# Patient Record
Sex: Male | Born: 1950
Health system: Southern US, Community
[De-identification: ages and names within clinical notes are randomized; demographics above are authoritative.]

## PROBLEM LIST (undated history)

## (undated) ENCOUNTER — Emergency Department (HOSPITAL_BASED_OUTPATIENT_CLINIC_OR_DEPARTMENT_OTHER): Admission: EM | Source: Home / Self Care

## (undated) DIAGNOSIS — I2510 Atherosclerotic heart disease of native coronary artery without angina pectoris: Secondary | ICD-10-CM

## (undated) DIAGNOSIS — Z87442 Personal history of urinary calculi: Secondary | ICD-10-CM

## (undated) DIAGNOSIS — J40 Bronchitis, not specified as acute or chronic: Secondary | ICD-10-CM

## (undated) DIAGNOSIS — E785 Hyperlipidemia, unspecified: Secondary | ICD-10-CM

## (undated) DIAGNOSIS — J189 Pneumonia, unspecified organism: Secondary | ICD-10-CM

## (undated) DIAGNOSIS — Z803 Family history of malignant neoplasm of breast: Secondary | ICD-10-CM

## (undated) DIAGNOSIS — I10 Essential (primary) hypertension: Secondary | ICD-10-CM

## (undated) DIAGNOSIS — Z800 Family history of malignant neoplasm of digestive organs: Secondary | ICD-10-CM

## (undated) DIAGNOSIS — Z8051 Family history of malignant neoplasm of kidney: Secondary | ICD-10-CM

## (undated) DIAGNOSIS — C61 Malignant neoplasm of prostate: Secondary | ICD-10-CM

## (undated) HISTORY — PX: HERNIA REPAIR: SHX51

## (undated) HISTORY — DX: Atherosclerotic heart disease of native coronary artery without angina pectoris: I25.10

## (undated) HISTORY — PX: PROSTATE BIOPSY: SHX241

## (undated) HISTORY — DX: Essential (primary) hypertension: I10

## (undated) HISTORY — DX: Hyperlipidemia, unspecified: E78.5

## (undated) HISTORY — DX: Family history of malignant neoplasm of breast: Z80.3

## (undated) HISTORY — DX: Family history of malignant neoplasm of kidney: Z80.51

## (undated) HISTORY — DX: Family history of malignant neoplasm of digestive organs: Z80.0

---

## 1999-04-18 ENCOUNTER — Encounter: Payer: Self-pay | Admitting: Urology

## 1999-04-18 ENCOUNTER — Ambulatory Visit (HOSPITAL_COMMUNITY): Admission: RE | Admit: 1999-04-18 | Discharge: 1999-04-18 | Payer: Self-pay | Admitting: Urology

## 1999-04-22 ENCOUNTER — Emergency Department (HOSPITAL_COMMUNITY): Admission: EM | Admit: 1999-04-22 | Discharge: 1999-04-22 | Payer: Self-pay | Admitting: Internal Medicine

## 1999-04-22 ENCOUNTER — Encounter: Payer: Self-pay | Admitting: Emergency Medicine

## 1999-04-23 ENCOUNTER — Encounter: Payer: Self-pay | Admitting: Urology

## 1999-04-24 ENCOUNTER — Inpatient Hospital Stay (HOSPITAL_COMMUNITY): Admission: EM | Admit: 1999-04-24 | Discharge: 1999-04-25 | Payer: Self-pay | Admitting: Emergency Medicine

## 1999-04-24 ENCOUNTER — Encounter: Payer: Self-pay | Admitting: Urology

## 1999-05-28 ENCOUNTER — Encounter: Payer: Self-pay | Admitting: Urology

## 1999-05-28 ENCOUNTER — Ambulatory Visit (HOSPITAL_COMMUNITY): Admission: RE | Admit: 1999-05-28 | Discharge: 1999-05-28 | Payer: Self-pay | Admitting: Urology

## 1999-06-15 ENCOUNTER — Ambulatory Visit (HOSPITAL_COMMUNITY): Admission: RE | Admit: 1999-06-15 | Discharge: 1999-06-15 | Payer: Self-pay | Admitting: Urology

## 1999-06-15 ENCOUNTER — Encounter: Payer: Self-pay | Admitting: Urology

## 2000-02-13 ENCOUNTER — Encounter: Payer: Self-pay | Admitting: Emergency Medicine

## 2000-02-13 ENCOUNTER — Emergency Department (HOSPITAL_COMMUNITY): Admission: EM | Admit: 2000-02-13 | Discharge: 2000-02-13 | Payer: Self-pay | Admitting: Emergency Medicine

## 2000-02-18 ENCOUNTER — Encounter: Admission: RE | Admit: 2000-02-18 | Discharge: 2000-02-18 | Payer: Self-pay | Admitting: Urology

## 2000-02-18 ENCOUNTER — Encounter: Payer: Self-pay | Admitting: Urology

## 2000-02-21 ENCOUNTER — Encounter: Payer: Self-pay | Admitting: Urology

## 2000-02-21 ENCOUNTER — Encounter: Admission: RE | Admit: 2000-02-21 | Discharge: 2000-02-21 | Payer: Self-pay | Admitting: Urology

## 2006-01-03 ENCOUNTER — Ambulatory Visit: Payer: Self-pay | Admitting: Internal Medicine

## 2006-09-15 ENCOUNTER — Ambulatory Visit: Payer: Self-pay | Admitting: Oncology

## 2007-04-28 ENCOUNTER — Ambulatory Visit: Payer: Self-pay | Admitting: Internal Medicine

## 2007-04-28 DIAGNOSIS — R1032 Left lower quadrant pain: Secondary | ICD-10-CM

## 2007-04-29 ENCOUNTER — Encounter: Payer: Self-pay | Admitting: Internal Medicine

## 2007-04-29 LAB — CONVERTED CEMR LAB
Basophils Absolute: 0 10*3/uL (ref 0.0–0.1)
Basophils Relative: 0.1 % (ref 0.0–1.0)
Eosinophils Relative: 0.7 % (ref 0.0–5.0)
HCT: 46.6 % (ref 39.0–52.0)
Hemoglobin: 16 g/dL (ref 13.0–17.0)
MCHC: 34.3 g/dL (ref 30.0–36.0)
Monocytes Absolute: 0.9 10*3/uL — ABNORMAL HIGH (ref 0.2–0.7)
Neutrophils Relative %: 78.1 % — ABNORMAL HIGH (ref 43.0–77.0)
RDW: 12.9 % (ref 11.5–14.6)

## 2007-04-30 ENCOUNTER — Encounter (INDEPENDENT_AMBULATORY_CARE_PROVIDER_SITE_OTHER): Payer: Self-pay | Admitting: *Deleted

## 2007-05-01 ENCOUNTER — Encounter (INDEPENDENT_AMBULATORY_CARE_PROVIDER_SITE_OTHER): Payer: Self-pay | Admitting: *Deleted

## 2007-05-02 ENCOUNTER — Encounter: Payer: Self-pay | Admitting: Internal Medicine

## 2007-05-02 DIAGNOSIS — N12 Tubulo-interstitial nephritis, not specified as acute or chronic: Secondary | ICD-10-CM | POA: Insufficient documentation

## 2007-05-05 ENCOUNTER — Ambulatory Visit: Payer: Self-pay | Admitting: Internal Medicine

## 2007-05-12 ENCOUNTER — Encounter (INDEPENDENT_AMBULATORY_CARE_PROVIDER_SITE_OTHER): Payer: Self-pay | Admitting: *Deleted

## 2007-05-25 ENCOUNTER — Encounter: Payer: Self-pay | Admitting: Internal Medicine

## 2007-06-03 ENCOUNTER — Encounter: Payer: Self-pay | Admitting: Internal Medicine

## 2007-10-13 ENCOUNTER — Encounter: Payer: Self-pay | Admitting: Internal Medicine

## 2008-11-22 ENCOUNTER — Emergency Department (HOSPITAL_COMMUNITY): Admission: EM | Admit: 2008-11-22 | Discharge: 2008-11-23 | Payer: Self-pay | Admitting: Emergency Medicine

## 2009-11-10 ENCOUNTER — Ambulatory Visit: Payer: Self-pay | Admitting: Internal Medicine

## 2009-11-10 DIAGNOSIS — Z87442 Personal history of urinary calculi: Secondary | ICD-10-CM

## 2009-11-10 DIAGNOSIS — K5730 Diverticulosis of large intestine without perforation or abscess without bleeding: Secondary | ICD-10-CM | POA: Insufficient documentation

## 2009-11-10 DIAGNOSIS — K5289 Other specified noninfective gastroenteritis and colitis: Secondary | ICD-10-CM

## 2009-11-13 ENCOUNTER — Telehealth (INDEPENDENT_AMBULATORY_CARE_PROVIDER_SITE_OTHER): Payer: Self-pay | Admitting: *Deleted

## 2009-11-13 LAB — CONVERTED CEMR LAB
Basophils Absolute: 0 10*3/uL (ref 0.0–0.1)
Creatinine, Ser: 1 mg/dL (ref 0.4–1.5)
Eosinophils Absolute: 0 10*3/uL (ref 0.0–0.7)
HCT: 47 % (ref 39.0–52.0)
Lymphs Abs: 0.4 10*3/uL — ABNORMAL LOW (ref 0.7–4.0)
MCHC: 33.5 g/dL (ref 30.0–36.0)
MCV: 85.4 fL (ref 78.0–100.0)
Monocytes Absolute: 0.3 10*3/uL (ref 0.1–1.0)
Platelets: 182 10*3/uL (ref 150.0–400.0)
RDW: 13.5 % (ref 11.5–14.6)

## 2010-10-09 NOTE — Progress Notes (Signed)
Summary: Lab  Results  Phone Note Outgoing Call Call back at [facility] Phone [phone]   Call placed by: Shonna Chock,  November 13, 2009 10:04 AM Call placed to: Patient Summary of Call: Spoke with patient, patient ok'd all information below and indicated he will call back to have [place] set up colonoscopy once he feels better.  White count is normal but increased neutrophil count can occur with early bacterial infection or stress of illness. Potassium slightly low; supplement with Gatorade Light, bananas &  citrus fruits. I reviewed your 2008 abdominal CT ; diverticulitis vs colitis was suggested. Gastroenterology attempted to schedule a colonoscopy for you in 2010. I recommend you schedule this 8-12 weeks after this illness resolves to assess the entire colon , but especially the left colon based on that report. Liver & kidney tests were normal.Hopp  Bobby [name]  November 13, 2009 10:09 AM

## 2010-10-09 NOTE — Assessment & Plan Note (Signed)
Summary: diahrea/congestion/kdc   Vital Signs:  Patient profile:   60 year old male Weight:      244 pounds Temp:     100.0 degrees F oral Resp:     16 per minute BP sitting:   120 / 70  (left arm)  Vitals Entered By: Doristine Devoid (November 10, 2009 12:25 PM) CC: fever, chills, bodyaches, also NVD xtues. got tamiflu and has been taking no relief , Diarrhea   CC:  fever, chills, bodyaches, also NVD xtues. got tamiflu and has been taking no relief , and Diarrhea.  History of Present Illness:  Diarrhea      This is a 60 year old man who presents with Diarrhea X 3 days .  The patient reports watery/unformed stools, mucus in stool, greasy stools, malodorous stools, fecal urgency, fasting diarrhea, bloating, gassiness, and abrupt onset of symptoms,  3 stools or less per dayHe denies voluminous stools, blood in stool, fecal soiling, alternating diarrhea/constipation, and nocturnal diarrhea.  Associated symptoms include fever (temp to 101)abdominal cramps, nausea, vomiting, lightheadedness, increased thirst, and joint pains.  The patient denies abdominal pain, weight loss, mouth ulcers, and eye redness.  Patient's risk factors for diarrhea include recent hospitalization visitation .  Patient has a  history of asymptomatic diverticulosis.  Onset of illness was actually URI / "cold" 8 days ago which resolved after 3 days. Leftover (exp date 10/10) Tamiflu X 3 days. Flu shot taken 05/2009.  Preventive Screening-Counseling & Management  Alcohol-Tobacco     Smoking Status: quit  Allergies: No Known Drug Allergies  Past History:  Past Medical History: Diverticulosis, colon as incidental finding ( as per CT for renal calculi) Nephrolithiasis, hx of X1, Dr Isabel Caprice  Past Surgical History: Lithotripsy X2 with stone retrieval  Family History: Father: diverticulitis, partial colectomy Mother: neg Siblings: neg  Social History: Patient is a former smoke: quit 2006.  Smoking Status:   quit  Review of Systems General:  Complains of fatigue. GU:  Denies discharge, dysuria, and hematuria.  Physical Exam  General:  Appears tired,well-nourished,in no acute distress; appropriate and cooperative throughout examination Eyes:  No corneal or conjunctival inflammation noted.Perrla. No icterus  Mouth:  Oral mucosa and oropharynx without lesions or exudates.  Teeth in good repair. Tongue slightly dry Lungs:  Normal respiratory effort, chest expands symmetrically. Lungs are clear to auscultation, no crackles or wheezes. Heart:  S4 gallop and grade 1 /6 systolic murmur @ base.   Abdomen:  Bowel sounds positive,abdomen soft and non-tender without masses, organomegaly or hernias noted. Pulses:  R and L carotid,radial,dorsalis pedis and posterior tibial pulses are full and equal bilaterally Extremities:  No clubbing, cyanosis, edema. Neurologic:  alert & oriented X3.   Skin:  Intact without suspicious lesions or rashes.No jaundice . Slight tenting Cervical Nodes:  No lymphadenopathy noted Axillary Nodes:  No palpable lymphadenopathy Psych:  memory intact for recent and remote and subdued.     Impression & Recommendations:  Problem # 1:  GASTROENTERITIS (ICD-558.9)  Orders: Venipuncture (04540) TLB-CBC Platelet - w/Differential (85025-CBCD) TLB-Creatinine, Blood (82565-CREA) TLB-Potassium (K+) (84132-K) TLB-BUN (Urea Nitrogen) (84520-BUN) TLB-ALT (SGPT) (84460-ALT) TLB-AST (SGOT) (84450-SGOT)  Problem # 2:  DIARRHEA (ICD-787.91)  Orders: Venipuncture (98119) TLB-CBC Platelet - w/Differential (85025-CBCD) TLB-Creatinine, Blood (82565-CREA) TLB-Potassium (K+) (84132-K) TLB-BUN (Urea Nitrogen) (84520-BUN)  Patient Instructions: 1)  Align once daily until bowels are normal. Use Pepto Bismol as needed liquid stool. Fill Rx for Metronidazole & Lomotil 2.5 mg if no better in < 24 hrs.  2)  Drink clear liquids only for the next 24 hours, then slowly add other liquids and food  as you  tolerate them. 3)  Take 650-1000mg  of Tylenol every 4-6 hours as needed for relief of pain or comfort of fever AVOID taking more than 4000mg   in a 24 hour period (can cause liver damage in higher doses) OR take 400-600mg  of Ibuprofen (Advil, Motrin) with food every 4-6 hours as needed for relief of pain or comfort of fever.

## 2012-09-07 ENCOUNTER — Telehealth: Payer: Self-pay | Admitting: Internal Medicine

## 2012-09-07 NOTE — Telephone Encounter (Signed)
Please advise 

## 2012-09-07 NOTE — Telephone Encounter (Signed)
Can go to UC or be seen tomorrow

## 2012-09-07 NOTE — Telephone Encounter (Signed)
Patient Information:  Caller Name: Gleb  Phone: [phone]  Patient: Bobby [name]  Gender: Male  DOB: 1950/11/15  Age: 61 Years  PCP: Marga Melnick  Office Follow Up:  Does the office need to follow up with this patient?: Yes  Instructions For The Office: Appt needed for See in Office Within 4 Hours' disposition krs/can  RN Note:  Sinus pressure, right sided earache.  Per protocol, disposition See Within 4 Hours; no appts available in epic.  INfo to office for staff management of appt need.  May reach patient [phone].  Symptoms  Reason For Call & Symptoms: cough, earache, nasal congestion  Reviewed Health History In EMR: Yes  Reviewed Medications In EMR: Yes  Reviewed Allergies In EMR: Yes  Reviewed Surgeries / Procedures: Yes  Date of Onset of Symptoms: 09/05/2012  Guideline(s) Used:  Colds  Disposition Per Guideline:   See Today in Office  Reason For Disposition Reached:   Earache  Advice Given:  N/A

## 2012-09-08 ENCOUNTER — Encounter: Payer: Self-pay | Admitting: Family Medicine

## 2012-09-08 ENCOUNTER — Ambulatory Visit (INDEPENDENT_AMBULATORY_CARE_PROVIDER_SITE_OTHER): Payer: BC Managed Care – PPO | Admitting: Family Medicine

## 2012-09-08 VITALS — BP 140/60 | HR 87 | Temp 98.3°F | Ht 67.75 in | Wt 253.0 lb

## 2012-09-08 DIAGNOSIS — H6690 Otitis media, unspecified, unspecified ear: Secondary | ICD-10-CM

## 2012-09-08 DIAGNOSIS — H109 Unspecified conjunctivitis: Secondary | ICD-10-CM

## 2012-09-08 DIAGNOSIS — J329 Chronic sinusitis, unspecified: Secondary | ICD-10-CM | POA: Insufficient documentation

## 2012-09-08 MED ORDER — AMOXICILLIN-POT CLAVULANATE 875-125 MG PO TABS: 1.0000 | ORAL_TABLET | Freq: Two times a day (BID) | ORAL | Status: DC

## 2012-09-08 MED ORDER — GUAIFENESIN-CODEINE 100-10 MG/5ML PO SYRP: 10.0000 mL | ORAL_SOLUTION | Freq: Three times a day (TID) | ORAL | Status: DC | PRN

## 2012-09-08 NOTE — Patient Instructions (Addendum)
You've got the complete package- R ear infection, sinus infection, pink eye and bronchitis Start the Augmentin twice daily- take w/ food Use the cough syrup as needed- it will make you drowsy Take Mucinex DM (available OTC) for daytime cough Drink plenty of fluids REST! If no improvement or symptoms worsen- please call! Hang in there! Happy New Year!

## 2012-09-08 NOTE — Telephone Encounter (Signed)
[facility] Triage Call Report Triage Record Num: [id number] Operator: Tomasita Crumble Patient Name: Bobby [name] Call Date & Time: 09/07/2012 6:54:40PM Patient Phone: [phone] PCP: Patient Gender: Male PCP Fax : Patient DOB: 26-Dec-1950 Practice Name: [facility] Reason for Call: Caller: [name]/Patient; PCP: Marga Melnick; CB#: [phone]; Call regarding Follow up to previous call this date. ; Reviewed EMR and saw from phone call 09/07/12 that Triage showed disposition of see in 4 hours. MD response in EMR is see in UC or office on 12/31. Caller informed of same. Right earache and sinus pain reported. All appointments with Dr. Alwyn Ren are full for 09/08/12. Headache rated at 2 of 10. Emergent symptoms ruled out. Home care and parameters for callback per URI protocol. Appointment with Dr. Beverely Low for 11:00on 09/08/12. Protocol(s) Used: Upper Respiratory Infection (URI) Recommended Outcome per Protocol: See Provider within 24 hours Reason for Outcome: Facial pain (fullness, pressure, worsens with bending over), frontal headache, yellow-green nasal discharge AND any temperature elevation Care Advice: ~ Use a cool mist humidifier to moisten air. Be sure to clean according to manufacturer's instructions. ~ SYMPTOM / CONDITION MANAGEMENT A warm, moist compress placed on face, over eyes for 15 to 20 minutes, 5 to 6 times a day, may help relieve the congestion. ~ Analgesic/Antipyretic Advice - Acetaminophen: Consider acetaminophen as directed on label or by pharmacist/provider for pain or fever PRECAUTIONS: - Use if there is no history of liver disease, alcoholism, or intake of three or more alcohol drinks per day - Only if approved by provider during pregnancy or when breastfeeding - During pregnancy, acetaminophen should not be taken more than 3 consecutive days without telling provider - Do not exceed recommended dose or frequency ~ If pregnancy is known or suspected,  get advice from provider before using any nonprescription medication other than acetaminophen

## 2012-09-08 NOTE — Progress Notes (Signed)
  Subjective:    Patient ID: Bobby [name], male    DOB: [birth year], 61 y.o.   MRN: [id number]  HPI URI- sxs started 4-5 days ago w/ sinus pressure, nasal congestion, drainage.  No fever.  + ear pain, R>L.  + cough- productive of yellow sputum.  + sore throat.  No N/V/D.  + sick contacts.  R eye feeling gritty, watery since yesterday.  NKDA   Review of Systems For ROS see HPI     Objective:   Physical Exam  Vitals reviewed. Constitutional: He appears well-developed and well-nourished. No distress.  HENT:  Head: Normocephalic and atraumatic.  Mouth/Throat: Oropharynx is clear and moist. No oropharyngeal exudate.       R TM dull, erythematous, poor landmarks L TM retracted but otherwise WNL + TTP over frontal and maxillary sinuses  Eyes: EOM are normal. Pupils are equal, round, and reactive to light.       R conjunctivitis  Neck: Normal range of motion. Neck supple.  Pulmonary/Chest: Effort normal. No respiratory distress. He has no wheezes.       Coarse BS diffusely along w/ hacking cough  Lymphadenopathy:    He has no cervical adenopathy.          Assessment & Plan:

## 2012-09-08 NOTE — Assessment & Plan Note (Signed)
New.  R ear.  Start abx.  Reviewed supportive care and red flags that should prompt return.  Pt expressed understanding and is in agreement w/ plan. r

## 2012-09-08 NOTE — Assessment & Plan Note (Signed)
New.  Most likely due to pt's current OM and sinusitis.  Oral abx should provide adequate coverage but reviewed red flags that should prompt him to call for eye drops.  Pt expressed understanding and is in agreement w/ plan.

## 2012-09-08 NOTE — Assessment & Plan Note (Signed)
New.  Start abx.  Reviewed supportive care and red flags that should prompt return.  Pt expressed understanding and is in agreement w/ plan.  

## 2017-04-10 ENCOUNTER — Other Ambulatory Visit: Payer: Self-pay | Admitting: Urology

## 2017-04-15 ENCOUNTER — Encounter (HOSPITAL_COMMUNITY): Payer: Self-pay | Admitting: *Deleted

## 2017-04-16 NOTE — H&P (Signed)
Bobby [name] is a 66 yo WM who is sent in consultation by Dr. Alroy Dust for a PSA of 11.5 on 02/13/17. His PSA prior 2008 was about 1. He has LUTs with nocturia x 2. He has some urgency and frequency q3hrs. He has a reduced stream. He has been on tamsulosin for 2 months and that has helped. He has rare dysuria. He has had no gross hematuria. He has had a history of stones and over the last 3 months he has had severe right flank pain with some nausea. HE had a CT in 2008 and had some some right renal stones. He has had no recent imaging.    ALLERGIES: No Allergies    MEDICATIONS: Flomax 0.4 mg capsule, ext release 24 hr Oral     GU PSH: Cysto Uretero Lithotripsy      PSH Notes: kidney stone removal     NON-GU PSH: None   GU PMH: Renal calculus, Nephrolithiasis - 2014    NON-GU PMH: Cardiac murmur, unspecified, Murmurs - 2014 Encounter for general adult medical examination without abnormal findings, Encounter for preventive health examination    FAMILY HISTORY: Diabetes - Father Heart problem - Father, Mother nephrolithiasis - Mother   SOCIAL HISTORY: Marital Status: Married Preferred Language: English; Race: White Current Smoking Status: Patient smokes.   Tobacco Use Assessment Completed: Used Tobacco in last 30 days? Drinks 2 caffeinated drinks per day. Patient's occupation Building services engineer.     Notes: 1 son, 1 daughter    REVIEW OF SYSTEMS:    GU Review Male:   Patient reports frequent urination, hard to postpone urination, burning/ pain with urination, get up at night to urinate, leakage of urine, and erection problems. Patient denies stream starts and stops, trouble starting your stream, have to strain to urinate , and penile pain.  Gastrointestinal (Upper):   Patient denies nausea, vomiting, and indigestion/ heartburn.  Gastrointestinal (Lower):   Patient denies diarrhea and constipation.  Constitutional:   Patient denies fever, night sweats, weight loss, and fatigue.   Skin:   Patient denies skin rash/ lesion and itching.  Eyes:   Patient denies blurred vision and double vision.  Ears/ Nose/ Throat:   Patient denies sore throat and sinus problems.  Hematologic/Lymphatic:   Patient denies swollen glands and easy bruising.  Cardiovascular:   Patient denies leg swelling and chest pains.  Respiratory:   Patient denies cough and shortness of breath.  Endocrine:   Patient denies excessive thirst.  Musculoskeletal:   Patient denies back pain and joint pain.  Neurological:   Patient denies headaches and dizziness.  Psychologic:   Patient denies depression and anxiety.   VITAL SIGNS:   Weight 240 lb / 108.86 kg  Height 67 in / 170.18 cm  BP 176/80 mmHg  Pulse 78 /min  Temperature 98.8 F / 37.1 C  BMI 37.6 kg/m   GU PHYSICAL EXAMINATION:    Anus and Perineum: No hemorrhoids. No anal stenosis. No rectal fissure, no anal fissure. No edema, no dimple, no perineal tenderness, no anal tenderness.  Scrotum: No lesions. No edema. No cysts. No warts.  Epididymides: Right: no spermatocele, no masses, no cysts, no tenderness, no induration, no enlargement. Left: no spermatocele, no masses, no cysts, no tenderness, no induration, no enlargement.  Testes: No tenderness, no swelling, no enlargement left testes. No tenderness, no swelling, no enlargement right testes. Normal location left testes. Normal location right testes. No mass, no cyst, no varicocele, no hydrocele left testes. No  mass, no cyst, no varicocele, no hydrocele right testes.  Urethral Meatus: Normal size. No lesion, no wart, no discharge, no polyp. Normal location.  Penis: Circumcised, no warts, no cracks. No dorsal Peyronie's plaques, no left corporal Peyronie's plaques, no right corporal Peyronie's plaques, no scarring, no warts. No balanitis, no meatal stenosis.  Prostate: Prostate 2 + size. Left lobe firm, right lobe firm. Symmetrical lobes. No prostate nodule. Left lobe no tenderness, right lobe no  tenderness.   Seminal Vesicles: Nonpalpable.  Sphincter Tone: Normal sphincter. No rectal tenderness. No rectal mass.    MULTI-SYSTEM PHYSICAL EXAMINATION:    Constitutional: Obese. No physical deformities. Normally developed. Good grooming.   Neck: Neck symmetrical, not swollen. Normal tracheal position.  Respiratory: No labored breathing, no use of accessory muscles. CTA  Cardiovascular: Normal temperature, RRR without murmur  Lymphatic: No enlargement of neck, axillae, groin.  Skin: No paleness, no jaundice, no cyanosis. No lesion, no ulcer, no rash.  Neurologic / Psychiatric: Oriented to time, oriented to place, oriented to person. No depression, no anxiety, no agitation.  Gastrointestinal: Obese abdomen. No hernia. No mass, no tenderness, no rigidity.   Musculoskeletal: Normal gait and station of head and neck.     PAST DATA REVIEWED:  Source Of History:  Patient  Lab Test Review:   PSA  Records Review:   Previous Doctor Records, Previous Patient Records  Urine Test Review:   Urinalysis  X-Ray Review: [facility] Protocol: Reviewed Films. Discussed With Patient. 2008.   Notes:                     Records from Dr. Alroy Dust and our prior office notes reviewed.    PROCEDURES:         KUB - [id number]  A single view of the abdomen is obtained. There is a 10 x 12 mm stone at L4 on the right. No renal stones are seen and there are no significant bone, gas or soft tissue abnormalities.                Urinalysis w/Scope Dipstick Dipstick Cont'd Micro  Color: Yellow Bilirubin: Neg WBC/hpf: 0 - 5/hpf  Appearance: Clear Ketones: Trace RBC/hpf: 0 - 2/hpf  Specific Gravity: 1.025 Blood: 2+ Bacteria: NS (Not Seen)  pH: 5.5 Protein: Neg Cystals: NS (Not Seen)  Glucose: Neg Urobilinogen: 0.2 Casts: NS (Not Seen)    Nitrites: Neg Trichomonas: Not Present    Leukocyte Esterase: Neg Mucous: Present      Epithelial Cells: NS (Not Seen)      Yeast: NS (Not Seen)      Sperm: Not Present     ASSESSMENT/PLAN:       ICD-10 Details  1 GU:   Elevated PSA - R97.20 He has a PSA of 11.5 with an indurated prostate. I am going to get a PSA today but will go ahead and get him set up for a prostate [place] and biopsy. I reviewed the risks of bleeding, infection, and difficulty voiding. Levaquin script given.   2   Ureteral calculus - N20.1 He has a 10x72mm right proximal stone with intermittent pain. I reviewed the options for management including PCNL, URS and ESWL. I think it is too big to reliably pass. I am going to get him set up for ESWL and reviewed the risks of bleeding, infection, injury to the kidney and adjacent structures, failure of fragmentation, need for secondary procedures, thrombotic events and sedation risks.

## 2017-04-16 NOTE — Discharge Instructions (Signed)
Lithotripsy, Care After °This sheet gives you information about how to care for yourself after your procedure. Your health care provider may also give you more specific instructions. If you have problems or questions, contact your health care provider. °What can I expect after the procedure? °After the procedure, it is common to have: °· Some blood in your urine. This should only last for a few days. °· Soreness in your back, sides, or upper abdomen for a few days. °· Blotches or bruises on your back where the pressure wave entered the skin. °· Pain, discomfort, or nausea when pieces (fragments) of the kidney stone move through the tube that carries urine from the kidney to the bladder (ureter). Stone fragments may pass soon after the procedure, but they may continue to pass for up to 4-8 weeks. °? If you have severe pain or nausea, contact your health care provider. This may be caused by a large stone that was not broken up, and this may mean that you need more treatment. °· Some pain or discomfort during urination. °· Some pain or discomfort in the lower abdomen or (in men) at the base of the penis. ° °Follow these instructions at home: °Medicines °· Take over-the-counter and prescription medicines only as told by your health care provider. °· If you were prescribed an antibiotic medicine, take it as told by your health care provider. Do not stop taking the antibiotic even if you start to feel better. °· Do not drive for 24 hours if you were given a medicine to help you relax (sedative). °· Do not drive or use heavy machinery while taking prescription pain medicine. °Eating and drinking °· Drink enough water and fluids to keep your urine clear or pale yellow. This helps any remaining pieces of the stone to pass. It can also help prevent new stones from forming. °· Eat plenty of fresh fruits and vegetables. °· Follow instructions from your health care provider about eating and drinking restrictions. You may be  instructed: °? To reduce how much salt (sodium) you eat or drink. Check ingredients and nutrition facts on packaged foods and beverages. °? To reduce how much meat you eat. °· Eat the recommended amount of calcium for your age and gender. Ask your health care provider how much calcium you should have. °General instructions °· Get plenty of rest. °· Most people can resume normal activities 1-2 days after the procedure. Ask your health care provider what activities are safe for you. °· If directed, strain all urine through the strainer that was provided by your health care provider. °? Keep all fragments for your health care provider to see. Any stones that are found may be sent to a medical lab for examination. The stone may be as small as a grain of salt. °· Keep all follow-up visits as told by your health care provider. This is important. °Contact a health care provider if: °· You have pain that is severe or does not get better with medicine. °· You have nausea that is severe or does not go away. °· You have blood in your urine longer than your health care provider told you to expect. °· You have more blood in your urine. °· You have pain during urination that does not go away. °· You urinate more frequently than usual and this does not go away. °· You develop a rash or any other possible signs of an allergic reaction. °Get help right away if: °· You have severe pain in   your back, sides, or upper abdomen. °· You have severe pain while urinating. °· Your urine is very dark red. °· You have blood in your stool (feces). °· You cannot pass any urine at all. °· You feel a strong urge to urinate after emptying your bladder. °· You have a fever or chills. °· You develop shortness of breath, difficulty breathing, or chest pain. °· You have severe nausea that leads to persistent vomiting. °· You faint. °Summary °· After this procedure, it is common to have some pain, discomfort, or nausea when pieces (fragments) of the  kidney stone move through the tube that carries urine from the kidney to the bladder (ureter). If this pain or nausea is severe, however, you should contact your health care provider. °· Most people can resume normal activities 1-2 days after the procedure. Ask your health care provider what activities are safe for you. °· Drink enough water and fluids to keep your urine clear or pale yellow. This helps any remaining pieces of the stone to pass, and it can help prevent new stones from forming. °· If directed, strain your urine and keep all fragments for your health care provider to see. Fragments or stones may be as small as a grain of salt. °· Get help right away if you have severe pain in your back, sides, or upper abdomen or have severe pain while urinating. °This information is not intended to replace advice given to you by your health care provider. Make sure you discuss any questions you have with your health care provider. °Document Released: 09/15/2007 Document Revised: 07/17/2016 Document Reviewed: 07/17/2016 °Elsevier Interactive Patient Education © 2017 [employer] ° °

## 2017-04-17 ENCOUNTER — Ambulatory Visit (HOSPITAL_COMMUNITY): Payer: Managed Care, Other (non HMO)

## 2017-04-17 ENCOUNTER — Encounter (HOSPITAL_COMMUNITY): Payer: Self-pay | Admitting: General Practice

## 2017-04-17 ENCOUNTER — Encounter (HOSPITAL_COMMUNITY)
Admission: RE | Disposition: A | Discharge status: Home / Self Care | Payer: Self-pay | Source: Ambulatory Visit | Attending: Urology

## 2017-04-17 ENCOUNTER — Ambulatory Visit (HOSPITAL_COMMUNITY)
Admission: RE | Admit: 2017-04-17 | Discharge: 2017-04-17 | Disposition: A | Discharge status: Home / Self Care | Payer: Managed Care, Other (non HMO) | Source: Ambulatory Visit | Attending: Urology | Admitting: Urology

## 2017-04-17 DIAGNOSIS — Z6839 Body mass index (BMI) 39.0-39.9, adult: Secondary | ICD-10-CM | POA: Diagnosis not present

## 2017-04-17 DIAGNOSIS — F17200 Nicotine dependence, unspecified, uncomplicated: Secondary | ICD-10-CM | POA: Diagnosis not present

## 2017-04-17 DIAGNOSIS — E669 Obesity, unspecified: Secondary | ICD-10-CM | POA: Insufficient documentation

## 2017-04-17 DIAGNOSIS — R972 Elevated prostate specific antigen [PSA]: Secondary | ICD-10-CM | POA: Diagnosis not present

## 2017-04-17 DIAGNOSIS — N202 Calculus of kidney with calculus of ureter: Secondary | ICD-10-CM | POA: Diagnosis not present

## 2017-04-17 DIAGNOSIS — N201 Calculus of ureter: Secondary | ICD-10-CM

## 2017-04-17 HISTORY — PX: EXTRACORPOREAL SHOCK WAVE LITHOTRIPSY: SHX1557

## 2017-04-17 HISTORY — DX: Personal history of urinary calculi: Z87.442

## 2017-04-17 SURGERY — LITHOTRIPSY, ESWL
Anesthesia: LOCAL | Laterality: Right

## 2017-04-17 MED ORDER — DIAZEPAM 5 MG PO TABS
10.0000 mg | ORAL_TABLET | ORAL | Status: AC
  Administered 2017-04-17: 10 mg via ORAL
  Filled 2017-04-17: qty 2

## 2017-04-17 MED ORDER — OXYCODONE HCL 10 MG PO TABS: 10.0000 mg | ORAL_TABLET | ORAL | 0 refills | Status: DC | PRN

## 2017-04-17 MED ORDER — TAMSULOSIN HCL 0.4 MG PO CAPS: 0.4000 mg | ORAL_CAPSULE | ORAL | 0 refills | Status: AC

## 2017-04-17 MED ORDER — DIPHENHYDRAMINE HCL 25 MG PO CAPS
25.0000 mg | ORAL_CAPSULE | ORAL | Status: AC
  Administered 2017-04-17: 25 mg via ORAL
  Filled 2017-04-17: qty 1

## 2017-04-17 MED ORDER — SODIUM CHLORIDE 0.9 % IV SOLN
INTRAVENOUS | Status: DC
  Administered 2017-04-17: 10:00:00 via INTRAVENOUS

## 2017-04-17 MED ORDER — CIPROFLOXACIN HCL 500 MG PO TABS
500.0000 mg | ORAL_TABLET | ORAL | Status: DC
  Filled 2017-04-17: qty 1

## 2017-04-17 NOTE — Op Note (Signed)
See [facility] OP note scanned into chart. Also because of the size, density, location and other factors that cannot be anticipated I feel this will likely be a staged procedure. This fact supersedes any indication in the scanned [facility] operative note to the contrary.  

## 2017-04-18 ENCOUNTER — Encounter (HOSPITAL_COMMUNITY): Payer: Self-pay | Admitting: Urology

## 2017-05-08 ENCOUNTER — Other Ambulatory Visit: Payer: Self-pay | Admitting: Urology

## 2017-05-08 DIAGNOSIS — C61 Malignant neoplasm of prostate: Secondary | ICD-10-CM

## 2017-05-13 ENCOUNTER — Telehealth: Payer: Self-pay | Admitting: Medical Oncology

## 2017-05-13 ENCOUNTER — Encounter: Payer: Self-pay | Admitting: Medical Oncology

## 2017-05-13 NOTE — Telephone Encounter (Signed)
Left a message requesting a return call to discuss referral to Prostate MDC.

## 2017-05-15 ENCOUNTER — Encounter: Payer: Self-pay | Admitting: Medical Oncology

## 2017-05-16 NOTE — Progress Notes (Signed)
Called [facility], spoke with Mauricia Area to request prostate biopsy pathology slides for Prostate MDC.

## 2017-05-21 ENCOUNTER — Encounter (HOSPITAL_COMMUNITY)
Admission: RE | Admit: 2017-05-21 | Discharge: 2017-05-21 | Disposition: A | Discharge status: Home / Self Care | Payer: Managed Care, Other (non HMO) | Source: Ambulatory Visit | Attending: Urology | Admitting: Urology

## 2017-05-21 DIAGNOSIS — C61 Malignant neoplasm of prostate: Secondary | ICD-10-CM | POA: Insufficient documentation

## 2017-05-21 MED ORDER — TECHNETIUM TC 99M MEDRONATE IV KIT
25.0000 | PACK | Freq: Once | INTRAVENOUS | Status: AC | PRN
  Administered 2017-05-21: 22 via INTRAVENOUS

## 2017-05-22 ENCOUNTER — Encounter: Payer: Self-pay | Admitting: Radiation Oncology

## 2017-05-22 ENCOUNTER — Telehealth: Payer: Self-pay | Admitting: Medical Oncology

## 2017-05-22 DIAGNOSIS — C61 Malignant neoplasm of prostate: Secondary | ICD-10-CM | POA: Insufficient documentation

## 2017-05-22 NOTE — Progress Notes (Signed)
GU Location of Tumor / Histology: prostatic adenocarcinoma  If Prostate Cancer, Gleason Score is (4 + 5) and PSA is (14). Prostate volume: 49.42 cc  Bobby [name]. was referred by Dr. Alroy Dust to Dr. Jeffie Pollock for evaluation of an elevated PSA of 11.5 on 02/13/17.  Biopsies of prostate (if applicable) revealed:    Past/Anticipated interventions by urology, if any: yes, biopsy and referral to [facility]  Past/Anticipated interventions by medical oncology, if any: no  Weight changes, if any: no  Bowel/Bladder complaints, if any: Nocturia x 2. Urinary urgency and frequency. Weak stream. Rare dysuria.   Nausea/Vomiting, if any: no  Pain issues, if any:  no  SAFETY ISSUES:  Prior radiation? no  Pacemaker/ICD? no  Possible current pregnancy? no  Is the patient on methotrexate? no  Current Complaints / other details:  66 year old male. Married with one son and one daughter. Works in [employer].

## 2017-05-22 NOTE — Telephone Encounter (Signed)
Confirmed appointment for Prostate MDC. Reminded him to bring his complete medical forms, reviewed the registration process and valet parking. He voiced understanding.

## 2017-05-22 NOTE — Progress Notes (Signed)
I called pt to introduce myself as the Prostate Nurse Navigator and the Coordinator of the Prostate [place].  1. I confirmed with the patient he is aware of his referral to the clinic 05/23/17 arriving at 7:45am.  2. I discussed the format of the clinic and the physicians he will be seeing that day.  3. I discussed where the clinic is located and how to contact me.  4. I confirmed his address and informed him I would be mailing a packet of information and forms to be completed. I asked him to bring them with him the day of his appointment.   He voiced understanding of the above. I asked him to call me if he has any questions or concerns regarding his appointments or the forms he needs to complete.

## 2017-05-23 ENCOUNTER — Encounter: Payer: Self-pay | Admitting: Medical Oncology

## 2017-05-23 ENCOUNTER — Telehealth: Payer: Self-pay

## 2017-05-23 ENCOUNTER — Ambulatory Visit (HOSPITAL_BASED_OUTPATIENT_CLINIC_OR_DEPARTMENT_OTHER): Payer: Managed Care, Other (non HMO) | Admitting: Oncology

## 2017-05-23 ENCOUNTER — Encounter: Payer: Self-pay | Admitting: Radiation Oncology

## 2017-05-23 ENCOUNTER — Ambulatory Visit
Admission: RE | Admit: 2017-05-23 | Discharge: 2017-05-23 | Disposition: A | Discharge status: Home / Self Care | Payer: Managed Care, Other (non HMO) | Source: Ambulatory Visit | Attending: Radiation Oncology | Admitting: Radiation Oncology

## 2017-05-23 DIAGNOSIS — Z79899 Other long term (current) drug therapy: Secondary | ICD-10-CM | POA: Insufficient documentation

## 2017-05-23 DIAGNOSIS — Z809 Family history of malignant neoplasm, unspecified: Secondary | ICD-10-CM | POA: Diagnosis not present

## 2017-05-23 DIAGNOSIS — C61 Malignant neoplasm of prostate: Secondary | ICD-10-CM | POA: Diagnosis not present

## 2017-05-23 DIAGNOSIS — F17210 Nicotine dependence, cigarettes, uncomplicated: Secondary | ICD-10-CM | POA: Diagnosis not present

## 2017-05-23 HISTORY — DX: Malignant neoplasm of prostate: C61

## 2017-05-23 NOTE — Consult Note (Signed)
Multi-Disciplinary Clinic     05/23/2017       CC/HPI: CC: Prostate Cancer     Physician requesting consult: Dr. Irine Seal   PCP: Dr. Donnie Coffin   Location of consult: [facility] - Prostate Cancer Multidisciplinary Clinic     Bobby [name] is a 66 year old gentleman who was noted to have an elevated PSA of 14.7 prompting a TRUS biopsy of the prostate on 04/24/17 that confirmed Gleason 4+5=9 adenocarcinoma of the prostate with 10 out of 10 biopsies positive for malignancy with evidence of extraprostatic extension on biopsy.     Family history: None.     Imaging studies:   CT abd and pelvis (05/21/17): Sigmoid mass concerning for possible malignancy, Multiple enlarged lymph nodes of the left periaortic region, bilateral common iliac region, and left obturator region suspicious for metastatic prostate cancer.   Bone scan (05/21/17): Negative for metastatic disease.     PMH: He has a history of urolithiasis and obesity (245 lbs).   PSH: No abdominal surgeries.     TNM stage: cT3a Nx Mx   PSA: 14.7   Gleason score: 4+5=9   Biopsy (04/24/17): 10/10 cores positive   Left: L lateral apex (90%, 4+5=9), L lateral mid (95%, 4+3=7), L mid (90%, 4+5=9, PNI), L lateral base (90%, 4+5=9, PNI), L base (90%, 4+5=9)   Right: R lateral apex (60%, 4+5=9), R mid (95%, 4+5=9, PNI, EPE), R lateral mid (90%, 4+5=9, PNI), R base(80%, 4+5=9, PNI, EPE), R lateral base (90%, 4+4=8)   Prostate volume: 49.4 cc     Urinary function: IPSS is 20. His symptoms have improved on tamsulosin which he started taking for his recent kidney stone but has improved his voiding significantly.   Erectile function: SHIM score is 7.        ALLERGIES: No Allergies       MEDICATIONS: Levaquin 750 mg tablet 1 tablet PO Q1H prior to procedure   Flomax 0.4 mg capsule, ext release 24 hr Oral   Hydrocodone-Acetaminophen 5 mg-325 mg tablet 1 tablet PO Q 6 H PRN        GU PSH: Cysto Uretero Lithotripsy  ESWL -  04/17/2017  Locm 300-399Mg /Ml Iodine,1Ml - 05/21/2017  Prostate Needle Biopsy - 04/24/2017         PSH Notes: kidney stone removal        NON-GU PSH: Surgical Pathology, Gross And Microscopic Examination For Prostate Needle - 04/24/2017       GU PMH: Elevated PSA, He likely has locally advanced prostate cancer. I will call with the results and arrange f/u accordingly. - 04/24/2017, He has a PSA of 11.5 with an indurated prostate. I am going to get a PSA today but will go ahead and get him set up for a prostate [place] and biopsy. I reviewed the risks of bleeding, infection, and difficulty voiding. Levaquin script given. , - 04/09/2017  Ureteral calculus, He has a 10x75mm right proximal stone with intermittent pain. I reviewed the options for management including PCNL, URS and ESWL. I think it is too big to reliably pass. I am going to get him set up for ESWL and reviewed the risks of bleeding, infection, injury to the kidney and adjacent structures, failure of fragmentation, need for secondary procedures, thrombotic events and sedation risks. - 04/09/2017  Renal calculus, Nephrolithiasis - 2014       NON-GU PMH: Cardiac murmur, unspecified, Murmurs - 2014  Encounter for general adult  medical examination without abnormal findings, Encounter for preventive health examination       FAMILY HISTORY: Diabetes - Father  Heart problem - Father, Mother  nephrolithiasis - Mother     SOCIAL HISTORY: Marital Status: Married  Preferred Language: English; Race: White  Current Smoking Status: Patient smokes occasionally. Smokes less than 1/2 pack per day.     Tobacco Use Assessment Completed: Used Tobacco in last 30 days?  Does not use smokeless tobacco.  Had .25 drinks per day.  Does not use drugs.  Drinks 2 caffeinated drinks per day.  Has not had a blood transfusion.  Patient's occupation Psychologist, counselling.       Notes: 1 son, 1 daughter      REVIEW OF SYSTEMS:     GU Review Male:   Patient  denies frequent urination, hard to postpone urination, burning/ pain with urination, get up at night to urinate, leakage of urine, stream starts and stops, trouble starting your streams, and have to strain to urinate .   Gastrointestinal (Lower):   Patient denies diarrhea and constipation.   Gastrointestinal (Upper):   Patient denies vomiting and nausea.   Constitutional:   Patient denies fever, night sweats, weight loss, and fatigue.   Skin:   Patient denies skin rash/ lesion and itching.   Eyes:   Patient denies blurred vision and double vision.   Ears/ Nose/ Throat:   Patient denies sore throat and sinus problems.   Hematologic/Lymphatic:   Patient denies swollen glands and easy bruising.   Cardiovascular:   Patient denies leg swelling and chest pains.   Respiratory:   Patient denies cough and shortness of breath.   Endocrine:   Patient denies excessive thirst.   Musculoskeletal:   Patient denies back pain and joint pain.   Neurological:   Patient denies headaches and dizziness.   Psychologic:   Patient denies depression and anxiety.     VITAL SIGNS: None     MULTI-SYSTEM PHYSICAL EXAMINATION:     Constitutional: Well-nourished. No physical deformities. Normally developed. Good grooming.        PAST DATA REVIEWED:   Source Of History:  Patient   Lab Test Review:   PSA   Records Review:   Pathology Reports, Previous Patient Records   X-Ray Review: C.T. Abdomen/Pelvis: Reviewed Films.   Bone Scan: Reviewed Films.       04/09/17   PSA   Total PSA 14.70 ng/mL       PROCEDURES: None     ASSESSMENT:       ICD-10 Details   1 GU:   Prostate Cancer - C61      PLAN:             Document  Letter(s):  Created for Patient: Clinical Summary            Notes:   1. Prostate cancer: I had a detailed discussion with Bobby [name], his wife, and his daughter this morning. They have seen both Dr. Alen Blew and Dr. Tammi Klippel as well.     We discussed that he appears to have metastatic prostate  cancer based on his lymphadenopathy including retroperitoneal lymph node involvement. It was explained that this is likely a treatable but no curable malignancy. It was recommended that he begin systemic therapy with androgen deprivation under the care of Dr. Jeffie Pollock with plans to then follow up with Dr. Alen Blew to consider starting Zytiga as well. Dr. Tammi Klippel did discuss the potential option of radiation  therapy to treat his primary tumor. This was not a strong recommendation and will be considered later depending on his initial response to systemic therapy. We reviewed the potential side effects of systemic therapy with ADT and abiraterone today in detail. I did not recommend surgery based on his metastatic disease.     We also discussed his sigmoid colon findings that are concerning for a possible colon malignancy. Bobby [name] denies blood in his stool. He has never undergone a colonoscopy. He will plan to contact Dr. Alroy Dust to discuss the need for a referral to gastroenterology for a colonoscopy.     All questions were answered to his stated satisfaction today. I will communicate this plan to Dr. Jeffie Pollock so that he can start ADT next week. Dr. Alen Blew is scheduling follow up at the cancer center as well to start Zytiga in the next couple of months.     cc: Dr. Irine Seal   Dr. Donnie Coffin   Dr. Zola Button   Dr. Tyler Pita        E & M CODE: I spent at least 35 minutes face to face with the patient, more than 50% of that time was spent on counseling and/or coordinating care.

## 2017-05-23 NOTE — Progress Notes (Signed)
Reason for Referral: Prostate cancer   HPI: 66 year old gentleman currently of [place] where he lived the majority of his life. He is a gentleman is reasonable health without any significant comorbid conditions. He does have history of kidney stones and had undergone lithotripsy in the past. He is started developing lower urinary tract symptoms including urgency and nocturia. He was evaluated by Dr. Alroy Dust his primary care provider and found to have an elevated PSA of 11.5. Repeat PSA was 14.7 by Dr. Jeffie Pollock. Based on these findings he underwent a prostate biopsy on 04/24/2017. He is a biopsy showed a high volume high-grade disease with Gleason score 4+5 = 9. He had 10 cores involvement of his cancer. He was started on Flomax with improvement in his symptoms. He underwent staging workup including a bone scan which showed no evidence of disease. His CT scan showed diverticulitis involving the sigmoid colon as well as enlarged retroperitoneal and pelvic adenopathy. The retroperitoneal lymph node measuring 21 x 10 mm as well as a left common iliac artery lymph node measuring 12 mm. Clinically, he reports no abdominal pain or discomfort. He denied any diarrhea. He denied any fevers or chills. He remains active and continues to work full time.  He does not report any headaches, blurry vision, syncope or seizures. He does not report any fevers, chills, sweats or weight loss. He does not report any chest pain, palpitation, orthopnea or leg edema. He does not report any cough, wheezing or hemoptysis. He does not report any nausea, vomiting or abdominal pain. He does not report any hematochezia or melena. He does not report any hematuria or dysuria. He does report some nocturia which improved. He does report frequency at times. Remaining review of systems unremarkable.   Past Medical History:  Diagnosis Date  . History of kidney stones   . Prostate cancer [facility])   :  Past Surgical History:  Procedure  Laterality Date  . EXTRACORPOREAL SHOCK WAVE LITHOTRIPSY Right 04/17/2017   Procedure: RIGHT EXTRACORPOREAL SHOCK WAVE LITHOTRIPSY (ESWL);  Surgeon: Kathie Rhodes, MD;  Location: [facility] ORS;  Service: Urology;  Laterality: Right;  . HERNIA REPAIR     age 44  . PROSTATE BIOPSY    :   Current Outpatient Prescriptions:  .  lidocaine (LIDODERM) 5 %, Place 1 patch onto the skin daily. Remove & Discard patch within 12 hours or as directed by MD, Disp: , Rfl:  .  Oxycodone HCl 10 MG TABS, Take 1 tablet (10 mg total) by mouth every 4 (four) hours as needed. (Patient not taking: Reported on 05/23/2017), Disp: 20 tablet, Rfl: 0 .  tamsulosin (FLOMAX) 0.4 MG CAPS capsule, Take 1 capsule (0.4 mg total) by mouth daily after supper., Disp: 30 capsule, Rfl: 0:  No Known Allergies:  Family History  Problem Relation Age of Onset  . Cancer Sister        breast  :  Social History   Social History  . Marital status: Married    Spouse name: N/A  . Number of children: N/A  . Years of education: N/A   Occupational History  . Not on file.   Social History Main Topics  . Smoking status: Current Some Day Smoker    Packs/day: 0.25    Years: 10.00    Types: Cigarettes  . Smokeless tobacco: Never Used     Comment: pt states is currently trying to stop   . Alcohol use No  . Drug use: No  . Sexual activity: Yes  Other Topics Concern  . Not on file   Social History Narrative  . No narrative on file  :  Pertinent items are noted in HPI.  Exam: ECOG 0  General appearance: alert and cooperative appeared without distress. Throat: No oral thrush or ulcers. Neck: no adenopathy Back: negative Resp: clear to auscultation bilaterally no dullness to percussion. Cardio: regular rate and rhythm, S1, S2 normal, no murmur, click, rub or gallop GI: soft, non-tender; bowel sounds normal; no masses,  no organomegaly Extremities: extremities normal, atraumatic, no cyanosis or edema Skin: Skin color, texture,  turgor normal. No rashes or lesions  neurological examination: No deficits.    Nm Bone Scan Whole Body  Result Date: 05/21/2017 CLINICAL DATA:  Reason diagnosis of prostate malignancy EXAM: NUCLEAR MEDICINE WHOLE BODY BONE SCAN TECHNIQUE: Whole body anterior and posterior images were obtained approximately 3 hours after intravenous injection of radiopharmaceutical. RADIOPHARMACEUTICALS:  Twenty-two mCi Technetium-81m MDP IV COMPARISON:  None in PACs FINDINGS: There is adequate uptake of the radiopharmaceutical by the skeleton. There is adequate soft tissue clearance and renal activity. No abnormal uptake is observed to suggest metastatic disease. Mildly increased uptake in the right hand and both ankles and at approximately L5 is most compatible with degenerative change. On the CT scan of today's date there are bilateral pars defects at L5. IMPRESSION: No bone scan evidence of metastatic disease to the skeleton. Electronically Signed   By: David  Martinique M.D.   On: 05/21/2017 15:15    Assessment and Plan:   66 year old gentleman with the following issues:  1. Prostate cancer diagnosed in August 2016. His PSA is 14.7 and a Gleason score 4+5 = 9 with high-volume disease and extraprostatic extension. Staging workup included a bone scan which showed no evidence of disease. His CT scan didn't show pelvic and retroperitoneal adenopathy that are likely due to metastatic disease rather than reactive in nature.  His case was discussed of any prostate cancer multidisciplinary clinic. His discussion included review with his imaging studies with radiology. It was felt that his lymphadenopathy is less likely related to inflammation and highly suggestive of malignancy.  These findings were reviewed with the patient and his family today in detail. He appears to have metastatic disease and systemic therapy is warranted. The rationale for using long-term androgen deprivation therapy was discussed today. Complications  associated with that were reviewed which include weight gain, fatigue, sexual dysfunction, hot flashes as well as metabolic derangements. He is agreeable to proceed with treatment and we'll communicate that to with a Dr. Jeffie Pollock to start treatment in the near future.  The rationale for adding Zytiga and prednisone was discussed today. Given his high-risk disease and lymphadenopathy, he would qualify for adding Zytiga to androgen deprivation. Complications associated with this medication was discussed today. This would include fatigue, edema, hypokalemia, hypertension and possible adrenal insufficiency. The benefit would be improving his overall survival as well as disease control long-term.  The rationale for treating him with local therapy with radiation was discussed today. Application associated with radiation was reviewed by Dr. Tammi Klippel today. Given his advanced disease the role for radiation therapy can be deferred for the time being less he develops local symptoms. If his local symptoms continue to be an issue, I'll refer him back to Dr. Tammi Klippel to receive radiation therapy.  2. Colon cancer screening: I recommended proceeding with colonoscopy given his abnormal CT scan findings. These findings could suggest diverticulitis although he has no symptoms to suggest that. He has never  had a colonoscopy and it is reasonable to have that done given these findings as well as need for staging.  3. Follow-up: I will arrange follow-up in the next 6-8 weeks to follow his progress and discussed starting Zytiga at that time.

## 2017-05-23 NOTE — Addendum Note (Signed)
Encounter addended by: Tyler Pita, MD on: 05/23/2017  4:23 PM<BR>    Actions taken: Sign clinical note

## 2017-05-23 NOTE — Progress Notes (Addendum)
Radiation Oncology         [phone] ________________________________  Multidisciplinary Prostate Cancer Clinic  Initial Radiation Oncology Consultation  Name: Bobby [name]. MRN: [id number]  Date: 05/23/2017  DOB: November 16, 1950  Bobby [name], L.Bobby Sa, MD  Raynelle Bring, MD   REFERRING PHYSICIAN: Raynelle Bring, MD  DIAGNOSIS: 66 y.o. 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 Malignant neoplasm of prostate ([place]) Bobby [name]. is a 66 y.o. gentleman.  He was noted to have an elevated PSA of 11.5 by his primary care physician, Dr. Alroy Dust.  Accordingly, he was referred for evaluation in urology by Dr. Jeffie Pollock on 04/09/17,  digital rectal examination was performed at that time revealing firm induration bilateral prostate lobes.  A repeat PSA on 04/09/17 was further elevated at 14.70. The patient proceeded to transrectal ultrasound with 10 biopsies of the prostate on 04/24/17.  The prostate volume measured 49.42 cc.  Out of 10 core biopsies,10 were positive.  The maximum Gleason score was 4+5, and this was seen in the left base, left base lateral, left mid and left apex lateral as well as in the right base, right mid, right mid lateral and right apex lateral.  Additionally, there was Gleason 4+4 disease in right base lateral and Gleason 4+3 disease in the left mid lateral.    Bone scan and CT A/P were performed on 05/21/17 for further disease staging.  The bone scan was negative for evidence of osseous metastatic disease. The CT scan showed significant pelvic lymphadenopathy in the bilateral common iliac nodes, a retroperitoneal node and a  periaortic node.  There was also significant colonic diverticulosis noted in the descending and sigmoid colon with marked sigmoid wall thickening and inflammation suggesting possible diverticulitis.  Additionally, there was an 4mm area of concern within the colonic wall concerning for abscess versus colon cancer.   The patient reviewed the biopsy results with his urologist and he has kindly been referred today to the multidisciplinary prostate cancer clinic for presentation of pathology and radiology studies in our conference for discussion of potential radiation treatment options and clinical evaluation.  PREVIOUS RADIATION THERAPY: No  PAST MEDICAL HISTORY:  has a past medical history of History of kidney stones and Prostate cancer ([place]).    PAST SURGICAL HISTORY: Past Surgical History:  Procedure Laterality Date  . EXTRACORPOREAL SHOCK WAVE LITHOTRIPSY Right 04/17/2017   Procedure: RIGHT EXTRACORPOREAL SHOCK WAVE LITHOTRIPSY (ESWL);  Surgeon: Kathie Rhodes, MD;  Location: [facility];  Service: Urology;  Laterality: Right;  . HERNIA REPAIR     age 27  . PROSTATE BIOPSY      FAMILY HISTORY: family history includes Cancer in his sister.  SOCIAL HISTORY:  reports that he has been smoking Cigarettes.  He has a 2.50 pack-year smoking history. He has never used smokeless tobacco. He reports that he does not drink alcohol or use drugs.  ALLERGIES: Patient has no known allergies.  MEDICATIONS:  Current Outpatient Prescriptions  Medication Sig Dispense Refill  . tamsulosin (FLOMAX) 0.4 MG CAPS capsule Take 1 capsule (0.4 mg total) by mouth daily after supper. 30 capsule 0  .  lidocaine (LIDODERM) 5 % Place 1 patch onto the skin daily. Remove & Discard patch within 12 hours or as directed by MD    . Oxycodone HCl 10 MG TABS Take 1 tablet (10 mg total) by mouth every 4 (four) hours as needed. (Patient not taking: Reported on 05/23/2017) 20 tablet 0   No current facility-administered medications for  this encounter.     REVIEW OF SYSTEMS:  On review of systems, the patient reports that he is doing well overall. He denies any chest pain, shortness of breath, cough, fevers, chills, night sweats, unintended weight changes. He denies any bowel disturbances, and denies abdominal pain, nausea or vomiting. He does report a longstanding history of diverticulosis with multiple previous bouts of diverticulitis but is currently without symptoms.  He denies any new musculoskeletal or joint aches or pains. His IPSS was 20, indicating severe urinary symptoms including weak stream, intermittency, urgency, frequency, feelings of incomplete emptying and nocturia x 3/night. He has severe erectile function with a SHIM score of 5.  He reports the ability to complete sexual activity with less than half the attempts. A complete review of systems is obtained and is otherwise negative.   PHYSICAL EXAM:  Wt Readings from Last 3 Encounters:  05/23/17 245 lb 9.6 oz (111.4 kg)  04/17/17 246 lb 9.6 oz (111.9 kg)  09/08/12 253 lb (114.8 kg)   Temp Readings from Last 3 Encounters:  05/23/17 98 F (36.7 C) (Oral)  04/17/17 97.7 F (36.5 C) (Oral)  09/08/12 98.3 F (36.8 C) (Oral)   BP Readings from Last 3 Encounters:  05/23/17 (!) 150/75  04/17/17 (!) 173/93  09/08/12 140/60   Pulse Readings from Last 3 Encounters:  05/23/17 85  04/17/17 68  09/08/12 87   Pain Assessment Pain Score: 0-No pain/10  In general this is a well appearing caucasian male in no acute distress. He is alert and oriented x4 and appropriate throughout the examination. HEENT reveals that the patient is normocephalic, atraumatic. EOMs are intact. PERRLA. Skin is intact without any evidence of gross lesions. Cardiovascular exam reveals a regular rate and rhythm, no clicks rubs or murmurs are auscultated. Chest is clear to auscultation bilaterally. Lymphatic assessment is performed and does not reveal any adenopathy in the cervical,  supraclavicular, axillary, or inguinal chains. Abdomen has active bowel sounds in all quadrants and is intact. The abdomen is soft, non tender, non distended. Lower extremities are negative for pretibial pitting edema, deep calf tenderness, cyanosis or clubbing.  KPS = 100  100 - Normal; no complaints; no evidence of disease. 90   - Able to carry on normal activity; minor signs or symptoms of disease. 80   - Normal activity with effort; some signs or symptoms of disease. 19   - Cares for self; unable to carry on normal activity or to do active work. 60   - Requires occasional assistance, but is able to care for most of his personal needs. 50   - Requires considerable assistance and frequent medical care. 90   - Disabled; requires special care and assistance. 34   - Severely disabled; hospital admission is indicated although death not imminent. 91   - Very sick; hospital admission necessary; active supportive treatment necessary. 10   - Moribund; fatal processes progressing rapidly. 0     - Dead  Karnofsky DA, Abelmann WH, Craver LS and Burchenal [facility] [phone]) The use of the nitrogen mustards in the palliative treatment of carcinoma: with particular reference to bronchogenic  carcinoma Cancer 1 634-56   LABORATORY DATA:  Lab Results  Component Value Date   WBC 8.6 11/10/2009   HGB 15.7 11/10/2009   HCT 47.0 11/10/2009   MCV 85.4 11/10/2009   PLT 182.0 11/10/2009   Lab Results  Component Value Date   K 3.2 (L) 11/10/2009   Lab Results  Component Value Date   ALT 29 11/10/2009   AST 24 11/10/2009     RADIOGRAPHY: Nm Bone Scan Whole Body  Result Date: 05/21/2017 CLINICAL DATA:  Reason diagnosis of prostate malignancy EXAM: NUCLEAR MEDICINE WHOLE BODY BONE SCAN TECHNIQUE: Whole body anterior and posterior images were obtained approximately 3 hours after intravenous injection of radiopharmaceutical. RADIOPHARMACEUTICALS:  Twenty-two mCi Technetium-61m MDP IV COMPARISON:  None in PACs  FINDINGS: There is adequate uptake of the radiopharmaceutical by the skeleton. There is adequate soft tissue clearance and renal activity. No abnormal uptake is observed to suggest metastatic disease. Mildly increased uptake in the right hand and both ankles and at approximately L5 is most compatible with degenerative change. On the CT scan of today's date there are bilateral pars defects at L5. IMPRESSION: No bone scan evidence of metastatic disease to the skeleton. Electronically Signed   By: David  Martinique M.D.   On: 05/21/2017 15:15      IMPRESSION/PLAN: 66 y.o. gentleman with a high risk, stage IVB adenocarcinoma of the prostate with a PSA of 14.7, a Gleason score of 4+5 and evidence of lymph node involvement in the pelvic and periaortic nodes.   We discussed the patient's workup and outlines the nature of prostate cancer in this setting. The patient's T stage, Gleason's score, and PSA put him into the high risk group and recent imaging indicates that his disease is metastatic. His case was reviewed and discussed at the multidisciplinary prostate conference this morning and consensus was that he would benefit from LT-ADT and Zytiga with consideration for palliative radiotherapy in the future based on his response to treatment.  It is also recommended that he have a screening colonoscopy for further evaluation of the abnormal focus within the colonic wall seen on recent CT imaging.  At the end of the conversation, the patient seems to have a good understanding of his disease and the recommendations for treatment. He is in agreement with initiating treatment with ADT and Zytiga as well as proceeding with scheduling a screening colonoscopy. He would like to be considered for aggressive treatment of his prostate cancer with the addition of radiotherapy if deemed appropriate based on his response to systemic therapy. At this time, his disease management will be directed by Dr. Jeffie Pollock and  Dr. Alen Blew and we will  remain in close communication and collaboration.  We will be on standby and happy to participate in his care if deemed clinically appropriate.   We spent 60 minutes face to face with the patient and more than 50% of that time was spent in counseling and/or coordination of care.     Nicholos Johns, PA-C    Tyler Pita, MD  [facility] Direct Dial: [phone]  Fax: [phone] [email or web address]  Skype  LinkedIn    Page Me      This document serves as a record of services personally performed by Tyler Pita, MD and Freeman Caldron, PA-C. It was created on their behalf by Margit Banda, a trained medical scribe. The creation of this record is based on the scribe's personal observations and the provider's statements to them. This document has  been checked and approved by the attending provider.

## 2017-05-23 NOTE — Progress Notes (Signed)
                               Care Plan Summary  Name: Bobby [name], Bobby [name]. DOB: 04/13/1951   Your Medical Team:   Urologist -  Dr. Raynelle Bring, [facility]  Radiation Oncologist - Dr. Tyler Pita, [facility]   Medical Oncologist - Dr. Zola Button, [place]  Recommendations: 1) Androgen Deprivation (hormone injections)  2) Zytiga  3) Appointment with gastroenterologist to evaluate colon  * These recommendations are based on information available as of today's consult.      Recommendations may change depending on the results of further tests or exams.  Next Steps: 1) Dr. Ralene Muskrat office will schedule hormone injection 2) Dr. Alen Blew will schedule follow up appointment 3) Make an appointment with gastroenterologist. If you have trouble, please call Robin and Dr. Alen Blew will make referral.  When appointments need to be scheduled, you will be contacted by [facility] and/or [facility].  Patient given business cards for all team members. A copy of " Falls Prevention Safety" sheet provided. Questions?  Please do not hesitate to call Cira Rue, RN, BSN, OCN at [phone] any questions or concerns.  Shirlean Mylar is your Oncology Nurse Navigator and is available to assist you while you're receiving your medical care at [facility].

## 2017-05-23 NOTE — Telephone Encounter (Signed)
No orders per 9/14 los

## 2017-05-26 ENCOUNTER — Encounter: Payer: Self-pay | Admitting: General Practice

## 2017-05-26 ENCOUNTER — Telehealth: Payer: Self-pay | Admitting: Oncology

## 2017-05-26 NOTE — Progress Notes (Signed)
[place] Psychosocial Distress Screening Spiritual Care  Met with Bobby [name], wife Bobby [name], and daughter Hildred Alamin in [facility] to introduce [facility] team/resources, reviewing distress screen per protocol.  The patient scored a 6 on the Psychosocial Distress Thermometer which indicates moderate distress. Also assessed for distress and other psychosocial needs.   ONCBCN DISTRESS SCREENING 05/26/2017  Screening Type Initial Screening  Distress experienced in past week (1-10) 6  Practical problem type Work/school  Family Problem type Children  Referral to support programs Yes   Per Mr Vallance, his top stressors this past week have been work and concern for his son (who lives near [facility], in the path of [place]).  His wife Bobby [name] showed emotional distress via tears.  [place] team and programming resources in detail, normalizing feelings, encouraging participation, and describing flexibility of Spiritual Care (eg, including phone appts as desired).  Also normalized that couples often support each other and also need outside support/relationships, especially during stressful times.   Follow up needed: Yes.  Given family's unanticipated stressors of metastatic prostate cancer and colon findings, plan to send Mr and Ms Popov a handwritten f/u card of encouragement.   [place], [place], [facility] Pager [phone] Voicemail [phone]

## 2017-05-26 NOTE — Telephone Encounter (Signed)
Spoke with patient regarding his appts on 10/26.

## 2017-06-02 ENCOUNTER — Telehealth: Payer: Self-pay | Admitting: Medical Oncology

## 2017-06-02 NOTE — Telephone Encounter (Signed)
I spoke with Mr. Avetisyan as follow up post Prostate MDC. He is scheduled for ADT 06/04/17 at [facility]. I asked if he was able to schedule an appointment with a gastroenterologist to further evaluate the colon mass found on CT. He states he has an appointment but unsure of the physician's name.  I will continue to follow and asked him to call if questions or concerns. He voiced understanding. He has follow up with Dr. Alen Blew 07/04/17.

## 2017-06-04 DIAGNOSIS — C778 Secondary and unspecified malignant neoplasm of lymph nodes of multiple regions: Secondary | ICD-10-CM | POA: Diagnosis not present

## 2017-06-04 DIAGNOSIS — C61 Malignant neoplasm of prostate: Secondary | ICD-10-CM | POA: Diagnosis not present

## 2017-06-12 ENCOUNTER — Telehealth: Payer: Self-pay | Admitting: Medical Oncology

## 2017-06-12 NOTE — Telephone Encounter (Signed)
Bobby [name] called stating that he saw the gastroenterologist and has a colonoscopy scheduled for 07/04/17 late afternoon. He asked if this is too far out. I explained that the date is fine but I see he is scheduled to see Dr. Alen Blew early that morning. I asked if he would like to reschedule this appointment with Dr. Alen Blew. He states he thinks he will be fine because he has to drink his prep the night before. He did verify he received his firmagon  9/216/18. I asked him to call me if he decides he would like to change his appointment and he voiced understanding.

## 2017-07-04 ENCOUNTER — Ambulatory Visit (HOSPITAL_BASED_OUTPATIENT_CLINIC_OR_DEPARTMENT_OTHER): Payer: Managed Care, Other (non HMO) | Admitting: Oncology

## 2017-07-04 ENCOUNTER — Other Ambulatory Visit (HOSPITAL_BASED_OUTPATIENT_CLINIC_OR_DEPARTMENT_OTHER): Payer: Managed Care, Other (non HMO)

## 2017-07-04 ENCOUNTER — Telehealth: Payer: Self-pay

## 2017-07-04 VITALS — BP 157/90 | HR 82 | Temp 98.7°F | Resp 19 | Ht 67.0 in | Wt 243.8 lb

## 2017-07-04 DIAGNOSIS — C61 Malignant neoplasm of prostate: Secondary | ICD-10-CM

## 2017-07-04 DIAGNOSIS — E291 Testicular hypofunction: Secondary | ICD-10-CM | POA: Diagnosis not present

## 2017-07-04 LAB — CBC WITH DIFFERENTIAL/PLATELET
BASO%: 0.6 % (ref 0.0–2.0)
BASOS ABS: 0 10*3/uL (ref 0.0–0.1)
EOS%: 2.9 % (ref 0.0–7.0)
Eosinophils Absolute: 0.2 10*3/uL (ref 0.0–0.5)
HEMATOCRIT: 48.3 % (ref 38.4–49.9)
HEMOGLOBIN: 16.3 g/dL (ref 13.0–17.1)
LYMPH#: 1.1 10*3/uL (ref 0.9–3.3)
LYMPH%: 17.7 % (ref 14.0–49.0)
MCH: 28.3 pg (ref 27.2–33.4)
MCHC: 33.7 g/dL (ref 32.0–36.0)
MCV: 84 fL (ref 79.3–98.0)
MONO#: 0.5 10*3/uL (ref 0.1–0.9)
MONO%: 7.2 % (ref 0.0–14.0)
NEUT#: 4.5 10*3/uL (ref 1.5–6.5)
NEUT%: 71.6 % (ref 39.0–75.0)
Platelets: 178 10*3/uL (ref 140–400)
RBC: 5.75 10*6/uL (ref 4.20–5.82)
RDW: 14.4 % (ref 11.0–14.6)
WBC: 6.2 10*3/uL (ref 4.0–10.3)
nRBC: 0 % (ref 0–0)

## 2017-07-04 LAB — COMPREHENSIVE METABOLIC PANEL
ALBUMIN: 3.8 g/dL (ref 3.5–5.0)
ALK PHOS: 65 U/L (ref 40–150)
ALT: 16 U/L (ref 0–55)
AST: 15 U/L (ref 5–34)
Anion Gap: 8 mEq/L (ref 3–11)
BUN: 20.9 mg/dL (ref 7.0–26.0)
CALCIUM: 9.3 mg/dL (ref 8.4–10.4)
CO2: 27 mEq/L (ref 22–29)
Chloride: 107 mEq/L (ref 98–109)
Creatinine: 1.2 mg/dL (ref 0.7–1.3)
Glucose: 110 mg/dl (ref 70–140)
POTASSIUM: 3.8 meq/L (ref 3.5–5.1)
Sodium: 142 mEq/L (ref 136–145)
Total Bilirubin: 0.42 mg/dL (ref 0.20–1.20)
Total Protein: 6.6 g/dL (ref 6.4–8.3)

## 2017-07-04 MED ORDER — ABIRATERONE ACETATE 250 MG PO TABS: 1000.0000 mg | ORAL_TABLET | Freq: Every day | ORAL | 0 refills | Status: DC

## 2017-07-04 MED ORDER — PREDNISONE 5 MG PO TABS: 5.0000 mg | ORAL_TABLET | Freq: Every day | ORAL | 3 refills | Status: DC

## 2017-07-04 NOTE — Progress Notes (Signed)
Hematology and Oncology Follow Up Visit  Bobby [name] [id number] 05-08-1951 66 y.o. 07/04/2017 10:07 AM Alroy Dust, L.Dean, MDMitchell, L.Marlou Sa, MD   Principle Diagnosis: 66 year old gentleman with prostate cancer diagnosed in August 2018. His PSA is 14.7 and a Gleason score 4+5 = 9 with high-volume disease and extraprostatic extension. Staging workup included a bone scan which showed no evidence of disease. His CT scan did show pelvic and retroperitoneal adenopathy that are likely due to metastatic disease rather than reactive in nature.   Prior Therapy: He is status post prostate biopsy in August 2018.  Current therapy:  Androgen deprivation therapy under the care of Dr. Jeffie Pollock started in September 2018.  Under consideration to start Zytiga in the near future.  Interim History: Bobby [name] presents today for a follow-up visit.  Since the last visit, he started androgen deprivation therapy and received the first injection under the care of Dr. Jeffie Pollock without complications.  He did report an injection related pain, slight weight gain but overall felt reasonably well.  He reports less fatigue, tiredness and no hot flashes.  His appetite is excellent and performance status is improving.  He denies any pathological fractures or bone pain.  He denies any urination difficulties.  He does not report any headaches, blurry vision, syncope or seizures. He does not report any fevers, chills, sweats or weight loss. He does not report any chest pain, palpitation, orthopnea or leg edema. He does not report any cough, wheezing or hemoptysis. He does not report any nausea, vomiting or abdominal pain. He does not report any hematochezia or melena. He does not report any hematuria or dysuria. He does report some nocturia which improved. He does report frequency at times. Remaining review of systems unremarkable.   Medications: I have reviewed the patient's current medications.  Current Outpatient Prescriptions   Medication Sig Dispense Refill  . abiraterone Acetate (ZYTIGA) 250 MG tablet Take 4 tablets (1,000 mg total) by mouth daily. Take on an empty stomach 1 hour before or 2 hours after a meal 120 tablet 0  . lidocaine (LIDODERM) 5 % Place 1 patch onto the skin daily. Remove & Discard patch within 12 hours or as directed by MD    . Oxycodone HCl 10 MG TABS Take 1 tablet (10 mg total) by mouth every 4 (four) hours as needed. (Patient not taking: Reported on 05/23/2017) 20 tablet 0  . predniSONE (DELTASONE) 5 MG tablet Take 1 tablet (5 mg total) by mouth daily with breakfast. 30 tablet 3  . tamsulosin (FLOMAX) 0.4 MG CAPS capsule Take 1 capsule (0.4 mg total) by mouth daily after supper. 30 capsule 0   No current facility-administered medications for this visit.      Allergies: No Known Allergies  Past Medical History, Surgical history, Social history, and Family History were reviewed and updated.   Remaining ROS negative. Physical Exam: Blood pressure (!) 157/90, pulse 82, temperature 98.7 F (37.1 C), temperature source Oral, resp. rate 19, height 5\' 7"  (1.702 m), weight 243 lb 12.8 oz (110.6 kg), SpO2 98 %. ECOG: 1 General appearance: alert and cooperative appeared without distress. Head: Normocephalic, without obvious abnormality no oral thrush or ulcers. Neck: no adenopathy Lymph nodes: Cervical, supraclavicular, and axillary nodes normal. Heart:regular rate and rhythm, S1, S2 normal, no murmur, click, rub or gallop Lung:chest clear, no wheezing, rales, normal symmetric air entry.  Abdomin: soft, non-tender, without masses or organomegaly EXT:no erythema, induration, or nodules   Lab Results: Lab Results  Component Value Date  WBC 6.2 07/04/2017   HGB 16.3 07/04/2017   HCT 48.3 07/04/2017   MCV 84.0 07/04/2017   PLT 178 07/04/2017     Chemistry      Component Value Date/Time   NA 142 07/04/2017 0900   K 3.8 07/04/2017 0900   CO2 27 07/04/2017 0900   BUN 20.9 07/04/2017  0900   CREATININE 1.2 07/04/2017 0900      Component Value Date/Time   CALCIUM 9.3 07/04/2017 0900   ALKPHOS 65 07/04/2017 0900   AST 15 07/04/2017 0900   ALT 16 07/04/2017 0900   BILITOT 0.42 07/04/2017 0900        Impression and Plan:  66 year old gentleman with the following issues:  1. Prostate cancer diagnosed in August 2018. His PSA is 14.7 and a Gleason score 4+5 = 9 with high-volume disease and extraprostatic extension. Staging workup included a bone scan which showed no evidence of disease. His CT scan did show pelvic and retroperitoneal adenopathy that are likely due to metastatic disease rather than reactive in nature.  Patient was discussed in the prostate cancer multidisciplinary clinic in September 2018 and felt that systemic therapy is warranted at this time without any treatment for his local disease for the time being.  He started androgen deprivation therapy under the care of Dr. Jeffie Pollock and he is here to discuss further systemic therapy.  The rationale for using Zytiga or systemic chemotherapy was discussed today.  I have reviewed the clinical trials that led to the approval of both those agents in this particular setting.  Given his high volume disease and high risk disease, he meets the criteria to adding Zytiga at this time.  Risks and benefits associated with this medication was discussed today.  These complications include nausea, fatigue, edema, hypokalemia, hypertension and elevation in his liver function test.  Low-dose prednisone at 5 mg daily is also recommended and complications associated with that includes hypertension, hyperglycemia and insomnia.  Benefit of adding Zytiga includes improvement in overall survival that has been substantial approaching 13 months.  After discussion today, he is agreeable to proceed in the immediate future.  2.  Androgen deprivation therapy: I recommended continuing this indefinitely.  He is currently receiving it under the care  of Dr. Jeffie Pollock.   3.  Colonoscopy screening: Recommended having a screening colonoscopy in the near future.  4.  Follow-up: Will be in the next 3-4 weeks to follow his progress.     Zola Button, MD 10/26/201810:07 AM

## 2017-07-04 NOTE — Telephone Encounter (Signed)
Printed avs and calender for his upcoming appointment. Per 10/26

## 2017-07-05 LAB — PSA: Prostate Specific Ag, Serum: 3.2 ng/mL (ref 0.0–4.0)

## 2017-07-05 LAB — TESTOSTERONE: TESTOSTERONE: 6 ng/dL — AB (ref 264–916)

## 2017-07-07 ENCOUNTER — Telehealth: Payer: Self-pay | Admitting: Pharmacist

## 2017-07-07 ENCOUNTER — Telehealth: Payer: Self-pay | Admitting: Pharmacy Technician

## 2017-07-07 ENCOUNTER — Telehealth: Payer: Self-pay | Admitting: *Deleted

## 2017-07-07 DIAGNOSIS — C61 Malignant neoplasm of prostate: Secondary | ICD-10-CM | POA: Diagnosis not present

## 2017-07-07 MED ORDER — ABIRATERONE ACETATE 250 MG PO TABS: 1000.0000 mg | ORAL_TABLET | Freq: Every day | ORAL | 0 refills | Status: DC

## 2017-07-07 NOTE — Telephone Encounter (Signed)
As noted below by Dr. Shadad, I informed patient of his PSA level. Patient verbalized understanding. 

## 2017-07-07 NOTE — Telephone Encounter (Signed)
Oral Oncology Pharmacist Encounter  Received new prescription for Zytiga for the treatment of advanced, castration-sensitive prostate cancer in conjunction with prednisone and androgen-deprivation therapy (Firmagon -> Lupron), planned duration until disease progression or unacceptable toxicity.  Labs from 07/04/17 assessed, OK for treatment. BPs in Epic reviewed, borderline hypertensive, patient not on anti-hypertensive therapy per medication list, this will be monitored.  Current medication list in Epic reviewed, no significant DDIs with Zytiga identified.  Prescription has been e-scribed to [employer] for benefits analysis and approval per insurance requirement.   I spoke with patient for overview of: Zytiga.   Pt is doing well. Counseled patient on administration, dosing, side effects, monitoring, drug-food interactions, safe handling, storage, and disposal.  Patient will take Zytiga 250mg  tablets, 4 tablets (1000mg ) by mouth once daily on an empty stomach, 1 hour before or 2 hours after a meal. Patient states he will take his Zyitga 1st thing in the morning and will wait at least 1 hour before eating. Patient will take prednisone 5mg  tablet, 1 tablet by mouth one daily with breakfast. Zytiga start date: pending medication acquisition  Side effects include but not limited to: peripheral edema, GI upset, hypertension, hot flashes, fatigue, and arthralgias.   Patient made aware BP will be monitored  Reviewed with patient importance of keeping a medication schedule and plan for any missed doses.  Bobby [name] voiced understanding and appreciation.   All questions answered. Medication reconciliation performed and medication/allergy list updated.  Patient knows to call the office with questions or concerns. I provided phone number to [place] and Oral oncology Clinic to patient. Oral Oncology Clinic will continue to follow.  Thank you,  Johny Drilling, PharmD, BCPS,  BCOP 07/07/2017 3:47 PM Oral Oncology Clinic [phone]

## 2017-07-07 NOTE — Telephone Encounter (Signed)
Oral Oncology Patient Advocate Encounter  Received notification from [employer] that prior authorization for Bobby [name] is required.  PA submitted on CoverMyMeds Key [id number] Status is pending  Oral Oncology Clinic will continue to follow.  Bobby [name]. Melynda Keller, [place] Patient [place] [phone] 07/07/2017 9:48 AM

## 2017-07-07 NOTE — Telephone Encounter (Signed)
Oral Oncology Bobby Advocate Encounter  Received notification from Christella Scheuermann (the Bobby's insurance carrier) that prior authorization for Bobby [name] must be submitted directly to [facility] via phone [phone])  The request for prior authorization has been submitted.  Case #: [id number] Status is pending  Oral Oncology Clinic will continue to follow.  Bobby [name]. Bobby [name], Bobby [name] Bobby [name] Bobby [name] [phone] 07/07/2017 1:15 PM

## 2017-07-07 NOTE — Telephone Encounter (Signed)
-----   Message from Wyatt Portela, MD sent at 07/07/2017  8:39 AM EDT ----- Please let him know his PSA is down to 3.2

## 2017-07-09 NOTE — Telephone Encounter (Signed)
Oral Oncology Patient Advocate Encounter  Prior Authorization for Bobby [name] has been approved.    PA# [id number] Effective dates: 07/09/2017 through 07/09/2018  Oral Oncology Clinic will continue to follow.   Bobby [name]. Melynda Keller, [place] Patient [place] [phone] 07/09/2017 9:14 AM

## 2017-07-16 NOTE — Telephone Encounter (Signed)
Oral Oncology Patient Advocate Encounter  Received confirmation from [facility] that the patient received his initial shipment of Zytiga on 07-15-2017.    Fabio Asa. Melynda Keller, [place] Patient [place] [phone] 07/16/2017 8:11 AM

## 2017-07-17 ENCOUNTER — Telehealth: Payer: Self-pay | Admitting: Medical Oncology

## 2017-07-17 NOTE — Telephone Encounter (Signed)
Bobby [name] called stating he went to [facility] to pick up his prednisone prescription and was told it was not called in. Per his chart Dr. Alen Blew e-scribed on 07/04/17. I will follow up and give him a call back.

## 2017-07-17 NOTE — Telephone Encounter (Signed)
Spoke with Bobby [name] to let him know that I called [facility] regarding prednisone prescription. I was informed it did not come across. I gave them verbal order as prescribed by Dr. Alen Blew. He voiced understanding.

## 2017-07-28 ENCOUNTER — Encounter: Payer: Self-pay | Admitting: *Deleted

## 2017-07-29 ENCOUNTER — Encounter: Payer: Self-pay | Admitting: *Deleted

## 2017-07-30 ENCOUNTER — Encounter: Payer: Self-pay | Admitting: *Deleted

## 2017-07-30 ENCOUNTER — Other Ambulatory Visit: Payer: Self-pay | Admitting: *Deleted

## 2017-07-30 DIAGNOSIS — C61 Malignant neoplasm of prostate: Secondary | ICD-10-CM

## 2017-07-30 MED ORDER — ABIRATERONE ACETATE 250 MG PO TABS: 1000.0000 mg | ORAL_TABLET | Freq: Every day | ORAL | 0 refills | Status: DC

## 2017-08-06 ENCOUNTER — Ambulatory Visit (HOSPITAL_BASED_OUTPATIENT_CLINIC_OR_DEPARTMENT_OTHER): Payer: Managed Care, Other (non HMO) | Admitting: Oncology

## 2017-08-06 ENCOUNTER — Telehealth: Payer: Self-pay | Admitting: Oncology

## 2017-08-06 ENCOUNTER — Other Ambulatory Visit (HOSPITAL_BASED_OUTPATIENT_CLINIC_OR_DEPARTMENT_OTHER): Payer: Managed Care, Other (non HMO)

## 2017-08-06 VITALS — BP 163/82 | HR 76 | Temp 98.5°F | Resp 18 | Ht 67.0 in | Wt 246.4 lb

## 2017-08-06 DIAGNOSIS — C61 Malignant neoplasm of prostate: Secondary | ICD-10-CM | POA: Diagnosis not present

## 2017-08-06 DIAGNOSIS — I10 Essential (primary) hypertension: Secondary | ICD-10-CM | POA: Diagnosis not present

## 2017-08-06 DIAGNOSIS — E291 Testicular hypofunction: Secondary | ICD-10-CM | POA: Diagnosis not present

## 2017-08-06 LAB — COMPREHENSIVE METABOLIC PANEL
ALT: 28 U/L (ref 0–55)
ANION GAP: 8 meq/L (ref 3–11)
AST: 21 U/L (ref 5–34)
Albumin: 3.8 g/dL (ref 3.5–5.0)
Alkaline Phosphatase: 71 U/L (ref 40–150)
BUN: 20.2 mg/dL (ref 7.0–26.0)
CHLORIDE: 109 meq/L (ref 98–109)
CO2: 26 meq/L (ref 22–29)
Calcium: 9.6 mg/dL (ref 8.4–10.4)
Creatinine: 1.2 mg/dL (ref 0.7–1.3)
EGFR: >60 mL/min/{1.73_m2} (ref >60)
Glucose: 112 mg/dl (ref 70–140)
Potassium: 4 mEq/L (ref 3.5–5.1)
SODIUM: 143 meq/L (ref 136–145)
Total Bilirubin: 0.42 mg/dL (ref 0.20–1.20)
Total Protein: 6.8 g/dL (ref 6.4–8.3)

## 2017-08-06 LAB — CBC WITH DIFFERENTIAL/PLATELET
BASO%: 1 % (ref 0.0–2.0)
BASOS ABS: 0.1 10*3/uL (ref 0.0–0.1)
EOS ABS: 0.3 10*3/uL (ref 0.0–0.5)
EOS%: 3.4 % (ref 0.0–7.0)
HCT: 45.3 % (ref 38.4–49.9)
HEMOGLOBIN: 15.1 g/dL (ref 13.0–17.1)
LYMPH%: 17.8 % (ref 14.0–49.0)
MCH: 28.4 pg (ref 27.2–33.4)
MCHC: 33.4 g/dL (ref 32.0–36.0)
MCV: 84.9 fL (ref 79.3–98.0)
MONO#: 0.6 10*3/uL (ref 0.1–0.9)
MONO%: 7.5 % (ref 0.0–14.0)
NEUT#: 5.3 10*3/uL (ref 1.5–6.5)
NEUT%: 70.3 % (ref 39.0–75.0)
Platelets: 181 10*3/uL (ref 140–400)
RBC: 5.34 10*6/uL (ref 4.20–5.82)
RDW: 15.3 % — AB (ref 11.0–14.6)
WBC: 7.6 10*3/uL (ref 4.0–10.3)
lymph#: 1.3 10*3/uL (ref 0.9–3.3)

## 2017-08-06 NOTE — Progress Notes (Signed)
Hematology and Oncology Follow Up Visit  Bobby [name] [id number] 01-07-1951 66 y.o. 08/06/2017 1:11 PM Alroy Dust, L.Dean, MDMitchell, L.Marlou Sa, MD   Principle Diagnosis: 66 year old gentleman with prostate cancer diagnosed in August 2018. His PSA is 14.7 and a Gleason score 4+5 = 9 with high-volume disease and extraprostatic extension. Staging workup included a bone scan which showed no evidence of disease. His CT scan did show pelvic and retroperitoneal adenopathy that are likely due to metastatic disease rather than reactive in nature.   Prior Therapy: He is status post prostate biopsy in August 2018.  Current therapy:  Androgen deprivation therapy under the care of Dr. Jeffie Pollock started in September 2018.  Zytiga 1000 mg daily with prednisone 5 mg daily started in October 2018.  Interim History: Bobby [name] presents today for a follow-up visit.  Since the last visit, he started Zytiga and prednisone without major complications.  He reports mild fatigue and arthralgias that has been manageable at this time.  Despite the symptoms, he continues to perform activities of daily living and works full-time.  He denies any nausea, vomiting or change in his bowel habits.  His appetite is excellent and performance status is normal at this time.  He denies any pathological fractures or bone pain.  He denies any urination difficulties.  He does not report any headaches, blurry vision, syncope or seizures. He does not report any fevers, chills, sweats or weight loss. He does not report any chest pain, palpitation, orthopnea or leg edema. He does not report any cough, wheezing or hemoptysis. He does not report any nausea, vomiting or abdominal pain. He does not report any hematochezia or melena. He does not report any hematuria or dysuria. He does report some nocturia which improved. He does report frequency at times. Remaining review of systems unremarkable.   Medications: I have reviewed the patient's current  medications.  Current Outpatient Medications  Medication Sig Dispense Refill  . abiraterone acetate (ZYTIGA) 250 MG tablet Take 4 tablets (1,000 mg total) by mouth daily. Take on an empty stomach 1 hour before or 2 hours after a meal 120 tablet 0  . lidocaine (LIDODERM) 5 % Place 1 patch onto the skin daily. Remove & Discard patch within 12 hours or as directed by MD    . Oxycodone HCl 10 MG TABS Take 1 tablet (10 mg total) by mouth every 4 (four) hours as needed. (Patient not taking: Reported on 05/23/2017) 20 tablet 0  . predniSONE (DELTASONE) 5 MG tablet Take 1 tablet (5 mg total) by mouth daily with breakfast. 30 tablet 3  . tamsulosin (FLOMAX) 0.4 MG CAPS capsule Take 1 capsule (0.4 mg total) by mouth daily after supper. 30 capsule 0   No current facility-administered medications for this visit.      Allergies: No Known Allergies  Past Medical History, Surgical history, Social history, and Family History were reviewed and updated.   Remaining ROS negative. Physical Exam: Blood pressure (!) 163/82, pulse 76, temperature 98.5 F (36.9 C), temperature source Oral, resp. rate 18, height 5\' 7"  (1.702 m), weight 246 lb 6.4 oz (111.8 kg), SpO2 99 %. ECOG: 1 General appearance: Well-appearing gentleman without distress. Head: Normocephalic, without obvious abnormality no oral ulcers or lesions. Neck: no adenopathy or masses. Lymph nodes: Cervical, supraclavicular, and axillary nodes normal. Heart:regular rate and rhythm, S1, S2 normal, no murmur, click, rub or gallop Lung:chest clear, no wheezing, rales, normal symmetric air entry.  Abdomin: soft, non-tender, without masses or organomegaly without shifting dullness  or ascites. EXT:no erythema, induration, or nodules   Lab Results: Lab Results  Component Value Date   WBC 7.6 08/06/2017   HGB 15.1 08/06/2017   HCT 45.3 08/06/2017   MCV 84.9 08/06/2017   PLT 181 08/06/2017     Chemistry      Component Value Date/Time   NA 142  07/04/2017 0900   K 3.8 07/04/2017 0900   CO2 27 07/04/2017 0900   BUN 20.9 07/04/2017 0900   CREATININE 1.2 07/04/2017 0900      Component Value Date/Time   CALCIUM 9.3 07/04/2017 0900   ALKPHOS 65 07/04/2017 0900   AST 15 07/04/2017 0900   ALT 16 07/04/2017 0900   BILITOT 0.42 07/04/2017 0900        Impression and Plan:  66 year old gentleman with the following issues:  1. Prostate cancer diagnosed in August 2018. His PSA is 14.7 and a Gleason score 4+5 = 9 with high-volume disease and extraprostatic extension. Staging workup included a bone scan which showed no evidence of disease. His CT scan did show pelvic and retroperitoneal adenopathy that are likely due to metastatic disease rather than reactive in nature.  Patient was discussed in the prostate cancer multidisciplinary clinic in September 2018 and felt that systemic therapy is recommended.  He is currently receiving androgen deprivation therapy with Dr. Jeffie Pollock at [facility].  He started Zytiga at 1000 mg daily and prednisone in October 2018.  He tolerated this medication well without any complications.  Risks and benefits of continuing this treatment were reviewed today and is agreeable to continue.  Dose modifications may be needed in the future if he develops worsening fatigue.  2.  Androgen deprivation therapy:He is currently receiving it under the care of Dr. Jeffie Pollock.  This to be continued indefinitely.  3.  Hypertension: His blood pressure is normal outside of the clinic.  I asked him to continue to monitor his blood pressure and record it for future visits.  He might require antihypertensive medication if that continues.  4.  Follow-up: Will be in 5 weeks to follow his progress.     Zola Button, MD 11/28/20181:11 PM

## 2017-08-06 NOTE — Telephone Encounter (Signed)
Gave avs and calendar for January 2019 °

## 2017-08-07 ENCOUNTER — Telehealth: Payer: Self-pay | Admitting: *Deleted

## 2017-08-07 LAB — PSA: PROSTATE SPECIFIC AG, SERUM: 0.6 ng/mL (ref 0.0–4.0)

## 2017-08-07 NOTE — Telephone Encounter (Signed)
As noted below by Dr. Shadad, I informed patient of his PSA level. He verbalized understanding.  

## 2017-08-07 NOTE — Telephone Encounter (Signed)
-----   Message from Wyatt Portela, MD sent at 08/07/2017  8:44 AM EST ----- Please let him know his PSA is down.

## 2017-09-04 ENCOUNTER — Other Ambulatory Visit: Payer: Self-pay | Admitting: *Deleted

## 2017-09-04 DIAGNOSIS — C61 Malignant neoplasm of prostate: Secondary | ICD-10-CM

## 2017-09-04 MED ORDER — ABIRATERONE ACETATE 250 MG PO TABS: 1000.0000 mg | ORAL_TABLET | Freq: Every day | ORAL | 0 refills | Status: DC

## 2017-09-10 ENCOUNTER — Ambulatory Visit (HOSPITAL_BASED_OUTPATIENT_CLINIC_OR_DEPARTMENT_OTHER): Payer: Managed Care, Other (non HMO) | Admitting: Oncology

## 2017-09-10 ENCOUNTER — Other Ambulatory Visit (HOSPITAL_BASED_OUTPATIENT_CLINIC_OR_DEPARTMENT_OTHER): Payer: Managed Care, Other (non HMO)

## 2017-09-10 ENCOUNTER — Telehealth: Payer: Self-pay | Admitting: Oncology

## 2017-09-10 VITALS — BP 175/92 | HR 87 | Temp 98.5°F | Resp 24 | Ht 67.0 in | Wt 246.2 lb

## 2017-09-10 DIAGNOSIS — I10 Essential (primary) hypertension: Secondary | ICD-10-CM | POA: Diagnosis not present

## 2017-09-10 DIAGNOSIS — C61 Malignant neoplasm of prostate: Secondary | ICD-10-CM

## 2017-09-10 DIAGNOSIS — E291 Testicular hypofunction: Secondary | ICD-10-CM | POA: Diagnosis not present

## 2017-09-10 LAB — CBC WITH DIFFERENTIAL/PLATELET
BASO%: 0.8 % (ref 0.0–2.0)
Basophils Absolute: 0.1 10*3/uL (ref 0.0–0.1)
EOS%: 0.9 % (ref 0.0–7.0)
Eosinophils Absolute: 0.1 10*3/uL (ref 0.0–0.5)
HEMATOCRIT: 45.5 % (ref 38.4–49.9)
HGB: 15.2 g/dL (ref 13.0–17.1)
LYMPH#: 1.1 10*3/uL (ref 0.9–3.3)
LYMPH%: 9.9 % — ABNORMAL LOW (ref 14.0–49.0)
MCH: 28.3 pg (ref 27.2–33.4)
MCHC: 33.4 g/dL (ref 32.0–36.0)
MCV: 84.7 fL (ref 79.3–98.0)
MONO#: 0.5 10*3/uL (ref 0.1–0.9)
MONO%: 4.7 % (ref 0.0–14.0)
NEUT#: 9.7 10*3/uL — ABNORMAL HIGH (ref 1.5–6.5)
NEUT%: 83.7 % — AB (ref 39.0–75.0)
Platelets: ADEQUATE 10*3/uL (ref 140–400)
RBC: 5.38 10*6/uL (ref 4.20–5.82)
RDW: 14.4 % (ref 11.0–14.6)
WBC: 11.6 10*3/uL — ABNORMAL HIGH (ref 4.0–10.3)

## 2017-09-10 LAB — COMPREHENSIVE METABOLIC PANEL
ALBUMIN: 3.9 g/dL (ref 3.5–5.0)
ALK PHOS: 84 U/L (ref 40–150)
ALT: 18 U/L (ref 0–55)
ANION GAP: 10 meq/L (ref 3–11)
AST: 14 U/L (ref 5–34)
BILIRUBIN TOTAL: 0.38 mg/dL (ref 0.20–1.20)
BUN: 19.4 mg/dL (ref 7.0–26.0)
CO2: 25 mEq/L (ref 22–29)
CREATININE: 1.2 mg/dL (ref 0.7–1.3)
Calcium: 9.7 mg/dL (ref 8.4–10.4)
Chloride: 107 mEq/L (ref 98–109)
GLUCOSE: 137 mg/dL (ref 70–140)
Potassium: 4.2 mEq/L (ref 3.5–5.1)
SODIUM: 142 meq/L (ref 136–145)
TOTAL PROTEIN: 7 g/dL (ref 6.4–8.3)

## 2017-09-10 NOTE — Progress Notes (Signed)
Hematology and Oncology Follow Up Visit  Pastor Sgro [id number] [birth year] 67 y.o. 09/10/2017 3:51 PM Alroy Dust, L.Dean, MDMitchell, L.Marlou Sa, MD   Principle Diagnosis: 67 year old gentleman with prostate cancer diagnosed in August 2018. His PSA is 14.7 and a Gleason score 4+5 = 9 with high-volume disease and extraprostatic extension. Staging workup included a bone scan which showed no evidence of disease. His CT scan did show pelvic and retroperitoneal adenopathy that are likely due to metastatic disease rather than reactive in nature.   Prior Therapy: He is status post prostate biopsy in August 2018.  Current therapy:  Androgen deprivation therapy under the care of Dr. Jeffie Pollock started in September 2018.  Zytiga 1000 mg daily with prednisone 5 mg daily started in October 2018.  Interim History: Mr. Zurawski presents today for a follow-up visit.  Since the last visit, he reports no recent complaints.  He continues to take Zytiga and prednisone without any new side effects.  He reports mild fatigue and arthralgias that has not changed since last visit.  Despite the symptoms, he continues to perform activities of daily living and works full-time.  He denies any nausea, vomiting or change in his bowel habits.  His appetite is excellent and performance status is normal at this time.  He denies any pathological fractures or bone pain.  He does not report any hematuria or dysuria.  He does not report any headaches, blurry vision, syncope or seizures. He does not report any fevers, chills, sweats or weight loss. He does not report any chest pain, palpitation, orthopnea or leg edema. He does not report any cough, wheezing or hemoptysis. He does not report any nausea, vomiting or abdominal pain. He does not report any hematochezia or melena. He does not report any hematuria or dysuria. He does report some nocturia which improved. He does report frequency at times. Remaining review of systems unremarkable.    Medications: I have reviewed the patient's current medications.  Current Outpatient Medications  Medication Sig Dispense Refill  . abiraterone acetate (ZYTIGA) 250 MG tablet Take 4 tablets (1,000 mg total) by mouth daily. Take on an empty stomach 1 hour before or 2 hours after a meal 120 tablet 0  . lidocaine (LIDODERM) 5 % Place 1 patch onto the skin daily. Remove & Discard patch within 12 hours or as directed by MD    . Oxycodone HCl 10 MG TABS Take 1 tablet (10 mg total) by mouth every 4 (four) hours as needed. (Patient not taking: Reported on 05/23/2017) 20 tablet 0  . predniSONE (DELTASONE) 5 MG tablet Take 1 tablet (5 mg total) by mouth daily with breakfast. 30 tablet 3  . tamsulosin (FLOMAX) 0.4 MG CAPS capsule Take 1 capsule (0.4 mg total) by mouth daily after supper. 30 capsule 0   No current facility-administered medications for this visit.      Allergies: No Known Allergies  Past Medical History, Surgical history, Social history, and Family History were reviewed and updated.   Remaining ROS negative. Physical Exam: Blood pressure (!) 175/92, pulse 87, temperature 98.5 F (36.9 C), temperature source Oral, resp. rate (!) 24, height 5\' 7"  (1.702 m), weight 246 lb 3.2 oz (111.7 kg), SpO2 100 %. ECOG: 1 General appearance: Alert, awake gentleman without distress. Head: Normocephalic, without obvious abnormality no oral ulcers.. Neck: no adenopathy or masses. Lymph nodes: Cervical, supraclavicular, and axillary nodes normal. Heart:regular rate and rhythm, S1, S2 normal, no murmur, click, rub or gallop Lung:chest clear, no wheezing, rales, normal symmetric  air entry.  Abdomin: soft, non-tender, without masses or organomegaly without shifting dullness or ascites. EXT:no erythema, induration, or nodules   Lab Results: Lab Results  Component Value Date   WBC 11.6 (H) 09/10/2017   HGB 15.2 09/10/2017   HCT 45.5 09/10/2017   MCV 84.7 09/10/2017   PLT Clumped  Platelets--Appears Adequate 09/10/2017     Chemistry      Component Value Date/Time   NA 142 09/10/2017 1509   K 4.2 09/10/2017 1509   CO2 25 09/10/2017 1509   BUN 19.4 09/10/2017 1509   CREATININE 1.2 09/10/2017 1509      Component Value Date/Time   CALCIUM 9.7 09/10/2017 1509   ALKPHOS 84 09/10/2017 1509   AST 14 09/10/2017 1509   ALT 18 09/10/2017 1509   BILITOT 0.38 09/10/2017 1509      Results for KEISTON, MANLEY (MRN [id number]) as of 09/10/2017 15:24  Ref. Range 07/04/2017 09:00 08/06/2017 12:38  Prostate Specific Ag, Serum Latest Ref Range: 0.0 - 4.0 ng/mL 3.2 0.6    Impression and Plan:  67 year old gentleman with the following issues:  1. Prostate cancer diagnosed in August 2018. His PSA is 14.7 and a Gleason score 4+5 = 9 with high-volume disease and extraprostatic extension. Staging workup included a bone scan which showed no evidence of disease. His CT scan did show pelvic and retroperitoneal adenopathy that are likely due to metastatic disease rather than reactive in nature.  Patient was discussed in the prostate cancer multidisciplinary clinic in September 2018 and felt that systemic therapy is recommended.  He is currently receiving androgen deprivation therapy with Dr. Jeffie Pollock at [facility].  He started Zytiga at 1000 mg daily and prednisone in October 2018.  His PSA showed excellent response currently at 0.6 and a decline from 3.2 in October 2018.  2.  Androgen deprivation therapy:He is currently receiving it under the care of Dr. Jeffie Pollock.  This to be continued indefinitely.  3.  Hypertension: His blood pressure remains elevated here but within normal range outside of his MD visits.  I asked him to continue to monitor this periodically.  4.  Follow-up: Will be in 7 weeks to follow his progress.     Zola Button, MD 1/2/20193:51 PM

## 2017-09-10 NOTE — Telephone Encounter (Signed)
Scheduled appt per 1/2 los. - gave patient avs and calendar with appts.

## 2017-09-11 ENCOUNTER — Telehealth: Payer: Self-pay | Admitting: *Deleted

## 2017-09-11 LAB — PSA: Prostate Specific Ag, Serum: 0.2 ng/mL (ref 0.0–4.0)

## 2017-09-11 NOTE — Telephone Encounter (Signed)
-----   Message from Wyatt Portela, MD sent at 09/11/2017  8:07 AM EST ----- Please let him know his PSA is still coming down.

## 2017-09-11 NOTE — Telephone Encounter (Signed)
Spoke with patient, gave results of last PSA 

## 2017-09-28 DIAGNOSIS — J101 Influenza due to other identified influenza virus with other respiratory manifestations: Secondary | ICD-10-CM | POA: Diagnosis not present

## 2017-09-28 DIAGNOSIS — R509 Fever, unspecified: Secondary | ICD-10-CM | POA: Diagnosis not present

## 2017-10-03 ENCOUNTER — Other Ambulatory Visit: Payer: Self-pay | Admitting: *Deleted

## 2017-10-03 DIAGNOSIS — C61 Malignant neoplasm of prostate: Secondary | ICD-10-CM

## 2017-10-03 MED ORDER — PREDNISONE 5 MG PO TABS: 5.0000 mg | ORAL_TABLET | Freq: Every day | ORAL | 3 refills | Status: DC

## 2017-10-10 ENCOUNTER — Other Ambulatory Visit: Payer: Self-pay | Admitting: *Deleted

## 2017-10-10 ENCOUNTER — Other Ambulatory Visit: Payer: Self-pay | Admitting: Oncology

## 2017-10-10 DIAGNOSIS — C61 Malignant neoplasm of prostate: Secondary | ICD-10-CM

## 2017-10-10 MED ORDER — ABIRATERONE ACETATE 250 MG PO TABS: ORAL_TABLET | ORAL | 11 refills | Status: DC

## 2017-11-04 ENCOUNTER — Inpatient Hospital Stay: Payer: Managed Care, Other (non HMO)

## 2017-11-04 ENCOUNTER — Inpatient Hospital Stay: Payer: Managed Care, Other (non HMO) | Attending: Oncology | Admitting: Oncology

## 2017-11-04 ENCOUNTER — Telehealth: Payer: Self-pay | Admitting: *Deleted

## 2017-11-04 ENCOUNTER — Telehealth: Payer: Self-pay

## 2017-11-04 VITALS — BP 165/86 | HR 79 | Temp 98.7°F | Resp 18 | Ht 67.0 in | Wt 248.5 lb

## 2017-11-04 DIAGNOSIS — I10 Essential (primary) hypertension: Secondary | ICD-10-CM | POA: Diagnosis not present

## 2017-11-04 DIAGNOSIS — R635 Abnormal weight gain: Secondary | ICD-10-CM | POA: Insufficient documentation

## 2017-11-04 DIAGNOSIS — C61 Malignant neoplasm of prostate: Secondary | ICD-10-CM

## 2017-11-04 DIAGNOSIS — Z79899 Other long term (current) drug therapy: Secondary | ICD-10-CM | POA: Diagnosis not present

## 2017-11-04 DIAGNOSIS — R590 Localized enlarged lymph nodes: Secondary | ICD-10-CM | POA: Insufficient documentation

## 2017-11-04 LAB — CBC WITH DIFFERENTIAL/PLATELET
BASOS ABS: 0 10*3/uL (ref 0.0–0.1)
Basophils Relative: 0 %
EOS PCT: 3 %
Eosinophils Absolute: 0.2 10*3/uL (ref 0.0–0.5)
HEMATOCRIT: 42.8 % (ref 38.4–49.9)
Hemoglobin: 14.3 g/dL (ref 13.0–17.1)
LYMPHS ABS: 1.3 10*3/uL (ref 0.9–3.3)
LYMPHS PCT: 19 %
MCH: 28.8 pg (ref 27.2–33.4)
MCHC: 33.4 g/dL (ref 32.0–36.0)
MCV: 86.3 fL (ref 79.3–98.0)
MONO ABS: 0.4 10*3/uL (ref 0.1–0.9)
MONOS PCT: 6 %
NEUTROS ABS: 4.9 10*3/uL (ref 1.5–6.5)
Neutrophils Relative %: 72 %
PLATELETS: 194 10*3/uL (ref 140–400)
RBC: 4.96 MIL/uL (ref 4.20–5.82)
RDW: 14.1 % (ref 11.0–14.6)
WBC: 6.9 10*3/uL (ref 4.0–10.3)

## 2017-11-04 LAB — COMPREHENSIVE METABOLIC PANEL
ALBUMIN: 3.5 g/dL (ref 3.5–5.0)
ALK PHOS: 77 U/L (ref 40–150)
ALT: 13 U/L (ref 0–55)
AST: 11 U/L (ref 5–34)
Anion gap: 10 (ref 3–11)
BILIRUBIN TOTAL: 0.4 mg/dL (ref 0.2–1.2)
BUN: 20 mg/dL (ref 7–26)
CO2: 25 mmol/L (ref 22–29)
Calcium: 9.4 mg/dL (ref 8.4–10.4)
Chloride: 106 mmol/L (ref 98–109)
Creatinine, Ser: 1.14 mg/dL (ref 0.70–1.30)
GFR calc Af Amer: >60 mL/min (ref >60)
GFR calc non Af Amer: >60 mL/min (ref >60)
GLUCOSE: 171 mg/dL — AB (ref 70–140)
POTASSIUM: 3.7 mmol/L (ref 3.5–5.1)
SODIUM: 141 mmol/L (ref 136–145)
TOTAL PROTEIN: 6.5 g/dL (ref 6.4–8.3)

## 2017-11-04 NOTE — Telephone Encounter (Signed)
No note

## 2017-11-04 NOTE — Telephone Encounter (Signed)
Printed avs and calender of upcoming appointment.  Per 2/26 los 

## 2017-11-04 NOTE — Progress Notes (Signed)
Hematology and Oncology Follow Up Visit  Bobby [name] [id number] Jul 26, 1951 67 y.o. 11/04/2017 8:34 AM Alroy Dust, L.Dean, MDMitchell, L.Marlou Sa, MD   Principle Diagnosis: 67 year old man with hormone sensitive advanced prostate cancer diagnosed in August 2018. His PSA is 14.7 and a Gleason score 4+5 = 9.  Imaging study showed retroperitoneal bulky adenopathy.   Prior Therapy: He is status post prostate biopsy in August 2018.  Current therapy:  Androgen deprivation therapy under the care of Dr. Jeffie Pollock started in September 2018.  Zytiga 1000 mg daily with prednisone 5 mg daily started in October 2018.  Interim History: [name] is here for a follow-up.  Since the last visit, he continues to be in reasonably good health and shape.  He has gained more weight on prednisone and trying to increase his exercise level and watch his diet.  He denies any complications related to Zytiga.  He denies any nausea, excessive fatigue or joint swelling.  He denies any difficulties obtaining this medication and taking on a regular basis.  He does not report any back pain, shoulder pain or pathological fractures.  He denies any new urinary symptoms including hematuria or dysuria.  His performance status and quality of life is unchanged.  He does report difficulty sleeping at times related to prednisone as well as sometimes interrupted by nocturia.   He does not report any headaches, blurry vision, syncope or seizures. He does not report any fevers, chills, sweats or weight loss. He does not report any chest pain, palpitation, orthopnea or leg edema. He does not report any cough, wheezing or hemoptysis. He does not report any nausea, vomiting or abdominal pain. He does not report any hematochezia or melena. He does not report any hematuria or dysuria.  He does not report any lymphadenopathy or petechiae.  He does not report any heat or cold intolerance.  He does not report any anxiety or depression.  He does not report  any skin rashes or lesions.  Remaining review of systems is negative.  Medications: I have reviewed the patient's current medications.  Current Outpatient Medications  Medication Sig Dispense Refill  . abiraterone acetate (ZYTIGA) 250 MG tablet TAKE 4 TABLETS BY MOUTH EVERY DAY (DO NOT EAT STARTING 2 HOURS BEFORE AND FOR 1 HOUR AFTER TAKING) 120 tablet 11  . lidocaine (LIDODERM) 5 % Place 1 patch onto the skin daily. Remove & Discard patch within 12 hours or as directed by MD    . Oxycodone HCl 10 MG TABS Take 1 tablet (10 mg total) by mouth every 4 (four) hours as needed. (Patient not taking: Reported on 05/23/2017) 20 tablet 0  . predniSONE (DELTASONE) 5 MG tablet Take 1 tablet (5 mg total) by mouth daily with breakfast. 30 tablet 3  . tamsulosin (FLOMAX) 0.4 MG CAPS capsule Take 1 capsule (0.4 mg total) by mouth daily after supper. 30 capsule 0   No current facility-administered medications for this visit.      Allergies: No Known Allergies  Past Medical History, Surgical history, Social history, and Family History reviewed again and unchanged.    Physical Exam: Blood pressure (!) 165/86, pulse 79, temperature 98.7 F (37.1 C), temperature source Oral, resp. rate 18, height 5\' 7"  (1.702 m), weight 248 lb 8 oz (112.7 kg), SpO2 98 %.   ECOG: 1 General appearance: Well-appearing gentleman appeared comfortable. Head: Normocephalic, without obvious abnormality or trauma. Oropharynx: Mucous membranes are moist and pink. Eyes: No scleral icterus. Lymph nodes: Cervical, supraclavicular, and axillary nodes normal.  Heart:regular rate and rhythm, S1, S2 normal, no murmur, click, rub or gallop Lung: Clear to auscultation without rhonchi, wheezes or dullness to percussion. Abdomin: Soft, nontender, nondistended with good bowel sounds.  No rebound or guarding. Musculoskeletal: No joint deformity or effusion. Skin: No rashes or lesions. Neurological: No deficits motor, sensory or deep tendon  reflexes.   Lab Results: Lab Results  Component Value Date   WBC 11.6 (H) 09/10/2017   HGB 15.2 09/10/2017   HCT 45.5 09/10/2017   MCV 84.7 09/10/2017   PLT Clumped Platelets--Appears Adequate 09/10/2017     Chemistry      Component Value Date/Time   NA 142 09/10/2017 1509   K 4.2 09/10/2017 1509   CO2 25 09/10/2017 1509   BUN 19.4 09/10/2017 1509   CREATININE 1.2 09/10/2017 1509      Component Value Date/Time   CALCIUM 9.7 09/10/2017 1509   ALKPHOS 84 09/10/2017 1509   AST 14 09/10/2017 1509   ALT 18 09/10/2017 1509   BILITOT 0.38 09/10/2017 1509     Results for BALEN, WOOLUM (MRN [id number]) as of 11/04/2017 08:09  Ref. Range 08/06/2017 12:38 09/10/2017 15:09  Prostate Specific Ag, Serum Latest Ref Range: 0.0 - 4.0 ng/mL 0.6 0.2      Impression and Plan:  67 year old gentleman with the following issues:  1.  Advanced hormone sensitive prostate cancer diagnosed in August 2018. His PSA is 14.7 and a Gleason score 4+5 = 9 with high-volume disease and extraprostatic extension.  He has retroperitoneal adenopathy detected.   He is currently on Zytiga at 1000 mg daily and prednisone in October 2018 without any major complications.  He continues to experience excellent clinical response with PSA at 0.2 and no complications.  The natural course of this disease was discussed today with the patient.  Risks and benefits of continuing this therapy long-term was also discussed including long-term complications associated with Zytiga and prednisone.  His complications including weight gain osteoporosis as well as electrolyte imbalance.  After discussion today he is agreeable to continue given the overall benefits associated with this therapy.  Considerations to stop prednisone in the future will be discussed.  2.  Androgen deprivation therapy: He is scheduled to receive a Lupron in May 2019.  He is currently receiving it under the care of Dr. Jeffie Pollock.   3.  Hypertension: No major  changes in his blood pressure medication at this time.  He is checking his blood pressure periodically and close to normal range between visits.  4.  Electrolyte imbalance: His potassium continues to be normal will continue to be checked periodically.  5.  Weight gain: Related to prednisone and we will consider discontinuation of prednisone moving forward.  6  Follow-up: Will be in 8 weeks.   25  minutes was spent with the patient face-to-face today.  More than 50% of time was dedicated to patient counseling, education and coordination of his multifaceted care.    Zola Button, MD 2/26/20198:34 AM

## 2017-11-05 ENCOUNTER — Telehealth: Payer: Self-pay | Admitting: *Deleted

## 2017-11-05 LAB — PROSTATE-SPECIFIC AG, SERUM (LABCORP)

## 2017-11-05 NOTE — Telephone Encounter (Signed)
-----   Message from Wyatt Portela, MD sent at 11/05/2017  8:49 AM EST ----- Please let him know his PSA is low

## 2017-11-05 NOTE — Telephone Encounter (Signed)
Spoke with patient, gave results of last PSA 

## 2017-12-30 ENCOUNTER — Inpatient Hospital Stay: Payer: Managed Care, Other (non HMO)

## 2017-12-30 ENCOUNTER — Telehealth: Payer: Self-pay | Admitting: Oncology

## 2017-12-30 ENCOUNTER — Inpatient Hospital Stay: Payer: Managed Care, Other (non HMO) | Attending: Oncology | Admitting: Oncology

## 2017-12-30 VITALS — BP 167/86 | HR 79 | Temp 98.0°F | Resp 18 | Ht 67.0 in | Wt 247.5 lb

## 2017-12-30 DIAGNOSIS — C61 Malignant neoplasm of prostate: Secondary | ICD-10-CM | POA: Diagnosis not present

## 2017-12-30 DIAGNOSIS — I10 Essential (primary) hypertension: Secondary | ICD-10-CM | POA: Insufficient documentation

## 2017-12-30 DIAGNOSIS — Z79899 Other long term (current) drug therapy: Secondary | ICD-10-CM | POA: Diagnosis not present

## 2017-12-30 DIAGNOSIS — G4700 Insomnia, unspecified: Secondary | ICD-10-CM | POA: Diagnosis not present

## 2017-12-30 LAB — CBC WITH DIFFERENTIAL (CANCER CENTER ONLY)
BASOS PCT: 1 %
Basophils Absolute: 0.1 10*3/uL (ref 0.0–0.1)
Eosinophils Absolute: 0.2 10*3/uL (ref 0.0–0.5)
Eosinophils Relative: 3 %
HEMATOCRIT: 43.3 % (ref 38.4–49.9)
Hemoglobin: 14.7 g/dL (ref 13.0–17.1)
Lymphocytes Relative: 17 %
Lymphs Abs: 1.3 10*3/uL (ref 0.9–3.3)
MCH: 28.1 pg (ref 27.2–33.4)
MCHC: 34 g/dL (ref 32.0–36.0)
MCV: 82.6 fL (ref 79.3–98.0)
MONO ABS: 0.5 10*3/uL (ref 0.1–0.9)
Monocytes Relative: 6 %
NEUTROS ABS: 5.3 10*3/uL (ref 1.5–6.5)
NEUTROS PCT: 73 %
PLATELETS: 212 10*3/uL (ref 140–400)
RBC: 5.24 MIL/uL (ref 4.20–5.82)
RDW: 14.3 % (ref 11.0–14.6)
WBC: 7.3 10*3/uL (ref 4.0–10.3)

## 2017-12-30 LAB — CMP (CANCER CENTER ONLY)
ALT: 15 U/L (ref 0–55)
AST: 15 U/L (ref 5–34)
Albumin: 3.6 g/dL (ref 3.5–5.0)
Alkaline Phosphatase: 74 U/L (ref 40–150)
Anion gap: 11 (ref 3–11)
BILIRUBIN TOTAL: 0.4 mg/dL (ref 0.2–1.2)
BUN: 20 mg/dL (ref 7–26)
CHLORIDE: 108 mmol/L (ref 98–109)
CO2: 22 mmol/L (ref 22–29)
Calcium: 9.4 mg/dL (ref 8.4–10.4)
Creatinine: 1.17 mg/dL (ref 0.70–1.30)
GFR, Est AFR Am: >60 mL/min (ref >60)
GFR, Estimated: >60 mL/min (ref >60)
Glucose, Bld: 166 mg/dL — ABNORMAL HIGH (ref 70–140)
POTASSIUM: 3.4 mmol/L — AB (ref 3.5–5.1)
Sodium: 141 mmol/L (ref 136–145)
Total Protein: 6.7 g/dL (ref 6.4–8.3)

## 2017-12-30 NOTE — Progress Notes (Signed)
Hematology and Oncology Follow Up Visit  Bobby [name] [id number] Nov 11, 1950 67 y.o. 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  He presented with Gleason score 4+5 = 9, PSA of 14.7.    Prior Therapy: He is status post prostate biopsy in August 2018.  Current therapy:  Androgen deprivation therapy under the care of Dr. Jeffie Pollock started in September 2018.  Zytiga 1000 mg daily with prednisone 5 mg daily started in October 2018.  Interim History: [name] presents today for a follow-up.  He reports feeling reasonably well without any major changes since the last visit.  He does report grade 1 fatigue as well as nocturia which has not worsened over the last few months.  He reports not to the bathroom every 2 hours at nighttime which has been disruptive to his sleep habits.  He denies any dysuria or hematuria.  He continues to perform activities of daily living including working full-time job.  He continues to take Zytiga without any other complications.  He denies any lower extremity edema, GI complaints or bone pain.  He denies any pathological fractures or hospitalizations.  He also takes prednisone on a daily basis without any new complications.  He does not report any headaches, blurry vision, syncope or neuropathy. He does not report any fevers, chills, sweats or weight loss. He does not report any chest pain, palpitation, orthopnea.. He does not report any cough, wheezing or hemoptysis.  He denies dyspnea on exertion.  He does not report any nausea, vomiting or abdominal pain. He does not report any hematochezia or melena. He does not report any hematuria or dysuria.  He does not report any lymphadenopathy or petechiae.  He does not report any changes in his mood.  He does not report any skin rashes or lesions.  Remaining review of systems is  negative.  Medications: I have reviewed the patient's current medications.  Current Outpatient Medications  Medication Sig Dispense Refill  . abiraterone acetate (ZYTIGA) 250 MG tablet TAKE 4 TABLETS BY MOUTH EVERY DAY (DO NOT EAT STARTING 2 HOURS BEFORE AND FOR 1 HOUR AFTER TAKING) 120 tablet 11  . ipratropium (ATROVENT) 0.06 % nasal spray     . predniSONE (DELTASONE) 5 MG tablet Take 1 tablet (5 mg total) by mouth daily with breakfast. 30 tablet 3  . tamsulosin (FLOMAX) 0.4 MG CAPS capsule Take 1 capsule (0.4 mg total) by mouth daily after supper. 30 capsule 0   No current facility-administered medications for this visit.      Allergies: No Known Allergies  Past Medical History, Surgical history, Social history, and Family History reviewed again and unchanged.    Physical Exam: Blood pressure (!) 167/86, pulse 79, temperature 98 F (36.7 C), temperature source Oral, resp. rate 18, height 5\' 7"  (1.702 m), weight 247 lb 8 oz (112.3 kg), SpO2 98 %.    ECOG: 1 General appearance: Alert, awake gentleman appeared comfortable. Head: Atraumatic without abnormalities. Oropharynx: No thrush or ulcers. Eyes: Pupils are equal and round reactive to light. Lymph nodes: No lymphadenopathy palpated in the cervical, supraclavicular, and axillary nodes.  Heart:regular rate without any murmurs or gallops. Lung: Clear in all lung fields without any wheezes or dullness to percussion. Abdomin: Soft, nontender with good bowel sounds Musculoskeletal: No clubbing or cyanosis. Skin: No ecchymosis or petechiae. Neurological: Intact motor, sensory exam.  Ambulating without difficulties.   Lab Results: Lab  Results  Component Value Date   WBC 6.9 11/04/2017   HGB 14.3 11/04/2017   HCT 42.8 11/04/2017   MCV 86.3 11/04/2017   PLT 194 11/04/2017     Chemistry      Component Value Date/Time   NA 141 11/04/2017 0815   NA 142 09/10/2017 1509   K 3.7 11/04/2017 0815   K 4.2 09/10/2017 1509   CL  106 11/04/2017 0815   CO2 25 11/04/2017 0815   CO2 25 09/10/2017 1509   BUN 20 11/04/2017 0815   BUN 19.4 09/10/2017 1509   CREATININE 1.14 11/04/2017 0815   CREATININE 1.2 09/10/2017 1509      Component Value Date/Time   CALCIUM 9.4 11/04/2017 0815   CALCIUM 9.7 09/10/2017 1509   ALKPHOS 77 11/04/2017 0815   ALKPHOS 84 09/10/2017 1509   AST 11 11/04/2017 0815   AST 14 09/10/2017 1509   ALT 13 11/04/2017 0815   ALT 18 09/10/2017 1509   BILITOT 0.4 11/04/2017 0815   BILITOT 0.38 09/10/2017 1509      Results for Bobby, [name] (MRN [id number]) as of 12/30/2017 07:54  Ref. Range 09/10/2017 15:09 11/04/2017 08:15  Prostate Specific Ag, Serum Latest Ref Range: 0.0 - 4.0 ng/mL 0.2 <0.1      Impression and Plan:  67 year old gentleman with the following issues:  1.  Castration-sensitive prostate cancer with disease in the lymph nodes diagnosed in August 2018.   He continues to on Zytiga in addition to androgen deprivation without any recent complications.  His PSA continues to be undetectable as of February 2019.  He is experiencing very mild complications related to Zytiga and prednisone.  Risks and benefits of continuing this medication was reviewed today and is agreeable to continue.  We have elected to decrease the prednisone dose to every other day for better tolerance.  I anticipate improvement of his insomnia and frequency with decreased dose of prednisone.  2.  Androgen deprivation therapy: Risks and benefits of continuing this therapy indefinitely was reviewed today.  He will continue to receive Lupron under the care of Dr. Jeffie Pollock and scheduled for his next one in May 2019.  3.  Hypertension: Her blood pressure is mildly elevated today although per his report it is usually not elevated between visits.  I have asked him to check his blood pressure routinely at home and record these readings.  Antihypertensive medication may be needed to Zytiga.  4.  Electrolyte and liver  function test monitoring: These need to be done periodically on Zytiga.  His LFTs and potassium has been within normal range.  5.  Weight gain: He has lost 1 pound since last visit.  We have discussed strategies to improve his exercise level and decrease in prednisone dose will help.  6  Follow-up: Will be in 8 weeks.   25  minutes was spent with the patient face-to-face today.  More than 50% of time was dedicated to patient counseling, education and answering questions regarding future plan of care.    Zola Button, MD 4/23/20198:30 AM

## 2017-12-30 NOTE — Telephone Encounter (Signed)
Scheduled appt per 4/23 los - Gave patient aVS and calender per los.

## 2017-12-31 ENCOUNTER — Telehealth: Payer: Self-pay | Admitting: *Deleted

## 2017-12-31 LAB — PROSTATE-SPECIFIC AG, SERUM (LABCORP): Prostate Specific Ag, Serum: <0.1 ng/mL (ref 0.0–4.0)

## 2017-12-31 NOTE — Telephone Encounter (Signed)
-----   Message from Wyatt Portela, MD sent at 12/31/2017  8:01 AM EDT ----- Please let him know his PSA is still very low

## 2017-12-31 NOTE — Telephone Encounter (Signed)
Spoke with patient, gave results of last PSA 

## 2018-01-12 DIAGNOSIS — Z5111 Encounter for antineoplastic chemotherapy: Secondary | ICD-10-CM | POA: Diagnosis not present

## 2018-01-12 DIAGNOSIS — C61 Malignant neoplasm of prostate: Secondary | ICD-10-CM | POA: Diagnosis not present

## 2018-01-12 DIAGNOSIS — C778 Secondary and unspecified malignant neoplasm of lymph nodes of multiple regions: Secondary | ICD-10-CM | POA: Diagnosis not present

## 2018-01-12 DIAGNOSIS — R351 Nocturia: Secondary | ICD-10-CM | POA: Diagnosis not present

## 2018-01-15 ENCOUNTER — Encounter: Payer: Self-pay | Admitting: *Deleted

## 2018-01-22 ENCOUNTER — Other Ambulatory Visit: Payer: Self-pay | Admitting: *Deleted

## 2018-01-22 DIAGNOSIS — C61 Malignant neoplasm of prostate: Secondary | ICD-10-CM

## 2018-01-22 MED ORDER — PREDNISONE 5 MG PO TABS: 5.0000 mg | ORAL_TABLET | Freq: Every day | ORAL | 3 refills | Status: DC

## 2018-03-03 ENCOUNTER — Inpatient Hospital Stay: Payer: Managed Care, Other (non HMO) | Attending: Oncology | Admitting: Oncology

## 2018-03-03 ENCOUNTER — Inpatient Hospital Stay: Payer: Managed Care, Other (non HMO)

## 2018-03-03 ENCOUNTER — Telehealth: Payer: Self-pay

## 2018-03-03 VITALS — BP 167/88 | HR 78 | Temp 99.0°F | Resp 17 | Ht 67.0 in | Wt 253.4 lb

## 2018-03-03 DIAGNOSIS — E2740 Unspecified adrenocortical insufficiency: Secondary | ICD-10-CM | POA: Insufficient documentation

## 2018-03-03 DIAGNOSIS — R5383 Other fatigue: Secondary | ICD-10-CM | POA: Insufficient documentation

## 2018-03-03 DIAGNOSIS — C61 Malignant neoplasm of prostate: Secondary | ICD-10-CM

## 2018-03-03 DIAGNOSIS — R635 Abnormal weight gain: Secondary | ICD-10-CM | POA: Insufficient documentation

## 2018-03-03 DIAGNOSIS — Z79899 Other long term (current) drug therapy: Secondary | ICD-10-CM | POA: Insufficient documentation

## 2018-03-03 DIAGNOSIS — I10 Essential (primary) hypertension: Secondary | ICD-10-CM | POA: Diagnosis not present

## 2018-03-03 LAB — CBC WITH DIFFERENTIAL (CANCER CENTER ONLY)
Basophils Absolute: 0 10*3/uL (ref 0.0–0.1)
Basophils Relative: 0 %
Eosinophils Absolute: 0.3 10*3/uL (ref 0.0–0.5)
Eosinophils Relative: 4 %
HCT: 41.1 % (ref 38.4–49.9)
Hemoglobin: 14 g/dL (ref 13.0–17.1)
Lymphocytes Relative: 19 %
Lymphs Abs: 1.4 10*3/uL (ref 0.9–3.3)
MCH: 27.8 pg (ref 27.2–33.4)
MCHC: 34 g/dL (ref 32.0–36.0)
MCV: 81.7 fL (ref 79.3–98.0)
Monocytes Absolute: 0.5 10*3/uL (ref 0.1–0.9)
Monocytes Relative: 8 %
Neutro Abs: 5.1 10*3/uL (ref 1.5–6.5)
Neutrophils Relative %: 69 %
Platelet Count: 192 10*3/uL (ref 140–400)
RBC: 5.04 MIL/uL (ref 4.20–5.82)
RDW: 14.6 % (ref 11.0–14.6)
WBC Count: 7.3 10*3/uL (ref 4.0–10.3)

## 2018-03-03 LAB — CMP (CANCER CENTER ONLY)
ALT: 15 U/L (ref 0–44)
AST: 14 U/L — ABNORMAL LOW (ref 15–41)
Albumin: 3.6 g/dL (ref 3.5–5.0)
Alkaline Phosphatase: 78 U/L (ref 38–126)
Anion gap: 10 (ref 5–15)
BUN: 18 mg/dL (ref 8–23)
CO2: 23 mmol/L (ref 22–32)
Calcium: 9.5 mg/dL (ref 8.9–10.3)
Chloride: 108 mmol/L (ref 98–111)
Creatinine: 1.07 mg/dL (ref 0.61–1.24)
GFR, Est AFR Am: >60 mL/min (ref >60)
GFR, Estimated: >60 mL/min (ref >60)
Glucose, Bld: 122 mg/dL — ABNORMAL HIGH (ref 70–99)
Potassium: 3.8 mmol/L (ref 3.5–5.1)
Sodium: 141 mmol/L (ref 135–145)
Total Bilirubin: 0.4 mg/dL (ref 0.3–1.2)
Total Protein: 6.5 g/dL (ref 6.5–8.1)

## 2018-03-03 NOTE — Progress Notes (Signed)
Hematology and Oncology Follow Up Visit  Bobby [name] [id number] 1951/05/26 67 y.o. 03/03/2018 8:04 AM Bobby [name], L.Dean, MDMitchell, L.Marlou Sa, MD   Principle Diagnosis: 67 year old man with castration-sensitive prostate cancer diagnosed in August 2018 with Gleason score 4+5 = 9, PSA of 14.7.  He has pelvic adenopathy that was documented at the time of diagnosis.   Prior Therapy: He is status post prostate biopsy in August 2018.  Current therapy:  Androgen deprivation therapy under the care of Dr. Jeffie Pollock started in September 2018.  Zytiga 1000 mg daily with prednisone 5 mg daily started in October 2018.  Interim History: Bobby [name] is here for a follow-up.  Since her last visit, he reports no major changes in his health.  He continues to have issues with weight gain and increased fatigue.  He reports his exercise tolerance and stamina has decreased since the start of androgen deprivation and Zytiga.  He continues to work full-time although he does take a nap periodically in the middle of the day.  He denies any worsening bone pain or back pain.  He still able to drive and attends to activities of daily living.  He does not report any specific complaints related to Zytiga.  He denies any dyspepsia or leg edema.  His appetite remain excellent and continues to gain weight.  He does report frequency at nighttime which is contributing to his fatigue during the day because of disrupted sleep.  He does not report any headaches, blurry vision, syncope or seizures.  He denies any alteration in status or confusion.  He does not report any fevers, chills, sweats or weight loss. He does not report any chest pain, palpitation, orthopnea.. He does not report any cough, wheezing or hemoptysis.  He does not report any nausea, vomiting or abdominal pain. He does not report any constipation or diarrhea.  He does not report any hematuria or dysuria.  He denies any urgency.  He does not report any lymphadenopathy or  petechiae.  He does not report any anxiety or depression.  He does not report any bleeding or clotting tendencies.  Remaining review of systems is negative.  Medications: I have reviewed the patient's current medications.  Current Outpatient Medications  Medication Sig Dispense Refill  . abiraterone acetate (ZYTIGA) 250 MG tablet TAKE 4 TABLETS BY MOUTH EVERY DAY (DO NOT EAT STARTING 2 HOURS BEFORE AND FOR 1 HOUR AFTER TAKING) 120 tablet 11  . predniSONE (DELTASONE) 5 MG tablet Take 1 tablet (5 mg total) by mouth daily with breakfast. 30 tablet 3  . tamsulosin (FLOMAX) 0.4 MG CAPS capsule Take 1 capsule (0.4 mg total) by mouth daily after supper. 30 capsule 0   No current facility-administered medications for this visit.      Allergies: No Known Allergies  Past Medical History, Surgical history, Social history, and Family History reviewed again and unchanged.    Physical Exam:  Blood pressure (!) 167/88, pulse 78, temperature 99 F (37.2 C), temperature source Oral, resp. rate 17, height 5\' 7"  (1.702 m), weight 253 lb 6.4 oz (114.9 kg), SpO2 96 %.    ECOG: 1 General appearance: Well-appearing gentleman without distress. Head: Normocephalic without abnormalities. Oropharynx: Mucous membranes are moist and pink. Eyes: Sclera anicteric. Lymph nodes: No cervical, supraclavicular, inguinal or axillary nodes.  Heart:regular rate and rhythm without any murmurs.  No leg edema. Lung: Clear without any rhonchi, wheezes or dullness to percussion. Abdomin: Soft, obese without any rebound or guarding.  Good bowel sounds without shifting dullness.  Musculoskeletal: No joint deformity or effusion. Skin: Moist without any ecchymosis petechiae. Neurological: No deficits noted.  Intact motor, sensory exam.   Lab Results: Lab Results  Component Value Date   WBC 7.3 03/03/2018   HGB 14.0 03/03/2018   HCT 41.1 03/03/2018   MCV 81.7 03/03/2018   PLT 192 03/03/2018     Chemistry       Component Value Date/Time   NA 141 12/30/2017 0821   NA 142 09/10/2017 1509   K 3.4 (L) 12/30/2017 0821   K 4.2 09/10/2017 1509   CL 108 12/30/2017 0821   CO2 22 12/30/2017 0821   CO2 25 09/10/2017 1509   BUN 20 12/30/2017 0821   BUN 19.4 09/10/2017 1509   CREATININE 1.17 12/30/2017 0821   CREATININE 1.2 09/10/2017 1509      Component Value Date/Time   CALCIUM 9.4 12/30/2017 0821   CALCIUM 9.7 09/10/2017 1509   ALKPHOS 74 12/30/2017 0821   ALKPHOS 84 09/10/2017 1509   AST 15 12/30/2017 0821   AST 14 09/10/2017 1509   ALT 15 12/30/2017 0821   ALT 18 09/10/2017 1509   BILITOT 0.4 12/30/2017 0821   BILITOT 0.38 09/10/2017 1509       Results for Bobby [name], Bobby [name] (MRN [id number]) as of 03/03/2018 07:59  Ref. Range 11/04/2017 08:15 12/30/2017 08:21  Prostate Specific Ag, Serum Latest Ref Range: 0.0 - 4.0 ng/mL <0.1 <0.1     Impression and Plan:  67 year old gentleman with the following issues:  1.  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  He presented with lymphadenopathy and no bony metastasis. diagnosed in August 2018.  The natural course of this disease was reviewed again with the patient as well as treatment options.  He remains on Zytiga without any specific complications related to this medication.  He does report increased fatigue but his fatigue remains manageable.  Long-term complication associated with Zytiga were reviewed again today these include worsening fatigue, electrolyte imbalance and adrenal insufficiency.  At this time, he is willing to continue and will continue monitor for side effects related to this treatment.  2.  Androgen deprivation therapy: He is receiving this at [facility] urology under the care of Dr. Jeffie Pollock.  Long-term complications associated with this therapy was reviewed today which include osteoporosis and continued weight gain.  3.  Hypertension: His blood pressure is elevated today although and has been normal with  between visits.  I asked him to continue to monitor his blood pressure between visits consideration for antihypertensive medication in the future if this issue persists.  4.  Electrolyte and liver function test monitoring: Liver function test and potassium continue to be monitored.  Will replace potassium if needed.  5.  Weight gain: We continue to discuss strategies to improve this issue.  We discussed about diet modification as well as increase exercise activity.  Continue to emphasize the importance of healthy diet and exercise.  6.  Prognosis: He has an incurable malignancy but disease will be palliated for an extended period of time.  Performance status remains excellent and aggressive therapy is warranted.  7  Follow-up: Will be in 8 weeks.   25  minutes was spent with the patient face-to-face today.  More than 50% of time was dedicated to patient counseling, education and discussing the natural course of this disease, future options and management strategies to cope with treatments he is receiving.    Zola Button, MD 6/25/20198:04 AM

## 2018-03-03 NOTE — Telephone Encounter (Signed)
Printed avs and calender of upcoming appointment. Per 6/25 los

## 2018-03-04 ENCOUNTER — Telehealth: Payer: Self-pay | Admitting: *Deleted

## 2018-03-04 LAB — PROSTATE-SPECIFIC AG, SERUM (LABCORP): Prostate Specific Ag, Serum: <0.1 ng/mL (ref 0.0–4.0)

## 2018-03-04 NOTE — Telephone Encounter (Signed)
-----   Message from Wyatt Portela, MD sent at 03/04/2018  8:28 AM EDT ----- Please let him know his PSA is low

## 2018-03-04 NOTE — Telephone Encounter (Signed)
As noted below by Dr. Shadad, I informed patient of his PSA level. He verbalized understanding.  

## 2018-04-28 ENCOUNTER — Other Ambulatory Visit: Payer: Self-pay | Admitting: *Deleted

## 2018-04-28 DIAGNOSIS — C61 Malignant neoplasm of prostate: Secondary | ICD-10-CM

## 2018-04-28 MED ORDER — PREDNISONE 5 MG PO TABS: 5.0000 mg | ORAL_TABLET | Freq: Every day | ORAL | 3 refills | Status: DC

## 2018-05-05 ENCOUNTER — Inpatient Hospital Stay: Payer: Managed Care, Other (non HMO) | Attending: Oncology | Admitting: Oncology

## 2018-05-05 ENCOUNTER — Encounter: Payer: Self-pay | Admitting: Medical Oncology

## 2018-05-05 ENCOUNTER — Telehealth: Payer: Self-pay | Admitting: Oncology

## 2018-05-05 ENCOUNTER — Inpatient Hospital Stay: Payer: Managed Care, Other (non HMO)

## 2018-05-05 VITALS — BP 170/89 | HR 81 | Temp 98.7°F | Resp 18 | Ht 67.0 in | Wt 256.3 lb

## 2018-05-05 DIAGNOSIS — C61 Malignant neoplasm of prostate: Secondary | ICD-10-CM | POA: Diagnosis present

## 2018-05-05 DIAGNOSIS — I10 Essential (primary) hypertension: Secondary | ICD-10-CM

## 2018-05-05 DIAGNOSIS — C775 Secondary and unspecified malignant neoplasm of intrapelvic lymph nodes: Secondary | ICD-10-CM

## 2018-05-05 DIAGNOSIS — Z7952 Long term (current) use of systemic steroids: Secondary | ICD-10-CM | POA: Insufficient documentation

## 2018-05-05 DIAGNOSIS — Z79899 Other long term (current) drug therapy: Secondary | ICD-10-CM | POA: Diagnosis not present

## 2018-05-05 LAB — CBC WITH DIFFERENTIAL (CANCER CENTER ONLY)
BASOS ABS: 0 10*3/uL (ref 0.0–0.1)
Basophils Relative: 0 %
EOS ABS: 0.2 10*3/uL (ref 0.0–0.5)
Eosinophils Relative: 3 %
HCT: 41.2 % (ref 38.4–49.9)
Hemoglobin: 13.7 g/dL (ref 13.0–17.1)
LYMPHS ABS: 1.2 10*3/uL (ref 0.9–3.3)
Lymphocytes Relative: 17 %
MCH: 28 pg (ref 27.2–33.4)
MCHC: 33.3 g/dL (ref 32.0–36.0)
MCV: 84.3 fL (ref 79.3–98.0)
Monocytes Absolute: 0.5 10*3/uL (ref 0.1–0.9)
Monocytes Relative: 7 %
NEUTROS PCT: 73 %
Neutro Abs: 5 10*3/uL (ref 1.5–6.5)
PLATELETS: 181 10*3/uL (ref 140–400)
RBC: 4.89 MIL/uL (ref 4.20–5.82)
RDW: 14.6 % (ref 11.0–14.6)
WBC Count: 6.8 10*3/uL (ref 4.0–10.3)

## 2018-05-05 LAB — CMP (CANCER CENTER ONLY)
ALT: 19 U/L (ref 0–44)
AST: 18 U/L (ref 15–41)
Albumin: 3.6 g/dL (ref 3.5–5.0)
Alkaline Phosphatase: 83 U/L (ref 38–126)
Anion gap: 9 (ref 5–15)
BILIRUBIN TOTAL: 0.4 mg/dL (ref 0.3–1.2)
BUN: 16 mg/dL (ref 8–23)
CHLORIDE: 108 mmol/L (ref 98–111)
CO2: 27 mmol/L (ref 22–32)
CREATININE: 1.15 mg/dL (ref 0.61–1.24)
Calcium: 9.9 mg/dL (ref 8.9–10.3)
GFR, Est AFR Am: >60 mL/min (ref >60)
Glucose, Bld: 139 mg/dL — ABNORMAL HIGH (ref 70–99)
Potassium: 4 mmol/L (ref 3.5–5.1)
Sodium: 144 mmol/L (ref 135–145)
TOTAL PROTEIN: 6.6 g/dL (ref 6.5–8.1)

## 2018-05-05 NOTE — Progress Notes (Signed)
Hematology and Oncology Follow Up Visit  Bobby [name] [id number] 08-Jun-1951 67 y.o. 05/05/2018 8:24 AM Bobby [name], L.Dean, MDMitchell, Bobby Sa, MD   Principle Diagnosis: 67 year old man with advanced castration-sensitive prostate cancer after presenting with pelvic adenopathy.  He was found to have  Gleason score 4+5 = 9, PSA of 14.7 in August 2018.   Prior Therapy: He is status post prostate biopsy in August 2018.  Current therapy:  Androgen deprivation therapy under the care of Dr. Jeffie Pollock started in September 2018.  Zytiga 1000 mg daily with prednisone 5 mg daily started in October 2018.  Interim History: Bobby [name] presents today for a follow-up.  Since the last visit, he reports no major changes in his health.  He does report some mild fatigue and tiredness associated with Zytiga.  He does report arthralgias early in the morning but resolved as he gets moving.  He denies any nausea or changes in his appetite and he has gained weight since last visit.  He denies any urination difficulties as long as he is taking Flomax.  He denies any falls or syncope.  His performance status and quality of life remains excellent.  He does not report any headaches, blurry vision, syncope or seizures.  He denies any dizziness.  He does not report any fevers, chills, sweats.  He does not report any chest pain, palpitation, orthopnea.. He does not report any cough, wheezing or hemoptysis.  He does not report any nausea, vomiting or abdominal pain. He does not report any changes in bowel habits.  He does not report any hematuria or dysuria.  He denies any urgency.  He does not report any lymphadenopathy or petechiae.  He does not report any mood changes.  He does not report any heat or cold intolerance.  Remaining review of systems is negative.  Medications: I have reviewed the patient's current medications.  Current Outpatient Medications  Medication Sig Dispense Refill  . abiraterone acetate (ZYTIGA) 250 MG  tablet TAKE 4 TABLETS BY MOUTH EVERY DAY (DO NOT EAT STARTING 2 HOURS BEFORE AND FOR 1 HOUR AFTER TAKING) 120 tablet 11  . predniSONE (DELTASONE) 5 MG tablet Take 1 tablet (5 mg total) by mouth daily with breakfast. 30 tablet 3  . tamsulosin (FLOMAX) 0.4 MG CAPS capsule Take 1 capsule (0.4 mg total) by mouth daily after supper. 30 capsule 0   No current facility-administered medications for this visit.      Allergies: No Known Allergies  Past Medical History, Surgical history, Social history, and Family History reviewed again and unchanged.    Physical Exam:  Blood pressure (!) 170/89, pulse 81, temperature 98.7 F (37.1 C), temperature source Oral, resp. rate 18, height 5\' 7"  (1.702 m), weight 256 lb 4.8 oz (116.3 kg), SpO2 99 %.  ECOG: 1   General appearance: Comfortable appearing without any discomfort Head: Normocephalic without any trauma Oropharynx: Mucous membranes are moist and pink without any thrush or ulcers. Eyes: Pupils are equal and round reactive to light. Lymph nodes: No cervical, supraclavicular, inguinal or axillary lymphadenopathy.   Heart:regular rate and rhythm.  S1 and S2 without leg edema. Lung: Clear without any rhonchi or wheezes.  No dullness to percussion. Abdomin: Soft, nontender, nondistended with good bowel sounds.  No hepatosplenomegaly. Musculoskeletal: No joint deformity or effusion.  Full range of motion noted. Neurological: No deficits noted on motor, sensory and deep tendon reflex exam. Skin: No petechial rash or dryness.  Appeared moist.  Psychiatric: Mood and affect appeared appropriate.  Lab Results: Lab Results  Component Value Date   WBC 7.3 03/03/2018   HGB 14.0 03/03/2018   HCT 41.1 03/03/2018   MCV 81.7 03/03/2018   PLT 192 03/03/2018     Chemistry      Component Value Date/Time   NA 141 03/03/2018 0748   NA 142 09/10/2017 1509   K 3.8 03/03/2018 0748   K 4.2 09/10/2017 1509   CL 108 03/03/2018 0748   CO2 23 03/03/2018  0748   CO2 25 09/10/2017 1509   BUN 18 03/03/2018 0748   BUN 19.4 09/10/2017 1509   CREATININE 1.07 03/03/2018 0748   CREATININE 1.2 09/10/2017 1509      Component Value Date/Time   CALCIUM 9.5 03/03/2018 0748   CALCIUM 9.7 09/10/2017 1509   ALKPHOS 78 03/03/2018 0748   ALKPHOS 84 09/10/2017 1509   AST 14 (L) 03/03/2018 0748   AST 14 09/10/2017 1509   ALT 15 03/03/2018 0748   ALT 18 09/10/2017 1509   BILITOT 0.4 03/03/2018 0748   BILITOT 0.38 09/10/2017 1509     Results for Bobby [name] (MRN [id number]) as of 05/05/2018 08:09  Ref. Range 03/03/2018 07:48  Prostate Specific Ag, Serum Latest Ref Range: 0.0 - 4.0 ng/mL <0.1        Impression and Plan:  67 year old man with:  1.  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   He remains on Zytiga and prednisone without any major complications.  His PSA continues to be undetectable.  The natural courses of his disease was reviewed again options to treat him with reviewed today which including continue androgen deprivation therapy alone, continuing Zytiga in addition to androgen deprivation or switching to a different therapy.  After discussion today, he is agreeable to continue with the same dose and schedule and will repeat imaging studies for staging purposes in October 2019.  2.  Androgen deprivation therapy: He is currently on Lupron under the care of Dr. Jeffie Pollock.  I recommended continuing this indefinitely.  3.  Hypertension: His blood pressure remains elevated and I urged him to see his primary care physician which he is seeing hopefully in the future.  4.  Electrolyte and liver function test monitoring: Laboratory data from June 2019 showed normal electrolytes and liver function test and those will be repeated periodically.  5.  Weight gain: Related to androgen deprivation as well as lifestyle.  I recommended certain lifestyle modification as well as seeing his primary  care physician for cholesterol check.  6.  Prognosis: Treatment remains palliative at this time.  Aggressive therapy is warranted however.  7  Follow-up: Will be in 2 months.  15  minutes was spent with the patient face-to-face today.  More than 50% of time was dedicated to discussing the natural course of his disease, treatment options as well as coordinating his future plan of care.    Zola Button, MD 8/27/20198:24 AM

## 2018-05-05 NOTE — Progress Notes (Signed)
Bobby [name] states he is doing well except for the weight gain. We discussed this is side effect of hormone injections. We discussed diet and exercise. He states that he currently is not doing any type of exercise but not the weather is cooler will try to walk in the evenings.

## 2018-05-05 NOTE — Telephone Encounter (Signed)
Appointments scheduled AVS/Calendar printed / contrast material provided w/  Phone number for scheduling per 8/27 los

## 2018-05-06 ENCOUNTER — Telehealth: Payer: Self-pay | Admitting: *Deleted

## 2018-05-06 LAB — PROSTATE-SPECIFIC AG, SERUM (LABCORP): Prostate Specific Ag, Serum: <0.1 ng/mL (ref 0.0–4.0)

## 2018-05-06 NOTE — Telephone Encounter (Signed)
-----   Message from Wyatt Portela, MD sent at 05/06/2018  7:57 AM EDT ----- Please let him know his PSA is still low

## 2018-05-06 NOTE — Telephone Encounter (Signed)
Spoke with patient. Gave results of last PSA 

## 2018-06-05 ENCOUNTER — Other Ambulatory Visit: Payer: Self-pay | Admitting: *Deleted

## 2018-06-05 DIAGNOSIS — I10 Essential (primary) hypertension: Secondary | ICD-10-CM | POA: Diagnosis not present

## 2018-06-05 DIAGNOSIS — C61 Malignant neoplasm of prostate: Secondary | ICD-10-CM

## 2018-06-05 DIAGNOSIS — Z833 Family history of diabetes mellitus: Secondary | ICD-10-CM | POA: Diagnosis not present

## 2018-06-05 DIAGNOSIS — Z1389 Encounter for screening for other disorder: Secondary | ICD-10-CM | POA: Diagnosis not present

## 2018-06-05 DIAGNOSIS — N200 Calculus of kidney: Secondary | ICD-10-CM | POA: Diagnosis not present

## 2018-06-05 DIAGNOSIS — K5730 Diverticulosis of large intestine without perforation or abscess without bleeding: Secondary | ICD-10-CM | POA: Diagnosis not present

## 2018-06-05 DIAGNOSIS — R7309 Other abnormal glucose: Secondary | ICD-10-CM | POA: Diagnosis not present

## 2018-06-05 DIAGNOSIS — E668 Other obesity: Secondary | ICD-10-CM | POA: Diagnosis not present

## 2018-06-05 DIAGNOSIS — Z23 Encounter for immunization: Secondary | ICD-10-CM | POA: Diagnosis not present

## 2018-06-05 DIAGNOSIS — Z6839 Body mass index (BMI) 39.0-39.9, adult: Secondary | ICD-10-CM | POA: Diagnosis not present

## 2018-06-05 MED ORDER — PREDNISONE 5 MG PO TABS: 5.0000 mg | ORAL_TABLET | Freq: Every day | ORAL | 2 refills | Status: DC

## 2018-07-06 ENCOUNTER — Encounter: Payer: Self-pay | Admitting: Medical Oncology

## 2018-07-06 ENCOUNTER — Telehealth: Payer: Self-pay

## 2018-07-06 DIAGNOSIS — Z6839 Body mass index (BMI) 39.0-39.9, adult: Secondary | ICD-10-CM | POA: Diagnosis not present

## 2018-07-06 DIAGNOSIS — H1032 Unspecified acute conjunctivitis, left eye: Secondary | ICD-10-CM | POA: Diagnosis not present

## 2018-07-06 NOTE — Telephone Encounter (Signed)
Oral Oncology Patient Advocate Encounter  I received a message from Dr. Hazeline Junker nurse that the patient does not have Part D at the moment. He has enough Zytiga to get him to 11/2. [employer] will be his new Part D and will take effect 11/1.  I called the patient and went over this with him. On 11/1 I will get his [employer] information through our [facility] system and get the Prior Authorization. Once the Prior Authorization is approved I will let him know the copay. I will be keeping an eye out for Prostate grant funding and if there is none available by the time we need it we will apply for [name] and [employer] assistance.   I explained to him that he will likely be without medicine for a week or so if we have to get [employer]. He was concerned about going without medicine so I assured him I would send the doctor this note and he stated he would also talk to him when he comes in for his appointment Thursday.  Bobby [name] verbalized understanding and appreciation of this.  [facility] [place] Phone [phone] Fax [phone]

## 2018-07-07 ENCOUNTER — Ambulatory Visit (HOSPITAL_COMMUNITY)
Admission: RE | Admit: 2018-07-07 | Discharge: 2018-07-07 | Disposition: A | Discharge status: Home / Self Care | Payer: Medicare Other | Source: Ambulatory Visit | Attending: Oncology | Admitting: Oncology

## 2018-07-07 ENCOUNTER — Inpatient Hospital Stay: Payer: Medicare Other | Attending: Oncology

## 2018-07-07 ENCOUNTER — Encounter (HOSPITAL_COMMUNITY): Payer: Self-pay

## 2018-07-07 DIAGNOSIS — I2510 Atherosclerotic heart disease of native coronary artery without angina pectoris: Secondary | ICD-10-CM | POA: Insufficient documentation

## 2018-07-07 DIAGNOSIS — R590 Localized enlarged lymph nodes: Secondary | ICD-10-CM | POA: Diagnosis not present

## 2018-07-07 DIAGNOSIS — K7689 Other specified diseases of liver: Secondary | ICD-10-CM | POA: Insufficient documentation

## 2018-07-07 DIAGNOSIS — C61 Malignant neoplasm of prostate: Secondary | ICD-10-CM | POA: Diagnosis not present

## 2018-07-07 DIAGNOSIS — E348 Other specified endocrine disorders: Secondary | ICD-10-CM | POA: Diagnosis not present

## 2018-07-07 DIAGNOSIS — C775 Secondary and unspecified malignant neoplasm of intrapelvic lymph nodes: Secondary | ICD-10-CM | POA: Insufficient documentation

## 2018-07-07 DIAGNOSIS — I10 Essential (primary) hypertension: Secondary | ICD-10-CM | POA: Insufficient documentation

## 2018-07-07 DIAGNOSIS — I700 Atherosclerosis of aorta: Secondary | ICD-10-CM | POA: Insufficient documentation

## 2018-07-07 DIAGNOSIS — Z79899 Other long term (current) drug therapy: Secondary | ICD-10-CM | POA: Diagnosis not present

## 2018-07-07 LAB — CBC WITH DIFFERENTIAL (CANCER CENTER ONLY)
ABS IMMATURE GRANULOCYTES: 0.03 10*3/uL (ref 0.00–0.07)
BASOS ABS: 0.1 10*3/uL (ref 0.0–0.1)
Basophils Relative: 1 %
EOS ABS: 0.3 10*3/uL (ref 0.0–0.5)
Eosinophils Relative: 4 %
HEMATOCRIT: 42.9 % (ref 39.0–52.0)
Hemoglobin: 13.7 g/dL (ref 13.0–17.0)
IMMATURE GRANULOCYTES: 0 %
LYMPHS ABS: 1.4 10*3/uL (ref 0.7–4.0)
Lymphocytes Relative: 19 %
MCH: 27.2 pg (ref 26.0–34.0)
MCHC: 31.9 g/dL (ref 30.0–36.0)
MCV: 85.1 fL (ref 80.0–100.0)
Monocytes Absolute: 0.6 10*3/uL (ref 0.1–1.0)
Monocytes Relative: 8 %
NEUTROS PCT: 68 %
NRBC: 0 % (ref 0.0–0.2)
Neutro Abs: 5.2 10*3/uL (ref 1.7–7.7)
PLATELETS: 213 10*3/uL (ref 150–400)
RBC: 5.04 MIL/uL (ref 4.22–5.81)
RDW: 14 % (ref 11.5–15.5)
WBC Count: 7.6 10*3/uL (ref 4.0–10.5)

## 2018-07-07 LAB — CMP (CANCER CENTER ONLY)
ALBUMIN: 3.6 g/dL (ref 3.5–5.0)
ALT: 21 U/L (ref 0–44)
AST: 16 U/L (ref 15–41)
Alkaline Phosphatase: 89 U/L (ref 38–126)
Anion gap: 11 (ref 5–15)
BUN: 14 mg/dL (ref 8–23)
CHLORIDE: 105 mmol/L (ref 98–111)
CO2: 30 mmol/L (ref 22–32)
Calcium: 9.7 mg/dL (ref 8.9–10.3)
Creatinine: 1.11 mg/dL (ref 0.61–1.24)
GFR, Est AFR Am: >60 mL/min (ref >60)
GFR, Estimated: >60 mL/min (ref >60)
GLUCOSE: 125 mg/dL — AB (ref 70–99)
POTASSIUM: 4 mmol/L (ref 3.5–5.1)
Sodium: 146 mmol/L — ABNORMAL HIGH (ref 135–145)
Total Bilirubin: 0.5 mg/dL (ref 0.3–1.2)
Total Protein: 6.7 g/dL (ref 6.5–8.1)

## 2018-07-07 MED ORDER — SODIUM CHLORIDE 0.9 % IJ SOLN
INTRAMUSCULAR | Status: AC
  Filled 2018-07-07: qty 50

## 2018-07-07 MED ORDER — IOHEXOL 300 MG/ML  SOLN
100.0000 mL | Freq: Once | INTRAMUSCULAR | Status: AC | PRN
  Administered 2018-07-07: 100 mL via INTRAVENOUS

## 2018-07-08 LAB — PROSTATE-SPECIFIC AG, SERUM (LABCORP)

## 2018-07-09 ENCOUNTER — Inpatient Hospital Stay (HOSPITAL_BASED_OUTPATIENT_CLINIC_OR_DEPARTMENT_OTHER): Payer: Medicare Other | Admitting: Oncology

## 2018-07-09 VITALS — BP 154/80 | HR 83 | Temp 98.3°F | Resp 18 | Ht 67.0 in | Wt 252.0 lb

## 2018-07-09 DIAGNOSIS — C775 Secondary and unspecified malignant neoplasm of intrapelvic lymph nodes: Secondary | ICD-10-CM | POA: Diagnosis not present

## 2018-07-09 DIAGNOSIS — Z79899 Other long term (current) drug therapy: Secondary | ICD-10-CM | POA: Diagnosis not present

## 2018-07-09 DIAGNOSIS — I10 Essential (primary) hypertension: Secondary | ICD-10-CM

## 2018-07-09 DIAGNOSIS — E348 Other specified endocrine disorders: Secondary | ICD-10-CM

## 2018-07-09 DIAGNOSIS — C61 Malignant neoplasm of prostate: Secondary | ICD-10-CM

## 2018-07-09 NOTE — Progress Notes (Signed)
Hematology and Oncology Follow Up Visit  Bobby [name] [id number] 1951-05-31 67 y.o. 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  Bobby [name] was found to have Gleason score 4+5 = 9, PSA of 14.7 and lymphadenopathy.   Prior Therapy: Bobby [name] is status post prostate biopsy in August 2018.  Current therapy:  Androgen deprivation therapy under the care of Dr. Jeffie Pollock started in September 2018.  Zytiga 1000 mg daily with prednisone 5 mg daily started in October 2018.  Interim History: Bobby [name] returns today for a repeat evaluation.  Since last visit, Bobby [name] reports no changes in his health.  Bobby [name] is fully retired from his job at this time and reports a reasonable quality of life.  Bobby [name] does report some mild fatigue and tiredness but overall denies no complications related to Zytiga.  Bobby [name] denies any lower extremity edema, bone pain or pathological fractures.  Bobby [name] remains reasonably active with good appetite.  Bobby [name] does not report any headaches, blurry vision, syncope or seizures.  Bobby [name] denies any alteration in mental status or confusion.  Bobby [name] does not report any fevers, chills, sweats.  Bobby [name] does not report any chest pain, palpitation, orthopnea.. Bobby [name] does not report any cough, wheezing or hemoptysis.  Bobby [name] does not report any nausea, vomiting or abdominal pain. Bobby [name] does not report any constipation or diarrhea.  Bobby [name] does not report any hematuria or dysuria.  Bobby [name] denies any frequency or nocturia.  Bobby [name] does not report any bleeding or clotting tendency.  Bobby [name] denies any mood changes.    Remaining review of systems is negative.  Medications: I have reviewed the patient's current medications.  Current Outpatient Medications  Medication Sig Dispense Refill  . abiraterone acetate (ZYTIGA) 250 MG tablet TAKE 4 TABLETS BY MOUTH EVERY DAY (DO NOT EAT STARTING 2 HOURS BEFORE AND FOR 1 HOUR AFTER TAKING) 120 tablet 11  .  predniSONE (DELTASONE) 5 MG tablet Take 1 tablet (5 mg total) by mouth daily with breakfast. 30 tablet 2  . tamsulosin (FLOMAX) 0.4 MG CAPS capsule Take 1 capsule (0.4 mg total) by mouth daily after supper. 30 capsule 0   No current facility-administered medications for this visit.      Allergies: No Known Allergies  Past Medical History, Surgical history, Social history, and Family History reviewed again and unchanged.    Physical Exam:  Blood pressure (!) 154/80, pulse 83, temperature 98.3 F (36.8 C), temperature source Oral, resp. rate 18, height 5\' 7"  (1.702 m), weight 252 lb (114.3 kg), SpO2 97 %.  ECOG: 1   General appearance: Alert, awake without any distress. Head: Atraumatic without abnormalities Oropharynx: Without any thrush or ulcers. Eyes: No scleral icterus. Lymph nodes: No lymphadenopathy noted in the cervical, supraclavicular, or axillary nodes Heart:regular rate and rhythm, without any murmurs or gallops.   Lung: Clear to auscultation without any rhonchi, wheezes or dullness to percussion. Abdomin: Soft, nontender without any shifting dullness or ascites. Musculoskeletal: No clubbing or cyanosis. Neurological: No motor or sensory deficits. Skin: No rashes or lesions.     Lab Results: Lab Results  Component Value Date   WBC 7.6 07/07/2018   HGB 13.7 07/07/2018   HCT 42.9 07/07/2018   MCV 85.1 07/07/2018   PLT 213 07/07/2018     Chemistry      Component Value Date/Time   NA 146 (H) 07/07/2018 0759   NA 142 09/10/2017 1509   K 4.0 07/07/2018 0759  K 4.2 09/10/2017 1509   CL 105 07/07/2018 0759   CO2 30 07/07/2018 0759   CO2 25 09/10/2017 1509   BUN 14 07/07/2018 0759   BUN 19.4 09/10/2017 1509   CREATININE 1.11 07/07/2018 0759   CREATININE 1.2 09/10/2017 1509      Component Value Date/Time   CALCIUM 9.7 07/07/2018 0759   CALCIUM 9.7 09/10/2017 1509   ALKPHOS 89 07/07/2018 0759   ALKPHOS 84 09/10/2017 1509   AST 16 07/07/2018 0759   AST  14 09/10/2017 1509   ALT 21 07/07/2018 0759   ALT 18 09/10/2017 1509   BILITOT 0.5 07/07/2018 0759   BILITOT 0.38 09/10/2017 1509     CLINICAL DATA:  Prostate cancer diagnosed in 2018. On hormone therapy.  EXAM: CT ABDOMEN AND PELVIS WITH CONTRAST  TECHNIQUE: Multidetector CT imaging of the abdomen and pelvis was performed using the standard protocol following bolus administration of intravenous contrast.  CONTRAST:  156mL OMNIPAQUE IOHEXOL 300 MG/ML  SOLN  COMPARISON:  CT 05/21/2017.  Whole-body bone scan 05/21/2017.  FINDINGS: Lower chest: Clear lung bases. There is no significant pleural or pericardial effusion. Coronary artery atherosclerosis noted.  Hepatobiliary: Scattered low-density hepatic lesions are stable, likely all cysts. No new or enlarging hepatic lesions are identified. No evidence of gallstones, gallbladder wall thickening or biliary dilatation.  Pancreas: Unremarkable. No pancreatic ductal dilatation or surrounding inflammatory changes.  Spleen: Normal in size without focal abnormality.  Adrenals/Urinary Tract: Both adrenal glands appear normal. There is a stable small cyst in the lower pole of the left kidney. The right kidney appears normal. There is no definite urinary tract calculus or hydronephrosis. Mild bladder wall thickening is stable.  Stomach/Bowel: The stomach, small bowel, appendix and proximal colon appear normal without wall thickening or surrounding inflammation. Fairly extensive diverticular changes are again noted throughout the descending and sigmoid colon. There is a persistent long segment of wall thickening and luminal narrowing in the sigmoid colon, extending over 5.1 cm on coronal image 48/4. Associated intramural low-density on the prior study has resolved. There is no significant residual pericolonic inflammatory change. No evidence of bowel obstruction or perforation.  Vascular/Lymphatic: There are no  significant residual enlarged abdominal or pelvic lymph nodes. Specifically, the prominent periaortic and left common iliac nodes on the prior examination have resolved. Small lymph nodes in the porta hepatis are stable. There is aortic and branch vessel atherosclerosis. No acute vascular findings.  Reproductive: The prostate gland and seminal vesicles appear unremarkable.  Other: No evidence of abdominal wall mass or hernia. No ascites. Probable sequela of subcutaneous injections in the anterior abdominal wall subcutaneous fat.  Musculoskeletal: No acute osseous findings or evidence of metastatic disease. There are bilateral L5 pars defects with a grade 1 anterolisthesis and moderate biforaminal narrowing at L5-S1.  IMPRESSION: 1. Stable appearance of the prostate gland. No evidence of metastatic disease. 2. Previously demonstrated findings of probable sigmoid diverticulitis have improved with resolution of pericolonic inflammation and probable small intramural abscess. However, there is persistent long segment wall thickening and luminal narrowing in the sigmoid colon. Underlying neoplasm cannot be excluded. GI consultation recommended for possible colonoscopy, especially if screening is not up-to-date. 3. Improvement in previously demonstrated retroperitoneal lymphadenopathy, likely reactive. 4. Stable hepatic cysts. 5. Coronary and Aortic Atherosclerosis (ICD10-I70.0).    Results for BEARETT, PORCARO (MRN [id number]) as of 07/09/2018 08:16  Ref. Range 05/05/2018 08:18 07/07/2018 07:59  Prostate Specific Ag, Serum Latest Ref Range: 0.0 - 4.0 ng/mL <0.1 <0.1  Impression and Plan:  67 year old man with:  1.  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   Bobby [name] is currently on Zytiga and has tolerated it very well.  Bobby [name] does report some mild fatigue that is manageable at this time.  CT scan  was obtained on 07/07/2018 was personally reviewed and showed complete response to therapy at this time.  Risks and benefits of continuing this medication was reviewed.  Dose reduction was also suggested but for the time being Bobby [name] opted to continue with the same dose and schedule.  Dose reductions could be considered in the future if Bobby [name] develops any worsening side effects.  2.  Androgen deprivation therapy: I recommended continuing Lupron indefinitely.  Bobby [name] is currently receiving under the care of Dr. Jeffie Pollock.  3.  Hypertension: Mildly elevated today and Bobby [name] is following with his primary care physician regarding this issue.  4.  Electrolyte and liver function test monitoring: No abnormalities detected and these will be continued periodically.  5.  Weight gain: We will continue to address this issue with him including diet modification and increase exercise..  6.  Prognosis: Therapy remains palliative although Bobby [name] has excellent disease response and performance status.  Aggressive therapy is warranted.  7  Follow-up: Will be in 2 months.  25  minutes was spent with the patient face-to-face today.  More than 50% of time was dedicated to reviewing imaging studies, laboratory data and discussing future treatment options.    Zola Button, MD 10/31/20198:53 AM

## 2018-07-10 ENCOUNTER — Telehealth: Payer: Self-pay | Admitting: Pharmacist

## 2018-07-10 ENCOUNTER — Encounter: Payer: Self-pay | Admitting: Medical Oncology

## 2018-07-10 DIAGNOSIS — C61 Malignant neoplasm of prostate: Secondary | ICD-10-CM

## 2018-07-10 MED ORDER — PREDNISONE 5 MG PO TABS: 5.0000 mg | ORAL_TABLET | Freq: Every day | ORAL | 2 refills | Status: DC

## 2018-07-10 MED ORDER — ABIRATERONE ACETATE 250 MG PO TABS: 1000.0000 mg | ORAL_TABLET | Freq: Every day | ORAL | 11 refills | Status: DC

## 2018-07-10 MED FILL — predniSONE 5 MG TABS: 5 | 30 days supply | Qty: 30 | Fill #0

## 2018-07-10 MED FILL — ABIRATERONE ACETATE 250 MG: 250 | 30 days supply | Qty: 120 | Fill #0

## 2018-07-10 NOTE — Telephone Encounter (Signed)
Oral Chemotherapy Pharmacist Encounter   Spoke with patient today to follow up regarding patient's oral chemotherapy medication: Zytiga (abiraterone) for the treatment of castration sensitive, metastatic prostate cancer in conjunction with prednisone and androgen deprivation therapy, planned duration until disease progression or unacceptable toxicity  Original Start date of oral chemotherapy: 07/16/2017  Pt is doing well today  Pt reports 0 tablets/doses of Zytiga 250 mg tablets, 4 tablets (1000 mg) taken by mouth once daily on an empty stomach, 1 hour before or 2 hours after meals, missed in the last month.  Patient states he takes his Zytiga daily between 330 and 5 in the afternoon and uses an alarm on his phone to remember to take his Zytiga.  Pt reports the following side effects: Patient continues with mild to moderate fatigue, it is managed by just sitting down a little bit more often and sometimes he takes naps. Patient also states that his blood pressure has increased over time since being on the Zytiga, and this is being managed appropriately through Dr. Alen Blew and his PCP.  Pertinent labs reviewed: Okay for continued treatment.  Other Issues: Patient had lost his prescription insurance coverage in the middle of September.  Medicare part D prescription coverage started today (07/10/2018).  This has necessitated a pharmacy change to [facility] long outpatient pharmacy. Oral oncology patient advocate ran a test claim on the [facility] and showed that it did not require prior authorization through [facility] at this time. Copayment due at the pharmacy is approximately $1100, patient will pay at this time. He will be working with the oral oncology patient advocate to obtain a copayment grant (none are available today) or to complete manufacturer assistance application in order to reduce his out-of-pocket cost for Zytiga.  Patient states his last dose of Zytiga that he has on hand will be taken  on Monday, 07/13/2018. Patient will pick up his new fill of Zytiga from the [facility] today, after 2 PM for very high copayment.  Patient knows to call the office with questions or concerns. Oral Oncology Clinic will continue to follow.  Johny Drilling, PharmD, BCPS, BCOP  07/10/2018 11:14 AM Oral Oncology Clinic [phone]

## 2018-07-10 NOTE — Telephone Encounter (Signed)
Oral Oncology Patient Advocate Encounter  Confirmed with [place] that Bobby [name] was picked up on 07/10/18 with a $1102 copay.   [place] Patient [place] Phone [phone] Fax [phone]

## 2018-07-10 NOTE — Telephone Encounter (Signed)
Oral Oncology Patient Advocate Encounter  I called [facility] where he had been getting his Zytiga filled through his last insurance and they stated his last fill was 05-19-18 and it was being filled with [facility] now. I called [facility] and they have not filled a script for him yet because they were waiting on a benefit information from him. I called the patient and he stated he never called [facility] back because he did not have insurance.  Patient now has Part D insurance through [place] as of 07/10/18. I did a test claim at [facility] and it revealed a copayment of $1102. I called the patient and informed him of this. I told him we could do a Risk analyst and I would keep an eye on a grant in the meantime. He declined doing a Risk analyst at this time and wants to pay the copay at the pharmacy. I informed him that [name] and [name] could take a couple weeks to process, if he changed his mind to let me know and we could get that sent off.   He is set up to pick the medicine up from [place] today after 2:30.  He verbalized understanding and appreciation.  [place] Patient [place] Phone [phone] Fax [phone]

## 2018-07-15 ENCOUNTER — Telehealth: Payer: Self-pay

## 2018-07-15 NOTE — Telephone Encounter (Signed)
Oral Oncology Patient Advocate Encounter  I was successful at securing a grant with Patient [facility]) for $7500. This will keep the out of pocket expense for Zytiga at $0. The grant information is as follows and has been shared with [place].  Approval dates: 04/14/18-07/13/19 ID: [id number] Group: [id number] BIN: [id number] PCN: [id number]  I spoke to the patient and gave him this information as well as letting him know that the grant will be applied to his next fill at [facility]. He verbalized understanding and great appreciation.  [place] Patient [place] Phone [phone] Fax [phone]

## 2018-07-16 ENCOUNTER — Other Ambulatory Visit: Payer: Self-pay | Admitting: Medical Oncology

## 2018-07-16 ENCOUNTER — Telehealth: Payer: Self-pay | Admitting: Oncology

## 2018-07-16 DIAGNOSIS — C61 Malignant neoplasm of prostate: Secondary | ICD-10-CM

## 2018-07-16 NOTE — Progress Notes (Signed)
Bobby [name] called stating the he no longer has the same insurance. He has signed up for medicare but will have a new policy for drug coverage. He is worried about getting his Zytiga refilled. I informed him that I will reach out to Jesse-oral chemo pharmacist and her team to assist him. He voiced understanding. He voiced understanding. I forwarded this message to [place].

## 2018-07-16 NOTE — Telephone Encounter (Signed)
Scheduled appt per 1/17 sch message - pt is aware of appt date and time   

## 2018-07-16 NOTE — Progress Notes (Signed)
[name] states he is doing well and he did receive a call from our pharmacy regarding his Bobby [name] and new insurance. He informed me his brother was recently diagnosed with pancreatic cancer raising concerns. His sister is a breast cancer survivor, he has prostate and now his brother with pancreatic. He asked about doing genetics testing. I will ask Dr. Alen Blew for orders and informed him that genetics will call him to schedule. He voiced understanding.

## 2018-07-20 DIAGNOSIS — R351 Nocturia: Secondary | ICD-10-CM | POA: Diagnosis not present

## 2018-07-20 DIAGNOSIS — C61 Malignant neoplasm of prostate: Secondary | ICD-10-CM | POA: Diagnosis not present

## 2018-07-27 ENCOUNTER — Encounter: Payer: Self-pay | Admitting: Genetics

## 2018-08-03 ENCOUNTER — Inpatient Hospital Stay: Payer: Medicare HMO | Attending: Oncology | Admitting: Licensed Clinical Social Worker

## 2018-08-03 DIAGNOSIS — Z803 Family history of malignant neoplasm of breast: Secondary | ICD-10-CM

## 2018-08-03 DIAGNOSIS — C61 Malignant neoplasm of prostate: Secondary | ICD-10-CM

## 2018-08-03 DIAGNOSIS — Z1379 Encounter for other screening for genetic and chromosomal anomalies: Secondary | ICD-10-CM

## 2018-08-03 DIAGNOSIS — Z800 Family history of malignant neoplasm of digestive organs: Secondary | ICD-10-CM | POA: Diagnosis not present

## 2018-08-03 DIAGNOSIS — Z8051 Family history of malignant neoplasm of kidney: Secondary | ICD-10-CM

## 2018-08-03 MED FILL — ABIRATERONE ACETATE 250 MG: 250 | 30 days supply | Qty: 120 | Fill #1

## 2018-08-03 MED FILL — predniSONE 5 MG TABS: 5 | 30 days supply | Qty: 30 | Fill #1

## 2018-08-04 ENCOUNTER — Encounter: Payer: Self-pay | Admitting: Licensed Clinical Social Worker

## 2018-08-04 DIAGNOSIS — Z800 Family history of malignant neoplasm of digestive organs: Secondary | ICD-10-CM | POA: Insufficient documentation

## 2018-08-04 DIAGNOSIS — Z8051 Family history of malignant neoplasm of kidney: Secondary | ICD-10-CM | POA: Insufficient documentation

## 2018-08-04 DIAGNOSIS — Z803 Family history of malignant neoplasm of breast: Secondary | ICD-10-CM | POA: Insufficient documentation

## 2018-08-04 NOTE — Progress Notes (Signed)
REFERRING PROVIDER: Wyatt Portela, [name], [place] [address]  PRIMARY PROVIDER:  [name], [name]  PRIMARY REASON FOR VISIT:  1. Malignant neoplasm of prostate ([place])   2. Family history of pancreatic cancer   3. Family history of breast cancer   4. Family history of kidney cancer      HISTORY OF PRESENT ILLNESS:   Bobby [name], a 67 y.o. male, was seen for a [place] cancer genetics consultation at the request of Dr. Alen [name] due to a personal and family history of cancer.  Bobby [name] presents to clinic today to discuss the possibility of a hereditary predisposition to cancer, genetic testing, and to further clarify his future cancer risks, as well as potential cancer risks for family members.   In 2018, at the age of 39, Bobby [name] was diagnosed with prostate cancer, Gleason score 4+5=9. This is being treated with androgen deprivation therapy.  Bobby [name] reports that he had one polyp on his last colonoscopy which was in 2018, but all other colonoscopies have been normal.   Past Medical History:  Diagnosis Date  . Family history of breast cancer   . Family history of kidney cancer   . Family history of pancreatic cancer   . History of kidney stones   . Prostate cancer [facility])     Past Surgical History:  Procedure Laterality Date  . EXTRACORPOREAL SHOCK WAVE LITHOTRIPSY Right 04/17/2017   Procedure: RIGHT EXTRACORPOREAL SHOCK WAVE LITHOTRIPSY (ESWL);  Surgeon: Bobby [name];  Location: [facility];  Service: Urology;  Laterality: Right;  . HERNIA REPAIR     age 59  . PROSTATE BIOPSY      Social History   Socioeconomic History  . Marital status: Married    Spouse name: Not on file  . Number of children: Not on file  . Years of education: Not on file  . Highest education level: Not on file  Occupational History  . Not on file  Social Needs  . Financial resource strain: Not on file  . Food insecurity:    Worry: Not on file    Inability: Not on file  .  Transportation needs:    Medical: Not on file    Non-medical: Not on file  Tobacco Use  . Smoking status: Current Some Day Smoker    Packs/day: 0.25    Years: 10.00    Pack years: 2.50    Types: Cigarettes  . Smokeless tobacco: Never Used  . Tobacco comment: pt states is currently trying to stop   Substance and Sexual Activity  . Alcohol use: No  . Drug use: No  . Sexual activity: Yes  Lifestyle  . Physical activity:    Days per week: Not on file    Minutes per session: Not on file  . Stress: Not on file  Relationships  . Social connections:    Talks on phone: Not on file    Gets together: Not on file    Attends religious service: Not on file    Active member of club or organization: Not on file    Attends meetings of clubs or organizations: Not on file    Relationship status: Not on file  Other Topics Concern  . Not on file  Social History Narrative  . Not on file     FAMILY HISTORY:  We obtained a detailed, 4-generation family history.  Significant diagnoses are listed below: Family History  Problem Relation Age of Onset  .  Breast cancer Sister 48       "negative genetic testing"  . Pancreatic cancer Brother 63  . Breast cancer Maternal Aunt   . Kidney cancer Maternal Aunt        dx 63s    Bobby [name] has a son and a daughter, no cancer history. He has a sister who was diagnosed with breast cancer at 29 and had negative genetic testing, but this report was not available for review in the session. Bobby [name] also had a brother who was diagnosed with pancreatic cancer at 56 and died at 24.   Bobby [name] mother died at 82. She had a sister who had "multiple cancers." The patient knows she had at least kidney cancer in her 49's as well as breast cancer. No cancer in the patient's maternal cousins. Both of his maternal grandparents died in their 31's.  Bobby [name] father died at 53 from diabetes complications. He had 2 brothers and 3 sisters, no cancers. No cancer history for  paternal cousins. Both his paternal grandparents also died in their 61's.  Bobby [name] is aware of previous family history of genetic testing for hereditary cancer risks. Patient's maternal ancestors are of Caucasian descent, and paternal ancestors are of Caucasian descent. There is no reported Ashkenazi Jewish ancestry. There is no known consanguinity.  GENETIC COUNSELING ASSESSMENT: Bobby [name]. is a 67 y.o. male with a personal and family history which is somewhat suggestive of a Hereditary Cancer Predisposition Syndrome. We, therefore, discussed and recommended the following at today's visit.   DISCUSSION: We discussed that about 5-10% of cancer cases are hereditary. We reviewed the BRCA1/2 genes given his family history of breast and pancreatic as well as his own prostate cancer. We also noted there are many other genes associated with these cancers as well. We reviewed the characteristics, features and inheritance patterns of hereditary cancer syndromes. We also discussed genetic testing, including the appropriate family members to test, the process of testing, insurance coverage and turn-around-time for results. We discussed the implications of a negative, positive and/or variant of uncertain significant result. We recommended Bobby [name] pursue genetic testing for the [facility] Multi-Cancer Panel.  The Multi-Cancer Panel offered by [facility] includes sequencing and/or deletion duplication testing of the following 84 genes: AIP, ALK, APC, ATM, AXIN2,BAP1,  BARD1, BLM, BMPR1A, BRCA1, BRCA2, BRIP1, CASR, CDC73, CDH1, CDK4, CDKN1B, CDKN1C, CDKN2A (p14ARF), CDKN2A (p16INK4a), CEBPA, CHEK2, CTNNA1, DICER1, DIS3L2, EGFR (c.2369C>T, p.Thr790Met variant only), EPCAM (Deletion/duplication testing only), FH, FLCN, GATA2, GPC3, GREM1 (Promoter region deletion/duplication testing only), HOXB13 (c.251G>A, p.Gly84Glu), HRAS, KIT, MAX, MEN1, MET, MITF (c.952G>A, p.Glu318Lys variant only), MLH1, MSH2, MSH3, MSH6, MUTYH,  NBN, NF1, NF2, NTHL1, PALB2, PDGFRA, PHOX2B, PMS2, POLD1, POLE, POT1, PRKAR1A, PTCH1, PTEN, RAD50, RAD51C, RAD51D, RB1, RECQL4, RET, RUNX1, SDHAF2, SDHA (sequence changes only), SDHB, SDHC, SDHD, SMAD4, SMARCA4, SMARCB1, SMARCE1, STK11, SUFU, TERC, TERT, TMEM127, TP53, TSC1, TSC2, VHL, WRN and WT1.   We discussed that if he is found to have a mutation in one of these genes, it may impact future medical management recommendations such as increased cancer screenings and consideration of risk reducing surgeries.  A positive result could also have implications for the patient's family members.  A Negative result would mean we were unable to identify a hereditary component to his personal and family history of cancer but does not rule out the possibility of a hereditary basis for his personal and family history of cancer.  There could be mutations that are undetectable by current  technology, or in genes not yet tested or identified to increase cancer risk.    We discussed the potential to find a Variant of Uncertain Significance or VUS.  These are variants that have not yet been identified as pathogenic or benign, and it is unknown if this variant is associated with increased cancer risk or if this is a normal finding.  Most VUS's are reclassified to benign or likely benign.   It should not be used to make medical management decisions. With time, we suspect the lab will determine the significance of any VUS's identified if any.   Based on Bobby [name] personal and family history of cancer, he meets NCCN medical criteria for genetic testing. Despite that he meets criteria, he may still have an out of pocket cost. The lab will notify him of his out of pocket cost, if any.   PLAN: After considering the risks, benefits, and limitations, Bobby [name]  provided informed consent to pursue genetic testing and the saliva sample was sent to [facility] for analysis of the Multi-Cancer Panel. Results should be  available within approximately 2-3 weeks' time, at which point they will be disclosed by telephone to Bobby [name], as will any additional recommendations warranted by these results. Bobby [name] will receive a summary of his genetic counseling visit and a copy of his results once available. This information will also be available in Epic.  Based on Bobby [name] family history, we recommended his brother's children have genetic counseling and testing. Bobby [name] will let us know if we can be of any assistance in coordinating genetic counseling and/or testing for these family members.  Lastly, we encouraged Bobby [name]. [place] to remain in contact with cancer genetics annually so that we can continuously update the family history and inform him of any changes in cancer genetics and testing that may be of benefit for this family.   Bobby [name].  [place] questions were answered to his satisfaction today. Our contact information was provided should additional questions or concerns arise. Thank you for the referral and allowing [place] to share in the care of your patient.   Faith Rogue, MS Genetic Counselor [email or web address] Phone: [phone]  The patient was seen for a total of 45 minutes in face-to-face genetic counseling.

## 2018-08-06 DIAGNOSIS — Z8051 Family history of malignant neoplasm of kidney: Secondary | ICD-10-CM | POA: Diagnosis not present

## 2018-08-06 DIAGNOSIS — Z803 Family history of malignant neoplasm of breast: Secondary | ICD-10-CM | POA: Diagnosis not present

## 2018-08-06 DIAGNOSIS — Z800 Family history of malignant neoplasm of digestive organs: Secondary | ICD-10-CM | POA: Diagnosis not present

## 2018-08-06 DIAGNOSIS — C61 Malignant neoplasm of prostate: Secondary | ICD-10-CM | POA: Diagnosis not present

## 2018-08-20 ENCOUNTER — Telehealth: Payer: Self-pay | Admitting: Licensed Clinical Social Worker

## 2018-08-20 NOTE — Telephone Encounter (Signed)
Spoke with Bobby [name] about his testing. The lab contacted me and informed me they are unable to do the CNV analysis portion of the testing and were wondering if he would like to proceed with just sequencing portion for now or send in a whole new sample. [name] said he would prefer to do a whole new sample and this will be collected the next time he has an appointment, 12/26.

## 2018-08-28 MED FILL — predniSONE 5 MG TABS: 5 | 30 days supply | Qty: 30 | Fill #2

## 2018-08-28 MED FILL — ABIRATERONE ACETATE 250 MG: 250 | 30 days supply | Qty: 120 | Fill #2

## 2018-09-03 ENCOUNTER — Inpatient Hospital Stay: Payer: Medicare HMO

## 2018-09-03 ENCOUNTER — Inpatient Hospital Stay: Payer: Medicare HMO | Attending: Oncology | Admitting: Oncology

## 2018-09-03 VITALS — BP 174/84 | HR 84 | Temp 98.3°F | Resp 18 | Ht 67.0 in | Wt 247.4 lb

## 2018-09-03 DIAGNOSIS — I10 Essential (primary) hypertension: Secondary | ICD-10-CM | POA: Diagnosis not present

## 2018-09-03 DIAGNOSIS — C61 Malignant neoplasm of prostate: Secondary | ICD-10-CM

## 2018-09-03 DIAGNOSIS — Z79899 Other long term (current) drug therapy: Secondary | ICD-10-CM | POA: Insufficient documentation

## 2018-09-03 LAB — CBC WITH DIFFERENTIAL (CANCER CENTER ONLY)
Abs Immature Granulocytes: 0.02 10*3/uL (ref 0.00–0.07)
BASOS ABS: 0 10*3/uL (ref 0.0–0.1)
Basophils Relative: 0 %
EOS PCT: 2 %
Eosinophils Absolute: 0.2 10*3/uL (ref 0.0–0.5)
HEMATOCRIT: 40.6 % (ref 39.0–52.0)
HEMOGLOBIN: 13.5 g/dL (ref 13.0–17.0)
Immature Granulocytes: 0 %
LYMPHS ABS: 1.1 10*3/uL (ref 0.7–4.0)
Lymphocytes Relative: 14 %
MCH: 27.4 pg (ref 26.0–34.0)
MCHC: 33.3 g/dL (ref 30.0–36.0)
MCV: 82.4 fL (ref 80.0–100.0)
MONO ABS: 0.6 10*3/uL (ref 0.1–1.0)
MONOS PCT: 7 %
Neutro Abs: 6 10*3/uL (ref 1.7–7.7)
Neutrophils Relative %: 77 %
Platelet Count: 222 10*3/uL (ref 150–400)
RBC: 4.93 MIL/uL (ref 4.22–5.81)
RDW: 13.5 % (ref 11.5–15.5)
WBC Count: 7.9 10*3/uL (ref 4.0–10.5)
nRBC: 0 % (ref 0.0–0.2)

## 2018-09-03 LAB — CMP (CANCER CENTER ONLY)
ALK PHOS: 87 U/L (ref 38–126)
ALT: 23 U/L (ref 0–44)
AST: 20 U/L (ref 15–41)
Albumin: 3.5 g/dL (ref 3.5–5.0)
Anion gap: 9 (ref 5–15)
BUN: 14 mg/dL (ref 8–23)
CHLORIDE: 107 mmol/L (ref 98–111)
CO2: 28 mmol/L (ref 22–32)
CREATININE: 1.01 mg/dL (ref 0.61–1.24)
Calcium: 9.3 mg/dL (ref 8.9–10.3)
GFR, Est AFR Am: >60 mL/min (ref >60)
GFR, Estimated: >60 mL/min (ref >60)
GLUCOSE: 112 mg/dL — AB (ref 70–99)
Potassium: 3.3 mmol/L — ABNORMAL LOW (ref 3.5–5.1)
SODIUM: 144 mmol/L (ref 135–145)
Total Bilirubin: 0.4 mg/dL (ref 0.3–1.2)
Total Protein: 6.5 g/dL (ref 6.5–8.1)

## 2018-09-03 NOTE — Progress Notes (Signed)
Hematology and Oncology Follow Up Visit  Bobby [name] [id number] 20-Jun-1951 67 y.o. 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  At presentation his Gleason score 4+5 = 9 and PSA of 14.7   Prior Therapy: He is status post prostate biopsy in August 2018.  Current therapy:  Androgen deprivation therapy under the care of Dr. Jeffie Pollock started in September 2018.  Zytiga 1000 mg daily with prednisone 5 mg daily started in October 2018.  Interim History: Bobby [name] is here for a follow-up visit.  Since the last visit, he reports no major changes in his health.  He was started on amlodipine 5 mg daily with improvement in his blood pressure.  He does report some mild fatigue and tiredness associated with Zytiga but no other complaints.  He denies any bone pain or pathological fractures.  His appetite and performance status is unchanged.  He does not report any headaches, blurry vision, syncope or seizures.  He denies any confusion or lethargy.  He does not report any fevers, chills, sweats.  He does not report any chest pain, palpitation, orthopnea. He does not report any cough, wheezing or hemoptysis.  He does not report any nausea, vomiting or early satiety.  He does not report any changes in bowel habits.  He does not report any hematuria or dysuria.  He denies any frequency or nocturia.  He does not report any ecchymosis or petechiae.  He denies any anxiety or depression.    Remaining review of systems is negative.  Medications: I have reviewed the patient's current medications.  Current Outpatient Medications  Medication Sig Dispense Refill  . abiraterone acetate (ZYTIGA) 250 MG tablet Take 4 tablets (1,000 mg total) by mouth daily. Take on an empty stomach, 1 hour before or 2 hours after a meal. 120 tablet 11  . predniSONE  (DELTASONE) 5 MG tablet Take 1 tablet (5 mg total) by mouth daily with breakfast. 30 tablet 2  . tamsulosin (FLOMAX) 0.4 MG CAPS capsule Take 1 capsule (0.4 mg total) by mouth daily after supper. 30 capsule 0   No current facility-administered medications for this visit.      Allergies: No Known Allergies  Past Medical History, Surgical history, Social history, and Family History reviewed again and unchanged.    Physical Exam: Blood pressure (!) 174/84, pulse 84, temperature 98.3 F (36.8 C), temperature source Oral, resp. rate 18, height 5\' 7"  (1.702 m), weight 247 lb 6.4 oz (112.2 kg), SpO2 100 %.   ECOG: 1   General appearance: Comfortable appearing without any discomfort Head: Normocephalic without any trauma Oropharynx: Mucous membranes are moist and pink without any thrush or ulcers. Eyes: Pupils are equal and round reactive to light. Lymph nodes: No cervical, supraclavicular, inguinal or axillary lymphadenopathy.   Heart:regular rate and rhythm.  S1 and S2 without leg edema. Lung: Clear without any rhonchi or wheezes.  No dullness to percussion. Abdomin: Soft, nontender, nondistended with good bowel sounds.  No hepatosplenomegaly. Musculoskeletal: No joint deformity or effusion.  Full range of motion noted. Neurological: No deficits noted on motor, sensory and deep tendon reflex exam. Skin: No petechial rash or dryness.  Appeared moist.       Lab Results: Lab Results  Component Value Date   WBC 7.9 09/03/2018   HGB 13.5 09/03/2018   HCT 40.6 09/03/2018   MCV 82.4 09/03/2018   PLT  222 09/03/2018     Chemistry      Component Value Date/Time   NA 146 (H) 07/07/2018 0759   NA 142 09/10/2017 1509   K 4.0 07/07/2018 0759   K 4.2 09/10/2017 1509   CL 105 07/07/2018 0759   CO2 30 07/07/2018 0759   CO2 25 09/10/2017 1509   BUN 14 07/07/2018 0759   BUN 19.4 09/10/2017 1509   CREATININE 1.11 07/07/2018 0759   CREATININE 1.2 09/10/2017 1509      Component Value  Date/Time   CALCIUM 9.7 07/07/2018 0759   CALCIUM 9.7 09/10/2017 1509   ALKPHOS 89 07/07/2018 0759   ALKPHOS 84 09/10/2017 1509   AST 16 07/07/2018 0759   AST 14 09/10/2017 1509   ALT 21 07/07/2018 0759   ALT 18 09/10/2017 1509   BILITOT 0.5 07/07/2018 0759   BILITOT 0.38 09/10/2017 1509        Results for Bobby [name], Bobby [name] (MRN [id number]) as of 07/09/2018 08:16  Ref. Range 05/05/2018 08:18 07/07/2018 07:59  Prostate Specific Ag, Serum Latest Ref Range: 0.0 - 4.0 ng/mL <0.1 <0.1       Impression and Plan:  67 year old man with:  1.  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   He remains on Zytiga without any new complications.  His PSA remains undetectable and his disease is under control.  Risks and benefits of continuing this treatment long-term was reviewed today.  Potential long-term complications include hypertension, renal insufficiency and hypokalemia were reviewed.  For the time being is willing to continue I will continue to monitor and treat these complications.  2.  Androgen deprivation therapy: He continues to receive Lupron under the care of Dr. Jeffie Pollock.  I recommended continuing this indefinitely.  3.  Hypertension: He is currently on amlodipine 5 mg daily with better control of his blood pressure.  We will continue to monitor this closely and his blood pressure medication adjusted by his primary care physician.  4.  Electrolyte and liver function test monitoring: No issues reported at this time and these will be monitored continuously.  5.  Prognosis: His performance status remains excellent and aggressive therapy is warranted.  6.  Follow-up: Will be in 2 months.  15  minutes was spent with the patient face-to-face today.  More than 50% of time was dedicated to discussing the natural course of his disease, treatment options and managing complications related therapy.    Zola Button, MD 12/26/20191:31 PM

## 2018-09-04 LAB — PROSTATE-SPECIFIC AG, SERUM (LABCORP): Prostate Specific Ag, Serum: <0.1 ng/mL (ref 0.0–4.0)

## 2018-09-07 ENCOUNTER — Telehealth: Payer: Self-pay

## 2018-09-07 NOTE — Telephone Encounter (Signed)
-----   Message from Wyatt Portela, MD sent at 09/04/2018  8:52 AM EST ----- Please let him know his PSA is low

## 2018-09-07 NOTE — Telephone Encounter (Signed)
Contacted patient and made aware of results. 

## 2018-09-10 ENCOUNTER — Encounter: Payer: Self-pay | Admitting: Licensed Clinical Social Worker

## 2018-09-10 ENCOUNTER — Telehealth: Payer: Self-pay | Admitting: Licensed Clinical Social Worker

## 2018-09-10 ENCOUNTER — Ambulatory Visit: Payer: Self-pay | Admitting: Licensed Clinical Social Worker

## 2018-09-10 DIAGNOSIS — Z1379 Encounter for other screening for genetic and chromosomal anomalies: Secondary | ICD-10-CM | POA: Insufficient documentation

## 2018-09-10 NOTE — Progress Notes (Signed)
HPI:  Bobby [name] was previously seen in the [facility] on 08/03/2018 due to a personal and family history of cancer and concerns regarding a hereditary predisposition to cancer. Please refer to our prior cancer genetics clinic note for more information regarding Bobby [name] medical, social and family histories, and our assessment and recommendations, at the time. Bobby [name] recent genetic test results were disclosed to him, as well as recommendations warranted by these results. These results and recommendations are discussed in more detail below.   FAMILY HISTORY:  We obtained a detailed, 4-generation family history.  Significant diagnoses are listed below: Family History  Problem Relation Age of Onset  . Breast cancer Sister 34       "negative genetic testing"  . Pancreatic cancer Brother 37  . Breast cancer Maternal Aunt   . Kidney cancer Maternal Aunt        dx 19s   Bobby [name] has a son and a daughter, no cancer history. He has a sister who was diagnosed with breast cancer at 79 and had negative genetic testing, but this report was not available for review in the session. Bobby [name] also had a brother who was diagnosed with pancreatic cancer at 54 and died at 88.   Bobby [name] mother died at 28. She had a sister who had "multiple cancers." The patient knows she had at least kidney cancer in her 54's as well as breast cancer. No cancer in the patient's maternal cousins. Both of his maternal grandparents died in their 71's.  Bobby [name] father died at 57 from diabetes complications. He had 2 brothers and 3 sisters, no cancers. No cancer history for paternal cousins. Both his paternal grandparents also died in their 34's.  Bobby [name] is aware of previous family history of genetic testing for hereditary cancer risks. Patient's maternal ancestors are of Caucasian descent, and paternal ancestors are of Caucasian descent. There is no reported Ashkenazi Jewish ancestry. There is no  known consanguinity.  GENETIC TEST RESULTS: Genetic testing performed through [facility]'s Multi-Cancer Panel reported out on 09/10/18 showed no pathogenic mutations. The Multi-Cancer Panel offered by [facility] includes sequencing and/or deletion duplication testing of the following 84 genes: AIP, ALK, APC, ATM, AXIN2,BAP1,  BARD1, BLM, BMPR1A, BRCA1, BRCA2, BRIP1, CASR, CDC73, CDH1, CDK4, CDKN1B, CDKN1C, CDKN2A (p14ARF), CDKN2A (p16INK4a), CEBPA, CHEK2, CTNNA1, DICER1, DIS3L2, EGFR (c.2369C>T, p.Thr790Met variant only), EPCAM (Deletion/duplication testing only), FH, FLCN, GATA2, GPC3, GREM1 (Promoter region deletion/duplication testing only), HOXB13 (c.251G>A, p.Gly84Glu), HRAS, KIT, MAX, MEN1, MET, MITF (c.952G>A, p.Glu318Lys variant only), MLH1, MSH2, MSH3, MSH6, MUTYH, NBN, NF1, NF2, NTHL1, PALB2, PDGFRA, PHOX2B, PMS2, POLD1, POLE, POT1, PRKAR1A, PTCH1, PTEN, RAD50, RAD51C, RAD51D, RB1, RECQL4, RET, RUNX1, SDHAF2, SDHA (sequence changes only), SDHB, SDHC, SDHD, SMAD4, SMARCA4, SMARCB1, SMARCE1, STK11, SUFU, TERC, TERT, TMEM127, TP53, TSC1, TSC2, VHL, WRN and WT1.  A variant of uncertain significance (VUS) in a gene called CHEK2 was also noted.   The test report will be scanned into EPIC and will be located under the Molecular Pathology section of the Results Review tab. A portion of the result report is included below for reference.     We discussed with Bobby [name] that because current genetic testing is not perfect, it is possible there may be a gene mutation in one of these genes that current testing cannot detect, but that chance is small.  We also discussed, that there could be another gene that has not yet been discovered, or that we have not  yet tested, that is responsible for the cancer diagnoses in the family. It is also possible there is a hereditary cause for the cancer in the family that Bobby [name] did not inherit and therefore was not identified in his testing.  Therefore, it is important to remain in  touch with cancer genetics in the future so that we can continue to offer Bobby [name] the most up to date genetic testing.   Regarding the VUS in CHEK2: At this time, it is unknown if this variant is associated with increased cancer risk or if this is a normal finding, but most variants such as this get reclassified to being inconsequential. It should not be used to make medical management decisions. With time, we suspect the lab will determine the significance of this variant, if any. If we do learn more about it, we will try to contact Bobby [name] to discuss it further. However, it is important to stay in touch with [place] periodically and keep the address and phone number up to date.  ADDITIONAL GENETIC TESTING: We discussed with Bobby [name] that his genetic testing was fairly extensive.  If there are are genes identified to increase cancer risk that can be analyzed in the future, we would be happy to discuss and coordinate this testing at that time.    CANCER SCREENING RECOMMENDATIONS: Bobby [name] test result is considered negative (normal).  This means that we have not identified a hereditary cause for his personal and family history of cancer at this time.   his result indicates that it is unlikely Bobby [name] has an increased risk for a future cancer due to a mutation in one of these genes. This normal test also suggests that Bobby [name] cancer was most likely not due to an inherited predisposition associated with one of these genes.  Most cancers happen by chance and this negative test suggests that his cancer may fall into this category. While reassuring, this does not definitively rule out a hereditary predisposition to cancer. It is still possible that there could be genetic mutations that are undetectable by current technology, or genetic mutations in genes that have not been tested or identified to increase cancer risk.  Therefore, it is recommended he continue to follow the cancer management and screening  guidelines provided by his oncology and primary healthcare provider. An individual's cancer risk is not determined by genetic test results alone.  Overall cancer risk assessment includes additional factors such as personal medical history, family history, etc.  These should be used to make a personalized plan for cancer prevention and surveillance.    Due to the family history of pancreatic cancer in his brother he could also consider pancreatic cancer screening.  There are no specific recommendations regarding pancreatic cancer screening and there is not yet enough data to prove efficacy at detecting all pancreatic cancers early.  However, there are specialists who could discuss these options if he is interested.  Some techniques include abdominal MRI and endoscopic ultrasound with biopsy of suspicious masses.   RECOMMENDATIONS FOR FAMILY MEMBERS:  Relatives in this family might be at some increased risk of developing cancer, over the general population risk, simply due to the family history of cancer.  We recommended women in this family have a yearly mammogram beginning at age 34, or 46 years younger than the earliest onset of cancer, an annual clinical breast exam, and perform monthly breast self-exams. Women in this family should also have a gynecological exam as recommended by their  primary provider. All family members should have a colonoscopy as directed by their doctors.  All family members should inform their physicians about the family history of cancer so their doctors can make the most appropriate screening recommendations for them.   It is also possible there is a hereditary cause for the cancer in Bobby [name] family that he did not inherit and therefore was not identified in him.  We recommended his brother's children have genetic counseling and testing. Bobby [name] will let us know if we can be of any assistance in coordinating genetic counseling and/or testing for these family members.    FOLLOW-UP: Lastly, we discussed with Bobby [name] that cancer genetics is a rapidly advancing field and it is possible that new genetic tests will be appropriate for him and/or his family members in the future. We encouraged him to remain in contact with cancer genetics on an annual basis so we can update his personal and family histories and let him know of advances in cancer genetics that may benefit this family.   Our contact number was provided. Mr. Shugars questions were answered to his satisfaction, and he knows he is welcome to call us at anytime with additional questions or concerns.  Faith Rogue, MS Genetic Counselor [email or web address] Phone: [phone]

## 2018-09-10 NOTE — Telephone Encounter (Signed)
Revealed negative genetic testing.  Revealed that a VUS in CHEK2 was identified. This normal result is reassuring and indicates that it is unlikely Mr. Labell cancer is due to a hereditary cause.  It is unlikely that there is an increased risk of another cancer due to a mutation in one of these genes.  However, genetic testing is not perfect, and cannot definitively rule out a hereditary cause.  It will be important for him to keep in contact with genetics to learn if any additional testing may be needed in the future.

## 2018-09-18 ENCOUNTER — Other Ambulatory Visit: Payer: Self-pay | Admitting: Oncology

## 2018-09-18 DIAGNOSIS — C61 Malignant neoplasm of prostate: Secondary | ICD-10-CM

## 2018-09-23 DIAGNOSIS — Z6838 Body mass index (BMI) 38.0-38.9, adult: Secondary | ICD-10-CM | POA: Diagnosis not present

## 2018-09-23 DIAGNOSIS — N200 Calculus of kidney: Secondary | ICD-10-CM | POA: Diagnosis not present

## 2018-09-23 DIAGNOSIS — R7309 Other abnormal glucose: Secondary | ICD-10-CM | POA: Diagnosis not present

## 2018-09-23 DIAGNOSIS — R351 Nocturia: Secondary | ICD-10-CM | POA: Diagnosis not present

## 2018-09-23 DIAGNOSIS — K5730 Diverticulosis of large intestine without perforation or abscess without bleeding: Secondary | ICD-10-CM | POA: Diagnosis not present

## 2018-09-23 DIAGNOSIS — E669 Obesity, unspecified: Secondary | ICD-10-CM | POA: Diagnosis not present

## 2018-09-23 DIAGNOSIS — C61 Malignant neoplasm of prostate: Secondary | ICD-10-CM | POA: Diagnosis not present

## 2018-09-23 DIAGNOSIS — I10 Essential (primary) hypertension: Secondary | ICD-10-CM | POA: Diagnosis not present

## 2018-10-07 MED FILL — ABIRATERONE ACETATE 250 MG: 250 | 30 days supply | Qty: 120 | Fill #3

## 2018-10-07 MED FILL — predniSONE 5 MG TABS: 5 | 30 days supply | Qty: 30 | Fill #0

## 2018-11-02 MED FILL — predniSONE 5 MG TABS: 5 | 30 days supply | Qty: 30 | Fill #1

## 2018-11-02 MED FILL — ABIRATERONE ACETATE 250 MG: 250 | 30 days supply | Qty: 120 | Fill #4

## 2018-11-06 ENCOUNTER — Telehealth: Payer: Self-pay | Admitting: Oncology

## 2018-11-06 ENCOUNTER — Inpatient Hospital Stay (HOSPITAL_BASED_OUTPATIENT_CLINIC_OR_DEPARTMENT_OTHER): Payer: Medicare HMO | Admitting: Oncology

## 2018-11-06 ENCOUNTER — Inpatient Hospital Stay: Payer: Medicare HMO | Attending: Oncology

## 2018-11-06 VITALS — BP 153/77 | HR 82 | Temp 97.9°F | Resp 18 | Ht 67.0 in | Wt 248.2 lb

## 2018-11-06 DIAGNOSIS — I10 Essential (primary) hypertension: Secondary | ICD-10-CM | POA: Diagnosis not present

## 2018-11-06 DIAGNOSIS — Z79899 Other long term (current) drug therapy: Secondary | ICD-10-CM | POA: Insufficient documentation

## 2018-11-06 DIAGNOSIS — C61 Malignant neoplasm of prostate: Secondary | ICD-10-CM

## 2018-11-06 LAB — CMP (CANCER CENTER ONLY)
ALT: 17 U/L (ref 0–44)
AST: 16 U/L (ref 15–41)
Albumin: 3.7 g/dL (ref 3.5–5.0)
Alkaline Phosphatase: 92 U/L (ref 38–126)
Anion gap: 8 (ref 5–15)
BUN: 18 mg/dL (ref 8–23)
CO2: 28 mmol/L (ref 22–32)
Calcium: 9.4 mg/dL (ref 8.9–10.3)
Chloride: 106 mmol/L (ref 98–111)
Creatinine: 1.19 mg/dL (ref 0.61–1.24)
GFR, Est AFR Am: >60 mL/min (ref >60)
GFR, Estimated: >60 mL/min (ref >60)
Glucose, Bld: 135 mg/dL — ABNORMAL HIGH (ref 70–99)
POTASSIUM: 3.7 mmol/L (ref 3.5–5.1)
SODIUM: 142 mmol/L (ref 135–145)
Total Bilirubin: 0.5 mg/dL (ref 0.3–1.2)
Total Protein: 6.8 g/dL (ref 6.5–8.1)

## 2018-11-06 LAB — CBC WITH DIFFERENTIAL (CANCER CENTER ONLY)
Abs Immature Granulocytes: 0.02 10*3/uL (ref 0.00–0.07)
Basophils Absolute: 0 10*3/uL (ref 0.0–0.1)
Basophils Relative: 1 %
Eosinophils Absolute: 0.2 10*3/uL (ref 0.0–0.5)
Eosinophils Relative: 3 %
HCT: 43.2 % (ref 39.0–52.0)
Hemoglobin: 14.1 g/dL (ref 13.0–17.0)
Immature Granulocytes: 0 %
LYMPHS PCT: 16 %
Lymphs Abs: 1 10*3/uL (ref 0.7–4.0)
MCH: 27.7 pg (ref 26.0–34.0)
MCHC: 32.6 g/dL (ref 30.0–36.0)
MCV: 84.9 fL (ref 80.0–100.0)
Monocytes Absolute: 0.5 10*3/uL (ref 0.1–1.0)
Monocytes Relative: 7 %
NEUTROS ABS: 4.8 10*3/uL (ref 1.7–7.7)
Neutrophils Relative %: 73 %
Platelet Count: 209 10*3/uL (ref 150–400)
RBC: 5.09 MIL/uL (ref 4.22–5.81)
RDW: 14.1 % (ref 11.5–15.5)
WBC Count: 6.5 10*3/uL (ref 4.0–10.5)
nRBC: 0 % (ref 0.0–0.2)

## 2018-11-06 NOTE — Progress Notes (Signed)
Hematology and Oncology Follow Up Visit  Bobby [name] [id number] September 22, 1950 68 y.o. 11/06/2018 10:20 AM [name], MDSouth, [name], MD   Principle Diagnosis: 68 year old man with castration-sensitive prostate cancer with lymphadenopathy diagnosed in August 2018.  He is Gleason score 4+5 = 9 and PSA of 14.7 at time of diagnosis.   Prior Therapy: He is status post prostate biopsy in August 2018.  Current therapy:  Androgen deprivation therapy under the care of Dr. Jeffie Pollock started in September 2018.  Zytiga 1000 mg daily with prednisone 5 mg daily started in October 2018.  Interim History: Mr. Belay presents today for repeat evaluation.  Since the last visit, he reports no major changes in his health.  He did report some scleral erythema but otherwise no other complaints.  He did have some itching associated with it but no visual changes.  He denies any Zytiga complications including nausea, or worsening edema.  His appetite remains excellent and his weight is stable.  He he does report slight fatigue which he is managing reasonably well at this time.  Denies any bone pain or pathological fractures.   Patient denied any alteration mental status, neuropathy, confusion or dizziness.  Denies any headaches or lethargy.  Denies any night sweats, weight loss or changes in appetite.  Denied orthopnea, dyspnea on exertion or chest discomfort.  Denies shortness of breath, difficulty breathing hemoptysis or cough.  Denies any abdominal distention, nausea, early satiety or dyspepsia.  Denies any hematuria, frequency, dysuria or nocturia.  Denies any skin irritation, dryness or rash.  Denies any ecchymosis or petechiae.  Denies any lymphadenopathy or clotting.  Denies any heat or cold intolerance.  Denies any anxiety or depression.  Remaining review of system is negative.    Medications: I have reviewed the patient's current medications.  Current Outpatient Medications  Medication Sig Dispense Refill   . abiraterone acetate (ZYTIGA) 250 MG tablet Take 4 tablets (1,000 mg total) by mouth daily. Take on an empty stomach, 1 hour before or 2 hours after a meal. 120 tablet 11  . amLODipine (NORVASC) 5 MG tablet Take 5 mg by mouth daily.    . predniSONE (DELTASONE) 5 MG tablet TAKE 1 TABLET BY MOUTH DAILY WITH BREAKFAST. 30 tablet 2  . tamsulosin (FLOMAX) 0.4 MG CAPS capsule Take 1 capsule (0.4 mg total) by mouth daily after supper. 30 capsule 0   No current facility-administered medications for this visit.      Allergies: No Known Allergies  Past Medical History, Surgical history, Social history, and Family History reviewed again and unchanged.    Physical Exam: Blood pressure (!) 153/77, pulse 82, temperature 97.9 F (36.6 C), temperature source Oral, resp. rate 18, height 5\' 7"  (1.702 m), weight 248 lb 3.2 oz (112.6 kg), SpO2 99 %.   ECOG: 1     General appearance: Alert, awake without any distress. Head: Atraumatic without abnormalities Oropharynx: Without any thrush or ulcers. Eyes: Erythema noted. Lymph nodes: No lymphadenopathy noted in the cervical, supraclavicular, or axillary nodes Heart:regular rate and rhythm, without any murmurs or gallops.   Lung: Clear to auscultation without any rhonchi, wheezes or dullness to percussion. Abdomin: Soft, nontender without any shifting dullness or ascites. Musculoskeletal: No clubbing or cyanosis. Neurological: No motor or sensory deficits. Skin: No rashes or lesions.       Lab Results: Lab Results  Component Value Date   WBC 7.9 09/03/2018   HGB 13.5 09/03/2018   HCT 40.6 09/03/2018   MCV 82.4 09/03/2018  PLT 222 09/03/2018     Chemistry      Component Value Date/Time   NA 144 09/03/2018 1316   NA 142 09/10/2017 1509   K 3.3 (L) 09/03/2018 1316   K 4.2 09/10/2017 1509   CL 107 09/03/2018 1316   CO2 28 09/03/2018 1316   CO2 25 09/10/2017 1509   BUN 14 09/03/2018 1316   BUN 19.4 09/10/2017 1509   CREATININE  1.01 09/03/2018 1316   CREATININE 1.2 09/10/2017 1509      Component Value Date/Time   CALCIUM 9.3 09/03/2018 1316   CALCIUM 9.7 09/10/2017 1509   ALKPHOS 87 09/03/2018 1316   ALKPHOS 84 09/10/2017 1509   AST 20 09/03/2018 1316   AST 14 09/10/2017 1509   ALT 23 09/03/2018 1316   ALT 18 09/10/2017 1509   BILITOT 0.4 09/03/2018 1316   BILITOT 0.38 09/10/2017 1509         Results for Bobby [name] (MRN [id number]) as of 11/06/2018 10:19  Ref. Range 07/07/2018 07:59 09/03/2018 13:16  Prostate Specific Ag, Serum Latest Ref Range: 0.0 - 4.0 ng/mL <0.1 <0.1       Impression and Plan:  68 year old man with:  1.  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   He continues to take Zytiga since October 2018 without any major complications.  His PSA remains undetectable without any issues.  Risks and benefits of continuing this therapy long-term was reviewed again.  Complications include weight gain, hypertension and edema were reiterated.  Alternative therapies were also reviewed including systemic chemotherapy, Xtandi and Xofigo.  These would be an option if he develop castration resistant disease.  2.  Androgen deprivation therapy: He is currently on Lupron which she has tolerated very well.  I recommended continuing that indefinitely under the care of Dr. Jeffie Pollock.  3.  Hypertension: Pressure is mildly elevated but has been under control.  4.  Electrolyte and liver function test monitoring: His potassium is mildly low and will be monitored periodically and replace as needed.  No issues with his liver function test.  5.  Prognosis and goals of care: Therapy remains palliative although his performance status remains adequate and aggressive therapy is warranted.  6.  Follow-up: Will be in May 2020.  15  minutes was spent with the patient face-to-face today.  More than 50% of time was dedicated to reviewing the  natural course of this disease, complications related therapy and alternative therapy for the future.    Zola Button, MD 2/28/202010:20 AM

## 2018-11-06 NOTE — Telephone Encounter (Signed)
Scheduled appt per 02/28 los. ° °Printed calendar and avs. °

## 2018-11-07 LAB — PROSTATE-SPECIFIC AG, SERUM (LABCORP)

## 2018-11-09 ENCOUNTER — Telehealth: Payer: Self-pay

## 2018-11-09 NOTE — Telephone Encounter (Signed)
-----   Message from Wyatt Portela, MD sent at 11/09/2018  7:20 AM EST ----- Please let him know his PSA is low.

## 2018-11-09 NOTE — Telephone Encounter (Signed)
Contacted patient and made aware of PSA results. 

## 2018-11-25 MED FILL — predniSONE 5 MG TABS: 5 | 30 days supply | Qty: 30 | Fill #2

## 2018-11-25 MED FILL — ABIRATERONE ACETATE 250 MG: 250 | 30 days supply | Qty: 120 | Fill #5

## 2018-11-27 DIAGNOSIS — H1033 Unspecified acute conjunctivitis, bilateral: Secondary | ICD-10-CM | POA: Diagnosis not present

## 2018-12-01 DIAGNOSIS — H1033 Unspecified acute conjunctivitis, bilateral: Secondary | ICD-10-CM | POA: Diagnosis not present

## 2018-12-07 DIAGNOSIS — H1033 Unspecified acute conjunctivitis, bilateral: Secondary | ICD-10-CM | POA: Diagnosis not present

## 2018-12-19 ENCOUNTER — Other Ambulatory Visit: Payer: Self-pay | Admitting: Oncology

## 2018-12-19 DIAGNOSIS — C61 Malignant neoplasm of prostate: Secondary | ICD-10-CM

## 2018-12-19 MED FILL — ABIRATERONE ACETATE 250 MG: 250 | 30 days supply | Qty: 120 | Fill #6

## 2018-12-19 MED FILL — predniSONE 5 MG TABS: 5 | 30 days supply | Qty: 30 | Fill #0

## 2019-01-13 MED FILL — predniSONE 5 MG TABS: 5 | 30 days supply | Qty: 30 | Fill #1

## 2019-01-13 MED FILL — ABIRATERONE ACETATE 250 MG: 250 | 30 days supply | Qty: 120 | Fill #7

## 2019-01-22 ENCOUNTER — Inpatient Hospital Stay (HOSPITAL_BASED_OUTPATIENT_CLINIC_OR_DEPARTMENT_OTHER): Payer: Medicare HMO | Admitting: Oncology

## 2019-01-22 ENCOUNTER — Other Ambulatory Visit: Payer: Self-pay

## 2019-01-22 ENCOUNTER — Inpatient Hospital Stay: Payer: Medicare HMO | Attending: Oncology

## 2019-01-22 VITALS — BP 143/81 | HR 80 | Temp 98.5°F | Resp 18 | Ht 67.0 in | Wt 248.4 lb

## 2019-01-22 DIAGNOSIS — Z79899 Other long term (current) drug therapy: Secondary | ICD-10-CM | POA: Insufficient documentation

## 2019-01-22 DIAGNOSIS — I10 Essential (primary) hypertension: Secondary | ICD-10-CM

## 2019-01-22 DIAGNOSIS — C61 Malignant neoplasm of prostate: Secondary | ICD-10-CM

## 2019-01-22 LAB — CBC WITH DIFFERENTIAL (CANCER CENTER ONLY)
Abs Immature Granulocytes: 0.02 10*3/uL (ref 0.00–0.07)
Basophils Absolute: 0 10*3/uL (ref 0.0–0.1)
Basophils Relative: 1 %
Eosinophils Absolute: 0.2 10*3/uL (ref 0.0–0.5)
Eosinophils Relative: 2 %
HCT: 42.1 % (ref 39.0–52.0)
Hemoglobin: 13.7 g/dL (ref 13.0–17.0)
Immature Granulocytes: 0 %
Lymphocytes Relative: 13 %
Lymphs Abs: 1.1 10*3/uL (ref 0.7–4.0)
MCH: 27.7 pg (ref 26.0–34.0)
MCHC: 32.5 g/dL (ref 30.0–36.0)
MCV: 85.2 fL (ref 80.0–100.0)
Monocytes Absolute: 0.5 10*3/uL (ref 0.1–1.0)
Monocytes Relative: 6 %
Neutro Abs: 6.6 10*3/uL (ref 1.7–7.7)
Neutrophils Relative %: 78 %
Platelet Count: 210 10*3/uL (ref 150–400)
RBC: 4.94 MIL/uL (ref 4.22–5.81)
RDW: 13.8 % (ref 11.5–15.5)
WBC Count: 8.5 10*3/uL (ref 4.0–10.5)
nRBC: 0 % (ref 0.0–0.2)

## 2019-01-22 LAB — CMP (CANCER CENTER ONLY)
ALT: 19 U/L (ref 0–44)
AST: 15 U/L (ref 15–41)
Albumin: 3.7 g/dL (ref 3.5–5.0)
Alkaline Phosphatase: 92 U/L (ref 38–126)
Anion gap: 9 (ref 5–15)
BUN: 18 mg/dL (ref 8–23)
CO2: 26 mmol/L (ref 22–32)
Calcium: 9.6 mg/dL (ref 8.9–10.3)
Chloride: 106 mmol/L (ref 98–111)
Creatinine: 1.13 mg/dL (ref 0.61–1.24)
GFR, Est AFR Am: >60 mL/min (ref >60)
GFR, Estimated: >60 mL/min (ref >60)
Glucose, Bld: 111 mg/dL — ABNORMAL HIGH (ref 70–99)
Potassium: 3.5 mmol/L (ref 3.5–5.1)
Sodium: 141 mmol/L (ref 135–145)
Total Bilirubin: 0.6 mg/dL (ref 0.3–1.2)
Total Protein: 6.7 g/dL (ref 6.5–8.1)

## 2019-01-22 NOTE — Progress Notes (Signed)
Hematology and Oncology Follow Up Visit  Bobby [name] [id number] [birth year] 68 y.o. 01/22/2019 9:33 AM Bobby [name], MDSouth, Bobby Main, MD   Principle Diagnosis: 68 year old man with advanced prostate cancer with lymphadenopathy diagnosed in August 2018.  He has castration-sensitive after presenting with Gleason score 4+5 = 9 and PSA of 14.7    Prior Therapy: He is status post prostate biopsy in August 2018.  Current therapy:  Androgen deprivation therapy under the care of Dr. Jeffie [name] started in September 2018.  Zytiga 1000 mg daily with prednisone 5 mg daily started in October 2018.  Interim History: Bobby [name] returns today for a follow-up visit.  Since the last visit, he continues to tolerate Zytiga without any concerns.  He does report some mild fatigue and weight gain but no other complications.  Continues to be active and attends to activities of daily living.  He denies recent hospitalizations or illnesses.  He denies any decline in his quality of life or recent infections.  He denied headaches, blurry vision, syncope or seizures.  Denies any fevers, chills or sweats.  Denied chest pain, palpitation, orthopnea or leg edema.  Denied cough, wheezing or hemoptysis.  Denied nausea, vomiting or abdominal pain.  Denies any constipation or diarrhea.  Denies any frequency urgency or hesitancy.  Denies any arthralgias or myalgias.  Denies any skin rashes or lesions.  Denies any bleeding or clotting tendency.  Denies any easy bruising.  Denies any hair or nail changes.  Denies any anxiety or depression.  Remaining review of system is negative.    Medications: I have reviewed the patient's current medications.  Current Outpatient Medications  Medication Sig Dispense Refill  . abiraterone acetate (ZYTIGA) 250 MG tablet Take 4 tablets (1,000 mg total) by mouth daily. Take on an empty stomach, 1 hour before or 2 hours after a meal. 120 tablet 11  . amLODipine (NORVASC) 5 MG tablet Take 5 mg by  mouth daily.    . predniSONE (DELTASONE) 5 MG tablet TAKE 1 TABLET BY MOUTH DAILY WITH BREAKFAST. 30 tablet 2  . tamsulosin (FLOMAX) 0.4 MG CAPS capsule Take 1 capsule (0.4 mg total) by mouth daily after supper. 30 capsule 0   No current facility-administered medications for this visit.      Allergies: No Known Allergies  Past Medical History, Surgical history, Social history, and Family History reviewed again and unchanged.    Physical Exam: Blood pressure (!) 143/81, pulse 80, temperature 98.5 F (36.9 C), temperature source Oral, resp. rate 18, height 5\' 7"  (1.702 m), weight 248 lb 6.4 oz (112.7 kg), SpO2 98 %.   ECOG: 1     General appearance: Comfortable appearing without any discomfort Head: Normocephalic without any trauma Oropharynx: Mucous membranes are moist and pink without any thrush or ulcers. Eyes: Pupils are equal and round reactive to light. Lymph nodes: No cervical, supraclavicular, inguinal or axillary lymphadenopathy.   Heart:regular rate and rhythm.  S1 and S2 without leg edema. Lung: Clear without any rhonchi or wheezes.  No dullness to percussion. Abdomin: Soft, nontender, nondistended with good bowel sounds.  No hepatosplenomegaly. Musculoskeletal: No joint deformity or effusion.  Full range of motion noted. Neurological: No deficits noted on motor, sensory and deep tendon reflex exam. Skin: No petechial rash or dryness.  Appeared moist.         Lab Results: Lab Results  Component Value Date   WBC 8.5 01/22/2019   HGB 13.7 01/22/2019   HCT 42.1 01/22/2019   MCV 85.2 01/22/2019  PLT 210 01/22/2019     Chemistry      Component Value Date/Time   NA 142 11/06/2018 1009   NA 142 09/10/2017 1509   K 3.7 11/06/2018 1009   K 4.2 09/10/2017 1509   CL 106 11/06/2018 1009   CO2 28 11/06/2018 1009   CO2 25 09/10/2017 1509   BUN 18 11/06/2018 1009   BUN 19.4 09/10/2017 1509   CREATININE 1.19 11/06/2018 1009   CREATININE 1.2 09/10/2017 1509       Component Value Date/Time   CALCIUM 9.4 11/06/2018 1009   CALCIUM 9.7 09/10/2017 1509   ALKPHOS 92 11/06/2018 1009   ALKPHOS 84 09/10/2017 1509   AST 16 11/06/2018 1009   AST 14 09/10/2017 1509   ALT 17 11/06/2018 1009   ALT 18 09/10/2017 1509   BILITOT 0.5 11/06/2018 1009   BILITOT 0.38 09/10/2017 1509       Results for Bobby [name], Bobby [name] (MRN [id number]) as of 01/22/2019 09:34  Ref. Range 11/06/2018 10:09  Prostate Specific Ag, Serum Latest Ref Range: 0.0 - 4.0 ng/mL <0.1         Impression and Plan:  68 year old man with:  1.  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    He has tolerated Zytiga without any major concerns at this time.  His PSA remains undetectable.  Risks and benefits of continuing this therapy long-term reviewed today and he is agreeable to continue.  These complications include hypertension, weight gain and adrenal insufficiency.  After discussion he has no objections at this time.  Different salvage therapy may be needed he developed castration hyper resistant disease.  2.  Androgen deprivation therapy: He expressed interest to start Lupron at the cancer center for consolidation of care.  He will start Lupron 30 mg every 4 months on Jan 29, 2019.  Complication associated with this therapy include weight gain, sexual dysfunction among others.  He understands that treating his prostate cancer relies essentially on continuous androgen deprivation therapy.  3.  Hypertension: No recent issues or complications at this time.  His blood pressure remains under reasonable control.  4.  Electrolyte and liver function test monitoring: His potassium will be repeated today as well as liver function test.  He is to be monitored on Zytiga.  5.  Prognosis and goals of care: His disease is incurable but performance status is excellent and aggressive therapy is warranted.  6.  Follow-up: In July 2020 for repeat evaluation.  25  minutes was spent  with the patient face-to-face today.  More than 50% of time was spent on reviewing his disease status, treatment options and answering questions regarding future plan of care.    Bobby Button, MD 5/15/20209:33 AM

## 2019-01-23 LAB — PROSTATE-SPECIFIC AG, SERUM (LABCORP): Prostate Specific Ag, Serum: <0.1 ng/mL (ref 0.0–4.0)

## 2019-01-25 ENCOUNTER — Telehealth: Payer: Self-pay

## 2019-01-25 ENCOUNTER — Telehealth: Payer: Self-pay | Admitting: Oncology

## 2019-01-25 DIAGNOSIS — H524 Presbyopia: Secondary | ICD-10-CM | POA: Diagnosis not present

## 2019-01-25 DIAGNOSIS — H35033 Hypertensive retinopathy, bilateral: Secondary | ICD-10-CM | POA: Diagnosis not present

## 2019-01-25 NOTE — Telephone Encounter (Signed)
Called patient and let him know that per Dr. Alen Blew, PSA is low.

## 2019-01-25 NOTE — Telephone Encounter (Signed)
-----   Message from Wyatt Portela, MD sent at 01/25/2019  8:13 AM EDT ----- Please let him know his PSA is low.

## 2019-01-25 NOTE — Telephone Encounter (Signed)
Scheduled appt per 5/15 [facility] message - spoke with patient . They are aware of appt date and time

## 2019-01-29 ENCOUNTER — Other Ambulatory Visit: Payer: Self-pay

## 2019-01-29 ENCOUNTER — Inpatient Hospital Stay: Payer: Medicare HMO

## 2019-01-29 VITALS — BP 154/75 | HR 77 | Temp 97.1°F | Resp 18

## 2019-01-29 DIAGNOSIS — Z79899 Other long term (current) drug therapy: Secondary | ICD-10-CM | POA: Diagnosis not present

## 2019-01-29 DIAGNOSIS — C61 Malignant neoplasm of prostate: Secondary | ICD-10-CM

## 2019-01-29 DIAGNOSIS — I10 Essential (primary) hypertension: Secondary | ICD-10-CM | POA: Diagnosis not present

## 2019-01-29 MED ORDER — LEUPROLIDE ACETATE (4 MONTH) 30 MG IM KIT
30.0000 mg | PACK | Freq: Once | INTRAMUSCULAR | Status: AC
  Administered 2019-01-29: 30 mg via INTRAMUSCULAR
  Filled 2019-01-29: qty 30

## 2019-02-08 MED FILL — ABIRATERONE ACETATE 250 MG: 250 | 30 days supply | Qty: 120 | Fill #8

## 2019-02-08 MED FILL — predniSONE 5 MG TABS: 5 | 30 days supply | Qty: 30 | Fill #2

## 2019-02-24 MED FILL — MYRBETRIQ ER 25 MG TABLET: 25 | 90 days supply | Qty: 90 | Fill #0

## 2019-03-03 DIAGNOSIS — R69 Illness, unspecified: Secondary | ICD-10-CM | POA: Diagnosis not present

## 2019-03-11 MED FILL — ABIRATERONE ACETATE 250 MG: 250 | 30 days supply | Qty: 120 | Fill #9

## 2019-03-19 DIAGNOSIS — R7309 Other abnormal glucose: Secondary | ICD-10-CM | POA: Diagnosis not present

## 2019-03-19 DIAGNOSIS — I10 Essential (primary) hypertension: Secondary | ICD-10-CM | POA: Diagnosis not present

## 2019-03-19 DIAGNOSIS — R5383 Other fatigue: Secondary | ICD-10-CM | POA: Diagnosis not present

## 2019-03-19 DIAGNOSIS — Z125 Encounter for screening for malignant neoplasm of prostate: Secondary | ICD-10-CM | POA: Diagnosis not present

## 2019-03-22 DIAGNOSIS — I10 Essential (primary) hypertension: Secondary | ICD-10-CM | POA: Diagnosis not present

## 2019-03-22 DIAGNOSIS — R82998 Other abnormal findings in urine: Secondary | ICD-10-CM | POA: Diagnosis not present

## 2019-03-26 DIAGNOSIS — N200 Calculus of kidney: Secondary | ICD-10-CM | POA: Diagnosis not present

## 2019-03-26 DIAGNOSIS — R5383 Other fatigue: Secondary | ICD-10-CM | POA: Diagnosis not present

## 2019-03-26 DIAGNOSIS — C61 Malignant neoplasm of prostate: Secondary | ICD-10-CM | POA: Diagnosis not present

## 2019-03-26 DIAGNOSIS — E669 Obesity, unspecified: Secondary | ICD-10-CM | POA: Diagnosis not present

## 2019-03-26 DIAGNOSIS — R0609 Other forms of dyspnea: Secondary | ICD-10-CM | POA: Diagnosis not present

## 2019-03-26 DIAGNOSIS — R7309 Other abnormal glucose: Secondary | ICD-10-CM | POA: Diagnosis not present

## 2019-03-26 DIAGNOSIS — I10 Essential (primary) hypertension: Secondary | ICD-10-CM | POA: Diagnosis not present

## 2019-03-26 DIAGNOSIS — Z0000 Encounter for general adult medical examination without abnormal findings: Secondary | ICD-10-CM | POA: Diagnosis not present

## 2019-04-07 ENCOUNTER — Inpatient Hospital Stay: Payer: Medicare HMO

## 2019-04-07 ENCOUNTER — Inpatient Hospital Stay: Payer: Medicare HMO | Attending: Oncology | Admitting: Oncology

## 2019-04-07 ENCOUNTER — Other Ambulatory Visit: Payer: Self-pay

## 2019-04-07 ENCOUNTER — Telehealth: Payer: Self-pay | Admitting: Oncology

## 2019-04-07 VITALS — BP 149/82 | HR 72 | Temp 97.8°F | Resp 20 | Wt 249.2 lb

## 2019-04-07 DIAGNOSIS — I10 Essential (primary) hypertension: Secondary | ICD-10-CM | POA: Diagnosis not present

## 2019-04-07 DIAGNOSIS — Z79899 Other long term (current) drug therapy: Secondary | ICD-10-CM | POA: Diagnosis not present

## 2019-04-07 DIAGNOSIS — C61 Malignant neoplasm of prostate: Secondary | ICD-10-CM

## 2019-04-07 LAB — CBC WITH DIFFERENTIAL (CANCER CENTER ONLY)
Abs Immature Granulocytes: 0.01 10*3/uL (ref 0.00–0.07)
Basophils Absolute: 0.1 10*3/uL (ref 0.0–0.1)
Basophils Relative: 1 %
Eosinophils Absolute: 0.2 10*3/uL (ref 0.0–0.5)
Eosinophils Relative: 3 %
HCT: 40.4 % (ref 39.0–52.0)
Hemoglobin: 13.3 g/dL (ref 13.0–17.0)
Immature Granulocytes: 0 %
Lymphocytes Relative: 18 %
Lymphs Abs: 1.1 10*3/uL (ref 0.7–4.0)
MCH: 27.8 pg (ref 26.0–34.0)
MCHC: 32.9 g/dL (ref 30.0–36.0)
MCV: 84.3 fL (ref 80.0–100.0)
Monocytes Absolute: 0.5 10*3/uL (ref 0.1–1.0)
Monocytes Relative: 8 %
Neutro Abs: 4 10*3/uL (ref 1.7–7.7)
Neutrophils Relative %: 70 %
Platelet Count: 190 10*3/uL (ref 150–400)
RBC: 4.79 MIL/uL (ref 4.22–5.81)
RDW: 13.7 % (ref 11.5–15.5)
WBC Count: 5.8 10*3/uL (ref 4.0–10.5)
nRBC: 0 % (ref 0.0–0.2)

## 2019-04-07 LAB — CMP (CANCER CENTER ONLY)
ALT: 13 U/L (ref 0–44)
AST: 13 U/L — ABNORMAL LOW (ref 15–41)
Albumin: 3.6 g/dL (ref 3.5–5.0)
Alkaline Phosphatase: 84 U/L (ref 38–126)
Anion gap: 9 (ref 5–15)
BUN: 15 mg/dL (ref 8–23)
CO2: 23 mmol/L (ref 22–32)
Calcium: 9.2 mg/dL (ref 8.9–10.3)
Chloride: 108 mmol/L (ref 98–111)
Creatinine: 0.92 mg/dL (ref 0.61–1.24)
GFR, Est AFR Am: >60 mL/min (ref >60)
GFR, Estimated: >60 mL/min (ref >60)
Glucose, Bld: 125 mg/dL — ABNORMAL HIGH (ref 70–99)
Potassium: 3.8 mmol/L (ref 3.5–5.1)
Sodium: 140 mmol/L (ref 135–145)
Total Bilirubin: 0.5 mg/dL (ref 0.3–1.2)
Total Protein: 6.5 g/dL (ref 6.5–8.1)

## 2019-04-07 NOTE — Telephone Encounter (Signed)
Called and spoke with patient. Confirmed date and time  °

## 2019-04-07 NOTE — Progress Notes (Signed)
Hematology and Oncology Follow Up Visit  Bobby [name] [id number] 1951-02-19 68 y.o. 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   Prior Therapy: He is status post prostate biopsy in August 2018.  Current therapy:  Androgen deprivation therapy under the care of Dr. Jeffie Pollock started in September 2018.  He is currently receiving Lupron 30 mg every 4 months started on Jan 29, 2019.  Zytiga 1000 mg daily with prednisone 5 mg daily started in October 2018.  Interim History: [name] presents today for a return evaluation.  Since the last visit, he reports no major changes in his health.  He is to tolerate Zytiga without any recent issues.  He does report some fatigue and weight gain but no edema or dyspepsia.  His performance status and quality of life remained reasonable.  He denies any worsening back pain or discomfort.  He continues to be active and attends to activities of daily living.  Patient denied any alteration mental status, neuropathy, confusion or dizziness.  Denies any headaches or lethargy.  Denies any night sweats, weight loss or changes in appetite.  Denied orthopnea, dyspnea on exertion or chest discomfort.  Denies shortness of breath, difficulty breathing hemoptysis or cough.  Denies any abdominal distention, nausea, early satiety or dyspepsia.  Denies any hematuria, frequency, dysuria or nocturia.  Denies any skin irritation, dryness or rash.  Denies any ecchymosis or petechiae.  Denies any lymphadenopathy or clotting.  Denies any heat or cold intolerance.  Denies any anxiety or depression.  Remaining review of system is negative.     Medications: Unchanged by review. Current Outpatient Medications  Medication Sig Dispense Refill  . abiraterone acetate (ZYTIGA) 250 MG tablet Take 4  tablets (1,000 mg total) by mouth daily. Take on an empty stomach, 1 hour before or 2 hours after a meal. 120 tablet 11  . amLODipine (NORVASC) 5 MG tablet Take 5 mg by mouth daily.    . predniSONE (DELTASONE) 5 MG tablet TAKE 1 TABLET BY MOUTH DAILY WITH BREAKFAST. 30 tablet 2  . tamsulosin (FLOMAX) 0.4 MG CAPS capsule Take 1 capsule (0.4 mg total) by mouth daily after supper. 30 capsule 0   No current facility-administered medications for this visit.      Allergies: No Known Allergies  Past Medical History, Surgical history, Social history, and Family History updated without any changes.    Physical Exam: Blood pressure (!) 149/82, pulse 72, temperature 97.8 F (36.6 C), temperature source Temporal, resp. rate 20, weight 249 lb 3.2 oz (113 kg), SpO2 98 %.   ECOG: 1      General appearance: Alert, awake without any distress. Head: Atraumatic without abnormalities Oropharynx: Without any thrush or ulcers. Eyes: No scleral icterus. Lymph nodes: No lymphadenopathy noted in the cervical, supraclavicular, or axillary nodes Heart:regular rate and rhythm, without any murmurs or gallops.   Lung: Clear to auscultation without any rhonchi, wheezes or dullness to percussion. Abdomin: Soft, nontender without any shifting dullness or ascites. Musculoskeletal: No clubbing or cyanosis. Neurological: No motor or sensory deficits. Skin: No rashes or lesions. Psychiatric: Mood and affect appeared normal.         Lab Results: Lab Results  Component Value Date   WBC 5.8 04/07/2019   HGB 13.3 04/07/2019   HCT 40.4 04/07/2019   MCV 84.3 04/07/2019   PLT  190 04/07/2019     Chemistry      Component Value Date/Time   NA 141 01/22/2019 0907   NA 142 09/10/2017 1509   K 3.5 01/22/2019 0907   K 4.2 09/10/2017 1509   CL 106 01/22/2019 0907   CO2 26 01/22/2019 0907   CO2 25 09/10/2017 1509   BUN 18 01/22/2019 0907   BUN 19.4 09/10/2017 1509   CREATININE 1.13 01/22/2019 0907    CREATININE 1.2 09/10/2017 1509      Component Value Date/Time   CALCIUM 9.6 01/22/2019 0907   CALCIUM 9.7 09/10/2017 1509   ALKPHOS 92 01/22/2019 0907   ALKPHOS 84 09/10/2017 1509   AST 15 01/22/2019 0907   AST 14 09/10/2017 1509   ALT 19 01/22/2019 0907   ALT 18 09/10/2017 1509   BILITOT 0.6 01/22/2019 0907   BILITOT 0.38 09/10/2017 1509        Results for Bobby [name], Bobby [name] (MRN [id number]) as of 04/07/2019 09:07  Ref. Range 11/06/2018 10:09 01/22/2019 09:07  Prostate Specific Ag, Serum Latest Ref Range: 0.0 - 4.0 ng/mL <0.1 <0.1         Impression and Plan:  68 year old man with:  1.  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    He remains on Zytiga without any major complications with excellent PSA response.  Risks and benefits of continuing this medication and alternative therapy were reviewed today.  Long-term issues such as renal insufficiency edema as well as hypertension were reiterated.  At this time he has no difficulties taking this medication and different salvage therapy such as systemic chemotherapy will be deferred.  2.  Androgen deprivation therapy: He is currently receiving Lupron that was restarted on Jan 29, 2019.  Long-term complications including weight gain, osteoporosis among others were reiterated.  His next injection will be in September 2020.  3.  Hypertension: Blood pressure remains manageable at this time we will continue to monitor on Zytiga.  4.  Electrolyte and liver function test monitoring: Electrolytes and liver function tests remains normal and needs to be monitored on Zytiga.  5.  Prognosis and goals of care: Therapy remains palliative although aggressive measures are warranted.  His disease remains incurable however is treatable.  6.  Follow-up: In 2 months for repeat evaluation as well as Lupron injection.  25  minutes was spent with the patient face-to-face today.   More than 50% of time was dedicated to discussing treatment options, complications related to therapy and arranging for future plan of care.    Zola Button, MD 7/29/20209:05 AM

## 2019-04-08 ENCOUNTER — Telehealth: Payer: Self-pay

## 2019-04-08 LAB — PROSTATE-SPECIFIC AG, SERUM (LABCORP): Prostate Specific Ag, Serum: <0.1 ng/mL (ref 0.0–4.0)

## 2019-04-08 NOTE — Telephone Encounter (Signed)
-----   Message from Wyatt Portela, MD sent at 04/08/2019  8:00 AM EDT ----- Please let him know his PSA is low.

## 2019-04-08 NOTE — Telephone Encounter (Signed)
Contacted patient and made aware of PSA results. 

## 2019-04-12 MED FILL — TAMSULOSIN HCL 0.4 MG CAP: 0.4 | 90 days supply | Qty: 90 | Fill #0

## 2019-04-12 MED FILL — ABIRATERONE ACETATE 250 MG: 250 | 30 days supply | Qty: 120 | Fill #10

## 2019-04-12 MED FILL — AMLODIPINE BESYLATE 5 MG TA: 5 | 90 days supply | Qty: 90 | Fill #0

## 2019-04-14 DIAGNOSIS — M5117 Intervertebral disc disorders with radiculopathy, lumbosacral region: Secondary | ICD-10-CM | POA: Diagnosis not present

## 2019-04-14 DIAGNOSIS — I10 Essential (primary) hypertension: Secondary | ICD-10-CM | POA: Diagnosis not present

## 2019-05-06 MED FILL — ABIRATERONE ACETATE 250 MG: 250 | 30 days supply | Qty: 120 | Fill #11

## 2019-05-06 MED FILL — predniSONE 5 MG TABS: 5 | 30 days supply | Qty: 30 | Fill #0

## 2019-05-26 DIAGNOSIS — R69 Illness, unspecified: Secondary | ICD-10-CM | POA: Diagnosis not present

## 2019-05-26 DIAGNOSIS — Z23 Encounter for immunization: Secondary | ICD-10-CM | POA: Diagnosis not present

## 2019-06-01 ENCOUNTER — Other Ambulatory Visit: Payer: Self-pay | Admitting: Oncology

## 2019-06-01 DIAGNOSIS — C61 Malignant neoplasm of prostate: Secondary | ICD-10-CM

## 2019-06-03 MED FILL — MYRBETRIQ ER 25 MG TABLET: 25 | 90 days supply | Qty: 90 | Fill #1

## 2019-06-03 MED FILL — predniSONE 5 MG TABS: 5 | 30 days supply | Qty: 30 | Fill #1

## 2019-06-03 MED FILL — ABIRATERONE ACETATE 250 MG: 250 | 30 days supply | Qty: 120 | Fill #0

## 2019-06-09 ENCOUNTER — Inpatient Hospital Stay: Payer: Medicare HMO | Attending: Oncology | Admitting: Oncology

## 2019-06-09 ENCOUNTER — Other Ambulatory Visit: Payer: Self-pay

## 2019-06-09 ENCOUNTER — Inpatient Hospital Stay: Payer: Medicare HMO

## 2019-06-09 ENCOUNTER — Encounter: Payer: Self-pay | Admitting: Oncology

## 2019-06-09 VITALS — BP 164/81 | HR 81 | Temp 98.3°F | Resp 18 | Ht 67.0 in | Wt 252.4 lb

## 2019-06-09 DIAGNOSIS — C61 Malignant neoplasm of prostate: Secondary | ICD-10-CM | POA: Insufficient documentation

## 2019-06-09 DIAGNOSIS — E2740 Unspecified adrenocortical insufficiency: Secondary | ICD-10-CM | POA: Insufficient documentation

## 2019-06-09 DIAGNOSIS — Z79899 Other long term (current) drug therapy: Secondary | ICD-10-CM | POA: Diagnosis not present

## 2019-06-09 DIAGNOSIS — R590 Localized enlarged lymph nodes: Secondary | ICD-10-CM | POA: Insufficient documentation

## 2019-06-09 DIAGNOSIS — I10 Essential (primary) hypertension: Secondary | ICD-10-CM | POA: Diagnosis not present

## 2019-06-09 LAB — CMP (CANCER CENTER ONLY)
ALT: 17 U/L (ref 0–44)
AST: 13 U/L — ABNORMAL LOW (ref 15–41)
Albumin: 3.7 g/dL (ref 3.5–5.0)
Alkaline Phosphatase: 84 U/L (ref 38–126)
Anion gap: 9 (ref 5–15)
BUN: 18 mg/dL (ref 8–23)
CO2: 24 mmol/L (ref 22–32)
Calcium: 9.2 mg/dL (ref 8.9–10.3)
Chloride: 107 mmol/L (ref 98–111)
Creatinine: 1.23 mg/dL (ref 0.61–1.24)
GFR, Est AFR Am: >60 mL/min (ref >60)
GFR, Estimated: >60 mL/min (ref >60)
Glucose, Bld: 148 mg/dL — ABNORMAL HIGH (ref 70–99)
Potassium: 3.7 mmol/L (ref 3.5–5.1)
Sodium: 140 mmol/L (ref 135–145)
Total Bilirubin: 0.3 mg/dL (ref 0.3–1.2)
Total Protein: 6.3 g/dL — ABNORMAL LOW (ref 6.5–8.1)

## 2019-06-09 LAB — CBC WITH DIFFERENTIAL (CANCER CENTER ONLY)
Abs Immature Granulocytes: 0.02 10*3/uL (ref 0.00–0.07)
Basophils Absolute: 0.1 10*3/uL (ref 0.0–0.1)
Basophils Relative: 1 %
Eosinophils Absolute: 0.2 10*3/uL (ref 0.0–0.5)
Eosinophils Relative: 3 %
HCT: 41.5 % (ref 39.0–52.0)
Hemoglobin: 13.7 g/dL (ref 13.0–17.0)
Immature Granulocytes: 0 %
Lymphocytes Relative: 14 %
Lymphs Abs: 1.1 10*3/uL (ref 0.7–4.0)
MCH: 27.8 pg (ref 26.0–34.0)
MCHC: 33 g/dL (ref 30.0–36.0)
MCV: 84.3 fL (ref 80.0–100.0)
Monocytes Absolute: 0.6 10*3/uL (ref 0.1–1.0)
Monocytes Relative: 7 %
Neutro Abs: 5.7 10*3/uL (ref 1.7–7.7)
Neutrophils Relative %: 75 %
Platelet Count: 178 10*3/uL (ref 150–400)
RBC: 4.92 MIL/uL (ref 4.22–5.81)
RDW: 14.1 % (ref 11.5–15.5)
WBC Count: 7.6 10*3/uL (ref 4.0–10.5)
nRBC: 0 % (ref 0.0–0.2)

## 2019-06-09 MED ORDER — LEUPROLIDE ACETATE (4 MONTH) 30 MG ~~LOC~~ KIT
30.0000 mg | PACK | Freq: Once | SUBCUTANEOUS | Status: AC
  Administered 2019-06-09: 11:00:00 30 mg via SUBCUTANEOUS
  Filled 2019-06-09: qty 30

## 2019-06-09 NOTE — Patient Instructions (Signed)

## 2019-06-09 NOTE — Progress Notes (Signed)
Hematology and Oncology Follow Up Visit  Tydell Avalon [id number] 07-14-1951 68 y.o. 06/09/2019 9:44 AM [name], MDSouth, [name], MD   Principle Diagnosis: 68 year old man with advanced prostate cancer with the lymphadenopathy diagnosed in 2018.  He has castration-sensitive disease after presenting with Gleason score 4+5 = 9 and PSA of 14.7 at the time of diagnosis.   Prior Therapy: He is status post prostate biopsy in August 2018.  Current therapy:  Androgen deprivation therapy under the care of Dr. Jeffie Pollock started in September 2018.  He is currently receiving Lupron 30 mg every 4 months started on Jan 29, 2019.  Zytiga 1000 mg daily with prednisone 5 mg daily started in October 2018.  Interim History: [name] is here for a follow-up visit.  Since the last visit, he reports no major changes in his health.  He continues to tolerate Zytiga without any major complaints.  He does report some fatigue and some tiredness but manageable for the most part.  He denies any recent hospitalizations or illnesses.  He denies any bone pain or pathological fractures.  He reports his appetite remain excellent and gained more weight.  He denied headaches, blurry vision, syncope or seizures.  Denies any fevers, chills or sweats.  Denied chest pain, palpitation, orthopnea or leg edema.  Denied cough, wheezing or hemoptysis.  Denied nausea, vomiting or abdominal pain.  Denies any constipation or diarrhea.  Denies any frequency urgency or hesitancy.  Denies any arthralgias or myalgias.  Denies any skin rashes or lesions.  Denies any bleeding or clotting tendency.  Denies any easy bruising.  Denies any hair or nail changes.  Denies any anxiety or depression.  Remaining review of system is negative.        Medications: Updated on review. Current Outpatient Medications  Medication Sig Dispense Refill  . abiraterone acetate (ZYTIGA) 250 MG tablet TAKE 4 TABLETS (1,000 MG TOTAL) BY MOUTH DAILY. TAKE ON AN  EMPTY STOMACH, 1 HOUR BEFORE OR 2 HOURS AFTER A MEAL. 120 tablet 11  . amLODipine (NORVASC) 5 MG tablet Take 5 mg by mouth daily.    . predniSONE (DELTASONE) 5 MG tablet TAKE 1 TABLET BY MOUTH DAILY WITH BREAKFAST. 30 tablet 2  . tamsulosin (FLOMAX) 0.4 MG CAPS capsule Take 1 capsule (0.4 mg total) by mouth daily after supper. 30 capsule 0   No current facility-administered medications for this visit.      Allergies: No Known Allergies  Past Medical History, Surgical history, Social history, and Family History without any changes on review..    Physical Exam: Blood pressure (!) 164/81, pulse 81, temperature 98.3 F (36.8 C), temperature source Oral, resp. rate 18, height 5\' 7"  (1.702 m), weight 252 lb 6.4 oz (114.5 kg), SpO2 98 %.    ECOG: 1     General appearance: Comfortable appearing without any discomfort Head: Normocephalic without any trauma Oropharynx: Mucous membranes are moist and pink without any thrush or ulcers. Eyes: Pupils are equal and round reactive to light. Lymph nodes: No cervical, supraclavicular, inguinal or axillary lymphadenopathy.   Heart:regular rate and rhythm.  S1 and S2 without leg edema. Lung: Clear without any rhonchi or wheezes.  No dullness to percussion. Abdomin: Soft, nontender, nondistended with good bowel sounds.  No hepatosplenomegaly. Musculoskeletal: No joint deformity or effusion.  Full range of motion noted. Neurological: No deficits noted on motor, sensory and deep tendon reflex exam. Skin: No petechial rash or dryness.  Appeared moist.  Psychiatric: Mood and affect appeared appropriate.  Lab Results: Lab Results  Component Value Date   WBC 5.8 04/07/2019   HGB 13.3 04/07/2019   HCT 40.4 04/07/2019   MCV 84.3 04/07/2019   PLT 190 04/07/2019     Chemistry      Component Value Date/Time   NA 140 04/07/2019 0840   NA 142 09/10/2017 1509   K 3.8 04/07/2019 0840   K 4.2 09/10/2017 1509   CL 108 04/07/2019 0840    CO2 23 04/07/2019 0840   CO2 25 09/10/2017 1509   BUN 15 04/07/2019 0840   BUN 19.4 09/10/2017 1509   CREATININE 0.92 04/07/2019 0840   CREATININE 1.2 09/10/2017 1509      Component Value Date/Time   CALCIUM 9.2 04/07/2019 0840   CALCIUM 9.7 09/10/2017 1509   ALKPHOS 84 04/07/2019 0840   ALKPHOS 84 09/10/2017 1509   AST 13 (L) 04/07/2019 0840   AST 14 09/10/2017 1509   ALT 13 04/07/2019 0840   ALT 18 09/10/2017 1509   BILITOT 0.5 04/07/2019 0840   BILITOT 0.38 09/10/2017 1509         Results for ALIJHA, RUMMEL (MRN [id number]) as of 06/09/2019 09:46  Ref. Range 01/22/2019 09:07 04/07/2019 08:40  Prostate Specific Ag, Serum Latest Ref Range: 0.0 - 4.0 ng/mL <0.1 <0.1         Impression and Plan:  68 year old man with:  1.  Prostate cancer diagnosed in 2018.  He was found to have castration-sensitive disease with pelvic adenopathy.  He is currently on Zytiga with excellent PSA response.  He continues to have undetectable disease at this time.  The natural course of this disease as well as long-term complication associated with Zytiga were reiterated.  He is complication quitting hypertension, hyperglycemia, adrenal insufficiency among others.  Alternative options were also reiterated and at this time he opted to continue with the same dose and schedule.  2.  Androgen deprivation therapy: The long-term complications associated with androgen deprivation were reviewed.  These would include hot flashes, weight gain and osteoporosis.  He will receive Eligard today and repeated in 4 months.  3.  Hypertension: We will continue to monitor his blood pressure on Zytiga.  His blood pressure is mildly elevated today although close to normal range between visits.  4.  Electrolyte and liver function test monitoring: These have been within normal range at this time will continue to be monitored on his current therapy.  5.  Prognosis and goals of care: His disease is incurable  although aggressive measures are warranted given his excellent performance status.  6.  Follow-up: He will return in 4 months for repeat evaluation and next injection.  25  minutes was spent with the patient face-to-face today.  More than 50% of time was spent on reviewing his disease status, treatment options and answering questions regarding future plan of care.    Zola Button, MD 9/30/20209:44 AM

## 2019-06-10 ENCOUNTER — Telehealth: Payer: Self-pay

## 2019-06-10 LAB — PROSTATE-SPECIFIC AG, SERUM (LABCORP): Prostate Specific Ag, Serum: <0.1 ng/mL (ref 0.0–4.0)

## 2019-06-10 NOTE — Telephone Encounter (Signed)
-----   Message from Wyatt Portela, MD sent at 06/10/2019  8:21 AM EDT ----- Please let him know his PSA is low.

## 2019-06-10 NOTE — Telephone Encounter (Signed)
Called patient and made him aware of PSA results. Verbalized understanding.  

## 2019-06-11 ENCOUNTER — Telehealth: Payer: Self-pay | Admitting: Oncology

## 2019-06-11 NOTE — Telephone Encounter (Signed)
Called and spoke with patients wife. confirmed appts

## 2019-06-22 DIAGNOSIS — D485 Neoplasm of uncertain behavior of skin: Secondary | ICD-10-CM | POA: Diagnosis not present

## 2019-06-22 DIAGNOSIS — L245 Irritant contact dermatitis due to other chemical products: Secondary | ICD-10-CM | POA: Diagnosis not present

## 2019-06-22 DIAGNOSIS — D225 Melanocytic nevi of trunk: Secondary | ICD-10-CM | POA: Diagnosis not present

## 2019-06-22 DIAGNOSIS — B078 Other viral warts: Secondary | ICD-10-CM | POA: Diagnosis not present

## 2019-07-02 MED FILL — ABIRATERONE ACETATE 250 MG: 250 | 30 days supply | Qty: 120 | Fill #1

## 2019-07-02 MED FILL — TAMSULOSIN HCL 0.4 MG CAP: 0.4 | 90 days supply | Qty: 90 | Fill #1

## 2019-07-22 DIAGNOSIS — M5117 Intervertebral disc disorders with radiculopathy, lumbosacral region: Secondary | ICD-10-CM | POA: Diagnosis not present

## 2019-07-27 DIAGNOSIS — R262 Difficulty in walking, not elsewhere classified: Secondary | ICD-10-CM | POA: Diagnosis not present

## 2019-07-27 DIAGNOSIS — Z7409 Other reduced mobility: Secondary | ICD-10-CM | POA: Diagnosis not present

## 2019-07-27 DIAGNOSIS — M6281 Muscle weakness (generalized): Secondary | ICD-10-CM | POA: Diagnosis not present

## 2019-07-27 DIAGNOSIS — M545 Low back pain: Secondary | ICD-10-CM | POA: Diagnosis not present

## 2019-07-28 ENCOUNTER — Other Ambulatory Visit: Payer: Self-pay | Admitting: Oncology

## 2019-07-28 DIAGNOSIS — C61 Malignant neoplasm of prostate: Secondary | ICD-10-CM

## 2019-07-28 MED FILL — ABIRATERONE ACETATE 250 MG: 250 | 30 days supply | Qty: 120 | Fill #2

## 2019-07-28 MED FILL — predniSONE 5 MG TABS: 5 | 30 days supply | Qty: 30 | Fill #0

## 2019-08-03 DIAGNOSIS — L259 Unspecified contact dermatitis, unspecified cause: Secondary | ICD-10-CM | POA: Diagnosis not present

## 2019-08-03 DIAGNOSIS — B351 Tinea unguium: Secondary | ICD-10-CM | POA: Diagnosis not present

## 2019-08-03 DIAGNOSIS — B353 Tinea pedis: Secondary | ICD-10-CM | POA: Diagnosis not present

## 2019-08-03 DIAGNOSIS — L602 Onychogryphosis: Secondary | ICD-10-CM | POA: Diagnosis not present

## 2019-08-04 DIAGNOSIS — R262 Difficulty in walking, not elsewhere classified: Secondary | ICD-10-CM | POA: Diagnosis not present

## 2019-08-04 DIAGNOSIS — Z7409 Other reduced mobility: Secondary | ICD-10-CM | POA: Diagnosis not present

## 2019-08-04 DIAGNOSIS — M545 Low back pain: Secondary | ICD-10-CM | POA: Diagnosis not present

## 2019-08-04 DIAGNOSIS — M6281 Muscle weakness (generalized): Secondary | ICD-10-CM | POA: Diagnosis not present

## 2019-08-09 DIAGNOSIS — Z20818 Contact with and (suspected) exposure to other bacterial communicable diseases: Secondary | ICD-10-CM | POA: Diagnosis not present

## 2019-08-09 DIAGNOSIS — R05 Cough: Secondary | ICD-10-CM | POA: Diagnosis not present

## 2019-08-09 DIAGNOSIS — Z7689 Persons encountering health services in other specified circumstances: Secondary | ICD-10-CM | POA: Diagnosis not present

## 2019-08-09 DIAGNOSIS — R079 Chest pain, unspecified: Secondary | ICD-10-CM | POA: Diagnosis not present

## 2019-08-09 DIAGNOSIS — J181 Lobar pneumonia, unspecified organism: Secondary | ICD-10-CM | POA: Diagnosis not present

## 2019-08-09 DIAGNOSIS — C61 Malignant neoplasm of prostate: Secondary | ICD-10-CM | POA: Diagnosis not present

## 2019-08-11 DIAGNOSIS — L259 Unspecified contact dermatitis, unspecified cause: Secondary | ICD-10-CM | POA: Diagnosis not present

## 2019-08-11 DIAGNOSIS — B351 Tinea unguium: Secondary | ICD-10-CM | POA: Diagnosis not present

## 2019-08-16 DIAGNOSIS — R079 Chest pain, unspecified: Secondary | ICD-10-CM | POA: Diagnosis not present

## 2019-08-16 DIAGNOSIS — Z20818 Contact with and (suspected) exposure to other bacterial communicable diseases: Secondary | ICD-10-CM | POA: Diagnosis not present

## 2019-08-16 DIAGNOSIS — J181 Lobar pneumonia, unspecified organism: Secondary | ICD-10-CM | POA: Diagnosis not present

## 2019-08-24 DIAGNOSIS — L309 Dermatitis, unspecified: Secondary | ICD-10-CM | POA: Diagnosis not present

## 2019-08-24 DIAGNOSIS — B351 Tinea unguium: Secondary | ICD-10-CM | POA: Diagnosis not present

## 2019-08-26 MED FILL — ABIRATERONE ACETATE 250 MG: 250 | 30 days supply | Qty: 120 | Fill #3

## 2019-08-26 MED FILL — predniSONE 5 MG TABS: 5 | 30 days supply | Qty: 30 | Fill #1

## 2019-09-01 DIAGNOSIS — I10 Essential (primary) hypertension: Secondary | ICD-10-CM | POA: Diagnosis not present

## 2019-09-01 DIAGNOSIS — J209 Acute bronchitis, unspecified: Secondary | ICD-10-CM | POA: Diagnosis not present

## 2019-09-01 DIAGNOSIS — R05 Cough: Secondary | ICD-10-CM | POA: Diagnosis not present

## 2019-09-01 DIAGNOSIS — R69 Illness, unspecified: Secondary | ICD-10-CM | POA: Diagnosis not present

## 2019-09-01 DIAGNOSIS — Z20818 Contact with and (suspected) exposure to other bacterial communicable diseases: Secondary | ICD-10-CM | POA: Diagnosis not present

## 2019-09-01 DIAGNOSIS — J069 Acute upper respiratory infection, unspecified: Secondary | ICD-10-CM | POA: Diagnosis not present

## 2019-09-02 ENCOUNTER — Emergency Department (HOSPITAL_COMMUNITY)
Admission: EM | Admit: 2019-09-02 | Discharge: 2019-09-02 | Disposition: A | Discharge status: Home / Self Care | Payer: Medicare HMO | Attending: Emergency Medicine | Admitting: Emergency Medicine

## 2019-09-02 ENCOUNTER — Emergency Department (HOSPITAL_COMMUNITY): Payer: Medicare HMO

## 2019-09-02 ENCOUNTER — Encounter (HOSPITAL_COMMUNITY): Payer: Self-pay

## 2019-09-02 ENCOUNTER — Other Ambulatory Visit: Payer: Self-pay

## 2019-09-02 DIAGNOSIS — R0602 Shortness of breath: Secondary | ICD-10-CM

## 2019-09-02 DIAGNOSIS — R062 Wheezing: Secondary | ICD-10-CM | POA: Diagnosis not present

## 2019-09-02 DIAGNOSIS — Z79899 Other long term (current) drug therapy: Secondary | ICD-10-CM | POA: Insufficient documentation

## 2019-09-02 DIAGNOSIS — Z87891 Personal history of nicotine dependence: Secondary | ICD-10-CM | POA: Diagnosis not present

## 2019-09-02 HISTORY — DX: Pneumonia, unspecified organism: J18.9

## 2019-09-02 HISTORY — DX: Bronchitis, not specified as acute or chronic: J40

## 2019-09-02 LAB — CBC WITH DIFFERENTIAL/PLATELET
Abs Immature Granulocytes: 0.07 10*3/uL (ref 0.00–0.07)
Basophils Absolute: 0.1 10*3/uL (ref 0.0–0.1)
Basophils Relative: 0 %
Eosinophils Absolute: 0.1 10*3/uL (ref 0.0–0.5)
Eosinophils Relative: 1 %
HCT: 45.2 % (ref 39.0–52.0)
Hemoglobin: 14.8 g/dL (ref 13.0–17.0)
Immature Granulocytes: 0 %
Lymphocytes Relative: 4 %
Lymphs Abs: 0.6 10*3/uL — ABNORMAL LOW (ref 0.7–4.0)
MCH: 28.4 pg (ref 26.0–34.0)
MCHC: 32.7 g/dL (ref 30.0–36.0)
MCV: 86.6 fL (ref 80.0–100.0)
Monocytes Absolute: 0.3 10*3/uL (ref 0.1–1.0)
Monocytes Relative: 2 %
Neutro Abs: 14.4 10*3/uL — ABNORMAL HIGH (ref 1.7–7.7)
Neutrophils Relative %: 93 %
Platelets: 245 10*3/uL (ref 150–400)
RBC: 5.22 MIL/uL (ref 4.22–5.81)
RDW: 14.7 % (ref 11.5–15.5)
WBC: 15.6 10*3/uL — ABNORMAL HIGH (ref 4.0–10.5)
nRBC: 0 % (ref 0.0–0.2)

## 2019-09-02 LAB — COMPREHENSIVE METABOLIC PANEL
ALT: 19 U/L (ref 0–44)
AST: 20 U/L (ref 15–41)
Albumin: 4.1 g/dL (ref 3.5–5.0)
Alkaline Phosphatase: 91 U/L (ref 38–126)
Anion gap: 10 (ref 5–15)
BUN: 21 mg/dL (ref 8–23)
CO2: 25 mmol/L (ref 22–32)
Calcium: 9.6 mg/dL (ref 8.9–10.3)
Chloride: 106 mmol/L (ref 98–111)
Creatinine, Ser: 1.14 mg/dL (ref 0.61–1.24)
GFR calc Af Amer: >60 mL/min (ref >60)
GFR calc non Af Amer: >60 mL/min (ref >60)
Glucose, Bld: 156 mg/dL — ABNORMAL HIGH (ref 70–99)
Potassium: 4 mmol/L (ref 3.5–5.1)
Sodium: 141 mmol/L (ref 135–145)
Total Bilirubin: 0.6 mg/dL (ref 0.3–1.2)
Total Protein: 7.2 g/dL (ref 6.5–8.1)

## 2019-09-02 LAB — BRAIN NATRIURETIC PEPTIDE: B Natriuretic Peptide: 93.8 pg/mL (ref 0.0–100.0)

## 2019-09-02 MED ORDER — IOHEXOL 350 MG/ML SOLN
100.0000 mL | Freq: Once | INTRAVENOUS | Status: AC | PRN
  Administered 2019-09-02: 100 mL via INTRAVENOUS

## 2019-09-02 MED ORDER — MAGNESIUM SULFATE 50 % IJ SOLN: 2.0000 g | Freq: Once | INTRAMUSCULAR | Status: DC

## 2019-09-02 MED ORDER — MAGNESIUM SULFATE 2 GM/50ML IV SOLN
2.0000 g | Freq: Once | INTRAVENOUS | Status: AC
  Administered 2019-09-02: 2 g via INTRAVENOUS
  Filled 2019-09-02: qty 50

## 2019-09-02 MED ORDER — METHYLPREDNISOLONE SODIUM SUCC 125 MG IJ SOLR
125.0000 mg | Freq: Once | INTRAMUSCULAR | Status: AC
  Administered 2019-09-02: 125 mg via INTRAVENOUS
  Filled 2019-09-02: qty 2

## 2019-09-02 MED ORDER — ALBUTEROL SULFATE HFA 108 (90 BASE) MCG/ACT IN AERS
8.0000 | INHALATION_SPRAY | Freq: Once | RESPIRATORY_TRACT | Status: AC
  Administered 2019-09-02: 17:00:00 8 via RESPIRATORY_TRACT
  Filled 2019-09-02: qty 6.7

## 2019-09-02 MED ORDER — ALBUTEROL SULFATE HFA 108 (90 BASE) MCG/ACT IN AERS
8.0000 | INHALATION_SPRAY | Freq: Once | RESPIRATORY_TRACT | Status: AC
  Administered 2019-09-02: 8 via RESPIRATORY_TRACT

## 2019-09-02 MED ORDER — MAGNESIUM SULFATE 50 % IJ SOLN: 1.0000 g | Freq: Once | INTRAMUSCULAR | Status: DC

## 2019-09-02 NOTE — ED Provider Notes (Signed)
[facility] DEPT Provider Note   CSN: [id number] Arrival date & time: 09/02/19  1503     History Chief Complaint  Patient presents with  . Shortness of Breath    Bobby Moga. is a 68 y.o. male presenting for evaluation of shortness of breath.  Patient states 2 weeks ago Bobby [name] developed shortness of breath and chest pain.  Bobby [name] was found to have pneumonia, this was treated with antibiotics.  His chest pain has resolved.  Bobby [name] had a repeat check yesterday, x-ray was clear.  Bobby [name] was found to have wheezing and mild shortness of breath, was started on prednisone, Z-Pak, and albuterol for presumed bronchitis.  Patient states today Bobby [name] has had worsening shortness of breath which began acutely this afternoon.  Bobby [name] denies fevers, chills, sore throat, chest pain, nausea, vomiting, dumping, urinary symptoms, abnormal bowel movements.  Bobby [name] has a history of prostate cancer for which Bobby [name] takes [place].  Bobby [name] is not on blood thinners.  No leg pain or swelling.  Patient states Bobby [name] was tested for Covid when Bobby [name] first developed pneumonia which was negative, retested yesterday which was negative.  Bobby [name] denies history of asthma or COPD.  Bobby [name] denies tobacco use.  Shortness of breath is worse when Bobby [name] lays flat and with exertion.  No history of heart failure, is not on Lasix.  HPI     Past Medical History:  Diagnosis Date  . Bronchitis   . Family history of breast cancer   . Family history of kidney cancer   . Family history of pancreatic cancer   . History of kidney stones   . Pneumonia   . Prostate cancer [facility])     Patient Active Problem List   Diagnosis Date Noted  . Genetic testing 09/10/2018  . Family history of breast cancer   . Family history of pancreatic cancer   . Family history of kidney cancer   . Malignant neoplasm of prostate ([place]) 05/22/2017  . OM (otitis media), acute 09/08/2012  . Sinusitis 09/08/2012  . Conjunctivitis 09/08/2012  . GASTROENTERITIS 11/10/2009  .  DIVERTICULOSIS, COLON 11/10/2009  . NEPHROLITHIASIS, HX OF 11/10/2009  . 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 ABDOMINAL PAIN, LEFT LOWER QUADRANT 04/28/2007    Past Surgical History:  Procedure Laterality Date  . EXTRACORPOREAL SHOCK WAVE LITHOTRIPSY Right 04/17/2017   Procedure: RIGHT EXTRACORPOREAL SHOCK WAVE LITHOTRIPSY (ESWL);  Surgeon: Kathie Rhodes, MD;  Location: [facility] ORS;  Service: Urology;  Laterality: Right;  . HERNIA REPAIR     age 53  . PROSTATE BIOPSY         Family History  Problem Relation Age of Onset  . Breast cancer Sister 79       "negative genetic testing"  . Pancreatic cancer Brother 10  . Breast cancer Maternal Aunt   . Kidney cancer Maternal Aunt        dx 42s    Social History   Tobacco Use  . Smoking status: Former Smoker    Packs/day: 0.25    Years: 10.00    Pack years: 2.50    Types: Cigarettes  . Smokeless tobacco: Never Used  . Tobacco comment: pt states is currently trying to stop   Substance Use Topics  . Alcohol use: No  . Drug use: No    Home Medications Prior to Admission medications   Medication Sig Start Date End Date Taking? Authorizing Provider  abiraterone acetate (ZYTIGA) 250 MG tablet TAKE 4 TABLETS (1,000 MG TOTAL) BY MOUTH  DAILY. TAKE ON AN EMPTY STOMACH, 1 HOUR BEFORE OR 2 HOURS AFTER A MEAL. 06/01/19  Yes Wyatt Portela, MD  albuterol (PROVENTIL) (2.5 MG/3ML) 0.083% nebulizer solution Inhale 2.5 mLs into the lungs 4 (four) times daily as needed for wheezing or shortness of breath. 09/01/19  Yes [provider]  albuterol (VENTOLIN HFA) 108 (90 Base) MCG/ACT inhaler Inhale 2 puffs into the lungs every 4 (four) hours as needed for wheezing or shortness of breath. 08/16/19  Yes [provider]  amLODipine (NORVASC) 5 MG tablet Take 5 mg by mouth daily.   Yes [provider]  azithromycin (ZITHROMAX) 250 MG tablet Take 250 mg by mouth See admin instructions. Take 500mg  on day 1 then 250mg  for 4 days.   Yes  [provider]  MYRBETRIQ 25 MG TB24 tablet Take 25 mg by mouth daily. 06/03/19  Yes [provider]  predniSONE (DELTASONE) 5 MG tablet Take 5 mg by mouth daily with breakfast.   Yes [provider]  predniSONE (STERAPRED UNI-PAK 21 TAB) 10 MG (21) TBPK tablet Take 1 tablet by mouth taper from 4 doses each day to 1 dose and stop. 09/01/19  Yes [provider]  tamsulosin (FLOMAX) 0.4 MG CAPS capsule Take 1 capsule (0.4 mg total) by mouth daily after supper. 04/17/17  Yes Kathie Rhodes, MD    Allergies    Patient has no known allergies.  Review of Systems   Review of Systems  Respiratory: Positive for shortness of breath.   All other systems reviewed and are negative.   Physical Exam Updated Vital Signs BP (!) 168/103   Pulse (!) 105   Temp 97.8 F (36.6 C) (Oral)   Resp 20   Ht 5\' 6"  (1.676 m)   Wt 111.1 kg   SpO2 94%   BMI 39.54 kg/m   Physical Exam Vitals and nursing note reviewed.  Constitutional:      General: Bobby [name] is not in acute distress.    Appearance: Bobby [name] is well-developed.     Comments: Sitting on the bed in no acute distress  HENT:     Head: Normocephalic and atraumatic.  Eyes:     Extraocular Movements: Extraocular movements intact.     Conjunctiva/sclera: Conjunctivae normal.     Pupils: Pupils are equal, round, and reactive to light.  Cardiovascular:     Rate and Rhythm: Normal rate and regular rhythm.     Pulses: Normal pulses.  Pulmonary:     Effort: Pulmonary effort is normal. No respiratory distress.     Breath sounds: Wheezing present.     Comments: Expiratory wheezes in all fields.  Speaking in full sentences.  Patient is 97% on oxygen which was placed for comfort, not hypoxia.  Bobby [name] feels better on oxygen.  No signs of respiratory distress Abdominal:     General: There is no distension.     Palpations: Abdomen is soft. There is no mass.     Tenderness: There is no abdominal tenderness. There is no guarding or  rebound.  Musculoskeletal:        General: Normal range of motion.     Cervical back: Normal range of motion and neck supple.     Right lower leg: Edema present.     Left lower leg: Edema present.  Skin:    General: Skin is warm and dry.     Capillary Refill: Capillary refill takes less than 2 seconds.  Neurological:     Mental Status: Bobby [name]  is alert and oriented to person, place, and time.     ED Results / Procedures / Treatments   Labs (all labs ordered are listed, but only abnormal results are displayed) Labs Reviewed  CBC WITH DIFFERENTIAL/PLATELET - Abnormal; Notable for the following components:      Result Value   WBC 15.6 (*)    Neutro Abs 14.4 (*)    Lymphs Abs 0.6 (*)    All other components within normal limits  COMPREHENSIVE METABOLIC PANEL - Abnormal; Notable for the following components:   Glucose, Bld 156 (*)    All other components within normal limits  BRAIN NATRIURETIC PEPTIDE    EKG None  Radiology CT Angio Chest PE W and/or Wo Contrast  Result Date: 09/02/2019 CLINICAL DATA:  68 year old male with shortness of breath. EXAM: CT ANGIOGRAPHY CHEST WITH CONTRAST TECHNIQUE: Multidetector CT imaging of the chest was performed using the standard protocol during bolus administration of intravenous contrast. Multiplanar CT image reconstructions and MIPs were obtained to evaluate the vascular anatomy. CONTRAST:  143mL OMNIPAQUE IOHEXOL 350 MG/ML SOLN COMPARISON:  Chest radiograph dated 09/02/2019 FINDINGS: Evaluation of this exam is limited due to respiratory motion artifact. Cardiovascular: There is mild cardiomegaly. No pericardial effusion. There is multi vessel coronary vascular calcification. There is mild atherosclerotic calcification of the thoracic aorta. No aneurysmal dilatation or dissection. The origins of the great vessels of the aortic arch appear patent. Evaluation of the pulmonary arteries is somewhat limited due to respiratory motion artifact. No large or  central pulmonary artery embolus identified. Mediastinum/Nodes: There is no hilar or mediastinal adenopathy. The esophagus and the thyroid gland are grossly unremarkable. No mediastinal fluid collection. Lungs/Pleura: The lungs are clear. There is no pleural effusion or pneumothorax. The central airways are patent. Upper Abdomen: Several small hepatic hypodense lesions measuring up to 14 mm are suboptimally characterized but demonstrate fluid attenuation, likely cysts. The visualized upper abdomen is otherwise unremarkable. Musculoskeletal: Mild degenerative changes of the spine. No acute osseous pathology. Review of the MIP images confirms the above findings. IMPRESSION: No acute intrathoracic pathology. No CT evidence of central pulmonary artery embolus. Electronically Signed   By: Anner Crete M.D.   On: 09/02/2019 19:34   DG Chest Port 1 View  Result Date: 09/02/2019 CLINICAL DATA:  Shortness of breath and weakness. EXAM: PORTABLE CHEST 1 VIEW COMPARISON:  None. FINDINGS: 1635 hours. The lungs are clear without focal pneumonia, edema, pneumothorax or pleural effusion. Interstitial markings are diffusely coarsened with chronic features. Cardiopericardial silhouette is at upper limits of normal for size. The visualized bony structures of the thorax are intact. IMPRESSION: No active disease. Electronically Signed   By: Misty Stanley M.D.   On: 09/02/2019 17:28    Procedures Procedures (including critical care time)  Medications Ordered in ED Medications  albuterol (VENTOLIN HFA) 108 (90 Base) MCG/ACT inhaler 8 puff (8 puffs Inhalation Given 09/02/19 1634)  methylPREDNISolone sodium succinate (SOLU-MEDROL) 125 mg/2 mL injection 125 mg (125 mg Intravenous Given 09/02/19 1634)  magnesium sulfate IVPB 2 g 50 mL (0 g Intravenous Stopped 09/02/19 1738)  albuterol (VENTOLIN HFA) 108 (90 Base) MCG/ACT inhaler 8 puff (8 puffs Inhalation Given 09/02/19 1855)  iohexol (OMNIPAQUE) 350 MG/ML injection 100  mL (100 mLs Intravenous Contrast Given 09/02/19 1906)    ED Course  I have reviewed the triage vital signs and the nursing notes.  Pertinent labs & imaging results that were available during my care of the patient were reviewed by me and considered  in my medical decision making (see chart for details).    MDM Rules/Calculators/A&P                      Patient presenting for evaluation of shortness of breath.  Physical exam shows patient who appears nontoxic.  No signs of respiratory distress.  Bobby [name] does have expiratory wheezing in all fields.  Additionally, patient felt so short of breath needing oxygen, and this improved his symptoms.  Consider bronchitis.  Consider recurrence of pneumonia.  Consider viral illness such as Covid.  Consider PE due to history of cancer.  Will obtain labs and x-ray.  Will give albuterol inhaler, mag, solumedrol and reassess.  Labs show mild leukocytosis, likely due to prednisone use.  X-ray viewed interpreted by me, no pneumonia, failure, effusion.  BNP negative.  No signs of effusion or leg swelling, doubt heart failure.  As x-ray is normal, will obtain CTA for further evaluation.  On reassessment, patient reports mild improvement with albuterol.  Bobby [name] is still having significant expiratory wheezes in all fields.  CTA negative for PE or other acute findings.  On reassessment after repeat albuterol dosing, patient reports improvement of breathing.  Bobby [name] is now off oxygen and feeling well.  Bobby [name] ambulated without difficulty and sats remained stable.  Discussed continued symptomatic treatment at home and close follow-up with primary care.  At this time, patient appears safe for discharge.  Return precautions given.  Patient states Bobby [name] understands and agrees to plan.  Final Clinical Impression(s) / ED Diagnoses Final diagnoses:  SOB (shortness of breath)  Wheezing    Rx / DC Orders ED Discharge Orders    None       Franchot Heidelberg, PA-C 09/02/19 2204      Davonna Belling, MD 09/02/19 [phone]

## 2019-09-02 NOTE — Discharge Instructions (Addendum)
Continue taking medications as prescribed. It is important that you use albuterol every 4 hours.  You may do this either as a nebulizer or as 8 puffs of the inhaler. Do this for the next 2 days.  Afterwards, use the albuterol only as needed for shortness of breath. Continue taking prednisone as prescribed. Follow-up closely with your primary care doctor for reevaluation of your symptoms. Return to the emergency room if you develop increased shortness of breath, chest pain, any new, worsening, or concerning symptoms.

## 2019-09-02 NOTE — ED Notes (Signed)
Pt placed on 2 L/M via Nasal Cannula.

## 2019-09-02 NOTE — ED Triage Notes (Signed)
Patient states he was diagnosed with pneumonia 2 weeks ago. Patient went back yesterday for recheck and patient was having expiratory wheezing and increased SOB. Patient had a negative Covid test and was started on Prednisone taper, neb with Albuterol and Z-pack. Patient states he was told he had bronchitis after having x-rays and that pneumonia was resolved. Patient states SOB is worse today.

## 2019-09-02 NOTE — ED Notes (Signed)
Pt ambulated in room and oxygen levels maintained at 93% or above through the duration.

## 2019-09-15 DIAGNOSIS — J069 Acute upper respiratory infection, unspecified: Secondary | ICD-10-CM | POA: Diagnosis not present

## 2019-09-15 DIAGNOSIS — J209 Acute bronchitis, unspecified: Secondary | ICD-10-CM | POA: Diagnosis not present

## 2019-09-15 DIAGNOSIS — R05 Cough: Secondary | ICD-10-CM | POA: Diagnosis not present

## 2019-09-23 DIAGNOSIS — J209 Acute bronchitis, unspecified: Secondary | ICD-10-CM | POA: Diagnosis not present

## 2019-09-23 DIAGNOSIS — N200 Calculus of kidney: Secondary | ICD-10-CM | POA: Diagnosis not present

## 2019-09-23 DIAGNOSIS — E669 Obesity, unspecified: Secondary | ICD-10-CM | POA: Diagnosis not present

## 2019-09-23 DIAGNOSIS — R7309 Other abnormal glucose: Secondary | ICD-10-CM | POA: Diagnosis not present

## 2019-09-23 DIAGNOSIS — I10 Essential (primary) hypertension: Secondary | ICD-10-CM | POA: Diagnosis not present

## 2019-09-23 DIAGNOSIS — C61 Malignant neoplasm of prostate: Secondary | ICD-10-CM | POA: Diagnosis not present

## 2019-09-23 MED FILL — AMLODIPINE BESYLATE 5 MG TA: 5 | 90 days supply | Qty: 90 | Fill #1

## 2019-09-23 MED FILL — ABIRATERONE ACETATE 250 MG: 250 | 30 days supply | Qty: 120 | Fill #4

## 2019-09-23 MED FILL — predniSONE 5 MG TABS: 5 | 30 days supply | Qty: 30 | Fill #2

## 2019-10-04 DIAGNOSIS — R69 Illness, unspecified: Secondary | ICD-10-CM | POA: Diagnosis not present

## 2019-10-04 MED FILL — MYRBETRIQ ER 25 MG TABLET: 25 | 90 days supply | Qty: 90 | Fill #2

## 2019-10-06 MED FILL — TAMSULOSIN HCL 0.4 MG CAP: 0.4 | 90 days supply | Qty: 90 | Fill #0

## 2019-10-07 DIAGNOSIS — R69 Illness, unspecified: Secondary | ICD-10-CM | POA: Diagnosis not present

## 2019-10-10 ENCOUNTER — Ambulatory Visit: Payer: Medicare HMO

## 2019-10-12 ENCOUNTER — Inpatient Hospital Stay: Payer: Medicare HMO | Attending: Oncology | Admitting: Oncology

## 2019-10-12 ENCOUNTER — Telehealth: Payer: Self-pay | Admitting: Oncology

## 2019-10-12 ENCOUNTER — Inpatient Hospital Stay: Payer: Medicare HMO

## 2019-10-12 ENCOUNTER — Other Ambulatory Visit: Payer: Self-pay

## 2019-10-12 VITALS — BP 166/84 | HR 80 | Temp 97.8°F | Resp 17 | Ht 66.0 in | Wt 256.8 lb

## 2019-10-12 DIAGNOSIS — C61 Malignant neoplasm of prostate: Secondary | ICD-10-CM | POA: Diagnosis not present

## 2019-10-12 DIAGNOSIS — Z5111 Encounter for antineoplastic chemotherapy: Secondary | ICD-10-CM | POA: Diagnosis present

## 2019-10-12 LAB — CBC WITH DIFFERENTIAL (CANCER CENTER ONLY)
Abs Immature Granulocytes: 0.03 10*3/uL (ref 0.00–0.07)
Basophils Absolute: 0.1 10*3/uL (ref 0.0–0.1)
Basophils Relative: 1 %
Eosinophils Absolute: 0.4 10*3/uL (ref 0.0–0.5)
Eosinophils Relative: 6 %
HCT: 43.3 % (ref 39.0–52.0)
Hemoglobin: 14.3 g/dL (ref 13.0–17.0)
Immature Granulocytes: 0 %
Lymphocytes Relative: 17 %
Lymphs Abs: 1.2 10*3/uL (ref 0.7–4.0)
MCH: 28 pg (ref 26.0–34.0)
MCHC: 33 g/dL (ref 30.0–36.0)
MCV: 84.9 fL (ref 80.0–100.0)
Monocytes Absolute: 0.6 10*3/uL (ref 0.1–1.0)
Monocytes Relative: 8 %
Neutro Abs: 4.7 10*3/uL (ref 1.7–7.7)
Neutrophils Relative %: 68 %
Platelet Count: 234 10*3/uL (ref 150–400)
RBC: 5.1 MIL/uL (ref 4.22–5.81)
RDW: 14.6 % (ref 11.5–15.5)
WBC Count: 7.1 10*3/uL (ref 4.0–10.5)
nRBC: 0 % (ref 0.0–0.2)

## 2019-10-12 LAB — CMP (CANCER CENTER ONLY)
ALT: 19 U/L (ref 0–44)
AST: 15 U/L (ref 15–41)
Albumin: 3.8 g/dL (ref 3.5–5.0)
Alkaline Phosphatase: 84 U/L (ref 38–126)
Anion gap: 9 (ref 5–15)
BUN: 17 mg/dL (ref 8–23)
CO2: 26 mmol/L (ref 22–32)
Calcium: 9.3 mg/dL (ref 8.9–10.3)
Chloride: 107 mmol/L (ref 98–111)
Creatinine: 1.07 mg/dL (ref 0.61–1.24)
GFR, Est AFR Am: >60 mL/min (ref >60)
GFR, Estimated: >60 mL/min (ref >60)
Glucose, Bld: 121 mg/dL — ABNORMAL HIGH (ref 70–99)
Potassium: 3.9 mmol/L (ref 3.5–5.1)
Sodium: 142 mmol/L (ref 135–145)
Total Bilirubin: 0.6 mg/dL (ref 0.3–1.2)
Total Protein: 6.6 g/dL (ref 6.5–8.1)

## 2019-10-12 MED ORDER — LEUPROLIDE ACETATE (4 MONTH) 30 MG ~~LOC~~ KIT
30.0000 mg | PACK | Freq: Once | SUBCUTANEOUS | Status: AC
  Administered 2019-10-12: 11:00:00 30 mg via SUBCUTANEOUS
  Filled 2019-10-12: qty 30

## 2019-10-12 NOTE — Telephone Encounter (Signed)
Scheduled appt per 2/2 los.  Sent a message to HIM pool to get a calendar mailed out. 

## 2019-10-12 NOTE — Progress Notes (Signed)
Hematology and Oncology Follow Up Visit  Teigen Borello [id number] 29-Dec-1950 69 y.o. 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  He presented with Gleason score 4+5 = 9 and PSA of 14.7 and lymphadenopathy.   Prior Therapy: He is status post prostate biopsy in August 2018.  Current therapy:  Androgen deprivation therapy under the care of Dr. Jeffie Pollock started in September 2018.  He is currently receiving Lupron 30 mg every 4 months started on Jan 29, 2019.  Zytiga 1000 mg daily with prednisone 5 mg daily started in October 2018.  Interim History: Mr. Twigg is here for return evaluation.  Since the last visit, he reports no major changes in his health.  He is currently recovering from an episode of bronchitis requiring antibiotic but no hospitalization.  He denies any complications related to Zytiga at this time.  He continues to eat well and gaining weight at this time.  He denies any bone pain or pathological fractures.  Performance status and quality of life remains unchanged.       Medications: Reviewed without changes. Current Outpatient Medications  Medication Sig Dispense Refill  . abiraterone acetate (ZYTIGA) 250 MG tablet TAKE 4 TABLETS (1,000 MG TOTAL) BY MOUTH DAILY. TAKE ON AN EMPTY STOMACH, 1 HOUR BEFORE OR 2 HOURS AFTER A MEAL. 120 tablet 11  . albuterol (PROVENTIL) (2.5 MG/3ML) 0.083% nebulizer solution Inhale 2.5 mLs into the lungs 4 (four) times daily as needed for wheezing or shortness of breath.    Marland Kitchen albuterol (VENTOLIN HFA) 108 (90 Base) MCG/ACT inhaler Inhale 2 puffs into the lungs every 4 (four) hours as needed for wheezing or shortness of breath.    Marland Kitchen amLODipine (NORVASC) 5 MG tablet Take 5 mg by mouth daily.    Marland Kitchen azithromycin (ZITHROMAX) 250 MG tablet Take 250 mg by mouth See admin instructions. Take 500mg  on day 1 then 250mg  for 4 days.    Marland Kitchen MYRBETRIQ 25  MG TB24 tablet Take 25 mg by mouth daily.    . predniSONE (DELTASONE) 5 MG tablet Take 5 mg by mouth daily with breakfast.    . predniSONE (STERAPRED UNI-PAK 21 TAB) 10 MG (21) TBPK tablet Take 1 tablet by mouth taper from 4 doses each day to 1 dose and stop.    . tamsulosin (FLOMAX) 0.4 MG CAPS capsule Take 1 capsule (0.4 mg total) by mouth daily after supper. 30 capsule 0   No current facility-administered medications for this visit.     Allergies: No Known Allergies     Physical Exam:    ECOG: 1     General appearance: Alert, awake without any distress. Head: Atraumatic without abnormalities Oropharynx: Without any thrush or ulcers. Eyes: No scleral icterus. Lymph nodes: No lymphadenopathy noted in the cervical, supraclavicular, or axillary nodes Heart:regular rate and rhythm, without any murmurs or gallops.   Lung: Clear to auscultation without any rhonchi, wheezes or dullness to percussion. Abdomin: Soft, nontender without any shifting dullness or ascites. Musculoskeletal: No clubbing or cyanosis. Neurological: No motor or sensory deficits. Skin: No rashes or lesions.            Lab Results: Lab Results  Component Value Date   WBC 15.6 (H) 09/02/2019   HGB 14.8 09/02/2019   HCT 45.2 09/02/2019   MCV 86.6 09/02/2019   PLT 245 09/02/2019     Chemistry      Component Value Date/Time  NA 141 09/02/2019 1642   NA 142 09/10/2017 1509   K 4.0 09/02/2019 1642   K 4.2 09/10/2017 1509   CL 106 09/02/2019 1642   CO2 25 09/02/2019 1642   CO2 25 09/10/2017 1509   BUN 21 09/02/2019 1642   BUN 19.4 09/10/2017 1509   CREATININE 1.14 09/02/2019 1642   CREATININE 1.23 06/09/2019 0937   CREATININE 1.2 09/10/2017 1509      Component Value Date/Time   CALCIUM 9.6 09/02/2019 1642   CALCIUM 9.7 09/10/2017 1509   ALKPHOS 91 09/02/2019 1642   ALKPHOS 84 09/10/2017 1509   AST 20 09/02/2019 1642   AST 13 (L) 06/09/2019 0937   AST 14 09/10/2017 1509   ALT 19  09/02/2019 1642   ALT 17 06/09/2019 0937   ALT 18 09/10/2017 1509   BILITOT 0.6 09/02/2019 1642   BILITOT 0.3 06/09/2019 0937   BILITOT 0.38 09/10/2017 1509         Results for CHAD, HOERIG (MRN [id number]) as of 10/12/2019 09:31  Ref. Range 04/07/2019 08:40 06/09/2019 09:37  Prostate Specific Ag, Serum Latest Ref Range: 0.0 - 4.0 ng/mL <0.1 <0.1      IMPRESSION: No acute intrathoracic pathology. No CT evidence of central pulmonary artery embolus.     Impression and Plan:  69 year old man with:  1.  Castration-sensitive prostate cancer with lymphadenopathy diagnosed in 2018.   He continues to tolerate Zytiga without any major complaints at this time.  His PSA remains undetectable.  Imaging studies including CT scan previous bone scan have not showed any progression of disease.  Risks and benefits of continuing this approach was discussed.  Alternative options to Zytiga were also reviewed including Xtandi and systemic chemotherapy.  He is will be used if he developed castration hyper resistant disease.   2.  Androgen deprivation therapy: He will receive Eligard today and repeated in 4 months.  Complications including weight gain, hot flashes and sexual dysfunction were reiterated.  3.  Hypertension: Blood pressure is elevated today but has been normal between visits.  I asked him to continue to monitor measure between visits.  No adjustment in blood pressure medication at this time.  4.  Electrolyte and liver function test monitoring: No abnormalities noted I will continue to monitor.  His potassium has been normal without any abnormalities of liver function test.  5.  Prognosis and goals of care: Therapy remains palliative although aggressive measures are warranted given his excellent performance status.  6.  Follow-up: In 4 months for repeat evaluation.  30  minutes was dedicated to this encounter.  The time was spent on reviewing laboratory data, disease status  update, treatment options and complications of therapy.    Zola Button, MD 2/2/20219:29 AM

## 2019-10-12 NOTE — Patient Instructions (Signed)

## 2019-10-13 ENCOUNTER — Telehealth: Payer: Self-pay

## 2019-10-13 LAB — PROSTATE-SPECIFIC AG, SERUM (LABCORP): Prostate Specific Ag, Serum: <0.1 ng/mL (ref 0.0–4.0)

## 2019-10-13 NOTE — Telephone Encounter (Signed)
Called and informed patient of lab results below. Patient verbalized understanding.

## 2019-10-13 NOTE — Telephone Encounter (Signed)
-----   Message from Wyatt Portela, MD sent at 10/13/2019  7:55 AM EST ----- Please let him know his PSA still low

## 2019-10-15 ENCOUNTER — Ambulatory Visit: Payer: Medicare HMO

## 2019-10-21 DIAGNOSIS — R69 Illness, unspecified: Secondary | ICD-10-CM | POA: Diagnosis not present

## 2019-10-25 DIAGNOSIS — R69 Illness, unspecified: Secondary | ICD-10-CM | POA: Diagnosis not present

## 2019-10-28 MED FILL — ABIRATERONE ACETATE 250 MG: 250 | 30 days supply | Qty: 120 | Fill #5

## 2019-11-15 DIAGNOSIS — R69 Illness, unspecified: Secondary | ICD-10-CM | POA: Diagnosis not present

## 2019-11-17 ENCOUNTER — Other Ambulatory Visit: Payer: Self-pay | Admitting: Oncology

## 2019-11-17 MED FILL — predniSONE 5 MG TABS: 5 | 30 days supply | Qty: 30 | Fill #0

## 2019-11-23 MED FILL — ABIRATERONE ACETATE 250 MG: 250 | 30 days supply | Qty: 120 | Fill #6

## 2019-12-23 MED FILL — predniSONE 5 MG TABS: 5 | 30 days supply | Qty: 30 | Fill #1

## 2019-12-23 MED FILL — ABIRATERONE ACETATE 250 MG: 250 | 30 days supply | Qty: 120 | Fill #7

## 2019-12-30 DIAGNOSIS — E7849 Other hyperlipidemia: Secondary | ICD-10-CM | POA: Diagnosis not present

## 2019-12-30 DIAGNOSIS — R7309 Other abnormal glucose: Secondary | ICD-10-CM | POA: Diagnosis not present

## 2019-12-30 DIAGNOSIS — R0609 Other forms of dyspnea: Secondary | ICD-10-CM | POA: Diagnosis not present

## 2019-12-30 DIAGNOSIS — I2510 Atherosclerotic heart disease of native coronary artery without angina pectoris: Secondary | ICD-10-CM | POA: Diagnosis not present

## 2019-12-30 DIAGNOSIS — M5117 Intervertebral disc disorders with radiculopathy, lumbosacral region: Secondary | ICD-10-CM | POA: Diagnosis not present

## 2019-12-30 DIAGNOSIS — I700 Atherosclerosis of aorta: Secondary | ICD-10-CM | POA: Diagnosis not present

## 2019-12-30 DIAGNOSIS — N200 Calculus of kidney: Secondary | ICD-10-CM | POA: Diagnosis not present

## 2019-12-30 DIAGNOSIS — E669 Obesity, unspecified: Secondary | ICD-10-CM | POA: Diagnosis not present

## 2020-01-11 DIAGNOSIS — C61 Malignant neoplasm of prostate: Secondary | ICD-10-CM | POA: Diagnosis not present

## 2020-01-11 DIAGNOSIS — Z79899 Other long term (current) drug therapy: Secondary | ICD-10-CM | POA: Diagnosis not present

## 2020-01-11 DIAGNOSIS — I10 Essential (primary) hypertension: Secondary | ICD-10-CM | POA: Diagnosis not present

## 2020-01-11 DIAGNOSIS — Z833 Family history of diabetes mellitus: Secondary | ICD-10-CM | POA: Diagnosis not present

## 2020-01-11 DIAGNOSIS — R32 Unspecified urinary incontinence: Secondary | ICD-10-CM | POA: Diagnosis not present

## 2020-01-11 DIAGNOSIS — N400 Enlarged prostate without lower urinary tract symptoms: Secondary | ICD-10-CM | POA: Diagnosis not present

## 2020-01-11 DIAGNOSIS — Z809 Family history of malignant neoplasm, unspecified: Secondary | ICD-10-CM | POA: Diagnosis not present

## 2020-01-11 DIAGNOSIS — E669 Obesity, unspecified: Secondary | ICD-10-CM | POA: Diagnosis not present

## 2020-01-11 DIAGNOSIS — Z7952 Long term (current) use of systemic steroids: Secondary | ICD-10-CM | POA: Diagnosis not present

## 2020-01-11 DIAGNOSIS — Z6839 Body mass index (BMI) 39.0-39.9, adult: Secondary | ICD-10-CM | POA: Diagnosis not present

## 2020-01-19 NOTE — Progress Notes (Signed)
Date:  01/21/2020   ID:  Bobby Chatters., DOB February 12, 1951, MRN [id number]  PCP:  [name], MD  Cardiologist: Rex Kras, DO, [facility] (established care 01/21/2020)  REASON FOR CONSULT: Coronary arteriosclerosis  REQUESTING PHYSICIAN:  [name], [place] [address] [place],  [place] [address]  Chief Complaint  Patient presents with  . Coronary Artery Disease  . New Patient (Initial Visit)    HPI  Bobby Beville. is a 69 y.o. male who is being seen today for the evaluation of coronary artery calcification at the request of [name], MD. Patient's past medical history and cardiac risk factors include: Hypertension, hyperlipidemia, prediabetes, former smoker, advanced age, obesity.  Patient is referred to the office at the request of his primary care provider for evaluation of coronary artery disease. He had a CT study to evaluate for PE and pneumonia back in December 2020 and was noted to have coronary artery calcification. Findings noted below for further reference. Clinically patient is doing well and does not have any active chest pain or anginal equivalents.  Patient has multiple cardiovascular risk factors as mentioned above and therefore is referred to cardiology for further evaluation.  No family history of premature coronary disease or sudden cardiac death.  Denies prior history of coronary artery disease, myocardial infarction, congestive heart failure, deep venous thrombosis, pulmonary embolism, stroke, transient ischemic attack.  FUNCTIONAL STATUS: Limited due to back pain.    ALLERGIES: No Known Allergies  MEDICATION LIST PRIOR TO VISIT: Current Meds  Medication Sig  . abiraterone acetate (ZYTIGA) 250 MG tablet TAKE 4 TABLETS (1,000 MG TOTAL) BY MOUTH DAILY. TAKE ON AN EMPTY STOMACH, 1 HOUR BEFORE OR 2 HOURS AFTER A MEAL.  Marland Kitchen albuterol (PROVENTIL) (2.5 MG/3ML) 0.083% nebulizer solution Inhale 2.5 mLs into the lungs 4 (four) times daily as needed for  wheezing or shortness of breath.  . losartan-hydrochlorothiazide (HYZAAR) 50-12.5 MG tablet Take 1 tablet by mouth daily.  . Multiple Vitamins-Minerals (MULTIVITAMIN WITH MINERALS) tablet Take 1 tablet by mouth daily.  Marland Kitchen MYRBETRIQ 25 MG TB24 tablet Take 25 mg by mouth daily.  . predniSONE (DELTASONE) 5 MG tablet TAKE 1 TABLET BY MOUTH DAILY WITH BREAKFAST  . tamsulosin (FLOMAX) 0.4 MG CAPS capsule Take 1 capsule (0.4 mg total) by mouth daily after supper.     PAST MEDICAL HISTORY: Past Medical History:  Diagnosis Date  . Bronchitis   . Coronary artery calcification of native artery   . Family history of pancreatic cancer   . History of kidney stones   . Hyperlipidemia   . Hypertension   . Pneumonia   . Prostate cancer ([place])     PAST SURGICAL HISTORY: Past Surgical History:  Procedure Laterality Date  . EXTRACORPOREAL SHOCK WAVE LITHOTRIPSY Right 04/17/2017   Procedure: RIGHT EXTRACORPOREAL SHOCK WAVE LITHOTRIPSY (ESWL);  Surgeon: Kathie Rhodes, MD;  Location: [facility];  Service: Urology;  Laterality: Right;  . HERNIA REPAIR     age 89  . PROSTATE BIOPSY      FAMILY HISTORY: The patient family history includes Breast cancer in his maternal aunt; Breast cancer (age of onset: 33) in his sister; Kidney cancer in his maternal aunt; Pancreatic cancer (age of onset: 41) in his brother.  SOCIAL HISTORY:  The patient  reports that he quit smoking about 8 years ago. His smoking use included cigarettes. He has a 2.50 pack-year smoking history. He has never used smokeless tobacco. He reports that he does not drink alcohol or use drugs.  REVIEW OF SYSTEMS: Review of Systems  Constitution: Negative for chills and fever.  HENT: Negative for hoarse voice and nosebleeds.   Eyes: Negative for discharge, double vision and pain.  Cardiovascular: Negative for chest pain, claudication, dyspnea on exertion, leg swelling, near-syncope, orthopnea, palpitations, paroxysmal nocturnal dyspnea and syncope.    Respiratory: Negative for hemoptysis and shortness of breath.   Musculoskeletal: Negative for muscle cramps and myalgias.  Gastrointestinal: Negative for abdominal pain, constipation, diarrhea, hematemesis, hematochezia, melena, nausea and vomiting.  Neurological: Negative for dizziness and light-headedness.    PHYSICAL EXAM: Vitals with BMI 01/21/2020 10/12/2019 09/02/2019  Height 5\' 6"  5\' 6"  -  Weight 256 lbs 256 lbs 13 oz -  BMI [id number] -  Systolic 0000000 [id number]  Diastolic 81 84 [id number]  Pulse 91 80 105    CONSTITUTIONAL: Well-developed and well-nourished. No acute distress.  SKIN: Skin is warm and dry. No rash noted. No cyanosis. No pallor. No jaundice HEAD: Normocephalic and atraumatic.  EYES: No scleral icterus MOUTH/THROAT: Moist oral membranes.  NECK: No JVD present. No thyromegaly noted. No carotid bruits  LYMPHATIC: No visible cervical adenopathy.  CHEST Normal respiratory effort. No intercostal retractions  LUNGS: Decreased breath sounds at the bases. No stridor. No wheezes. No rales.  CARDIOVASCULAR: Regular rate and rhythm, positive S1-S2, no murmurs rubs or gallops appreciated. ABDOMINAL: Obese, soft, nontender, nondistended, positive bowel sounds all 4 quadrants. No apparent ascites.  EXTREMITIES: Trace bilateral pitting peripheral edema  HEMATOLOGIC: No significant bruising NEUROLOGIC: Oriented to person, place, and time. Nonfocal. Normal muscle tone.  PSYCHIATRIC: Normal mood and affect. Normal behavior. Cooperative  RADIOLOGY: CT angio chest PE with and without contrast September 02, 2019:Cardiovascular: There is mild cardiomegaly. No pericardial effusion. There is multi vessel coronary vascular calcification. There is mild atherosclerotic calcification of the thoracic aorta.  CARDIAC DATABASE: EKG: 01/21/2020: Normal sinus rhythm, 92 bpm, normal axis, ST depressions in the high lateral and lateral leads, no evidence of myocardial injury pattern.    Echocardiogram: None  Stress Testing: None  Heart Catheterization: None  LABORATORY DATA: CBC Latest Ref Rng & Units 10/12/2019 09/02/2019 06/09/2019  WBC 4.0 - 10.5 K/uL 7.1 15.6(H) 7.6  Hemoglobin 13.0 - 17.0 g/dL 14.3 14.8 13.7  Hematocrit 39.0 - 52.0 % 43.3 45.2 41.5  Platelets 150 - 400 K/uL 234 245 178    CMP Latest Ref Rng & Units 10/12/2019 09/02/2019 06/09/2019  Glucose 70 - 99 mg/dL 121(H) 156(H) 148(H)  BUN 8 - 23 mg/dL 17 21 18   Creatinine 0.61 - 1.24 mg/dL 1.07 1.14 1.23  Sodium 135 - 145 mmol/L 142 141 140  Potassium 3.5 - 5.1 mmol/L 3.9 4.0 3.7  Chloride 98 - 111 mmol/L 107 106 107  CO2 22 - 32 mmol/L 26 25 24   Calcium 8.9 - 10.3 mg/dL 9.3 9.6 9.2  Total Protein 6.5 - 8.1 g/dL 6.6 7.2 6.3(L)  Total Bilirubin 0.3 - 1.2 mg/dL 0.6 0.6 0.3  Alkaline Phos 38 - 126 U/L 84 91 84  AST 15 - 41 U/L 15 20 13(L)  ALT 0 - 44 U/L 19 19 17    Hepatic Function Panel Recent Labs    06/09/19 0937 09/02/19 1642 10/12/19 0920  PROT 6.3* 7.2 6.6  ALBUMIN 3.7 4.1 3.8  AST 13* 20 15  ALT 17 19 19   ALKPHOS 84 91 84  BILITOT 0.3 0.6 0.6   External Labs: Lipid Panel 12/30/2019: total 155, Triglycerides 167, HDL 44, LDL 78  12/30/2019: A1C 5.4% %  Glucose Random 115 BUN 14, Creatinine, Serum 0.9  03/19/2019: PSA normal   06/05/2018: TSH 1.000 micr   IMPRESSION:    ICD-10-CM   1. Coronary artery calcification  I25.10 EKG 12-Lead   I25.84 PCV ECHOCARDIOGRAM COMPLETE    PCV MYOCARDIAL PERFUSION WITH LEXISCAN    atorvastatin (LIPITOR) 20 MG tablet    aspirin EC 81 MG tablet  2. Mixed hyperlipidemia  E78.2 atorvastatin (LIPITOR) 20 MG tablet  3. Benign hypertension  I10   4. Prediabetes  R73.03 atorvastatin (LIPITOR) 20 MG tablet  5. Former smoker  Z87.891      RECOMMENDATIONS: Bobby Anastas. is a 69 y.o. male whose past medical history and cardiac risk factors include: Coronary calcification on nongated CT, prediabetes, hyperlipidemia, hypertension, advanced age,  former smoker, obesity due to excess calories.  Coronary calcification:  Patient had a CT study to evaluate for pulmonary embolism and pneumonia back in December 2020 and was noted to have coronary artery calcification as noted above.  Patient is referred to the office for further evaluation.  Recommend aspirin 81 mg p.o. daily and statin therapy.   We will start Lipitor 20 mg p.o. nightly.  We will repeat a fasting lipid profile in 6 weeks.  This will be ordered at the next visit.  Echocardiogram will be ordered to evaluate for structural heart disease and left ventricular systolic function.  Nuclear stress test recommended to evaluate for reversible ischemia.  Benign essential hypertension:  Patient's blood pressure at today's office visit is not at goal.  However he is working with his primary care provider in regards of medication titration.  Patient states that he was restarted on any medication couple days ago.  Will defer to primary team  Mixed hyperlipidemia: In the setting of prediabetes and coronary artery calcification recommend Lipitor 20 mg p.o. nightly.  Medication profile discussed with the patient.  Patient is also encouraged to see his primary care provider and possibly being referred to sleep medicine for obstructive sleep apnea.  Clinically I anticipate that he does have a degree of sleep apnea given the fact that he has elevated blood pressures, neck anatomy, when he wakes up he has dry mouth and/or headaches at times as well.  Obesity, due to excess calories: Body mass index is 41.32 kg/m. . I reviewed with the patient the importance of diet, regular physical activity/exercise, weight loss.   . Patient is educated on increasing physical activity gradually as tolerated.  With the goal of moderate intensity exercise for 30 minutes a day 5 days a week.  Former smoker: Educated on the importance of continued smoking cessation.   FINAL MEDICATION LIST END OF  ENCOUNTER: Meds ordered this encounter  Medications  . atorvastatin (LIPITOR) 20 MG tablet    Sig: Take 1 tablet (20 mg total) by mouth at bedtime.    Dispense:  30 tablet    Refill:  2  . aspirin EC 81 MG tablet    Sig: Take 1 tablet (81 mg total) by mouth daily.    Dispense:  90 tablet    Refill:  3    There are no discontinued medications.   Current Outpatient Medications:  .  abiraterone acetate (ZYTIGA) 250 MG tablet, TAKE 4 TABLETS (1,000 MG TOTAL) BY MOUTH DAILY. TAKE ON AN EMPTY STOMACH, 1 HOUR BEFORE OR 2 HOURS AFTER A MEAL., Disp: 120 tablet, Rfl: 11 .  albuterol (PROVENTIL) (2.5 MG/3ML) 0.083% nebulizer solution, Inhale 2.5 mLs into the lungs 4 (four) times  daily as needed for wheezing or shortness of breath., Disp: , Rfl:  .  losartan-hydrochlorothiazide (HYZAAR) 50-12.5 MG tablet, Take 1 tablet by mouth daily., Disp: , Rfl:  .  Multiple Vitamins-Minerals (MULTIVITAMIN WITH MINERALS) tablet, Take 1 tablet by mouth daily., Disp: , Rfl:  .  MYRBETRIQ 25 MG TB24 tablet, Take 25 mg by mouth daily., Disp: , Rfl:  .  predniSONE (DELTASONE) 5 MG tablet, TAKE 1 TABLET BY MOUTH DAILY WITH BREAKFAST, Disp: 30 tablet, Rfl: 2 .  tamsulosin (FLOMAX) 0.4 MG CAPS capsule, Take 1 capsule (0.4 mg total) by mouth daily after supper., Disp: 30 capsule, Rfl: 0 .  aspirin EC 81 MG tablet, Take 1 tablet (81 mg total) by mouth daily., Disp: 90 tablet, Rfl: 3 .  atorvastatin (LIPITOR) 20 MG tablet, Take 1 tablet (20 mg total) by mouth at bedtime., Disp: 30 tablet, Rfl: 2  Orders Placed This Encounter  Procedures  . PCV MYOCARDIAL PERFUSION WITH LEXISCAN  . EKG 12-Lead  . PCV ECHOCARDIOGRAM COMPLETE   --Continue cardiac medications as reconciled in final medication list. --Return in about 6 weeks (around 03/03/2020) for re-evaluation of symptoms., review test results.. Or sooner if needed. --Continue follow-up with your primary care physician regarding the management of your other chronic comorbid  conditions.  Patient's questions and concerns were addressed to his satisfaction. He voices understanding of the instructions provided during this encounter.   This note was created using a voice recognition software as a result there may be grammatical errors inadvertently enclosed that do not reflect the nature of this encounter. Every attempt is made to correct such errors.  Rex Kras, [place], [facility]  Pager: [phone] Office: [phone]

## 2020-01-20 DIAGNOSIS — M5117 Intervertebral disc disorders with radiculopathy, lumbosacral region: Secondary | ICD-10-CM | POA: Diagnosis not present

## 2020-01-21 ENCOUNTER — Ambulatory Visit: Payer: Medicare HMO | Admitting: Cardiology

## 2020-01-21 ENCOUNTER — Encounter: Payer: Self-pay | Admitting: Cardiology

## 2020-01-21 VITALS — BP 153/81 | HR 91 | Temp 97.8°F | Resp 17 | Ht 66.0 in | Wt 256.0 lb

## 2020-01-21 DIAGNOSIS — Z87891 Personal history of nicotine dependence: Secondary | ICD-10-CM

## 2020-01-21 DIAGNOSIS — I10 Essential (primary) hypertension: Secondary | ICD-10-CM | POA: Diagnosis not present

## 2020-01-21 DIAGNOSIS — I2584 Coronary atherosclerosis due to calcified coronary lesion: Secondary | ICD-10-CM

## 2020-01-21 DIAGNOSIS — R69 Illness, unspecified: Secondary | ICD-10-CM | POA: Diagnosis not present

## 2020-01-21 DIAGNOSIS — E782 Mixed hyperlipidemia: Secondary | ICD-10-CM | POA: Diagnosis not present

## 2020-01-21 DIAGNOSIS — R7303 Prediabetes: Secondary | ICD-10-CM | POA: Diagnosis not present

## 2020-01-21 DIAGNOSIS — I2510 Atherosclerotic heart disease of native coronary artery without angina pectoris: Secondary | ICD-10-CM

## 2020-01-21 MED ORDER — ATORVASTATIN CALCIUM 20 MG PO TABS: 20.0000 mg | ORAL_TABLET | Freq: Every day | ORAL | 2 refills | Status: DC

## 2020-01-21 MED ORDER — ASPIRIN EC 81 MG PO TBEC: 81.0000 mg | DELAYED_RELEASE_TABLET | Freq: Every day | ORAL | 3 refills | Status: AC

## 2020-01-25 DIAGNOSIS — M9902 Segmental and somatic dysfunction of thoracic region: Secondary | ICD-10-CM | POA: Diagnosis not present

## 2020-01-25 DIAGNOSIS — M9904 Segmental and somatic dysfunction of sacral region: Secondary | ICD-10-CM | POA: Diagnosis not present

## 2020-01-26 ENCOUNTER — Ambulatory Visit: Payer: Medicare HMO

## 2020-01-26 ENCOUNTER — Other Ambulatory Visit: Payer: Self-pay

## 2020-01-26 DIAGNOSIS — I2584 Coronary atherosclerosis due to calcified coronary lesion: Secondary | ICD-10-CM | POA: Diagnosis not present

## 2020-01-26 DIAGNOSIS — I10 Essential (primary) hypertension: Secondary | ICD-10-CM | POA: Diagnosis not present

## 2020-01-26 DIAGNOSIS — I2510 Atherosclerotic heart disease of native coronary artery without angina pectoris: Secondary | ICD-10-CM | POA: Diagnosis not present

## 2020-01-27 MED FILL — predniSONE 5 MG TABS: 5 | 30 days supply | Qty: 30 | Fill #2

## 2020-01-27 MED FILL — ABIRATERONE ACETATE 250 MG: 250 | 30 days supply | Qty: 120 | Fill #8

## 2020-01-28 DIAGNOSIS — M9904 Segmental and somatic dysfunction of sacral region: Secondary | ICD-10-CM | POA: Diagnosis not present

## 2020-01-28 DIAGNOSIS — M9902 Segmental and somatic dysfunction of thoracic region: Secondary | ICD-10-CM | POA: Diagnosis not present

## 2020-01-31 MED FILL — TAMSULOSIN HCL 0.4 MG CAP: 0.4 | 90 days supply | Qty: 90 | Fill #1

## 2020-02-03 ENCOUNTER — Other Ambulatory Visit: Payer: Self-pay | Admitting: Cardiology

## 2020-02-03 DIAGNOSIS — R7303 Prediabetes: Secondary | ICD-10-CM

## 2020-02-03 DIAGNOSIS — I2510 Atherosclerotic heart disease of native coronary artery without angina pectoris: Secondary | ICD-10-CM

## 2020-02-03 DIAGNOSIS — E782 Mixed hyperlipidemia: Secondary | ICD-10-CM

## 2020-02-04 DIAGNOSIS — M9904 Segmental and somatic dysfunction of sacral region: Secondary | ICD-10-CM | POA: Diagnosis not present

## 2020-02-04 DIAGNOSIS — M9902 Segmental and somatic dysfunction of thoracic region: Secondary | ICD-10-CM | POA: Diagnosis not present

## 2020-02-09 ENCOUNTER — Ambulatory Visit: Payer: Medicare HMO

## 2020-02-09 ENCOUNTER — Ambulatory Visit: Payer: Medicare HMO | Admitting: Oncology

## 2020-02-09 ENCOUNTER — Other Ambulatory Visit: Payer: Medicare HMO

## 2020-02-14 DIAGNOSIS — M9902 Segmental and somatic dysfunction of thoracic region: Secondary | ICD-10-CM | POA: Diagnosis not present

## 2020-02-14 DIAGNOSIS — M9904 Segmental and somatic dysfunction of sacral region: Secondary | ICD-10-CM | POA: Diagnosis not present

## 2020-02-15 ENCOUNTER — Other Ambulatory Visit: Payer: Self-pay | Admitting: Oncology

## 2020-02-15 DIAGNOSIS — C61 Malignant neoplasm of prostate: Secondary | ICD-10-CM

## 2020-02-16 ENCOUNTER — Other Ambulatory Visit: Payer: Self-pay

## 2020-02-16 ENCOUNTER — Inpatient Hospital Stay: Payer: Medicare HMO

## 2020-02-16 ENCOUNTER — Inpatient Hospital Stay: Payer: Medicare HMO | Attending: Oncology

## 2020-02-16 ENCOUNTER — Telehealth: Payer: Self-pay | Admitting: Oncology

## 2020-02-16 ENCOUNTER — Inpatient Hospital Stay: Payer: Medicare HMO | Admitting: Oncology

## 2020-02-16 VITALS — BP 149/78 | HR 76 | Temp 97.7°F | Resp 18 | Ht 66.0 in | Wt 252.4 lb

## 2020-02-16 DIAGNOSIS — C61 Malignant neoplasm of prostate: Secondary | ICD-10-CM | POA: Insufficient documentation

## 2020-02-16 DIAGNOSIS — Z5111 Encounter for antineoplastic chemotherapy: Secondary | ICD-10-CM | POA: Insufficient documentation

## 2020-02-16 LAB — CMP (CANCER CENTER ONLY)
ALT: 21 U/L (ref 0–44)
AST: 17 U/L (ref 15–41)
Albumin: 3.5 g/dL (ref 3.5–5.0)
Alkaline Phosphatase: 94 U/L (ref 38–126)
Anion gap: 11 (ref 5–15)
BUN: 20 mg/dL (ref 8–23)
CO2: 24 mmol/L (ref 22–32)
Calcium: 9.8 mg/dL (ref 8.9–10.3)
Chloride: 107 mmol/L (ref 98–111)
Creatinine: 0.96 mg/dL (ref 0.61–1.24)
GFR, Est AFR Am: >60 mL/min (ref >60)
GFR, Estimated: >60 mL/min (ref >60)
Glucose, Bld: 120 mg/dL — ABNORMAL HIGH (ref 70–99)
Potassium: 3.9 mmol/L (ref 3.5–5.1)
Sodium: 142 mmol/L (ref 135–145)
Total Bilirubin: 0.6 mg/dL (ref 0.3–1.2)
Total Protein: 6.7 g/dL (ref 6.5–8.1)

## 2020-02-16 LAB — CBC WITH DIFFERENTIAL (CANCER CENTER ONLY)
Abs Immature Granulocytes: 0.04 10*3/uL (ref 0.00–0.07)
Basophils Absolute: 0.1 10*3/uL (ref 0.0–0.1)
Basophils Relative: 1 %
Eosinophils Absolute: 0.3 10*3/uL (ref 0.0–0.5)
Eosinophils Relative: 4 %
HCT: 39.5 % (ref 39.0–52.0)
Hemoglobin: 13.2 g/dL (ref 13.0–17.0)
Immature Granulocytes: 1 %
Lymphocytes Relative: 15 %
Lymphs Abs: 1.2 10*3/uL (ref 0.7–4.0)
MCH: 27.2 pg (ref 26.0–34.0)
MCHC: 33.4 g/dL (ref 30.0–36.0)
MCV: 81.3 fL (ref 80.0–100.0)
Monocytes Absolute: 0.6 10*3/uL (ref 0.1–1.0)
Monocytes Relative: 8 %
Neutro Abs: 5.6 10*3/uL (ref 1.7–7.7)
Neutrophils Relative %: 71 %
Platelet Count: 233 10*3/uL (ref 150–400)
RBC: 4.86 MIL/uL (ref 4.22–5.81)
RDW: 14.7 % (ref 11.5–15.5)
WBC Count: 7.8 10*3/uL (ref 4.0–10.5)
nRBC: 0 % (ref 0.0–0.2)

## 2020-02-16 MED ORDER — LEUPROLIDE ACETATE (4 MONTH) 30 MG ~~LOC~~ KIT
30.0000 mg | PACK | Freq: Once | SUBCUTANEOUS | Status: AC
  Administered 2020-02-16: 30 mg via SUBCUTANEOUS

## 2020-02-16 MED ORDER — LEUPROLIDE ACETATE (4 MONTH) 30 MG ~~LOC~~ KIT
PACK | SUBCUTANEOUS | Status: AC
  Filled 2020-02-16: qty 30

## 2020-02-16 NOTE — Progress Notes (Signed)
Hematology and Oncology Follow Up Visit  Bobby [name] [id number] 23-Jul-1951 69 y.o. 02/16/2020 11:37 AM [name], MDSouth, [name], MD   Principle Diagnosis: 69 year old man with advanced prostate cancer with lymphadenopathy diagnosed in 2018.  He presented with Gleason score 4+5 = 9 and PSA of 14.7 and castration-sensitive disease.   Prior Therapy: He is status post prostate biopsy in August 2018.  Current therapy:  Androgen deprivation therapy under the care of Dr. Jeffie Pollock started in September 2018.  He is currently receiving Eligard 30 mg every 4 months started on Jan 29, 2019.  He will receive injection today and repeated in 4 months.  Zytiga 1000 mg daily with prednisone 5 mg daily started in October 2018.  Interim History: Bobby [name] is here for a follow-up visit.  Since the last visit, he reports no major changes in his health.  He continues to tolerate Zytiga without any complaints.  He denies any nausea, vomiting or abdominal pain.  He denies any edema or bone pain.  He has lost some weight intentionally and he is trying to eat healthier and exercise as much as he can.       Medications: Updated on review. Current Outpatient Medications  Medication Sig Dispense Refill  . abiraterone acetate (ZYTIGA) 250 MG tablet TAKE 4 TABLETS (1,000 MG TOTAL) BY MOUTH DAILY. TAKE ON AN EMPTY STOMACH, 1 HOUR BEFORE OR 2 HOURS AFTER A MEAL. 120 tablet 11  . albuterol (PROVENTIL) (2.5 MG/3ML) 0.083% nebulizer solution Inhale 2.5 mLs into the lungs 4 (four) times daily as needed for wheezing or shortness of breath.    Marland Kitchen aspirin EC 81 MG tablet Take 1 tablet (81 mg total) by mouth daily. 90 tablet 3  . losartan-hydrochlorothiazide (HYZAAR) 50-12.5 MG tablet Take 1 tablet by mouth daily.    . Multiple Vitamins-Minerals (MULTIVITAMIN WITH MINERALS) tablet Take 1 tablet by mouth daily.    Marland Kitchen MYRBETRIQ 25 MG TB24 tablet Take 25 mg by mouth daily.    . predniSONE (DELTASONE) 5 MG tablet TAKE 1  TABLET BY MOUTH DAILY WITH BREAKFAST 30 tablet 2  . tamsulosin (FLOMAX) 0.4 MG CAPS capsule Take 1 capsule (0.4 mg total) by mouth daily after supper. 30 capsule 0  . atorvastatin (LIPITOR) 20 MG tablet Take 1 tablet (20 mg total) by mouth at bedtime. (Patient not taking: Reported on 02/16/2020) 90 tablet 2   No current facility-administered medications for this visit.     Allergies: No Known Allergies     Physical Exam:  Blood pressure (!) 149/78, pulse 76, temperature 97.7 F (36.5 C), temperature source Temporal, resp. rate 18, height 5\' 6"  (1.676 m), weight 252 lb 6.4 oz (114.5 kg), SpO2 97 %.    ECOG: 1      General appearance: Comfortable appearing without any discomfort Head: Normocephalic without any trauma Oropharynx: Mucous membranes are moist and pink without any thrush or ulcers. Eyes: Pupils are equal and round reactive to light. Lymph nodes: No cervical, supraclavicular, inguinal or axillary lymphadenopathy.   Heart:regular rate and rhythm.  S1 and S2 without leg edema. Lung: Clear without any rhonchi or wheezes.  No dullness to percussion. Abdomin: Soft, nontender, nondistended with good bowel sounds.  No hepatosplenomegaly. Musculoskeletal: No joint deformity or effusion.  Full range of motion noted. Neurological: No deficits noted on motor, sensory and deep tendon reflex exam. Skin: No petechial rash or dryness.  Appeared moist.              Lab Results:  Lab Results  Component Value Date   WBC 7.8 02/16/2020   HGB 13.2 02/16/2020   HCT 39.5 02/16/2020   MCV 81.3 02/16/2020   PLT 233 02/16/2020     Chemistry      Component Value Date/Time   NA 142 02/16/2020 1054   NA 142 09/10/2017 1509   K 3.9 02/16/2020 1054   K 4.2 09/10/2017 1509   CL 107 02/16/2020 1054   CO2 24 02/16/2020 1054   CO2 25 09/10/2017 1509   BUN 20 02/16/2020 1054   BUN 19.4 09/10/2017 1509   CREATININE 0.96 02/16/2020 1054   CREATININE 1.2 09/10/2017 1509       Component Value Date/Time   CALCIUM 9.8 02/16/2020 1054   CALCIUM 9.7 09/10/2017 1509   ALKPHOS 94 02/16/2020 1054   ALKPHOS 84 09/10/2017 1509   AST 17 02/16/2020 1054   AST 14 09/10/2017 1509   ALT 21 02/16/2020 1054   ALT 18 09/10/2017 1509   BILITOT 0.6 02/16/2020 1054   BILITOT 0.38 09/10/2017 1509        Results for Bobby [name], Bobby [name] (MRN [id number]) as of 02/16/2020 11:39  Ref. Range 06/09/2019 09:37 10/12/2019 09:20  Prostate Specific Ag, Serum Latest Ref Range: 0.0 - 4.0 ng/mL <0.1 <0.1       Impression and Plan:  69 year old man with:  1.  Advanced prostate cancer with lymphadenopathy diagnosed in 2018.  He has castration-sensitive disease at this time.  His disease status was updated today and his PSA continues to be undetectable.  He is currently on Zytiga which she has tolerated without any new complaints.  Risks and benefits of continuing this medication were reviewed which include hypertension, edema and hypokalemia.  Alternative treatment options were discussed including future treatment choices such as lutetium and systemic chemotherapy.   2.  Androgen deprivation therapy: Long-term complication occluding weight gain and hot flashes were reviewed and he is agreeable to proceed today.  He will receive it today and repeat in 4 months.  3.  Hypertension: Related to Zytiga but overall manageable elevation.  For the most part under normal control.  4.  Electrolyte and liver function test monitoring: We will continue to monitor potassium and liver function test.  These are normal today and continue to be followed periodically.  5.  Prognosis and goals of care: Disease is incurable but aggressive measures are warranted at this time given his excellent performance status.  6.  Follow-up: He will return in 4 months for reevaluation as well as injection.  30  minutes were spent on this visit.  The time was dedicated to reviewing laboratory data, discussing disease  status as well as reviewing future treatment options.    Zola Button, MD 6/9/202111:37 AM

## 2020-02-16 NOTE — Patient Instructions (Signed)

## 2020-02-16 NOTE — Telephone Encounter (Signed)
Scheduled appt per 6/9 los.  Spoke with pt and he is aware of the appt date and time

## 2020-02-17 ENCOUNTER — Telehealth: Payer: Self-pay

## 2020-02-17 LAB — PROSTATE-SPECIFIC AG, SERUM (LABCORP): Prostate Specific Ag, Serum: <0.1 ng/mL (ref 0.0–4.0)

## 2020-02-17 NOTE — Telephone Encounter (Signed)
Called patient and made him aware of PSA result. Patient verbalized understanding.  

## 2020-02-17 NOTE — Telephone Encounter (Signed)
-----   Message from Wyatt Portela, MD sent at 02/17/2020  3:03 PM EDT ----- Please let him know his PSA is low

## 2020-02-23 ENCOUNTER — Other Ambulatory Visit: Payer: Self-pay

## 2020-02-23 ENCOUNTER — Ambulatory Visit: Payer: Medicare HMO

## 2020-02-23 DIAGNOSIS — I2584 Coronary atherosclerosis due to calcified coronary lesion: Secondary | ICD-10-CM

## 2020-02-23 DIAGNOSIS — I2510 Atherosclerotic heart disease of native coronary artery without angina pectoris: Secondary | ICD-10-CM | POA: Diagnosis not present

## 2020-02-23 MED FILL — ABIRATERONE ACETATE 250 MG: 250 | 30 days supply | Qty: 120 | Fill #9

## 2020-02-24 ENCOUNTER — Other Ambulatory Visit: Payer: Self-pay | Admitting: Oncology

## 2020-02-24 MED FILL — predniSONE 5 MG TABS: 5 | 30 days supply | Qty: 30 | Fill #0

## 2020-02-25 NOTE — Progress Notes (Signed)
Left vm to cb.

## 2020-03-03 ENCOUNTER — Encounter: Payer: Self-pay | Admitting: Cardiology

## 2020-03-03 ENCOUNTER — Other Ambulatory Visit: Payer: Self-pay

## 2020-03-03 ENCOUNTER — Ambulatory Visit: Payer: Medicare HMO | Admitting: Cardiology

## 2020-03-03 VITALS — BP 150/72 | HR 85 | Ht 66.0 in | Wt 254.0 lb

## 2020-03-03 DIAGNOSIS — Z87891 Personal history of nicotine dependence: Secondary | ICD-10-CM | POA: Diagnosis not present

## 2020-03-03 DIAGNOSIS — I10 Essential (primary) hypertension: Secondary | ICD-10-CM

## 2020-03-03 DIAGNOSIS — R7303 Prediabetes: Secondary | ICD-10-CM

## 2020-03-03 DIAGNOSIS — Z712 Person consulting for explanation of examination or test findings: Secondary | ICD-10-CM | POA: Diagnosis not present

## 2020-03-03 DIAGNOSIS — I2510 Atherosclerotic heart disease of native coronary artery without angina pectoris: Secondary | ICD-10-CM

## 2020-03-03 DIAGNOSIS — I2584 Coronary atherosclerosis due to calcified coronary lesion: Secondary | ICD-10-CM | POA: Diagnosis not present

## 2020-03-03 DIAGNOSIS — E782 Mixed hyperlipidemia: Secondary | ICD-10-CM | POA: Diagnosis not present

## 2020-03-03 MED ORDER — DILTIAZEM HCL ER COATED BEADS 120 MG PO CP24: 120.0000 mg | ORAL_CAPSULE | Freq: Every day | ORAL | 0 refills | Status: DC

## 2020-03-03 MED ORDER — HYDROCHLOROTHIAZIDE 25 MG PO TABS: 25.0000 mg | ORAL_TABLET | Freq: Every morning | ORAL | 0 refills | Status: DC

## 2020-03-03 MED ORDER — LOSARTAN POTASSIUM 50 MG PO TABS: 50.0000 mg | ORAL_TABLET | Freq: Every evening | ORAL | 0 refills | Status: DC

## 2020-03-03 NOTE — Progress Notes (Signed)
Date:  03/03/2020   ID:  Bobby [name]., DOB [birth year], MRN [id number]  PCP:  [name], MD  Cardiologist: Rex Kras, DO, [facility] (established care 01/21/2020)  Date: 03/03/20 Last Office Visit: 01/21/2020  REQUESTING PHYSICIAN:  [name], [place],  [place] [address]  Chief Complaint  Patient presents with  . Results    Bobby [name]. is a 69 y.o. male who presents to the office with a  chief complaint of " review test results."  His past medical history and cardiovascular risk factors are: Hypertension, hyperlipidemia, prediabetes, former smoker, advanced age, obesity.   Patient is accompanied today by his wife [name].   Patient was originally referred to the office for evaluation of  coronary artery calcification.   At the last office visit, he was recommended to be on aspirin and statin due to coronary calcification.  Patient has tolerated initiation of statin therapy well without any side effects or intolerances.  In addition, on EKG he had ST depression in high lateral and lateral leads so was recommended to have echo and stress test. Both results were reviewed with him in great details and noted below for further reference.   Since last office visit his patient denies any chest pain or shortness of breath at rest or with effort related activities.  In addition, patient states that he is not very active to comment effort related activities.   No family history of premature coronary disease or sudden cardiac death.  Denies prior history of myocardial infarction, congestive heart failure, deep venous thrombosis, pulmonary embolism, stroke, transient ischemic attack.  FUNCTIONAL STATUS: Limited due to back pain.    ALLERGIES: No Known Allergies  MEDICATION LIST PRIOR TO VISIT: Current Meds  Medication Sig  . abiraterone acetate (ZYTIGA) 250 MG tablet TAKE 4 TABLETS (1,000 MG TOTAL) BY MOUTH DAILY. TAKE ON AN EMPTY STOMACH, 1 HOUR BEFORE OR 2  HOURS AFTER A MEAL.  Marland Kitchen albuterol (PROVENTIL) (2.5 MG/3ML) 0.083% nebulizer solution Inhale 2.5 mLs into the lungs 4 (four) times daily as needed for wheezing or shortness of breath.  Marland Kitchen aspirin EC 81 MG tablet Take 1 tablet (81 mg total) by mouth daily.  Marland Kitchen atorvastatin (LIPITOR) 20 MG tablet Take 20 mg by mouth daily.  . Multiple Vitamins-Minerals (MULTIVITAMIN WITH MINERALS) tablet Take 1 tablet by mouth daily.  Marland Kitchen MYRBETRIQ 25 MG TB24 tablet Take 25 mg by mouth daily.  . predniSONE (DELTASONE) 5 MG tablet TAKE 1 TABLET BY MOUTH DAILY WITH BREAKFAST  . tamsulosin (FLOMAX) 0.4 MG CAPS capsule Take 1 capsule (0.4 mg total) by mouth daily after supper.  . [DISCONTINUED] atorvastatin (LIPITOR) 20 MG tablet Take 1 tablet (20 mg total) by mouth at bedtime.  . [DISCONTINUED] losartan-hydrochlorothiazide (HYZAAR) 50-12.5 MG tablet Take 1 tablet by mouth daily.     PAST MEDICAL HISTORY: Past Medical History:  Diagnosis Date  . Bronchitis   . Coronary artery calcification of native artery   . Family history of pancreatic cancer   . History of kidney stones   . Hyperlipidemia   . Hypertension   . Pneumonia   . Prostate cancer ([place])     PAST SURGICAL HISTORY: Past Surgical History:  Procedure Laterality Date  . EXTRACORPOREAL SHOCK WAVE LITHOTRIPSY Right 04/17/2017   Procedure: RIGHT EXTRACORPOREAL SHOCK WAVE LITHOTRIPSY (ESWL);  Surgeon: Kathie Rhodes, MD;  Location: [facility];  Service: Urology;  Laterality: Right;  . HERNIA REPAIR     age 58  . PROSTATE  BIOPSY      FAMILY HISTORY: The patient family history includes Breast cancer in his maternal aunt; Breast cancer (age of onset: 21) in his sister; Kidney cancer in his maternal aunt; Pancreatic cancer (age of onset: 8) in his brother.  SOCIAL HISTORY:  The patient  reports that he quit smoking about 8 years ago. His smoking use included cigarettes. He has a 2.50 pack-year smoking history. He has never used smokeless tobacco. He reports that  he does not drink alcohol and does not use drugs.  REVIEW OF SYSTEMS: Review of Systems  Constitutional: Negative for chills and fever.  HENT: Negative for hoarse voice and nosebleeds.   Eyes: Negative for discharge, double vision and pain.  Cardiovascular: Negative for chest pain, claudication, dyspnea on exertion, leg swelling, near-syncope, orthopnea, palpitations, paroxysmal nocturnal dyspnea and syncope.  Respiratory: Positive for shortness of breath. Negative for hemoptysis.   Musculoskeletal: Negative for muscle cramps and myalgias.  Gastrointestinal: Negative for abdominal pain, constipation, diarrhea, hematemesis, hematochezia, melena, nausea and vomiting.  Neurological: Negative for dizziness and light-headedness.    PHYSICAL EXAM: Vitals with BMI 03/03/2020 02/16/2020 01/21/2020  Height 5\' 6"  5\' 6"  5\' 6"   Weight 254 lbs 252 lbs 6 oz 256 lbs  BMI 41.02 40.98 11.91  Systolic 478 295 621  Diastolic 72 78 81  Pulse 85 76 91    CONSTITUTIONAL: Well-developed and well-nourished. No acute distress.  SKIN: Skin is warm and dry. No rash noted. No cyanosis. No pallor. No jaundice HEAD: Normocephalic and atraumatic.  EYES: No scleral icterus MOUTH/THROAT: Moist oral membranes.  NECK: No JVD present. No thyromegaly noted. No carotid bruits  LYMPHATIC: No visible cervical adenopathy.  CHEST Normal respiratory effort. No intercostal retractions  LUNGS: Decreased breath sounds at the bases. No stridor. No wheezes. No rales.  CARDIOVASCULAR: Regular rate and rhythm, positive S1-S2, no murmurs rubs or gallops appreciated. ABDOMINAL: Obese, soft, nontender, nondistended, positive bowel sounds all 4 quadrants. No apparent ascites.  EXTREMITIES: Trace bilateral pitting peripheral edema  HEMATOLOGIC: No significant bruising NEUROLOGIC: Oriented to person, place, and time. Nonfocal. Normal muscle tone.  PSYCHIATRIC: Normal mood and affect. Normal behavior. Cooperative  RADIOLOGY: CT angio  chest PE with and without contrast September 02, 2019:Cardiovascular: There is mild cardiomegaly. No pericardial effusion. There is multi vessel coronary vascular calcification. There is mild atherosclerotic calcification of the thoracic aorta.  CARDIAC DATABASE: EKG: 01/21/2020: Normal sinus rhythm, 92 bpm, normal axis, ST depressions in the high lateral and lateral leads, no evidence of myocardial injury pattern.   Echocardiogram: 01/26/2020: Hyperdynamic LV systolic function with visual EF >70%. Intraventricular PG of 31 mm Hg detected.  Left ventricle cavity is normal in size. Moderate concentric hypertrophy of the left ventricle. Normal global wall motion. Doppler evidence of grade I (impaired) diastolic dysfunction, elevated LAP. Mild (Grade I) aortic regurgitation. Mild (Grade I) mitral regurgitation.   Stress Testing: Lexiscan/modified Bruce Tetrofosmin stress test 02/23/2020: Stress EKG showed sinus tachycardia, inferolateral T wave inversion.  SPECT images show small sized, medium intensity, mid to basal inferolateral perfusion defect with mild reversibility. Stress LVEF 82%. Low risk study.  Heart Catheterization: None  LABORATORY DATA: CBC Latest Ref Rng & Units 02/16/2020 10/12/2019 09/02/2019  WBC 4.0 - 10.5 K/uL 7.8 7.1 15.6(H)  Hemoglobin 13.0 - 17.0 g/dL 13.2 14.3 14.8  Hematocrit 39 - 52 % 39.5 43.3 45.2  Platelets 150 - 400 K/uL 233 234 245    CMP Latest Ref Rng & Units 02/16/2020 10/12/2019 09/02/2019  Glucose  70 - 99 mg/dL 120(H) 121(H) 156(H)  BUN 8 - 23 mg/dL 20 17 21   Creatinine 0.61 - 1.24 mg/dL 0.96 1.07 1.14  Sodium 135 - 145 mmol/L 142 142 141  Potassium 3.5 - 5.1 mmol/L 3.9 3.9 4.0  Chloride 98 - 111 mmol/L 107 107 106  CO2 22 - 32 mmol/L 24 26 25   Calcium 8.9 - 10.3 mg/dL 9.8 9.3 9.6  Total Protein 6.5 - 8.1 g/dL 6.7 6.6 7.2  Total Bilirubin 0.3 - 1.2 mg/dL 0.6 0.6 0.6  Alkaline Phos 38 - 126 U/L 94 84 91  AST 15 - 41 U/L 17 15 20   ALT 0 - 44 U/L 21 19 19     Hepatic Function Panel Recent Labs    09/02/19 1642 10/12/19 0920 02/16/20 1054  PROT 7.2 6.6 6.7  ALBUMIN 4.1 3.8 3.5  AST 20 15 17   ALT 19 19 21   ALKPHOS 91 84 94  BILITOT 0.6 0.6 0.6   External Labs: Lipid Panel 12/30/2019: total 155, Triglycerides 167, HDL 44, LDL 78  12/30/2019: A1C 5.4% Glucose Random 115 BUN 14, Creatinine, Serum 0.9  03/19/2019: PSA normal   06/05/2018: TSH 1.000 micr   IMPRESSION:    ICD-10-CM   1. Encounter to discuss test results  Z71.2   2. Coronary artery calcification  I25.10 diltiazem (CARDIZEM CD) 120 MG 24 hr capsule   I25.84   3. Mixed hyperlipidemia  E78.2 Lipid Panel With LDL/HDL Ratio  4. Benign hypertension  I10 losartan (COZAAR) 50 MG tablet    hydrochlorothiazide (HYDRODIURIL) 25 MG tablet    Basic metabolic panel    Magnesium  5. Prediabetes  R73.03   6. Former smoker  Z87.891      RECOMMENDATIONS: Daman Steffenhagen. is a 69 y.o. male whose past medical history and cardiac risk factors include: Coronary calcification on nongated CT, prediabetes, hyperlipidemia, hypertension, advanced age, former smoker, obesity due to excess calories.  Encounter to discuss test results:  Patient is EKG at last office visit noted ST depressions in the high lateral and lateral leads.  Echocardiogram noted hyperdynamic LV function with grade 1 diastolic impairment.  And Lexiscan noted small sized, moderate intensity, base to mid inferolateral fixed perfusion defect suggestive of artifact.  However, a degree of reversible ischemia cannot be ruled out.  Discussed undergoing coronary CTA for further evaluation.  However, patient would like to uptitrate his medical therapy for now prior to considering additional testing.  Initiate Cardizem 120 mg p.o. daily given his hyperdynamic LV function.  We will uptitrate his hydrochlorothiazide to 25 mg p.o. daily given the findings of elevated left atrial pressure elevated BP.   Like to see the patient in  close follow-up in 1 month to evaluate symptoms and how he tolerates up titration of medical therapy.  Coronary calcification:  Recommend aspirin 81 mg p.o. daily and statin therapy.   Continue Lipitor 20 mg p.o. nightly.  We will repeat a fasting lipid profile in 6 weeks since initiation of statin therapy.   Echocardiogram and nuclear stress test results were discussed as noted above.  Benign essential hypertension:  Patient's blood pressure at today's office visit is not at goal.  Initiate diltiazem secondary to hyperdynamic LV function.  And uptitrated hydrochlorothiazide secondary to elevated left atrial pressure and elevated blood pressure today's office visit.  Continue losartan 50 mg p.o. daily.  Patient is asked to keep a log of his blood pressures and to review it with provider for further up titration of  medications if clinically warranted.  Mixed hyperlipidemia: Continue lipitor given prediabetes and coronary artery calcification. Check lipid profile to re-evaluate.   Obesity, due to excess calories: Body mass index is 41 kg/m. . I reviewed with the patient the importance of diet, regular physical activity/exercise, weight loss.   . Patient is educated on increasing physical activity gradually as tolerated.  With the goal of moderate intensity exercise for 30 minutes a day 5 days a week.  Former smoker: Educated on the importance of continued smoking cessation.  FINAL MEDICATION LIST END OF ENCOUNTER: Meds ordered this encounter  Medications  . diltiazem (CARDIZEM CD) 120 MG 24 hr capsule    Sig: Take 1 capsule (120 mg total) by mouth daily.    Dispense:  90 capsule    Refill:  0  . losartan (COZAAR) 50 MG tablet    Sig: Take 1 tablet (50 mg total) by mouth every evening.    Dispense:  90 tablet    Refill:  0  . hydrochlorothiazide (HYDRODIURIL) 25 MG tablet    Sig: Take 1 tablet (25 mg total) by mouth in the morning.    Dispense:  90 tablet    Refill:  0     Medications Discontinued During This Encounter  Medication Reason  . atorvastatin (LIPITOR) 20 MG tablet Error  . losartan-hydrochlorothiazide (HYZAAR) 50-12.5 MG tablet Change in therapy     Current Outpatient Medications:  .  abiraterone acetate (ZYTIGA) 250 MG tablet, TAKE 4 TABLETS (1,000 MG TOTAL) BY MOUTH DAILY. TAKE ON AN EMPTY STOMACH, 1 HOUR BEFORE OR 2 HOURS AFTER A MEAL., Disp: 120 tablet, Rfl: 11 .  albuterol (PROVENTIL) (2.5 MG/3ML) 0.083% nebulizer solution, Inhale 2.5 mLs into the lungs 4 (four) times daily as needed for wheezing or shortness of breath., Disp: , Rfl:  .  aspirin EC 81 MG tablet, Take 1 tablet (81 mg total) by mouth daily., Disp: 90 tablet, Rfl: 3 .  atorvastatin (LIPITOR) 20 MG tablet, Take 20 mg by mouth daily., Disp: , Rfl:  .  Multiple Vitamins-Minerals (MULTIVITAMIN WITH MINERALS) tablet, Take 1 tablet by mouth daily., Disp: , Rfl:  .  MYRBETRIQ 25 MG TB24 tablet, Take 25 mg by mouth daily., Disp: , Rfl:  .  predniSONE (DELTASONE) 5 MG tablet, TAKE 1 TABLET BY MOUTH DAILY WITH BREAKFAST, Disp: 30 tablet, Rfl: 2 .  tamsulosin (FLOMAX) 0.4 MG CAPS capsule, Take 1 capsule (0.4 mg total) by mouth daily after supper., Disp: 30 capsule, Rfl: 0 .  diltiazem (CARDIZEM CD) 120 MG 24 hr capsule, Take 1 capsule (120 mg total) by mouth daily., Disp: 90 capsule, Rfl: 0 .  hydrochlorothiazide (HYDRODIURIL) 25 MG tablet, Take 1 tablet (25 mg total) by mouth in the morning., Disp: 90 tablet, Rfl: 0 .  losartan (COZAAR) 50 MG tablet, Take 1 tablet (50 mg total) by mouth every evening., Disp: 90 tablet, Rfl: 0  Orders Placed This Encounter  Procedures  . Lipid Panel With LDL/HDL Ratio  . Basic metabolic panel  . Magnesium   --Continue cardiac medications as reconciled in final medication list. --Return in about 4 weeks (around 03/31/2020) for re-evaluation of symptoms of shortness of breath. Or sooner if needed. --Continue follow-up with your primary care physician  regarding the management of your other chronic comorbid conditions.  Independently reviewed the studies of echo and nuclear stress test with the patient and his wife at today's office visit.  Complex decision making in regards to diagnostic work-up were discussed as  mentioned above.  Patient's questions and concerns were addressed to his satisfaction. He voices understanding of the instructions provided during this encounter.   This note was created using a voice recognition software as a result there may be grammatical errors inadvertently enclosed that do not reflect the nature of this encounter. Every attempt is made to correct such errors.  Rex Kras, [place], [facility]  Pager: [phone] Office: [phone]

## 2020-03-09 DIAGNOSIS — M9904 Segmental and somatic dysfunction of sacral region: Secondary | ICD-10-CM | POA: Diagnosis not present

## 2020-03-09 DIAGNOSIS — M9902 Segmental and somatic dysfunction of thoracic region: Secondary | ICD-10-CM | POA: Diagnosis not present

## 2020-03-16 DIAGNOSIS — I10 Essential (primary) hypertension: Secondary | ICD-10-CM | POA: Diagnosis not present

## 2020-03-16 DIAGNOSIS — E782 Mixed hyperlipidemia: Secondary | ICD-10-CM | POA: Diagnosis not present

## 2020-03-17 ENCOUNTER — Telehealth: Payer: Self-pay

## 2020-03-17 LAB — LIPID PANEL WITH LDL/HDL RATIO
Cholesterol, Total: 122 mg/dL (ref 100–199)
HDL: 45 mg/dL (ref >39)
LDL Chol Calc (NIH): 55 mg/dL (ref 0–99)
LDL/HDL Ratio: 1.2 ratio (ref 0.0–3.6)
Triglycerides: 123 mg/dL (ref 0–149)
VLDL Cholesterol Cal: 22 mg/dL (ref 5–40)

## 2020-03-17 LAB — BASIC METABOLIC PANEL
BUN/Creatinine Ratio: 23 (ref 10–24)
BUN: 21 mg/dL (ref 8–27)
CO2: 24 mmol/L (ref 20–29)
Calcium: 9.8 mg/dL (ref 8.6–10.2)
Chloride: 101 mmol/L (ref 96–106)
Creatinine, Ser: 0.93 mg/dL (ref 0.76–1.27)
GFR calc Af Amer: 97 mL/min/{1.73_m2} (ref >59)
GFR calc non Af Amer: 84 mL/min/{1.73_m2} (ref >59)
Glucose: 128 mg/dL — ABNORMAL HIGH (ref 65–99)
Potassium: 4.1 mmol/L (ref 3.5–5.2)
Sodium: 141 mmol/L (ref 134–144)

## 2020-03-17 LAB — MAGNESIUM: Magnesium: 2.2 mg/dL (ref 1.6–2.3)

## 2020-03-17 NOTE — Telephone Encounter (Signed)
-----   Message from [place], [place] sent at 03/17/2020 12:05 PM EDT ----- Will review the results at the next office visit.

## 2020-03-17 NOTE — Telephone Encounter (Signed)
Discussed with patient that labs were back and would be discussed at follow up visit.

## 2020-03-20 MED FILL — predniSONE 5 MG TABS: 5 | 30 days supply | Qty: 30 | Fill #1

## 2020-03-21 MED FILL — ABIRATERONE ACETATE 250 MG: 250 | 30 days supply | Qty: 120 | Fill #10

## 2020-03-24 DIAGNOSIS — I10 Essential (primary) hypertension: Secondary | ICD-10-CM | POA: Diagnosis not present

## 2020-03-24 DIAGNOSIS — R05 Cough: Secondary | ICD-10-CM | POA: Diagnosis not present

## 2020-03-24 DIAGNOSIS — J209 Acute bronchitis, unspecified: Secondary | ICD-10-CM | POA: Diagnosis not present

## 2020-03-24 DIAGNOSIS — J0100 Acute maxillary sinusitis, unspecified: Secondary | ICD-10-CM | POA: Diagnosis not present

## 2020-03-31 ENCOUNTER — Encounter: Payer: Self-pay | Admitting: Cardiology

## 2020-03-31 ENCOUNTER — Ambulatory Visit: Payer: Medicare HMO | Admitting: Cardiology

## 2020-03-31 ENCOUNTER — Other Ambulatory Visit: Payer: Self-pay

## 2020-03-31 VITALS — BP 125/72 | HR 94 | Ht 66.0 in | Wt 248.0 lb

## 2020-03-31 DIAGNOSIS — R931 Abnormal findings on diagnostic imaging of heart and coronary circulation: Secondary | ICD-10-CM | POA: Diagnosis not present

## 2020-03-31 DIAGNOSIS — I2584 Coronary atherosclerosis due to calcified coronary lesion: Secondary | ICD-10-CM | POA: Diagnosis not present

## 2020-03-31 DIAGNOSIS — I2510 Atherosclerotic heart disease of native coronary artery without angina pectoris: Secondary | ICD-10-CM

## 2020-03-31 DIAGNOSIS — R7303 Prediabetes: Secondary | ICD-10-CM | POA: Diagnosis not present

## 2020-03-31 DIAGNOSIS — I10 Essential (primary) hypertension: Secondary | ICD-10-CM

## 2020-03-31 DIAGNOSIS — E782 Mixed hyperlipidemia: Secondary | ICD-10-CM | POA: Diagnosis not present

## 2020-03-31 DIAGNOSIS — R0609 Other forms of dyspnea: Secondary | ICD-10-CM

## 2020-03-31 DIAGNOSIS — Z87891 Personal history of nicotine dependence: Secondary | ICD-10-CM

## 2020-03-31 DIAGNOSIS — Z712 Person consulting for explanation of examination or test findings: Secondary | ICD-10-CM | POA: Diagnosis not present

## 2020-03-31 MED ORDER — DILTIAZEM HCL ER COATED BEADS 240 MG PO CP24
240.0000 mg | ORAL_CAPSULE | Freq: Every day | ORAL | 0 refills | Status: DC
Start: 2020-03-31 — End: 2020-04-04

## 2020-03-31 NOTE — Progress Notes (Signed)
Date:  03/31/2020   ID:  Bobby Chatters., DOB Oct 02, 1950, MRN [id number]  PCP:  [name], MD  Cardiologist: Rex Kras, DO, [facility] (established care 01/21/2020)  Date: 03/31/20 Last Office Visit: 03/03/2020  Chief Complaint  Patient presents with   Shortness of Breath    Bobby [name]. is a 69 y.o. male who presents to the office with a  chief complaint of " shortness of breath and review test results."  His past medical history and cardiovascular risk factors are: Hypertension, hyperlipidemia, prediabetes, former smoker, advanced age, obesity.   Patient is accompanied today by his wife Bobby [name].   Patient was originally referred to the office for evaluation of  coronary artery calcification.   Given the EKG findings and coronary artery calcification noted on nongated CT study patient was recommended to undergo an echo and nuclear stress test.  Echocardiogram noted preserved left ventricular systolic function with mild valvular heart disease and nuclear stress test pending.  Noted small sized mild intensity reversible perfusion with lifestyle and overall low risk study.  At last visit the plan was to uptitrate current medical therapy to reevaluate symptoms.  Patient's hydrochlorothiazide was uptitrated and Cardizem 120 mg was initiated.  Since last office visit patient states that the does not have any chest pain at rest.  At times he does have mentholated dyspnea which may be secondary to deconditioning.  Patient has lost approximately 6 pounds since last office visit and his blood pressure has improved.  However in regards to blood pressure management there appears to be discrepancy between home blood pressure and office.  Patient will be enrolled into ambulatory blood pressure monitoring for close observation.    No family history of premature coronary disease or sudden cardiac death.  Denies prior history of myocardial infarction, congestive heart failure, deep venous thrombosis,  pulmonary embolism, stroke, transient ischemic attack.  FUNCTIONAL STATUS: Limited due to back pain.    ALLERGIES: No Known Allergies  MEDICATION LIST PRIOR TO VISIT: Current Meds  Medication Sig   abiraterone acetate (ZYTIGA) 250 MG tablet TAKE 4 TABLETS (1,000 MG TOTAL) BY MOUTH DAILY. TAKE ON AN EMPTY STOMACH, 1 HOUR BEFORE OR 2 HOURS AFTER A MEAL.   albuterol (PROVENTIL) (2.5 MG/3ML) 0.083% nebulizer solution Inhale 2.5 mLs into the lungs 4 (four) times daily as needed for wheezing or shortness of breath.   aspirin EC 81 MG tablet Take 1 tablet (81 mg total) by mouth daily.   atorvastatin (LIPITOR) 20 MG tablet Take 20 mg by mouth daily.   hydrochlorothiazide (HYDRODIURIL) 25 MG tablet Take 1 tablet (25 mg total) by mouth in the morning.   losartan (COZAAR) 50 MG tablet Take 1 tablet (50 mg total) by mouth every evening.   Multiple Vitamins-Minerals (MULTIVITAMIN WITH MINERALS) tablet Take 1 tablet by mouth daily.   MYRBETRIQ 25 MG TB24 tablet Take 25 mg by mouth daily.   predniSONE (DELTASONE) 5 MG tablet TAKE 1 TABLET BY MOUTH DAILY WITH BREAKFAST   tamsulosin (FLOMAX) 0.4 MG CAPS capsule Take 1 capsule (0.4 mg total) by mouth daily after supper.   [DISCONTINUED] diltiazem (CARDIZEM CD) 120 MG 24 hr capsule Take 1 capsule (120 mg total) by mouth daily.     PAST MEDICAL HISTORY: Past Medical History:  Diagnosis Date   Bronchitis    Coronary artery calcification of native artery    Family history of pancreatic cancer    History of kidney stones    Hyperlipidemia    Hypertension  Pneumonia    Prostate cancer [facility])     PAST SURGICAL HISTORY: Past Surgical History:  Procedure Laterality Date   EXTRACORPOREAL SHOCK WAVE LITHOTRIPSY Right 04/17/2017   Procedure: RIGHT EXTRACORPOREAL SHOCK WAVE LITHOTRIPSY (ESWL);  Surgeon: Kathie Rhodes, MD;  Location: [facility] ORS;  Service: Urology;  Laterality: Right;   HERNIA REPAIR     age 5   PROSTATE BIOPSY       FAMILY HISTORY: The patient family history includes Breast cancer in his maternal aunt; Breast cancer (age of onset: 54) in his sister; Kidney cancer in his maternal aunt; Pancreatic cancer (age of onset: 71) in his brother.  SOCIAL HISTORY:  The patient  reports that he quit smoking about 8 years ago. His smoking use included cigarettes. He has a 2.50 pack-year smoking history. He has never used smokeless tobacco. He reports that he does not drink alcohol and does not use drugs.  REVIEW OF SYSTEMS: Review of Systems  Constitutional: Negative for chills and fever.  HENT: Negative for hoarse voice and nosebleeds.   Eyes: Negative for discharge, double vision and pain.  Cardiovascular: Negative for chest pain, claudication, dyspnea on exertion, leg swelling, near-syncope, orthopnea, palpitations, paroxysmal nocturnal dyspnea and syncope.  Respiratory: Positive for shortness of breath. Negative for hemoptysis.   Musculoskeletal: Negative for muscle cramps and myalgias.  Gastrointestinal: Negative for abdominal pain, constipation, diarrhea, hematemesis, hematochezia, melena, nausea and vomiting.  Neurological: Negative for dizziness and light-headedness.    PHYSICAL EXAM: Vitals with BMI 03/31/2020 03/03/2020 02/16/2020  Height 5\' 6"  5\' 6"  5\' 6"   Weight 248 lbs 254 lbs 252 lbs 6 oz  BMI 40.05 44.03 47.42  Systolic 595 638 756  Diastolic 72 72 78  Pulse 94 85 76    CONSTITUTIONAL: Well-developed and well-nourished. No acute distress.  SKIN: Skin is warm and dry. No rash noted. No cyanosis. No pallor. No jaundice HEAD: Normocephalic and atraumatic.  EYES: No scleral icterus MOUTH/THROAT: Moist oral membranes.  NECK: No JVD present. No thyromegaly noted. No carotid bruits  LYMPHATIC: No visible cervical adenopathy.  CHEST Normal respiratory effort. No intercostal retractions  LUNGS: Decreased breath sounds at the bases. No stridor. No wheezes. No rales.  CARDIOVASCULAR: Regular rate  and rhythm, positive S1-S2, no murmurs rubs or gallops appreciated. ABDOMINAL: Obese, soft, nontender, nondistended, positive bowel sounds all 4 quadrants. No apparent ascites.  EXTREMITIES: Trace bilateral pitting peripheral edema  HEMATOLOGIC: No significant bruising NEUROLOGIC: Oriented to person, place, and time. Nonfocal. Normal muscle tone.  PSYCHIATRIC: Normal mood and affect. Normal behavior. Cooperative  RADIOLOGY: CT angio chest PE with and without contrast September 02, 2019:Cardiovascular: There is mild cardiomegaly. No pericardial effusion. There is multi vessel coronary vascular calcification. There is mild atherosclerotic calcification of the thoracic aorta.  CARDIAC DATABASE: EKG: 01/21/2020: Normal sinus rhythm, 92 bpm, normal axis, ST depressions in the high lateral and lateral leads, no evidence of myocardial injury pattern.   Echocardiogram: 01/26/2020: Hyperdynamic LV systolic function with visual EF >70%. Intraventricular PG of 31 mm Hg detected.  Left ventricle cavity is normal in size. Moderate concentric hypertrophy of the left ventricle. Normal global wall motion. Doppler evidence of grade I (impaired) diastolic dysfunction, elevated LAP. Mild (Grade I) aortic regurgitation. Mild (Grade I) mitral regurgitation.   Stress Testing: Lexiscan/modified Bruce Tetrofosmin stress test 02/23/2020: Stress EKG showed sinus tachycardia, inferolateral T wave inversion.  SPECT images show small sized, medium intensity, mid to basal inferolateral perfusion defect with mild reversibility. Stress LVEF 82%. Low risk study.  Heart Catheterization: None  LABORATORY DATA: CBC Latest Ref Rng & Units 02/16/2020 10/12/2019 09/02/2019  WBC 4.0 - 10.5 K/uL 7.8 7.1 15.6(H)  Hemoglobin 13.0 - 17.0 g/dL 13.2 14.3 14.8  Hematocrit 39 - 52 % 39.5 43.3 45.2  Platelets 150 - 400 K/uL 233 234 245    CMP Latest Ref Rng & Units 03/16/2020 02/16/2020 10/12/2019  Glucose 65 - 99 mg/dL 128(H) 120(H) 121(H)   BUN 8 - 27 mg/dL 21 20 17   Creatinine 0.76 - 1.27 mg/dL 0.93 0.96 1.07  Sodium 134 - 144 mmol/L 141 142 142  Potassium 3.5 - 5.2 mmol/L 4.1 3.9 3.9  Chloride 96 - 106 mmol/L 101 107 107  CO2 20 - 29 mmol/L 24 24 26   Calcium 8.6 - 10.2 mg/dL 9.8 9.8 9.3  Total Protein 6.5 - 8.1 g/dL - 6.7 6.6  Total Bilirubin 0.3 - 1.2 mg/dL - 0.6 0.6  Alkaline Phos 38 - 126 U/L - 94 84  AST 15 - 41 U/L - 17 15  ALT 0 - 44 U/L - 21 19   Hepatic Function Panel Recent Labs    09/02/19 1642 10/12/19 0920 02/16/20 1054  PROT 7.2 6.6 6.7  ALBUMIN 4.1 3.8 3.5  AST 20 15 17   ALT 19 19 21   ALKPHOS 91 84 94  BILITOT 0.6 0.6 0.6   External Labs: Lipid Panel 12/30/2019: total 155, Triglycerides 167, HDL 44, LDL 78  12/30/2019: A1C 5.4% Glucose Random 115 BUN 14, Creatinine, Serum 0.9  03/19/2019: PSA normal   06/05/2018: TSH 1.000  IMPRESSION:    ICD-10-CM   1. Dyspnea on exertion  R06.00 CT CORONARY MORPH W/CTA COR W/SCORE W/CA W/CM &/OR WO/CM    CT CORONARY FRACTIONAL FLOW RESERVE DATA PREP    CT CORONARY FRACTIONAL FLOW RESERVE FLUID ANALYSIS    Basic metabolic panel  2. Coronary artery calcification  I25.10    I25.84   3. Prediabetes  R73.03   4. Former smoker  Z87.891   5. Mixed hyperlipidemia  E78.2   6. Benign hypertension  I10   7. Encounter to discuss test results  Z71.2   8. Abnormal nuclear cardiac imaging test  R93.1 CT CORONARY MORPH W/CTA COR W/SCORE W/CA W/CM &/OR WO/CM    CT CORONARY FRACTIONAL FLOW RESERVE DATA PREP    CT CORONARY FRACTIONAL FLOW RESERVE FLUID ANALYSIS    Basic metabolic panel     RECOMMENDATIONS: Bobby [name]. is a 69 y.o. male whose past medical history and cardiac risk factors include: Coronary calcification on nongated CT, prediabetes, hyperlipidemia, hypertension, advanced age, former smoker, obesity due to excess calories.  Abnormal nuclear stress test:  Patient was recommended to undergo nuclear stress test given his EKG findings and  coronary calcification on nongated CT study.  At the last office visit patient was informed that his LVEF is preserved with mild valvular heart disease.  His nuclear stress test noted a small sized, mild intensity reversible perfusion defect involving the basal to mid inferolateral segments.  We uptitrated antihypertensive medications and also initiated Cardizem given his hyperdynamic LVEF.  Patient has lost weight since last office visit and his blood pressure is also improved.  Despite that patient continues to have shortness of breath with effort related activities.  We discussed continuing medical therapy versus additional testing such as coronary CTA or left heart catheterization.  The shared decision was to proceed with coronary CTA with FFR to evaluate for obstructive CAD.  Increase Cardizem 240 mg p.o. daily given  his hyperdynamic LV function.  Coronary calcification:  Recommend aspirin 81 mg p.o. daily and statin therapy.   Continue Lipitor 20 mg p.o. nightly.  Repeat lipid profile reviewed with the patient.  LDL is less than 70 mg/dL.  Benign essential hypertension:  Patient's blood pressure at today's office visit is at goal.  Uptitrate diltiazem secondary to hyperdynamic LV function and to improve ventricular rate.  Continue hydrochlorothiazide and losartan.  Independently reviewed the blood work from March 16, 2020 after up titration of HCTZ.   Patient is asked to keep a log of his blood pressures and to review it with provider for further up titration of medications if clinically warranted.  Mixed hyperlipidemia: Continue lipitor given prediabetes and coronary artery calcification.  Independently reviewed the lipid profile with the patient at today's office visit.  LDL currently at goal.  Obesity, due to excess calories: Body mass index is 40.03 kg/m.  I reviewed with the patient the importance of diet, regular physical activity/exercise, weight loss.     Patient is educated on increasing physical activity gradually as tolerated.  With the goal of moderate intensity exercise for 30 minutes a day 5 days a week.  Former smoker: Educated on the importance of continued smoking cessation.  FINAL MEDICATION LIST END OF ENCOUNTER: Meds ordered this encounter  Medications   diltiazem (CARDIZEM CD) 240 MG 24 hr capsule    Sig: Take 1 capsule (240 mg total) by mouth daily.    Dispense:  30 capsule    Refill:  0    Medications Discontinued During This Encounter  Medication Reason   diltiazem (CARDIZEM CD) 120 MG 24 hr capsule Dose change     Current Outpatient Medications:    abiraterone acetate (ZYTIGA) 250 MG tablet, TAKE 4 TABLETS (1,000 MG TOTAL) BY MOUTH DAILY. TAKE ON AN EMPTY STOMACH, 1 HOUR BEFORE OR 2 HOURS AFTER A MEAL., Disp: 120 tablet, Rfl: 11   albuterol (PROVENTIL) (2.5 MG/3ML) 0.083% nebulizer solution, Inhale 2.5 mLs into the lungs 4 (four) times daily as needed for wheezing or shortness of breath., Disp: , Rfl:    aspirin EC 81 MG tablet, Take 1 tablet (81 mg total) by mouth daily., Disp: 90 tablet, Rfl: 3   atorvastatin (LIPITOR) 20 MG tablet, Take 20 mg by mouth daily., Disp: , Rfl:    hydrochlorothiazide (HYDRODIURIL) 25 MG tablet, Take 1 tablet (25 mg total) by mouth in the morning., Disp: 90 tablet, Rfl: 0   losartan (COZAAR) 50 MG tablet, Take 1 tablet (50 mg total) by mouth every evening., Disp: 90 tablet, Rfl: 0   Multiple Vitamins-Minerals (MULTIVITAMIN WITH MINERALS) tablet, Take 1 tablet by mouth daily., Disp: , Rfl:    MYRBETRIQ 25 MG TB24 tablet, Take 25 mg by mouth daily., Disp: , Rfl:    predniSONE (DELTASONE) 5 MG tablet, TAKE 1 TABLET BY MOUTH DAILY WITH BREAKFAST, Disp: 30 tablet, Rfl: 2   tamsulosin (FLOMAX) 0.4 MG CAPS capsule, Take 1 capsule (0.4 mg total) by mouth daily after supper., Disp: 30 capsule, Rfl: 0   diltiazem (CARDIZEM CD) 240 MG 24 hr capsule, Take 1 capsule (240 mg total) by mouth  daily., Disp: 30 capsule, Rfl: 0  Orders Placed This Encounter  Procedures   CT CORONARY MORPH W/CTA COR W/SCORE W/CA W/CM &/OR WO/CM   CT CORONARY FRACTIONAL FLOW RESERVE DATA PREP   CT CORONARY FRACTIONAL FLOW RESERVE FLUID ANALYSIS   Basic metabolic panel   --Continue cardiac medications as reconciled in final medication  list. --Return in about 4 weeks (around 04/28/2020) for after CCTA. Or sooner if needed. --Continue follow-up with your primary care physician regarding the management of your other chronic comorbid conditions.  Independently reviewed the studies of echo and nuclear stress test with the patient and his wife at today's office visit.  Complex decision making in regards to diagnostic work-up were discussed as mentioned above.  Patient's questions and concerns were addressed to his satisfaction. He voices understanding of the instructions provided during this encounter.   This note was created using a voice recognition software as a result there may be grammatical errors inadvertently enclosed that do not reflect the nature of this encounter. Every attempt is made to correct such errors.  Rex Kras, [place], [facility]  Pager: [phone] Office: [phone]

## 2020-04-04 ENCOUNTER — Other Ambulatory Visit: Payer: Self-pay

## 2020-04-04 MED ORDER — DILTIAZEM HCL ER COATED BEADS 240 MG PO CP24: 240.0000 mg | ORAL_CAPSULE | Freq: Every day | ORAL | 3 refills | Status: DC

## 2020-04-08 DIAGNOSIS — I10 Essential (primary) hypertension: Secondary | ICD-10-CM | POA: Diagnosis not present

## 2020-04-09 DIAGNOSIS — I509 Heart failure, unspecified: Secondary | ICD-10-CM

## 2020-04-09 HISTORY — DX: Heart failure, unspecified: I50.9

## 2020-04-18 MED FILL — ABIRATERONE ACETATE 250 MG: 250 | 30 days supply | Qty: 120 | Fill #11

## 2020-04-19 ENCOUNTER — Other Ambulatory Visit: Payer: Self-pay | Admitting: Cardiology

## 2020-04-19 DIAGNOSIS — R931 Abnormal findings on diagnostic imaging of heart and coronary circulation: Secondary | ICD-10-CM | POA: Diagnosis not present

## 2020-04-19 DIAGNOSIS — R0600 Dyspnea, unspecified: Secondary | ICD-10-CM | POA: Diagnosis not present

## 2020-04-20 ENCOUNTER — Telehealth: Payer: Self-pay

## 2020-04-20 LAB — BASIC METABOLIC PANEL
BUN/Creatinine Ratio: 17 (ref 10–24)
BUN: 18 mg/dL (ref 8–27)
CO2: 23 mmol/L (ref 20–29)
Calcium: 9.3 mg/dL (ref 8.6–10.2)
Chloride: 101 mmol/L (ref 96–106)
Creatinine, Ser: 1.07 mg/dL (ref 0.76–1.27)
GFR calc Af Amer: 82 mL/min/{1.73_m2} (ref >59)
GFR calc non Af Amer: 71 mL/min/{1.73_m2} (ref >59)
Glucose: 142 mg/dL — ABNORMAL HIGH (ref 65–99)
Potassium: 4 mmol/L (ref 3.5–5.2)
Sodium: 140 mmol/L (ref 134–144)

## 2020-04-20 NOTE — Telephone Encounter (Signed)
PROGRESS NOTE: Faxed back signed refill request authorization form to [address]. Debe Coder Delanson from Dr Alen Blew.  [phone] Fax came back complete at 10:35am 04/20/20

## 2020-04-21 DIAGNOSIS — M48062 Spinal stenosis, lumbar region with neurogenic claudication: Secondary | ICD-10-CM | POA: Diagnosis not present

## 2020-04-21 DIAGNOSIS — M545 Low back pain: Secondary | ICD-10-CM | POA: Diagnosis not present

## 2020-04-21 DIAGNOSIS — M5417 Radiculopathy, lumbosacral region: Secondary | ICD-10-CM | POA: Diagnosis not present

## 2020-04-24 ENCOUNTER — Telehealth (HOSPITAL_COMMUNITY): Payer: Self-pay | Admitting: Emergency Medicine

## 2020-04-24 ENCOUNTER — Other Ambulatory Visit: Payer: Self-pay | Admitting: Cardiology

## 2020-04-24 DIAGNOSIS — I2510 Atherosclerotic heart disease of native coronary artery without angina pectoris: Secondary | ICD-10-CM

## 2020-04-24 MED ORDER — METOPROLOL TARTRATE 50 MG PO TABS: 50.0000 mg | ORAL_TABLET | Freq: Two times a day (BID) | ORAL | 0 refills | Status: DC

## 2020-04-24 NOTE — Progress Notes (Signed)
Patient is enrolled into ambulatory blood pressure monitoring. Patient's pulse at home ranges between 60 to 85 bpm. We will prescribe Lopressor 50 mg p.o. twice daily prior to the upcoming coronary CTA. Patient made aware the prescription sent to [facility].   Rex Kras, DO, [facility]  Pager: [phone] Office: [phone]

## 2020-04-24 NOTE — Telephone Encounter (Signed)
Reaching out to patient to offer assistance regarding upcoming cardiac imaging study; pt verbalizes understanding of appt date/time, parking situation and where to check in, pre-test NPO status and medications ordered, and verified current allergies; name and call back number provided for further questions should they arise Bonnie Overdorf RN Navigator Cardiac Imaging [facility] [phone] office [phone] cell 

## 2020-04-25 NOTE — Progress Notes (Signed)
Left voicemail with patient.

## 2020-04-26 ENCOUNTER — Ambulatory Visit (HOSPITAL_COMMUNITY)
Admission: RE | Admit: 2020-04-26 | Discharge: 2020-04-26 | Disposition: A | Discharge status: Home / Self Care | Payer: Medicare HMO | Source: Ambulatory Visit | Attending: Cardiology | Admitting: Cardiology

## 2020-04-26 DIAGNOSIS — I700 Atherosclerosis of aorta: Secondary | ICD-10-CM | POA: Diagnosis not present

## 2020-04-26 DIAGNOSIS — R931 Abnormal findings on diagnostic imaging of heart and coronary circulation: Secondary | ICD-10-CM

## 2020-04-26 DIAGNOSIS — R0609 Other forms of dyspnea: Secondary | ICD-10-CM

## 2020-04-26 DIAGNOSIS — R0600 Dyspnea, unspecified: Secondary | ICD-10-CM | POA: Insufficient documentation

## 2020-04-26 DIAGNOSIS — R0602 Shortness of breath: Secondary | ICD-10-CM | POA: Diagnosis not present

## 2020-04-26 MED ORDER — NITROGLYCERIN 0.4 MG SL SUBL
0.8000 mg | SUBLINGUAL_TABLET | Freq: Once | SUBLINGUAL | Status: AC
  Administered 2020-04-26: 0.8 mg via SUBLINGUAL

## 2020-04-26 MED ORDER — IOHEXOL 350 MG/ML SOLN
90.0000 mL | Freq: Once | INTRAVENOUS | Status: AC | PRN
  Administered 2020-04-26: 90 mL via INTRAVENOUS

## 2020-04-26 MED ORDER — NITROGLYCERIN 0.4 MG SL SUBL
SUBLINGUAL_TABLET | SUBLINGUAL | Status: AC
  Filled 2020-04-26: qty 2

## 2020-04-26 NOTE — Progress Notes (Signed)
CT scan completed. Tolerated well. D/C home with wife, ambulatory. Awake and alert. In no distress

## 2020-04-26 NOTE — Progress Notes (Signed)
Spoke with patient. Patient voiced understanding. Patient is getting Coronary CTA today.

## 2020-04-27 ENCOUNTER — Ambulatory Visit (HOSPITAL_COMMUNITY)
Admission: RE | Admit: 2020-04-27 | Discharge: 2020-04-27 | Disposition: A | Discharge status: Home / Self Care | Payer: Medicare HMO | Source: Ambulatory Visit | Attending: Cardiology | Admitting: Cardiology

## 2020-04-27 DIAGNOSIS — R0600 Dyspnea, unspecified: Secondary | ICD-10-CM | POA: Diagnosis not present

## 2020-04-27 DIAGNOSIS — R0609 Other forms of dyspnea: Secondary | ICD-10-CM

## 2020-04-27 DIAGNOSIS — R931 Abnormal findings on diagnostic imaging of heart and coronary circulation: Secondary | ICD-10-CM | POA: Diagnosis present

## 2020-04-28 DIAGNOSIS — R69 Illness, unspecified: Secondary | ICD-10-CM | POA: Diagnosis not present

## 2020-05-01 ENCOUNTER — Other Ambulatory Visit: Payer: Self-pay

## 2020-05-01 ENCOUNTER — Ambulatory Visit: Payer: Medicare HMO | Admitting: Cardiology

## 2020-05-01 ENCOUNTER — Encounter: Payer: Self-pay | Admitting: Cardiology

## 2020-05-01 VITALS — BP 145/66 | HR 68 | Resp 16 | Ht 66.0 in | Wt 249.0 lb

## 2020-05-01 DIAGNOSIS — R0609 Other forms of dyspnea: Secondary | ICD-10-CM | POA: Diagnosis not present

## 2020-05-01 DIAGNOSIS — Z87891 Personal history of nicotine dependence: Secondary | ICD-10-CM | POA: Diagnosis not present

## 2020-05-01 DIAGNOSIS — E66813 Obesity, class 3: Secondary | ICD-10-CM

## 2020-05-01 DIAGNOSIS — Z6841 Body Mass Index (BMI) 40.0 and over, adult: Secondary | ICD-10-CM

## 2020-05-01 DIAGNOSIS — R7303 Prediabetes: Secondary | ICD-10-CM

## 2020-05-01 DIAGNOSIS — I10 Essential (primary) hypertension: Secondary | ICD-10-CM | POA: Diagnosis not present

## 2020-05-01 DIAGNOSIS — I2510 Atherosclerotic heart disease of native coronary artery without angina pectoris: Secondary | ICD-10-CM

## 2020-05-01 DIAGNOSIS — I2584 Coronary atherosclerosis due to calcified coronary lesion: Secondary | ICD-10-CM

## 2020-05-01 DIAGNOSIS — Z712 Person consulting for explanation of examination or test findings: Secondary | ICD-10-CM | POA: Diagnosis not present

## 2020-05-01 DIAGNOSIS — E782 Mixed hyperlipidemia: Secondary | ICD-10-CM | POA: Diagnosis not present

## 2020-05-01 NOTE — Progress Notes (Signed)
Date:  05/01/2020   ID:  Bobby Chatters., DOB December 31, 1950, MRN [id number]  PCP:  [name], MD  Cardiologist: Rex Kras, DO, [facility] (established care 01/21/2020)  Date: 05/01/20 Last Office Visit: 03/03/2020  Chief Complaint  Patient presents with  . Shortness of Breath  . Follow-up    4 week  . Results    CCTA    Bobby Chatters. is a 69 y.o. male who presents to the office with a  chief complaint of " shortness of breath and review test results."  His past medical history and cardiovascular risk factors are: Coronary artery disease, severe coronary artery calcification 1101 AU, prediabetes, hyperlipidemia, hypertension, advanced age, former smoker, obesity due to excess calories.  Patient is accompanied today by his wife Bobby [name].   Patient was originally referred to the office for evaluation of  coronary artery calcification.   Given the EKG findings and coronary artery calcification noted on nongated CT study patient was recommended to undergo an echo and nuclear stress test.  Echocardiogram noted preserved left ventricular systolic function with mild valvular heart disease and nuclear stress test pending.  Noted small sized mild intensity reversible perfusion with lifestyle and overall low risk study.  His medical therapy was uptitrated and continued to have dyspnea on exertion which he was contributing to deconditioning.  But given the results of the stress test the shared decision was to proceed with coronary CTA. Results of coronary CCTA reviewed with the patient and his wife at today's visit including CCTA images.  Patient's total coronary artery calcification score is 1101 and he is noted to have multivessel CAD with both calcified and noncalcified plaque.  Patient's coronary CTA was sent to CT FFR for further evaluation and it did not show any significant stenosis.  I did inform the patient that the degree of stenosis on the coronary CTA may be overestimated due to blooming  artifact from the coronary calcification.  No family history of premature coronary disease or sudden cardiac death.  Denies prior history of myocardial infarction, congestive heart failure, deep venous thrombosis, pulmonary embolism, stroke, transient ischemic attack.  FUNCTIONAL STATUS: Limited due to back pain.    ALLERGIES: No Known Allergies  MEDICATION LIST PRIOR TO VISIT: Current Meds  Medication Sig  . abiraterone acetate (ZYTIGA) 250 MG tablet TAKE 4 TABLETS (1,000 MG TOTAL) BY MOUTH DAILY. TAKE ON AN EMPTY STOMACH, 1 HOUR BEFORE OR 2 HOURS AFTER A MEAL.  Marland Kitchen aspirin EC 81 MG tablet Take 1 tablet (81 mg total) by mouth daily.  Marland Kitchen atorvastatin (LIPITOR) 20 MG tablet Take 20 mg by mouth daily.  Marland Kitchen diltiazem (CARDIZEM CD) 240 MG 24 hr capsule Take 1 capsule (240 mg total) by mouth daily.  . hydrochlorothiazide (HYDRODIURIL) 25 MG tablet Take 1 tablet (25 mg total) by mouth in the morning.  Marland Kitchen losartan (COZAAR) 50 MG tablet Take 1 tablet (50 mg total) by mouth every evening.  . meloxicam (MOBIC) 7.5 MG tablet Take 7.5 mg by mouth 2 (two) times daily as needed.  . methocarbamol (ROBAXIN) 500 MG tablet Take 1 tablet by mouth daily as needed.  . Multiple Vitamins-Minerals (MULTIVITAMIN WITH MINERALS) tablet Take 1 tablet by mouth daily.  Marland Kitchen MYRBETRIQ 25 MG TB24 tablet Take 25 mg by mouth daily.  . predniSONE (DELTASONE) 5 MG tablet TAKE 1 TABLET BY MOUTH DAILY WITH BREAKFAST  . solifenacin (VESICARE) 5 MG tablet Take 5 mg by mouth daily.  . tamsulosin (FLOMAX) 0.4 MG CAPS capsule  Take 1 capsule (0.4 mg total) by mouth daily after supper.  . traMADol (ULTRAM) 50 MG tablet Take 50 mg by mouth 2 (two) times daily as needed.     PAST MEDICAL HISTORY: Past Medical History:  Diagnosis Date  . Bronchitis   . Coronary artery calcification of native artery   . Family history of pancreatic cancer   . History of kidney stones   . Hyperlipidemia   . Hypertension   . Pneumonia   . Prostate  cancer ([place])     PAST SURGICAL HISTORY: Past Surgical History:  Procedure Laterality Date  . EXTRACORPOREAL SHOCK WAVE LITHOTRIPSY Right 04/17/2017   Procedure: RIGHT EXTRACORPOREAL SHOCK WAVE LITHOTRIPSY (ESWL);  Surgeon: Kathie Rhodes, MD;  Location: [facility];  Service: Urology;  Laterality: Right;  . HERNIA REPAIR     age 76  . PROSTATE BIOPSY      FAMILY HISTORY: The patient family history includes Breast cancer in his maternal aunt; Breast cancer (age of onset: 71) in his sister; Kidney cancer in his maternal aunt; Pancreatic cancer (age of onset: 73) in his brother.  SOCIAL HISTORY:  The patient  reports that he quit smoking about 8 years ago. His smoking use included cigarettes. He has a 2.50 pack-year smoking history. He has never used smokeless tobacco. He reports that he does not drink alcohol and does not use drugs.  REVIEW OF SYSTEMS: Review of Systems  Constitutional: Negative for chills and fever.  HENT: Negative for hoarse voice and nosebleeds.   Eyes: Negative for discharge, double vision and pain.  Cardiovascular: Negative for chest pain, claudication, dyspnea on exertion, leg swelling, near-syncope, orthopnea, palpitations, paroxysmal nocturnal dyspnea and syncope.  Respiratory: Positive for shortness of breath. Negative for hemoptysis.   Musculoskeletal: Negative for muscle cramps and myalgias.  Gastrointestinal: Negative for abdominal pain, constipation, diarrhea, hematemesis, hematochezia, melena, nausea and vomiting.  Neurological: Negative for dizziness and light-headedness.    PHYSICAL EXAM: Vitals with BMI 05/01/2020 04/26/2020 04/26/2020  Height 5\' 6"  - -  Weight 249 lbs - -  BMI 00.17 - -  Systolic 494 496 759  Diastolic 66 60 73  Pulse 68 52 56    CONSTITUTIONAL: Well-developed and well-nourished. No acute distress.  SKIN: Skin is warm and dry. No rash noted. No cyanosis. No pallor. No jaundice HEAD: Normocephalic and atraumatic.  EYES: No scleral  icterus MOUTH/THROAT: Moist oral membranes.  NECK: No JVD present. No thyromegaly noted. No carotid bruits  LYMPHATIC: No visible cervical adenopathy.  CHEST Normal respiratory effort. No intercostal retractions  LUNGS: Decreased breath sounds at the bases. No stridor. No wheezes. No rales.  CARDIOVASCULAR: Regular rate and rhythm, positive S1-S2, no murmurs rubs or gallops appreciated. ABDOMINAL: Obese, soft, nontender, nondistended, positive bowel sounds all 4 quadrants. No apparent ascites.  EXTREMITIES: Trace bilateral pitting peripheral edema  HEMATOLOGIC: No significant bruising NEUROLOGIC: Oriented to person, place, and time. Nonfocal. Normal muscle tone.  PSYCHIATRIC: Normal mood and affect. Normal behavior. Cooperative  RADIOLOGY: CT angio chest PE with and without contrast September 02, 2019:Cardiovascular: There is mild cardiomegaly. No pericardial effusion. There is multi vessel coronary vascular calcification. There is mild atherosclerotic calcification of the thoracic aorta.  CARDIAC DATABASE: EKG: 01/21/2020: Normal sinus rhythm, 92 bpm, normal axis, ST depressions in the high lateral and lateral leads, no evidence of myocardial injury pattern.   Echocardiogram: 01/26/2020: Hyperdynamic LV systolic function with visual EF >70%. Intraventricular PG of 31 mm Hg detected.  Left ventricle cavity is normal in  size. Moderate concentric hypertrophy of the left ventricle. Normal global wall motion. Doppler evidence of grade I (impaired) diastolic dysfunction, elevated LAP. Mild (Grade I) aortic regurgitation. Mild (Grade I) mitral regurgitation.   Stress Testing: Lexiscan/modified Bruce Tetrofosmin stress test 02/23/2020: Stress EKG showed sinus tachycardia, inferolateral T wave inversion.  SPECT images show small sized, medium intensity, mid to basal inferolateral perfusion defect with mild reversibility. Stress LVEF 82%. Low risk study.  Coronary CTA 04/26/2020: 1. Coronary  calcium score of 1101. This was 89th percentile for age and sex matched control. 2. Normal coronary origin with right dominance. 3.  Minimal calcified plaque within the left main artery. Moderate to severe stenosis within the proximal/mid LAD segment to calcified plaque. Severe stenosis at the ostial segment of the 1st diagonal branch due calcified plaque. Severe stenosis within the proximal segment due to eccentric calcified plaque within the LCX. Moderate stenosis within the distal RCA due to calcified plaque. Of note, severity of the stenosis may be overestimated due to blooming artifact caused by calcified plaque. 4. CADRADS = 4. Cardiac catheterization or CT FFR is recommended. Consider symptom-guided anti-ischemic pharmacotherapy as well as risk factor modification per guideline directed care. Invasive coronary angiography recommended with revascularization per published guideline statements. 4. Study is sent for CT-FFR findings will be performed and reported   CT-FFR 04/27/2020: CT FFR analysis showed no significant stenosis.  Heart Catheterization: None  LABORATORY DATA: CBC Latest Ref Rng & Units 02/16/2020 10/12/2019 09/02/2019  WBC 4.0 - 10.5 K/uL 7.8 7.1 15.6(H)  Hemoglobin 13.0 - 17.0 g/dL 13.2 14.3 14.8  Hematocrit 39 - 52 % 39.5 43.3 45.2  Platelets 150 - 400 K/uL 233 234 245    CMP Latest Ref Rng & Units 04/19/2020 03/16/2020 02/16/2020  Glucose 65 - 99 mg/dL 142(H) 128(H) 120(H)  BUN 8 - 27 mg/dL 18 21 20   Creatinine 0.76 - 1.27 mg/dL 1.07 0.93 0.96  Sodium 134 - 144 mmol/L 140 141 142  Potassium 3.5 - 5.2 mmol/L 4.0 4.1 3.9  Chloride 96 - 106 mmol/L 101 101 107  CO2 20 - 29 mmol/L 23 24 24   Calcium 8.6 - 10.2 mg/dL 9.3 9.8 9.8  Total Protein 6.5 - 8.1 g/dL - - 6.7  Total Bilirubin 0.3 - 1.2 mg/dL - - 0.6  Alkaline Phos 38 - 126 U/L - - 94  AST 15 - 41 U/L - - 17  ALT 0 - 44 U/L - - 21   Hepatic Function Panel Recent Labs    09/02/19 1642 10/12/19 0920  02/16/20 1054  PROT 7.2 6.6 6.7  ALBUMIN 4.1 3.8 3.5  AST 20 15 17   ALT 19 19 21   ALKPHOS 91 84 94  BILITOT 0.6 0.6 0.6   External Labs: Lipid Panel 12/30/2019: total 155, Triglycerides 167, HDL 44, LDL 78  12/30/2019: A1C 5.4% Glucose Random 115 BUN 14, Creatinine, Serum 0.9  03/19/2019: PSA normal   06/05/2018: TSH 1.000  IMPRESSION:    ICD-10-CM   1. Coronary atherosclerosis due to calcified coronary lesion of native artery  I25.10    I25.84   2. Coronary artery calcification  I25.10    I25.84   3. Dyspnea on exertion  R06.00   4. Prediabetes  R73.03   5. Former smoker  Z87.891   26. Mixed hyperlipidemia  E78.2   7. Benign hypertension  I10   8. Encounter to discuss test results  Z71.2   9. Class 3 severe obesity due to excess calories with serious comorbidity  and body mass index (BMI) of 40.0 to 44.9 in adult [facility])  E66.01    Z68.41      RECOMMENDATIONS: Bobby [name]. is a 69 y.o. male whose past medical history and cardiac risk factors include: Coronary artery disease, severe coronary artery calcification 1101 AU, prediabetes, hyperlipidemia, hypertension, advanced age, former smoker, obesity due to excess calories.  Coronary atherosclerosis due to calcified coronary lesions in the native artery without angina pectoris:  Continue aspirin.  Continue statin therapy. LDL is less than 70mg  /dL.   Patient does not have chest pain at rest or with effort related activity. His shortness of breath with effort related dyspnea remains stable.  Encouraged him to increase his physical to 30 minutes a day 5 days with his wife as tolerated.   Educated on improving his modifiable cardiovascular risk factors.   Reviewed the results of CCTA with the patient and his wife at today's visit, including images.   Coronary calcification:  Recommend aspirin 81 mg p.o. daily and statin therapy.   Continue Lipitor 20 mg p.o. nightly.  Repeat lipid profile reviewed with the  patient.  LDL is less than 70 mg/dL.  Benign essential hypertension:  Patient's blood pressure at today's office visit is not at goal.  But he is enrolled into ambulatory blood pressure monitoring and BP log reviewed.   Uptitrate diltiazem secondary to hyperdynamic LV function and to improve ventricular rate.  Continue hydrochlorothiazide and losartan.  Low salt diet recommended.   Mixed hyperlipidemia: Continue lipitor given prediabetes and coronary artery disease.  Independently reviewed the lipid profile with the patient at today's office visit.  LDL currently at goal.  Obesity, due to excess calories: Body mass index is 40.19 kg/m. . I reviewed with the patient the importance of diet, regular physical activity/exercise, weight loss.   . Patient is educated on increasing physical activity gradually as tolerated.  With the goal of moderate intensity exercise for 30 minutes a day 5 days a week.  Former smoker: Educated on the importance of continued smoking cessation.  FINAL MEDICATION LIST END OF ENCOUNTER: No orders of the defined types were placed in this encounter.   Current Outpatient Medications:  .  abiraterone acetate (ZYTIGA) 250 MG tablet, TAKE 4 TABLETS (1,000 MG TOTAL) BY MOUTH DAILY. TAKE ON AN EMPTY STOMACH, 1 HOUR BEFORE OR 2 HOURS AFTER A MEAL., Disp: 120 tablet, Rfl: 11 .  aspirin EC 81 MG tablet, Take 1 tablet (81 mg total) by mouth daily., Disp: 90 tablet, Rfl: 3 .  atorvastatin (LIPITOR) 20 MG tablet, Take 20 mg by mouth daily., Disp: , Rfl:  .  diltiazem (CARDIZEM CD) 240 MG 24 hr capsule, Take 1 capsule (240 mg total) by mouth daily., Disp: 30 capsule, Rfl: 3 .  hydrochlorothiazide (HYDRODIURIL) 25 MG tablet, Take 1 tablet (25 mg total) by mouth in the morning., Disp: 90 tablet, Rfl: 0 .  losartan (COZAAR) 50 MG tablet, Take 1 tablet (50 mg total) by mouth every evening., Disp: 90 tablet, Rfl: 0 .  meloxicam (MOBIC) 7.5 MG tablet, Take 7.5 mg by mouth 2 (two)  times daily as needed., Disp: , Rfl:  .  methocarbamol (ROBAXIN) 500 MG tablet, Take 1 tablet by mouth daily as needed., Disp: , Rfl:  .  Multiple Vitamins-Minerals (MULTIVITAMIN WITH MINERALS) tablet, Take 1 tablet by mouth daily., Disp: , Rfl:  .  MYRBETRIQ 25 MG TB24 tablet, Take 25 mg by mouth daily., Disp: , Rfl:  .  predniSONE (DELTASONE)  5 MG tablet, TAKE 1 TABLET BY MOUTH DAILY WITH BREAKFAST, Disp: 30 tablet, Rfl: 2 .  solifenacin (VESICARE) 5 MG tablet, Take 5 mg by mouth daily., Disp: , Rfl:  .  tamsulosin (FLOMAX) 0.4 MG CAPS capsule, Take 1 capsule (0.4 mg total) by mouth daily after supper., Disp: 30 capsule, Rfl: 0 .  traMADol (ULTRAM) 50 MG tablet, Take 50 mg by mouth 2 (two) times daily as needed., Disp: , Rfl:   No orders of the defined types were placed in this encounter.  --Continue cardiac medications as reconciled in final medication list. --Return in about 6 months (around 11/01/2020) for CAD follow up. . Or sooner if needed. --Continue follow-up with your primary care physician regarding the management of your other chronic comorbid conditions.  Independently reviewed the studies of echo and nuclear stress test with the patient and his wife at today's office visit.  Complex decision making in regards to diagnostic work-up were discussed as mentioned above.  Patient's questions and concerns were addressed to his satisfaction. He voices understanding of the instructions provided during this encounter.   This note was created using a voice recognition software as a result there may be grammatical errors inadvertently enclosed that do not reflect the nature of this encounter. Every attempt is made to correct such errors.  Total time spent: 37 minutes.   Rex Kras, [place], [facility]  Pager: [phone] Office: [phone]

## 2020-05-05 ENCOUNTER — Emergency Department (HOSPITAL_BASED_OUTPATIENT_CLINIC_OR_DEPARTMENT_OTHER): Payer: Medicare HMO

## 2020-05-05 ENCOUNTER — Other Ambulatory Visit: Payer: Self-pay

## 2020-05-05 ENCOUNTER — Encounter (HOSPITAL_BASED_OUTPATIENT_CLINIC_OR_DEPARTMENT_OTHER): Payer: Self-pay | Admitting: Emergency Medicine

## 2020-05-05 ENCOUNTER — Telehealth: Payer: Self-pay

## 2020-05-05 ENCOUNTER — Inpatient Hospital Stay (HOSPITAL_BASED_OUTPATIENT_CLINIC_OR_DEPARTMENT_OTHER)
Admission: EM | Admit: 2020-05-05 | Discharge: 2020-05-08 | DRG: 286 | Disposition: A | Discharge status: Home / Self Care | Payer: Medicare HMO | Attending: Internal Medicine | Admitting: Internal Medicine

## 2020-05-05 DIAGNOSIS — R0603 Acute respiratory distress: Secondary | ICD-10-CM | POA: Diagnosis not present

## 2020-05-05 DIAGNOSIS — Z87891 Personal history of nicotine dependence: Secondary | ICD-10-CM

## 2020-05-05 DIAGNOSIS — E876 Hypokalemia: Secondary | ICD-10-CM | POA: Diagnosis not present

## 2020-05-05 DIAGNOSIS — I4891 Unspecified atrial fibrillation: Secondary | ICD-10-CM | POA: Diagnosis present

## 2020-05-05 DIAGNOSIS — Z7952 Long term (current) use of systemic steroids: Secondary | ICD-10-CM | POA: Diagnosis not present

## 2020-05-05 DIAGNOSIS — Z7982 Long term (current) use of aspirin: Secondary | ICD-10-CM

## 2020-05-05 DIAGNOSIS — I472 Ventricular tachycardia: Secondary | ICD-10-CM | POA: Diagnosis not present

## 2020-05-05 DIAGNOSIS — Z6841 Body Mass Index (BMI) 40.0 and over, adult: Secondary | ICD-10-CM | POA: Diagnosis not present

## 2020-05-05 DIAGNOSIS — R7303 Prediabetes: Secondary | ICD-10-CM | POA: Diagnosis present

## 2020-05-05 DIAGNOSIS — I5033 Acute on chronic diastolic (congestive) heart failure: Secondary | ICD-10-CM | POA: Diagnosis present

## 2020-05-05 DIAGNOSIS — J90 Pleural effusion, not elsewhere classified: Secondary | ICD-10-CM

## 2020-05-05 DIAGNOSIS — I5031 Acute diastolic (congestive) heart failure: Secondary | ICD-10-CM | POA: Diagnosis not present

## 2020-05-05 DIAGNOSIS — I110 Hypertensive heart disease with heart failure: Principal | ICD-10-CM | POA: Diagnosis present

## 2020-05-05 DIAGNOSIS — J9811 Atelectasis: Secondary | ICD-10-CM | POA: Diagnosis present

## 2020-05-05 DIAGNOSIS — J189 Pneumonia, unspecified organism: Secondary | ICD-10-CM

## 2020-05-05 DIAGNOSIS — R0602 Shortness of breath: Secondary | ICD-10-CM | POA: Diagnosis not present

## 2020-05-05 DIAGNOSIS — I509 Heart failure, unspecified: Secondary | ICD-10-CM

## 2020-05-05 DIAGNOSIS — Z79899 Other long term (current) drug therapy: Secondary | ICD-10-CM

## 2020-05-05 DIAGNOSIS — I25119 Atherosclerotic heart disease of native coronary artery with unspecified angina pectoris: Secondary | ICD-10-CM | POA: Diagnosis not present

## 2020-05-05 DIAGNOSIS — D72829 Elevated white blood cell count, unspecified: Secondary | ICD-10-CM | POA: Diagnosis present

## 2020-05-05 DIAGNOSIS — I25110 Atherosclerotic heart disease of native coronary artery with unstable angina pectoris: Secondary | ICD-10-CM | POA: Diagnosis not present

## 2020-05-05 DIAGNOSIS — I2510 Atherosclerotic heart disease of native coronary artery without angina pectoris: Secondary | ICD-10-CM | POA: Diagnosis present

## 2020-05-05 DIAGNOSIS — C61 Malignant neoplasm of prostate: Secondary | ICD-10-CM | POA: Diagnosis not present

## 2020-05-05 DIAGNOSIS — Z20822 Contact with and (suspected) exposure to covid-19: Secondary | ICD-10-CM | POA: Diagnosis present

## 2020-05-05 DIAGNOSIS — E782 Mixed hyperlipidemia: Secondary | ICD-10-CM | POA: Diagnosis present

## 2020-05-05 DIAGNOSIS — R079 Chest pain, unspecified: Secondary | ICD-10-CM

## 2020-05-05 DIAGNOSIS — R9431 Abnormal electrocardiogram [ECG] [EKG]: Secondary | ICD-10-CM | POA: Diagnosis not present

## 2020-05-05 DIAGNOSIS — I10 Essential (primary) hypertension: Secondary | ICD-10-CM | POA: Diagnosis not present

## 2020-05-05 DIAGNOSIS — R0789 Other chest pain: Secondary | ICD-10-CM | POA: Diagnosis not present

## 2020-05-05 DIAGNOSIS — J181 Lobar pneumonia, unspecified organism: Secondary | ICD-10-CM | POA: Diagnosis not present

## 2020-05-05 LAB — COMPREHENSIVE METABOLIC PANEL
ALT: 15 U/L (ref 0–44)
AST: 16 U/L (ref 15–41)
Albumin: 3.5 g/dL (ref 3.5–5.0)
Alkaline Phosphatase: 72 U/L (ref 38–126)
Anion gap: 10 (ref 5–15)
BUN: 20 mg/dL (ref 8–23)
CO2: 25 mmol/L (ref 22–32)
Calcium: 9 mg/dL (ref 8.9–10.3)
Chloride: 102 mmol/L (ref 98–111)
Creatinine, Ser: 0.88 mg/dL (ref 0.61–1.24)
GFR calc Af Amer: >60 mL/min (ref >60)
GFR calc non Af Amer: >60 mL/min (ref >60)
Glucose, Bld: 159 mg/dL — ABNORMAL HIGH (ref 70–99)
Potassium: 3.3 mmol/L — ABNORMAL LOW (ref 3.5–5.1)
Sodium: 137 mmol/L (ref 135–145)
Total Bilirubin: 0.6 mg/dL (ref 0.3–1.2)
Total Protein: 7 g/dL (ref 6.5–8.1)

## 2020-05-05 LAB — CBC WITH DIFFERENTIAL/PLATELET
Abs Immature Granulocytes: 0.07 10*3/uL (ref 0.00–0.07)
Basophils Absolute: 0 10*3/uL (ref 0.0–0.1)
Basophils Relative: 0 %
Eosinophils Absolute: 0.1 10*3/uL (ref 0.0–0.5)
Eosinophils Relative: 0 %
HCT: 38 % — ABNORMAL LOW (ref 39.0–52.0)
Hemoglobin: 12.6 g/dL — ABNORMAL LOW (ref 13.0–17.0)
Immature Granulocytes: 1 %
Lymphocytes Relative: 4 %
Lymphs Abs: 0.6 10*3/uL — ABNORMAL LOW (ref 0.7–4.0)
MCH: 28.6 pg (ref 26.0–34.0)
MCHC: 33.2 g/dL (ref 30.0–36.0)
MCV: 86.2 fL (ref 80.0–100.0)
Monocytes Absolute: 0.8 10*3/uL (ref 0.1–1.0)
Monocytes Relative: 6 %
Neutro Abs: 12.7 10*3/uL — ABNORMAL HIGH (ref 1.7–7.7)
Neutrophils Relative %: 89 %
Platelets: 235 10*3/uL (ref 150–400)
RBC: 4.41 MIL/uL (ref 4.22–5.81)
RDW: 14.9 % (ref 11.5–15.5)
WBC: 14.3 10*3/uL — ABNORMAL HIGH (ref 4.0–10.5)
nRBC: 0 % (ref 0.0–0.2)

## 2020-05-05 LAB — TROPONIN I (HIGH SENSITIVITY)
Troponin I (High Sensitivity): 15 ng/L (ref <18)
Troponin I (High Sensitivity): 14 ng/L (ref <18)

## 2020-05-05 LAB — MAGNESIUM: Magnesium: 1.9 mg/dL (ref 1.7–2.4)

## 2020-05-05 LAB — SARS CORONAVIRUS 2 BY RT PCR (HOSPITAL ORDER, PERFORMED IN ~~LOC~~ HOSPITAL LAB): SARS Coronavirus 2: NEGATIVE

## 2020-05-05 LAB — BRAIN NATRIURETIC PEPTIDE: B Natriuretic Peptide: 347.5 pg/mL — ABNORMAL HIGH (ref 0.0–100.0)

## 2020-05-05 MED ORDER — CEPHALEXIN 500 MG PO CAPS
500.0000 mg | ORAL_CAPSULE | Freq: Three times a day (TID) | ORAL | Status: DC
  Administered 2020-05-05 (×2): 500 mg via ORAL
  Filled 2020-05-05: qty 1
  Filled 2020-05-05: qty 2

## 2020-05-05 MED ORDER — DILTIAZEM HCL ER COATED BEADS 240 MG PO CP24
240.0000 mg | ORAL_CAPSULE | Freq: Every day | ORAL | Status: DC
  Administered 2020-05-05 – 2020-05-06 (×2): 240 mg via ORAL
  Filled 2020-05-05: qty 1
  Filled 2020-05-05: qty 2

## 2020-05-05 MED ORDER — FUROSEMIDE 10 MG/ML IJ SOLN
40.0000 mg | Freq: Once | INTRAMUSCULAR | Status: AC
  Administered 2020-05-05: 40 mg via INTRAVENOUS
  Filled 2020-05-05: qty 4

## 2020-05-05 MED ORDER — ASPIRIN EC 81 MG PO TBEC: 81.0000 mg | DELAYED_RELEASE_TABLET | Freq: Every day | ORAL | Status: DC

## 2020-05-05 MED ORDER — FENTANYL CITRATE (PF) 100 MCG/2ML IJ SOLN
50.0000 ug | Freq: Once | INTRAMUSCULAR | Status: AC
  Administered 2020-05-05: 50 ug via INTRAVENOUS
  Filled 2020-05-05: qty 2

## 2020-05-05 MED ORDER — ASPIRIN 81 MG PO CHEW
324.0000 mg | CHEWABLE_TABLET | Freq: Once | ORAL | Status: AC
  Administered 2020-05-05: 324 mg via ORAL
  Filled 2020-05-05: qty 4

## 2020-05-05 MED ORDER — IOHEXOL 350 MG/ML SOLN
100.0000 mL | Freq: Once | INTRAVENOUS | Status: AC | PRN
  Administered 2020-05-05: 89 mL via INTRAVENOUS

## 2020-05-05 MED ORDER — ABIRATERONE ACETATE 250 MG PO TABS
1000.0000 mg | ORAL_TABLET | Freq: Once | ORAL | Status: AC
  Administered 2020-05-05: 1000 mg via ORAL
  Filled 2020-05-05: qty 4

## 2020-05-05 MED ORDER — HYDROCHLOROTHIAZIDE 25 MG PO TABS: 25.0000 mg | ORAL_TABLET | Freq: Every morning | ORAL | Status: DC

## 2020-05-05 MED ORDER — LOSARTAN POTASSIUM 50 MG PO TABS
50.0000 mg | ORAL_TABLET | Freq: Every evening | ORAL | Status: DC
  Administered 2020-05-05 – 2020-05-07 (×3): 50 mg via ORAL
  Filled 2020-05-05: qty 1
  Filled 2020-05-05: qty 2
  Filled 2020-05-05: qty 1

## 2020-05-05 MED ORDER — AZITHROMYCIN 250 MG PO TABS
500.0000 mg | ORAL_TABLET | Freq: Once | ORAL | Status: AC
  Administered 2020-05-05: 500 mg via ORAL
  Filled 2020-05-05: qty 2

## 2020-05-05 MED ORDER — ALBUTEROL SULFATE HFA 108 (90 BASE) MCG/ACT IN AERS
INHALATION_SPRAY | RESPIRATORY_TRACT | Status: AC
  Administered 2020-05-05: 8
  Filled 2020-05-05: qty 6.7

## 2020-05-05 MED ORDER — POTASSIUM CHLORIDE CRYS ER 20 MEQ PO TBCR
40.0000 meq | EXTENDED_RELEASE_TABLET | Freq: Once | ORAL | Status: AC
  Administered 2020-05-05: 40 meq via ORAL
  Filled 2020-05-05: qty 2

## 2020-05-05 MED ORDER — ATORVASTATIN CALCIUM 10 MG PO TABS
20.0000 mg | ORAL_TABLET | Freq: Every day | ORAL | Status: DC
  Administered 2020-05-05: 20 mg via ORAL
  Filled 2020-05-05: qty 2

## 2020-05-05 NOTE — ED Notes (Signed)
Wife at bedside, updated on plan for admission.  Awaiting bed assignment. Pt reports improvement in breathing following lasix.  Provided snack and fluids.

## 2020-05-05 NOTE — Progress Notes (Signed)
[facility] to [facility] transfer:  Patient with h/o prostate CA; HTN; HLD; and CAD (abnormal MPS in 02/2020; coronary CTA with multivessel CAD with recommendation for goal-directed medical therapy and aggressive RF modification as of 04/27/20) presenting with CP/SOB.  Patient sees Dr. Terri Skains.  Concern for new-onset CHF with volume overload.  Lower clinical suspicion for PNA (called on CT, does have leukocytosis).  EKG is hard to interpret - possible afib vs. Artifact).  He presented with CP/SOB since yesterday and worsened last night.  CP was pleuritic, SOB worse with exertion.  +tachypnea but not hypoxia, improved with O2.  CT with B effusions, possible PNA, no PE.  EKG with new ST changes.  Saw cards on 8/23 for high-risk CTA but no chest pain then.  BNP 347 (93 previously).  Will admit to telemetry, as it appears that the patient will need diuresis followed by cardiac cath.   Carlyon Shadow, M.D.

## 2020-05-05 NOTE — ED Triage Notes (Signed)
Cp and sob since yesterday  , pt wheezing no n/v

## 2020-05-05 NOTE — ED Provider Notes (Signed)
[name] EMERGENCY DEPARTMENT Provider Note   CSN: [id number] Arrival date & time: 05/05/20  1210     History Chief Complaint  Patient presents with  . Chest Pain  . Shortness of Breath    Bobby [name]. is a 69 y.o. male.  HPI      69yo male with history of hypertension, hyperlipidemia, diagnosis of coronary artery disease with a recent coronary CTA showing high calcium score, presents with concern for chest pain and shortness of breath.  Describes the chest pain as sharp, retrosternal and worse with deep breaths.  Symptoms began yesterday and worsened last night and reports that his shortness of breath significantly worsened over the last hour.  Reports the shortness of breath is worse when laying flat, and explains that is why he is sitting up straight in his emergency department bed.  He denies fevers or significant cough.  Denies congestion, sore throat, nausea, vomiting, fevers, chills.  Denies leg pain or swelling.  Denies ever having symptoms like this before.  Past Medical History:  Diagnosis Date  . Bronchitis   . Coronary artery calcification of native artery   . Family history of pancreatic cancer   . History of kidney stones   . Hyperlipidemia   . Hypertension   . Pneumonia   . Prostate cancer [facility])     Patient Active Problem List   Diagnosis Date Noted  . New onset of congestive heart failure ([place]) 05/05/2020  . Genetic testing 09/10/2018  . Family history of breast cancer   . Family history of pancreatic cancer   . Family history of kidney cancer   . Malignant neoplasm of prostate ([place]) 05/22/2017  . OM (otitis media), acute 09/08/2012  . Sinusitis 09/08/2012  . Conjunctivitis 09/08/2012  . GASTROENTERITIS 11/10/2009  . DIVERTICULOSIS, COLON 11/10/2009  . NEPHROLITHIASIS, HX OF 11/10/2009  . 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 ABDOMINAL PAIN, LEFT LOWER QUADRANT 04/28/2007    Past Surgical History:  Procedure Laterality Date  .  EXTRACORPOREAL SHOCK WAVE LITHOTRIPSY Right 04/17/2017   Procedure: RIGHT EXTRACORPOREAL SHOCK WAVE LITHOTRIPSY (ESWL);  Surgeon: Kathie Rhodes, MD;  Location: [facility] ORS;  Service: Urology;  Laterality: Right;  . HERNIA REPAIR     age 40  . PROSTATE BIOPSY         Family History  Problem Relation Age of Onset  . Breast cancer Sister 50       "negative genetic testing"  . Pancreatic cancer Brother 30  . Breast cancer Maternal Aunt   . Kidney cancer Maternal Aunt        dx 38s    Social History   Tobacco Use  . Smoking status: Former Smoker    Packs/day: 0.25    Years: 10.00    Pack years: 2.50    Types: Cigarettes    Quit date: 01/20/2012    Years since quitting: 8.2  . Smokeless tobacco: Never Used  Vaping Use  . Vaping Use: Never used  Substance Use Topics  . Alcohol use: No  . Drug use: No    Home Medications Prior to Admission medications   Medication Sig Start Date End Date Taking? Authorizing Provider  abiraterone acetate (ZYTIGA) 250 MG tablet TAKE 4 TABLETS (1,000 MG TOTAL) BY MOUTH DAILY. TAKE ON AN EMPTY STOMACH, 1 HOUR BEFORE OR 2 HOURS AFTER A MEAL. 06/01/19   Wyatt Portela, MD  aspirin EC 81 MG tablet Take 1 tablet (81 mg total) by mouth daily. 01/21/20  Tolia, Sunit, DO  atorvastatin (LIPITOR) 20 MG tablet Take 20 mg by mouth daily.    [provider]  diltiazem (CARDIZEM CD) 240 MG 24 hr capsule Take 1 capsule (240 mg total) by mouth daily. 04/04/20 05/04/20  Tolia, Sunit, DO  hydrochlorothiazide (HYDRODIURIL) 25 MG tablet Take 1 tablet (25 mg total) by mouth in the morning. 03/03/20 06/01/20  Tolia, Sunit, DO  losartan (COZAAR) 50 MG tablet Take 1 tablet (50 mg total) by mouth every evening. 03/03/20 06/01/20  Tolia, Sunit, DO  meloxicam (MOBIC) 7.5 MG tablet Take 7.5 mg by mouth 2 (two) times daily as needed. 02/21/20   [provider]  methocarbamol (ROBAXIN) 500 MG tablet Take 1 tablet by mouth daily as needed.    [provider]   Multiple Vitamins-Minerals (MULTIVITAMIN WITH MINERALS) tablet Take 1 tablet by mouth daily.    [provider]  MYRBETRIQ 25 MG TB24 tablet Take 25 mg by mouth daily. 06/03/19   [provider]  predniSONE (DELTASONE) 5 MG tablet TAKE 1 TABLET BY MOUTH DAILY WITH BREAKFAST 02/24/20   Wyatt Portela, MD  solifenacin (VESICARE) 5 MG tablet Take 5 mg by mouth daily. 04/03/20   [provider]  tamsulosin (FLOMAX) 0.4 MG CAPS capsule Take 1 capsule (0.4 mg total) by mouth daily after supper. 04/17/17   Kathie Rhodes, MD  traMADol (ULTRAM) 50 MG tablet Take 50 mg by mouth 2 (two) times daily as needed. 03/29/20   [provider]    Allergies    Patient has no known allergies.  Review of Systems   Review of Systems  Constitutional: Negative for fever.  HENT: Negative for sore throat.   Eyes: Negative for visual disturbance.  Respiratory: Positive for cough (occasional) and shortness of breath.   Cardiovascular: Positive for chest pain. Negative for leg swelling.  Gastrointestinal: Negative for abdominal pain, diarrhea, nausea and vomiting.  Genitourinary: Negative for difficulty urinating.  Musculoskeletal: Negative for back pain and neck stiffness.  Skin: Negative for rash.  Neurological: Negative for syncope and headaches.    Physical Exam Updated Vital Signs BP (!) 128/115 Comment: 2L  Pulse 96 Comment: 2L  Temp 98.7 F (37.1 C) (Oral)   Resp (!) 25 Comment: 2L  Ht 5\' 6"  (1.676 m)   Wt 112.9 kg   SpO2 99% Comment: 2L  BMI 40.19 kg/m   Physical Exam Vitals and nursing note reviewed.  Constitutional:      General: He is not in acute distress.    Appearance: He is well-developed. He is not diaphoretic.  HENT:     Head: Normocephalic and atraumatic.  Eyes:     Conjunctiva/sclera: Conjunctivae normal.  Cardiovascular:     Rate and Rhythm: Normal rate and regular rhythm.     Heart sounds: Normal heart sounds. No murmur heard.  No friction  rub. No gallop.   Pulmonary:     Effort: Pulmonary effort is normal. No respiratory distress.     Breath sounds: Decreased breath sounds (bases bilaterally) present. No wheezing or rales.  Abdominal:     General: There is no distension.     Palpations: Abdomen is soft.     Tenderness: There is no abdominal tenderness. There is no guarding.  Musculoskeletal:     Cervical back: Normal range of motion.     Right lower leg: Edema (trace) present.     Left lower leg: Edema (trace) present.  Skin:    General: Skin is warm and dry.  Neurological:     Mental Status: He is alert and oriented to person, place, and time.     ED Results / Procedures / Treatments   Labs (all labs ordered are listed, but only abnormal results are displayed) Labs Reviewed  CBC WITH DIFFERENTIAL/PLATELET - Abnormal; Notable for the following components:      Result Value   WBC 14.3 (*)    Hemoglobin 12.6 (*)    HCT 38.0 (*)    Neutro Abs 12.7 (*)    Lymphs Abs 0.6 (*)    All other components within normal limits  COMPREHENSIVE METABOLIC PANEL - Abnormal; Notable for the following components:   Potassium 3.3 (*)    Glucose, Bld 159 (*)    All other components within normal limits  BRAIN NATRIURETIC PEPTIDE - Abnormal; Notable for the following components:   B Natriuretic Peptide 347.5 (*)    All other components within normal limits  SARS CORONAVIRUS 2 BY RT PCR (HOSPITAL ORDER, [facility])  MAGNESIUM  TROPONIN I (HIGH SENSITIVITY)  TROPONIN I (HIGH SENSITIVITY)    EKG EKG Interpretation  Date/Time:  Friday May 05 2020 12:19:51 EDT Ventricular Rate:  87 PR Interval:    QRS Duration: 84 QT Interval:  382 QTC Calculation: 459 R Axis:   27 Text Interpretation: Atrial fibrillation  versus artifact Minimal voltage criteria for LVH, may be normal variant ( R in aVL ) ST & T wave abnormality, consider lateral ischemia Abnormal ECG ST abnormalities new compared to prior  Confirmed by Gareth Morgan [phone]) on 05/05/2020 3:15:07 PM   Radiology CT Angio Chest PE W and/or Wo Contrast  Result Date: 05/05/2020 CLINICAL DATA:  Chest pain and shortness of breath EXAM: CT ANGIOGRAPHY CHEST WITH CONTRAST TECHNIQUE: Multidetector CT imaging of the chest was performed using the standard protocol during bolus administration of intravenous contrast. Multiplanar CT image reconstructions and MIPs were obtained to evaluate the vascular anatomy. CONTRAST:  61mL OMNIPAQUE IOHEXOL 350 MG/ML SOLN COMPARISON:  Coronary CT April 26, 2020; CT angiogram chest September 02, 2019 FINDINGS: Cardiovascular: There is no demonstrable pulmonary embolus. There is no appreciable thoracic aortic aneurysm or dissection. The visualized great vessels appear normal. There is aortic atherosclerosis. There are foci of coronary artery calcification. There is no pericardial effusion or pericardial thickening. Mediastinum/Nodes: Visualized thyroid appears normal. There are subcentimeter mediastinal lymph nodes. There is no adenopathy meeting size criteria for pathologic significance in the thoracic region. No esophageal lesions are evident. Lungs/Pleura: There are pleural effusions bilaterally with compressive atelectasis in the lung bases. There is airspace consolidation as well in the posterior left base. There is atelectatic change in inferior lingula. There is apparent fluid tracking along the left major fissure. Upper Abdomen: There are several small cystic lesions in the liver, stable. Visualized upper abdominal structures otherwise appear unremarkable. Musculoskeletal: No blastic or lytic bone lesions. No evident chest wall lesions. Review of the MIP images confirms the above findings. IMPRESSION: 1. No demonstrable pulmonary embolus. No thoracic aortic aneurysm or dissection. There are foci of aortic atherosclerosis as well as foci of coronary artery calcification. 2. Bilateral pleural effusions with bibasilar  compressive atelectasis. Suspected pneumonia in the posterior left base as well. Atelectatic change in inferior lingula. 3.  No adenopathy evident. Aortic Atherosclerosis (ICD10-I70.0). Electronically Signed   By: Lowella Grip III M.D.   On: 05/05/2020 14:52    Procedures Procedures (including critical care time)  Medications Ordered in ED Medications  cephALEXin (KEFLEX) capsule  500 mg (500 mg Oral Given 05/05/20 1542)  atorvastatin (LIPITOR) tablet 20 mg (has no administration in time range)  diltiazem (CARDIZEM CD) 24 hr capsule 240 mg (has no administration in time range)  hydrochlorothiazide (HYDRODIURIL) tablet 25 mg (has no administration in time range)  losartan (COZAAR) tablet 50 mg (has no administration in time range)  aspirin EC tablet 81 mg (81 mg Oral Not Given 05/05/20 1608)  albuterol (VENTOLIN HFA) 108 (90 Base) MCG/ACT inhaler (8 puffs  Given 05/05/20 1244)  iohexol (OMNIPAQUE) 350 MG/ML injection 100 mL (89 mLs Intravenous Contrast Given 05/05/20 1436)  furosemide (LASIX) injection 40 mg (40 mg Intravenous Given 05/05/20 1543)  potassium chloride SA (KLOR-CON) CR tablet 40 mEq (40 mEq Oral Given 05/05/20 1542)  aspirin chewable tablet 324 mg (324 mg Oral Given 05/05/20 1541)  azithromycin (ZITHROMAX) tablet 500 mg (500 mg Oral Given 05/05/20 1542)    ED Course  I have reviewed the triage vital signs and the nursing notes.  Pertinent labs & imaging results that were available during my care of the patient were reviewed by me and considered in my medical decision making (see chart for details).    MDM Rules/Calculators/A&P                          69yo male with history of hypertension, hyperlipidemia, diagnosis of coronary artery disease with a recent coronary CTA showing high calcium score with Dr. Terri Skains, presents with concern for chest pain and shortness of breath.   Differential diagnosis for chest pain/dyspnea includes pulmonary embolus, dissection, pneumothorax,  pneumonia, ACS, CHF, myocarditis, pericarditis.  EKG was done and evaluate by me and showed ST depressions which appeared new in comparison to prior available however on review with Dr. Einar Gip reports similar findings in May, likely hypertensive and feels that rhythm more consistent with sinus than atrial fibrillation.   Given pleuritic CP, tachypnea with RR 40, CT PE study ordered which shows no evidence of pulmonary embolus, but does show findings of pleural effusion and compressive atelectasis versus/and pneumonia.  Labs show BNP of 347 from previous of 80.  Initial troponin is 15.  Feel most likely etiology of patient's symptoms is new onset congestive heart failure/volume overload and have concern for cardiac etiology. Given aspirin, lasix.  He does report having similar symptoms with pneumonia in the past, has a leukocytosis, and difficult to rule out pneumonia on CT, will be given oral antibiotics for possible community-acquired pneumonia although clinical history less consistent with this.  Improved with 2L [place].   Discussed with Dr. Lorin Mercy, hospitalist and Dr. Einar Gip of [facility]. Will admit for further care.      Final Clinical Impression(s) / ED Diagnoses Final diagnoses:  Shortness of breath  Chest pain, unspecified type  Congestive heart failure, unspecified HF chronicity, unspecified heart failure type ([place])  Pleural effusion  Community acquired pneumonia of left lung, unspecified part of lung    Rx / DC Orders ED Discharge Orders    None       Gareth Morgan, MD 05/05/20 1615

## 2020-05-05 NOTE — Telephone Encounter (Signed)
Patient called stating he was having SOB and Chest Pain and was told to call us if he had those symptoms. His chest pain and breathing worsened as he started to tell him symptoms. I advised him to call 911 or have someone take him to the hospital.

## 2020-05-06 ENCOUNTER — Inpatient Hospital Stay (HOSPITAL_COMMUNITY): Payer: Medicare HMO

## 2020-05-06 ENCOUNTER — Encounter (HOSPITAL_COMMUNITY): Payer: Self-pay | Admitting: Internal Medicine

## 2020-05-06 DIAGNOSIS — I25119 Atherosclerotic heart disease of native coronary artery with unspecified angina pectoris: Secondary | ICD-10-CM

## 2020-05-06 DIAGNOSIS — R7303 Prediabetes: Secondary | ICD-10-CM | POA: Diagnosis present

## 2020-05-06 DIAGNOSIS — D72829 Elevated white blood cell count, unspecified: Secondary | ICD-10-CM | POA: Diagnosis present

## 2020-05-06 DIAGNOSIS — R0789 Other chest pain: Secondary | ICD-10-CM

## 2020-05-06 DIAGNOSIS — I10 Essential (primary) hypertension: Secondary | ICD-10-CM | POA: Diagnosis present

## 2020-05-06 DIAGNOSIS — I2510 Atherosclerotic heart disease of native coronary artery without angina pectoris: Secondary | ICD-10-CM | POA: Diagnosis present

## 2020-05-06 DIAGNOSIS — C61 Malignant neoplasm of prostate: Secondary | ICD-10-CM

## 2020-05-06 DIAGNOSIS — I5033 Acute on chronic diastolic (congestive) heart failure: Secondary | ICD-10-CM | POA: Diagnosis present

## 2020-05-06 DIAGNOSIS — E782 Mixed hyperlipidemia: Secondary | ICD-10-CM | POA: Diagnosis present

## 2020-05-06 DIAGNOSIS — E876 Hypokalemia: Secondary | ICD-10-CM | POA: Diagnosis present

## 2020-05-06 DIAGNOSIS — I4891 Unspecified atrial fibrillation: Secondary | ICD-10-CM | POA: Diagnosis present

## 2020-05-06 LAB — COMPREHENSIVE METABOLIC PANEL
ALT: 15 U/L (ref 0–44)
AST: 13 U/L — ABNORMAL LOW (ref 15–41)
Albumin: 3.2 g/dL — ABNORMAL LOW (ref 3.5–5.0)
Alkaline Phosphatase: 73 U/L (ref 38–126)
Anion gap: 14 (ref 5–15)
BUN: 18 mg/dL (ref 8–23)
CO2: 26 mmol/L (ref 22–32)
Calcium: 9.3 mg/dL (ref 8.9–10.3)
Chloride: 98 mmol/L (ref 98–111)
Creatinine, Ser: 1.08 mg/dL (ref 0.61–1.24)
GFR calc Af Amer: >60 mL/min (ref >60)
GFR calc non Af Amer: >60 mL/min (ref >60)
Glucose, Bld: 185 mg/dL — ABNORMAL HIGH (ref 70–99)
Potassium: 3 mmol/L — ABNORMAL LOW (ref 3.5–5.1)
Sodium: 138 mmol/L (ref 135–145)
Total Bilirubin: 1 mg/dL (ref 0.3–1.2)
Total Protein: 6.9 g/dL (ref 6.5–8.1)

## 2020-05-06 LAB — CBC WITH DIFFERENTIAL/PLATELET
Abs Immature Granulocytes: 0.08 10*3/uL — ABNORMAL HIGH (ref 0.00–0.07)
Basophils Absolute: 0 10*3/uL (ref 0.0–0.1)
Basophils Relative: 0 %
Eosinophils Absolute: 0.1 10*3/uL (ref 0.0–0.5)
Eosinophils Relative: 1 %
HCT: 38.1 % — ABNORMAL LOW (ref 39.0–52.0)
Hemoglobin: 12.4 g/dL — ABNORMAL LOW (ref 13.0–17.0)
Immature Granulocytes: 1 %
Lymphocytes Relative: 6 %
Lymphs Abs: 1 10*3/uL (ref 0.7–4.0)
MCH: 27.4 pg (ref 26.0–34.0)
MCHC: 32.5 g/dL (ref 30.0–36.0)
MCV: 84.1 fL (ref 80.0–100.0)
Monocytes Absolute: 1.1 10*3/uL — ABNORMAL HIGH (ref 0.1–1.0)
Monocytes Relative: 7 %
Neutro Abs: 13.9 10*3/uL — ABNORMAL HIGH (ref 1.7–7.7)
Neutrophils Relative %: 85 %
Platelets: 254 10*3/uL (ref 150–400)
RBC: 4.53 MIL/uL (ref 4.22–5.81)
RDW: 15 % (ref 11.5–15.5)
WBC: 16.2 10*3/uL — ABNORMAL HIGH (ref 4.0–10.5)
nRBC: 0 % (ref 0.0–0.2)

## 2020-05-06 LAB — URINALYSIS, ROUTINE W REFLEX MICROSCOPIC
Bacteria, UA: NONE SEEN
Bilirubin Urine: NEGATIVE
Glucose, UA: NEGATIVE mg/dL
Ketones, ur: NEGATIVE mg/dL
Leukocytes,Ua: NEGATIVE
Nitrite: NEGATIVE
Protein, ur: NEGATIVE mg/dL
Specific Gravity, Urine: 1.01 (ref 1.005–1.030)
pH: 5 (ref 5.0–8.0)

## 2020-05-06 LAB — STREP PNEUMONIAE URINARY ANTIGEN: Strep Pneumo Urinary Antigen: NEGATIVE

## 2020-05-06 LAB — PROTIME-INR
INR: 1 (ref 0.8–1.2)
Prothrombin Time: 13.1 seconds (ref 11.4–15.2)

## 2020-05-06 LAB — TSH: TSH: 1.483 u[IU]/mL (ref 0.350–4.500)

## 2020-05-06 LAB — MRSA PCR SCREENING: MRSA by PCR: NEGATIVE

## 2020-05-06 LAB — APTT: aPTT: 32 seconds (ref 24–36)

## 2020-05-06 LAB — ECHOCARDIOGRAM COMPLETE
AR max vel: 3.04 cm2
AV Peak grad: 13.8 mmHg
Ao pk vel: 1.86 m/s
Area-P 1/2: 3.37 cm2
Height: 66 in
S' Lateral: 2.5 cm
Weight: 3996.5 oz

## 2020-05-06 LAB — HIV ANTIBODY (ROUTINE TESTING W REFLEX): HIV Screen 4th Generation wRfx: NONREACTIVE

## 2020-05-06 LAB — TROPONIN I (HIGH SENSITIVITY): Troponin I (High Sensitivity): 22 ng/L — ABNORMAL HIGH (ref <18)

## 2020-05-06 LAB — C-REACTIVE PROTEIN: CRP: 21 mg/dL — ABNORMAL HIGH (ref <1.0)

## 2020-05-06 LAB — MAGNESIUM: Magnesium: 1.9 mg/dL (ref 1.7–2.4)

## 2020-05-06 LAB — HEMOGLOBIN A1C
Hgb A1c MFr Bld: 5.8 % — ABNORMAL HIGH (ref 4.8–5.6)
Mean Plasma Glucose: 119.76 mg/dL

## 2020-05-06 LAB — PROCALCITONIN: Procalcitonin: <0.1 ng/mL

## 2020-05-06 MED ORDER — METOPROLOL TARTRATE 50 MG PO TABS
50.0000 mg | ORAL_TABLET | Freq: Two times a day (BID) | ORAL | Status: DC
  Administered 2020-05-06 – 2020-05-08 (×5): 50 mg via ORAL
  Filled 2020-05-06 (×5): qty 1

## 2020-05-06 MED ORDER — PREDNISONE 10 MG PO TABS
5.0000 mg | ORAL_TABLET | Freq: Every day | ORAL | Status: DC
  Administered 2020-05-06 – 2020-05-08 (×3): 5 mg via ORAL
  Filled 2020-05-06 (×3): qty 1

## 2020-05-06 MED ORDER — POTASSIUM CHLORIDE CRYS ER 20 MEQ PO TBCR
40.0000 meq | EXTENDED_RELEASE_TABLET | ORAL | Status: AC
  Administered 2020-05-06 (×2): 40 meq via ORAL
  Filled 2020-05-06 (×2): qty 2

## 2020-05-06 MED ORDER — METHOCARBAMOL 500 MG PO TABS: 500.0000 mg | ORAL_TABLET | Freq: Every day | ORAL | Status: DC | PRN

## 2020-05-06 MED ORDER — SODIUM CHLORIDE 0.9% FLUSH: 3.0000 mL | INTRAVENOUS | Status: DC | PRN

## 2020-05-06 MED ORDER — POLYETHYLENE GLYCOL 3350 17 G PO PACK: 17.0000 g | PACK | Freq: Every day | ORAL | Status: DC

## 2020-05-06 MED ORDER — ONDANSETRON HCL 4 MG/2ML IJ SOLN: 4.0000 mg | Freq: Four times a day (QID) | INTRAMUSCULAR | Status: DC | PRN

## 2020-05-06 MED ORDER — ATORVASTATIN CALCIUM 80 MG PO TABS
80.0000 mg | ORAL_TABLET | Freq: Every day | ORAL | Status: DC
  Administered 2020-05-06 – 2020-05-08 (×3): 80 mg via ORAL
  Filled 2020-05-06 (×3): qty 1

## 2020-05-06 MED ORDER — ACETAMINOPHEN 325 MG PO TABS
650.0000 mg | ORAL_TABLET | ORAL | Status: DC | PRN
  Administered 2020-05-06: 650 mg via ORAL
  Filled 2020-05-06: qty 2

## 2020-05-06 MED ORDER — TAMSULOSIN HCL 0.4 MG PO CAPS
0.4000 mg | ORAL_CAPSULE | Freq: Every day | ORAL | Status: DC
  Administered 2020-05-06 – 2020-05-07 (×2): 0.4 mg via ORAL
  Filled 2020-05-06 (×2): qty 1

## 2020-05-06 MED ORDER — SODIUM CHLORIDE 0.9 % IV SOLN: 250.0000 mL | INTRAVENOUS | Status: DC | PRN

## 2020-05-06 MED ORDER — ENOXAPARIN SODIUM 40 MG/0.4ML ~~LOC~~ SOLN
40.0000 mg | SUBCUTANEOUS | Status: DC
  Administered 2020-05-06: 40 mg via SUBCUTANEOUS
  Filled 2020-05-06 (×2): qty 0.4

## 2020-05-06 MED ORDER — DARIFENACIN HYDROBROMIDE ER 7.5 MG PO TB24
7.5000 mg | ORAL_TABLET | Freq: Every day | ORAL | Status: DC
  Administered 2020-05-06 – 2020-05-08 (×3): 7.5 mg via ORAL
  Filled 2020-05-06 (×3): qty 1

## 2020-05-06 MED ORDER — MAGNESIUM OXIDE 400 (241.3 MG) MG PO TABS
400.0000 mg | ORAL_TABLET | Freq: Two times a day (BID) | ORAL | Status: DC
  Administered 2020-05-06 – 2020-05-08 (×5): 400 mg via ORAL
  Filled 2020-05-06 (×5): qty 1

## 2020-05-06 MED ORDER — POTASSIUM CHLORIDE CRYS ER 20 MEQ PO TBCR
40.0000 meq | EXTENDED_RELEASE_TABLET | Freq: Once | ORAL | Status: AC
  Administered 2020-05-06: 40 meq via ORAL
  Filled 2020-05-06: qty 2

## 2020-05-06 MED ORDER — POLYETHYLENE GLYCOL 3350 17 G PO PACK: 17.0000 g | PACK | Freq: Every day | ORAL | Status: DC | PRN

## 2020-05-06 MED ORDER — ABIRATERONE ACETATE 250 MG PO TABS
1000.0000 mg | ORAL_TABLET | Freq: Every day | ORAL | Status: DC
  Administered 2020-05-06 – 2020-05-08 (×3): 1000 mg via ORAL
  Filled 2020-05-06 (×4): qty 4

## 2020-05-06 MED ORDER — MORPHINE SULFATE (PF) 2 MG/ML IV SOLN
0.5000 mg | INTRAVENOUS | Status: DC | PRN
  Administered 2020-05-06 – 2020-05-07 (×4): 1 mg via INTRAVENOUS
  Filled 2020-05-06 (×4): qty 1

## 2020-05-06 MED ORDER — TRAMADOL HCL 50 MG PO TABS
50.0000 mg | ORAL_TABLET | Freq: Two times a day (BID) | ORAL | Status: DC | PRN
  Administered 2020-05-07 – 2020-05-08 (×3): 50 mg via ORAL
  Filled 2020-05-06 (×3): qty 1

## 2020-05-06 MED ORDER — ASPIRIN 81 MG PO CHEW
81.0000 mg | CHEWABLE_TABLET | Freq: Every day | ORAL | Status: DC
  Administered 2020-05-06 – 2020-05-07 (×2): 81 mg via ORAL
  Filled 2020-05-06 (×2): qty 1

## 2020-05-06 MED ORDER — TRAMADOL HCL 50 MG PO TABS
50.0000 mg | ORAL_TABLET | Freq: Two times a day (BID) | ORAL | Status: DC | PRN
  Administered 2020-05-06: 50 mg via ORAL
  Filled 2020-05-06: qty 1

## 2020-05-06 MED ORDER — FUROSEMIDE 10 MG/ML IJ SOLN
20.0000 mg | Freq: Once | INTRAMUSCULAR | Status: DC
  Administered 2020-05-06: 20 mg via INTRAVENOUS

## 2020-05-06 MED ORDER — FUROSEMIDE 10 MG/ML IJ SOLN
40.0000 mg | Freq: Two times a day (BID) | INTRAMUSCULAR | Status: DC
  Administered 2020-05-06: 40 mg via INTRAVENOUS
  Filled 2020-05-06: qty 4

## 2020-05-06 MED ORDER — FUROSEMIDE 10 MG/ML IJ SOLN
60.0000 mg | Freq: Two times a day (BID) | INTRAMUSCULAR | Status: DC
  Administered 2020-05-06 (×2): 60 mg via INTRAVENOUS
  Filled 2020-05-06 (×3): qty 6

## 2020-05-06 MED ORDER — MELOXICAM 7.5 MG PO TABS
7.5000 mg | ORAL_TABLET | Freq: Two times a day (BID) | ORAL | Status: DC | PRN
  Filled 2020-05-06: qty 1

## 2020-05-06 MED ORDER — SODIUM CHLORIDE 0.9% FLUSH
3.0000 mL | Freq: Two times a day (BID) | INTRAVENOUS | Status: DC
  Administered 2020-05-06 – 2020-05-07 (×4): 3 mL via INTRAVENOUS

## 2020-05-06 MED ORDER — AZITHROMYCIN 500 MG PO TABS
500.0000 mg | ORAL_TABLET | Freq: Every day | ORAL | Status: DC
  Administered 2020-05-06 – 2020-05-08 (×3): 500 mg via ORAL
  Filled 2020-05-06 (×3): qty 1

## 2020-05-06 MED ORDER — MIRABEGRON ER 25 MG PO TB24
25.0000 mg | ORAL_TABLET | Freq: Every day | ORAL | Status: DC
  Administered 2020-05-06 – 2020-05-08 (×3): 25 mg via ORAL
  Filled 2020-05-06 (×3): qty 1

## 2020-05-06 MED ORDER — SODIUM CHLORIDE 0.9 % IV SOLN
1.0000 g | INTRAVENOUS | Status: DC
  Administered 2020-05-06 – 2020-05-07 (×2): 1 g via INTRAVENOUS
  Filled 2020-05-06 (×3): qty 10

## 2020-05-06 NOTE — Progress Notes (Signed)
Patient currently under chemotherapy treatment taking Zytiga. Patient was made aware that he would need to provide home Zytiga to in house pharmacy. Patient understood and said that his wife will bring it in today when she comes to visit. Pharmacy made aware.

## 2020-05-06 NOTE — Telephone Encounter (Signed)
I agree.  Ask him if the pain improved with nitro tabs, I believe he has nitro tabs.  I  can see him in the office next week as well.

## 2020-05-06 NOTE — Progress Notes (Signed)
°  Echocardiogram 2D Echocardiogram has been performed.  Matilde Bash 05/06/2020, 11:44 AM

## 2020-05-06 NOTE — H&P (View-Only) (Signed)
CARDIOLOGY CONSULT NOTE  Patient ID: Bobby Chatters. MRN: [id number] DOB/AGE: [birth year] 69 y.o.  Admit date: 05/05/2020 Referring Physician Carney Harder, MD Primary Physician:  [name], MD Reason for Consultation  Chest pain and dyspnea  Patient ID: Bobby Chatters., male    DOB: 1951/05/05, 69 y.o.   MRN: [id number]  Chief Complaint  Patient presents with  . Chest Pain  . Shortness of Breath   HPI:    Bobby [name].  is a 69 y.o. Coronary artery disease, coronary CTA revealing no hemodynamically significant stenosis on 04/26/2020, severe coronary artery calcification 1101 AU, prediabetes, hyperlipidemia, hypertension, advanced age, former smoker, obesity due to excess calories, chronic dyspnea on exertion presented to the emergency room yesterday morning after he woke up in the morning started feeling sharp pain in the middle of the chest and also going across the chest just below his breast area bilaterally.  He also noticed marked shortness of breath, worsening orthopnea.  Due to back pain he does sleep reclined but he was unable to even lay down due to pain mostly but also shortness of breath.  CT angiogram of the chest due to pleuritic type chest pain on 05/05/2020 was negative but did reveal a small left lower lobe pneumonia and bilateral effusion.  I was consulted for management of this chest pain and elevated BNP suggestive of acute diastolic heart failure.  This morning patient states that he is feeling better but still has shortness of breath and still has chest pain when he takes a deep breath across his chest.  No dizziness or syncope.  He has not noticed any significant leg edema.  Past Medical History:  Diagnosis Date  . Bronchitis   . Coronary artery calcification of native artery   . Family history of pancreatic cancer   . History of kidney stones   . Hyperlipidemia   . Hypertension   . Pneumonia   . Prostate cancer [facility])    Past Surgical History:  Procedure  Laterality Date  . EXTRACORPOREAL SHOCK WAVE LITHOTRIPSY Right 04/17/2017   Procedure: RIGHT EXTRACORPOREAL SHOCK WAVE LITHOTRIPSY (ESWL);  Surgeon: Kathie Rhodes, MD;  Location: [facility];  Service: Urology;  Laterality: Right;  . HERNIA REPAIR     age 16  . PROSTATE BIOPSY     Social History   Tobacco Use  . Smoking status: Former Smoker    Packs/day: 0.25    Years: 10.00    Pack years: 2.50    Types: Cigarettes    Quit date: 01/20/2012    Years since quitting: 8.2  . Smokeless tobacco: Never Used  Substance Use Topics  . Alcohol use: No    Marital Sttus: Married   ROS  Review of Systems  Cardiovascular: Positive for chest pain and dyspnea on exertion. Negative for leg swelling.  Musculoskeletal: Positive for arthritis and back pain.  Gastrointestinal: Negative for melena.  Neurological: Negative.   Psychiatric/Behavioral: Negative.   All other systems reviewed and are negative.  Objective   Vitals with BMI 05/06/2020 05/06/2020 05/06/2020  Height - - -  Weight - - 249 lbs 13 oz  BMI - - 78.29  Systolic 562 - -  Diastolic 72 - -  Pulse 72 82 -    Blood pressure 109/72, pulse 72, temperature 97.8 F (36.6 C), temperature source Oral, resp. rate (!) 26, height 5\' 6"  (1.676 m), weight 113.3 kg, SpO2 96 %. Body mass index is 40.32 kg/m.    Physical Exam Constitutional:  Comments: Morbidly obese in no acute distress.  Cardiovascular:     Rate and Rhythm: Normal rate and regular rhythm.     Pulses:          Carotid pulses are 2+ on the right side and 2+ on the left side.      Dorsalis pedis pulses are 2+ on the right side and 2+ on the left side.       Posterior tibial pulses are 2+ on the right side and 2+ on the left side.     Heart sounds: Murmur heard.  Early systolic murmur is present with a grade of 2/6 at the upper right sternal border.  No gallop.      Comments: Femoral and popliteal pulse difficult to feel due to patient's body habitus.  2+ bilateral below  knee pitting leg edema.  JVD difficult to see due to short neck. Pulmonary:     Effort: Pulmonary effort is normal.     Breath sounds: Examination of the right-lower field reveals rales. Examination of the left-lower field reveals rales. Rales present.  Abdominal:     General: Bowel sounds are normal.     Palpations: Abdomen is soft.     Comments: Obese.     Laboratory examination:   Recent Labs    04/19/20 0856 05/05/20 1243 05/06/20 0303  NA 140 137 138  K 4.0 3.3* 3.0*  CL 101 102 98  CO2 23 25 26   GLUCOSE 142* 159* 185*  BUN 18 20 18   CREATININE 1.07 0.88 1.08  CALCIUM 9.3 9.0 9.3  GFRNONAA 71 >60 >60  GFRAA 82 >60 >60   estimated creatinine clearance is 77.4 mL/min (by C-G formula based on SCr of 1.08 mg/dL).  CMP Latest Ref Rng & Units 05/06/2020 05/05/2020 04/19/2020  Glucose 70 - 99 mg/dL 185(H) 159(H) 142(H)  BUN 8 - 23 mg/dL 18 20 18   Creatinine 0.61 - 1.24 mg/dL 1.08 0.88 1.07  Sodium 135 - 145 mmol/L 138 137 140  Potassium 3.5 - 5.1 mmol/L 3.0(L) 3.3(L) 4.0  Chloride 98 - 111 mmol/L 98 102 101  CO2 22 - 32 mmol/L 26 25 23   Calcium 8.9 - 10.3 mg/dL 9.3 9.0 9.3  Total Protein 6.5 - 8.1 g/dL 6.9 7.0 -  Total Bilirubin 0.3 - 1.2 mg/dL 1.0 0.6 -  Alkaline Phos 38 - 126 U/L 73 72 -  AST 15 - 41 U/L 13(L) 16 -  ALT 0 - 44 U/L 15 15 -   CBC Latest Ref Rng & Units 05/06/2020 05/05/2020 02/16/2020  WBC 4.0 - 10.5 K/uL 16.2(H) 14.3(H) 7.8  Hemoglobin 13.0 - 17.0 g/dL 12.4(L) 12.6(L) 13.2  Hematocrit 39 - 52 % 38.1(L) 38.0(L) 39.5  Platelets 150 - 400 K/uL 254 235 233   Lipid Panel Recent Labs    03/16/20 0843  CHOL 122  TRIG 123  LDLCALC 55  HDL 45    HEMOGLOBIN A1C Lab Results  Component Value Date   HGBA1C 5.8 (H) 05/06/2020   MPG 119.76 05/06/2020   TSH Recent Labs    05/06/20 0131  TSH 1.483   BNP (last 3 results) Recent Labs    09/02/19 1642 05/05/20 1243  BNP 93.8 347.5*    Medications and allergies  No Known Allergies   Current Meds   Medication Sig  . abiraterone acetate (ZYTIGA) 250 MG tablet TAKE 4 TABLETS (1,000 MG TOTAL) BY MOUTH DAILY. TAKE ON AN EMPTY STOMACH, 1 HOUR BEFORE OR 2 HOURS AFTER A MEAL.  Marland Kitchen  atorvastatin (LIPITOR) 20 MG tablet Take 20 mg by mouth daily.  . hydrochlorothiazide (HYDRODIURIL) 25 MG tablet Take 1 tablet (25 mg total) by mouth in the morning.  Marland Kitchen losartan (COZAAR) 50 MG tablet Take 1 tablet (50 mg total) by mouth every evening.  Marland Kitchen MYRBETRIQ 25 MG TB24 tablet Take 25 mg by mouth daily.  . predniSONE (DELTASONE) 5 MG tablet TAKE 1 TABLET BY MOUTH DAILY WITH BREAKFAST (Patient taking differently: Take 5 mg by mouth daily with breakfast. )    Scheduled Meds: . abiraterone acetate  1,000 mg Oral Daily  . azithromycin  500 mg Oral Daily  . darifenacin  7.5 mg Oral Daily  . diltiazem  240 mg Oral Daily  . enoxaparin (LOVENOX) injection  40 mg Subcutaneous Q24H  . furosemide  60 mg Intravenous BID  . losartan  50 mg Oral QPM  . mirabegron ER  25 mg Oral Daily  . polyethylene glycol  17 g Oral Daily  . potassium chloride  40 mEq Oral Q4H  . predniSONE  5 mg Oral Q breakfast  . sodium chloride flush  3 mL Intravenous Q12H  . tamsulosin  0.4 mg Oral QPC supper   Continuous Infusions: . sodium chloride    . cefTRIAXone (ROCEPHIN)  IV 1 g (05/06/20 0859)   PRN Meds:.sodium chloride, acetaminophen, meloxicam, methocarbamol, ondansetron (ZOFRAN) IV, sodium chloride flush, traMADol   I/O last 3 completed shifts: In: 34 [P.O.:477] Out: 1700 [Urine:1700] Total I/O In: 240 [P.O.:240] Out: 200 [Urine:200]    Radiology:   CT Angio Chest PE W and/or Wo Contrast  Result Date: 05/05/2020 CLINICAL DATA:  Chest pain and shortness of breath EXAM: CT ANGIOGRAPHY CHEST WITH CONTRAST TECHNIQUE: Multidetector CT imaging of the chest was performed using the standard protocol during bolus administration of intravenous contrast. Multiplanar CT image reconstructions and MIPs were obtained to evaluate the  vascular anatomy. CONTRAST:  38mL OMNIPAQUE IOHEXOL 350 MG/ML SOLN COMPARISON:  Coronary CT April 26, 2020; CT angiogram chest September 02, 2019 FINDINGS: Cardiovascular: There is no demonstrable pulmonary embolus. There is no appreciable thoracic aortic aneurysm or dissection. The visualized great vessels appear normal. There is aortic atherosclerosis. There are foci of coronary artery calcification. There is no pericardial effusion or pericardial thickening. Mediastinum/Nodes: Visualized thyroid appears normal. There are subcentimeter mediastinal lymph nodes. There is no adenopathy meeting size criteria for pathologic significance in the thoracic region. No esophageal lesions are evident. Lungs/Pleura: There are pleural effusions bilaterally with compressive atelectasis in the lung bases. There is airspace consolidation as well in the posterior left base. There is atelectatic change in inferior lingula. There is apparent fluid tracking along the left major fissure. Upper Abdomen: There are several small cystic lesions in the liver, stable. Visualized upper abdominal structures otherwise appear unremarkable. Musculoskeletal: No blastic or lytic bone lesions. No evident chest wall lesions. Review of the MIP images confirms the above findings. IMPRESSION: 1. No demonstrable pulmonary embolus. No thoracic aortic aneurysm or dissection. There are foci of aortic atherosclerosis as well as foci of coronary artery calcification. 2. Bilateral pleural effusions with bibasilar compressive atelectasis. Suspected pneumonia in the posterior left base as well. Atelectatic change in inferior lingula. 3.  No adenopathy evident. Aortic Atherosclerosis (ICD10-I70.0). Electronically Signed   By: Lowella Grip III M.D.   On: 05/05/2020 14:52    Cardiac Studies:   Echocardiogram: 01/26/2020: Hyperdynamic LV systolic function with visual EF >70%. Intraventricular PG of 31 mm Hg detected.  Left ventricle cavity is normal  in  size. Moderate concentric hypertrophy of the left ventricle. Normal global wall motion. Doppler evidence of grade I (impaired) diastolic dysfunction, elevated LAP. Mild (Grade I) aortic regurgitation. Mild (Grade I) mitral regurgitation.   Stress Testing: Lexiscan/modified Bruce Tetrofosmin stress test 02/23/2020: Stress EKG showed sinus tachycardia, inferolateral T wave inversion.  SPECT images show small sized, medium intensity, mid to basal inferolateral perfusion defect with mild reversibility. Stress LVEF 82%. Low risk study.  Coronary CTA 04/26/2020: 1. Coronary calcium score of 1101. This was 89th percentile for age and sex matched control. 2. Normal coronary origin with right dominance. 3. Minimal calcified plaque within the left main artery. Moderate to severe stenosis within the proximal/mid LAD segment to calcified plaque. Severe stenosis at the ostial segment of the 1st diagonal branch due calcified plaque. Severe stenosis within the proximal segment due to eccentric calcified plaque within the LCX. Moderate stenosis within the distal RCA due to calcified plaque. Of note, severity of the stenosis may be overestimated due to blooming artifact caused by calcified plaque. 4. CADRADS = 4. Cardiac catheterization or CT FFR is recommended. Consider symptom-guided anti-ischemic pharmacotherapy as well as risk factor modification per guideline directed care. Invasive coronary angiography recommended with revascularization per published guideline statements. 4. Study is sent for CT-FFR findings will be performed and reported   CT-FFR 04/27/2020: CT FFR analysis showed no significant stenosis.  EKG:  05/06/2020: Normal sinus rhythm at the rate of 80 bpm, normal axis, incomplete right bundle branch block.  ST depression in high lateral and lateral leads suggestive of LVH with repolarization, also suggest ischemia and appears much more pronounced compared to previous EKGs.   PACs.  Telemetry 05/06/2020: 11 beat run of NSVT.  Frequent PVCs.  No atrial fibrillation.  Assessment   1.  Unstable angina 2.  Coronary artery disease by coronary CTA felt to be insignificant on 04/26/2020, coronary calcium score markedly elevated at 1101 A.U. 3.  Small lobar pneumonia left lower lobe 4.  NSVT 5. Acute on chronic diastolic heart failure Medications Discontinued During This Encounter  Medication Reason  . cephALEXin (KEFLEX) capsule 500 mg   . atorvastatin (LIPITOR) tablet 20 mg   . hydrochlorothiazide (HYDRODIURIL) tablet 25 mg   . aspirin EC tablet 81 mg   . furosemide (LASIX) injection 20 mg   . furosemide (LASIX) injection 40 mg   . polyethylene glycol (MIRALAX / GLYCOLAX) packet 17 g     Recommendations:   I will start the patient on aspirin 81 mg daily, continue high intensity statin, discontinue diltiazem and switch him to metoprolol.  Continue IV Lasix as scheduled, he will need cardiac catheterization in view of above findings. Schedule for cardiac catheterization, and possible angioplasty. We discussed regarding risks, benefits, alternatives to this including stress testing, CTA and continued medical therapy. Patient wants to proceed. Understands <1-2% risk of death, stroke, MI, urgent CABG, bleeding, infection, renal failure but not limited to these.    Adrian Prows, MD, [facility] 05/06/2020, 11:15 AM Office: [phone]

## 2020-05-06 NOTE — Consult Note (Signed)
CARDIOLOGY CONSULT NOTE  Patient ID: Bobby [name]. MRN: [id number] DOB/AGE: November 10, 1950 69 y.o.  Admit date: 05/05/2020 Referring Physician Carney Harder, MD Primary Physician:  [name], MD Reason for Consultation  Chest pain and dyspnea  Patient ID: Bobby [name]., male    DOB: [birth year], 69 y.o.   MRN: [id number]  Chief Complaint  Patient presents with  . Chest Pain  . Shortness of Breath   HPI:    Bobby [name].  is a 69 y.o. Coronary artery disease, coronary CTA revealing no hemodynamically significant stenosis on 04/26/2020, severe coronary artery calcification 1101 AU, prediabetes, hyperlipidemia, hypertension, advanced age, former smoker, obesity due to excess calories, chronic dyspnea on exertion presented to the emergency room yesterday morning after he woke up in the morning started feeling sharp pain in the middle of the chest and also going across the chest just below his breast area bilaterally.  He also noticed marked shortness of breath, worsening orthopnea.  Due to back pain he does sleep reclined but he was unable to even lay down due to pain mostly but also shortness of breath.  CT angiogram of the chest due to pleuritic type chest pain on 05/05/2020 was negative but did reveal a small left lower lobe pneumonia and bilateral effusion.  I was consulted for management of this chest pain and elevated BNP suggestive of acute diastolic heart failure.  This morning patient states that he is feeling better but still has shortness of breath and still has chest pain when he takes a deep breath across his chest.  No dizziness or syncope.  He has not noticed any significant leg edema.  Past Medical History:  Diagnosis Date  . Bronchitis   . Coronary artery calcification of native artery   . Family history of pancreatic cancer   . History of kidney stones   . Hyperlipidemia   . Hypertension   . Pneumonia   . Prostate cancer [facility])    Past Surgical History:  Procedure  Laterality Date  . EXTRACORPOREAL SHOCK WAVE LITHOTRIPSY Right 04/17/2017   Procedure: RIGHT EXTRACORPOREAL SHOCK WAVE LITHOTRIPSY (ESWL);  Surgeon: Kathie Rhodes, MD;  Location: [facility];  Service: Urology;  Laterality: Right;  . HERNIA REPAIR     age 77  . PROSTATE BIOPSY     Social History   Tobacco Use  . Smoking status: Former Smoker    Packs/day: 0.25    Years: 10.00    Pack years: 2.50    Types: Cigarettes    Quit date: 01/20/2012    Years since quitting: 8.2  . Smokeless tobacco: Never Used  Substance Use Topics  . Alcohol use: No    Marital Sttus: Married   ROS  Review of Systems  Cardiovascular: Positive for chest pain and dyspnea on exertion. Negative for leg swelling.  Musculoskeletal: Positive for arthritis and back pain.  Gastrointestinal: Negative for melena.  Neurological: Negative.   Psychiatric/Behavioral: Negative.   All other systems reviewed and are negative.  Objective   Vitals with BMI 05/06/2020 05/06/2020 05/06/2020  Height - - -  Weight - - 249 lbs 13 oz  BMI - - 09.98  Systolic 338 - -  Diastolic 72 - -  Pulse 72 82 -    Blood pressure 109/72, pulse 72, temperature 97.8 F (36.6 C), temperature source Oral, resp. rate (!) 26, height 5\' 6"  (1.676 m), weight 113.3 kg, SpO2 96 %. Body mass index is 40.32 kg/m.    Physical Exam Constitutional:  Comments: Morbidly obese in no acute distress.  Cardiovascular:     Rate and Rhythm: Normal rate and regular rhythm.     Pulses:          Carotid pulses are 2+ on the right side and 2+ on the left side.      Dorsalis pedis pulses are 2+ on the right side and 2+ on the left side.       Posterior tibial pulses are 2+ on the right side and 2+ on the left side.     Heart sounds: Murmur heard.  Early systolic murmur is present with a grade of 2/6 at the upper right sternal border.  No gallop.      Comments: Femoral and popliteal pulse difficult to feel due to patient's body habitus.  2+ bilateral below  knee pitting leg edema.  JVD difficult to see due to short neck. Pulmonary:     Effort: Pulmonary effort is normal.     Breath sounds: Examination of the right-lower field reveals rales. Examination of the left-lower field reveals rales. Rales present.  Abdominal:     General: Bowel sounds are normal.     Palpations: Abdomen is soft.     Comments: Obese.     Laboratory examination:   Recent Labs    04/19/20 0856 05/05/20 1243 05/06/20 0303  NA 140 137 138  K 4.0 3.3* 3.0*  CL 101 102 98  CO2 23 25 26   GLUCOSE 142* 159* 185*  BUN 18 20 18   CREATININE 1.07 0.88 1.08  CALCIUM 9.3 9.0 9.3  GFRNONAA 71 >60 >60  GFRAA 82 >60 >60   estimated creatinine clearance is 77.4 mL/min (by C-G formula based on SCr of 1.08 mg/dL).  CMP Latest Ref Rng & Units 05/06/2020 05/05/2020 04/19/2020  Glucose 70 - 99 mg/dL 185(H) 159(H) 142(H)  BUN 8 - 23 mg/dL 18 20 18   Creatinine 0.61 - 1.24 mg/dL 1.08 0.88 1.07  Sodium 135 - 145 mmol/L 138 137 140  Potassium 3.5 - 5.1 mmol/L 3.0(L) 3.3(L) 4.0  Chloride 98 - 111 mmol/L 98 102 101  CO2 22 - 32 mmol/L 26 25 23   Calcium 8.9 - 10.3 mg/dL 9.3 9.0 9.3  Total Protein 6.5 - 8.1 g/dL 6.9 7.0 -  Total Bilirubin 0.3 - 1.2 mg/dL 1.0 0.6 -  Alkaline Phos 38 - 126 U/L 73 72 -  AST 15 - 41 U/L 13(L) 16 -  ALT 0 - 44 U/L 15 15 -   CBC Latest Ref Rng & Units 05/06/2020 05/05/2020 02/16/2020  WBC 4.0 - 10.5 K/uL 16.2(H) 14.3(H) 7.8  Hemoglobin 13.0 - 17.0 g/dL 12.4(L) 12.6(L) 13.2  Hematocrit 39 - 52 % 38.1(L) 38.0(L) 39.5  Platelets 150 - 400 K/uL 254 235 233   Lipid Panel Recent Labs    03/16/20 0843  CHOL 122  TRIG 123  LDLCALC 55  HDL 45    HEMOGLOBIN A1C Lab Results  Component Value Date   HGBA1C 5.8 (H) 05/06/2020   MPG 119.76 05/06/2020   TSH Recent Labs    05/06/20 0131  TSH 1.483   BNP (last 3 results) Recent Labs    09/02/19 1642 05/05/20 1243  BNP 93.8 347.5*    Medications and allergies  No Known Allergies   Current Meds   Medication Sig  . abiraterone acetate (ZYTIGA) 250 MG tablet TAKE 4 TABLETS (1,000 MG TOTAL) BY MOUTH DAILY. TAKE ON AN EMPTY STOMACH, 1 HOUR BEFORE OR 2 HOURS AFTER A MEAL.  Marland Kitchen  atorvastatin (LIPITOR) 20 MG tablet Take 20 mg by mouth daily.  . hydrochlorothiazide (HYDRODIURIL) 25 MG tablet Take 1 tablet (25 mg total) by mouth in the morning.  Marland Kitchen losartan (COZAAR) 50 MG tablet Take 1 tablet (50 mg total) by mouth every evening.  Marland Kitchen MYRBETRIQ 25 MG TB24 tablet Take 25 mg by mouth daily.  . predniSONE (DELTASONE) 5 MG tablet TAKE 1 TABLET BY MOUTH DAILY WITH BREAKFAST (Patient taking differently: Take 5 mg by mouth daily with breakfast. )    Scheduled Meds: . abiraterone acetate  1,000 mg Oral Daily  . azithromycin  500 mg Oral Daily  . darifenacin  7.5 mg Oral Daily  . diltiazem  240 mg Oral Daily  . enoxaparin (LOVENOX) injection  40 mg Subcutaneous Q24H  . furosemide  60 mg Intravenous BID  . losartan  50 mg Oral QPM  . mirabegron ER  25 mg Oral Daily  . polyethylene glycol  17 g Oral Daily  . potassium chloride  40 mEq Oral Q4H  . predniSONE  5 mg Oral Q breakfast  . sodium chloride flush  3 mL Intravenous Q12H  . tamsulosin  0.4 mg Oral QPC supper   Continuous Infusions: . sodium chloride    . cefTRIAXone (ROCEPHIN)  IV 1 g (05/06/20 0859)   PRN Meds:.sodium chloride, acetaminophen, meloxicam, methocarbamol, ondansetron (ZOFRAN) IV, sodium chloride flush, traMADol   I/O last 3 completed shifts: In: 72 [P.O.:477] Out: 1700 [Urine:1700] Total I/O In: 240 [P.O.:240] Out: 200 [Urine:200]    Radiology:   CT Angio Chest PE W and/or Wo Contrast  Result Date: 05/05/2020 CLINICAL DATA:  Chest pain and shortness of breath EXAM: CT ANGIOGRAPHY CHEST WITH CONTRAST TECHNIQUE: Multidetector CT imaging of the chest was performed using the standard protocol during bolus administration of intravenous contrast. Multiplanar CT image reconstructions and MIPs were obtained to evaluate the  vascular anatomy. CONTRAST:  64mL OMNIPAQUE IOHEXOL 350 MG/ML SOLN COMPARISON:  Coronary CT April 26, 2020; CT angiogram chest September 02, 2019 FINDINGS: Cardiovascular: There is no demonstrable pulmonary embolus. There is no appreciable thoracic aortic aneurysm or dissection. The visualized great vessels appear normal. There is aortic atherosclerosis. There are foci of coronary artery calcification. There is no pericardial effusion or pericardial thickening. Mediastinum/Nodes: Visualized thyroid appears normal. There are subcentimeter mediastinal lymph nodes. There is no adenopathy meeting size criteria for pathologic significance in the thoracic region. No esophageal lesions are evident. Lungs/Pleura: There are pleural effusions bilaterally with compressive atelectasis in the lung bases. There is airspace consolidation as well in the posterior left base. There is atelectatic change in inferior lingula. There is apparent fluid tracking along the left major fissure. Upper Abdomen: There are several small cystic lesions in the liver, stable. Visualized upper abdominal structures otherwise appear unremarkable. Musculoskeletal: No blastic or lytic bone lesions. No evident chest wall lesions. Review of the MIP images confirms the above findings. IMPRESSION: 1. No demonstrable pulmonary embolus. No thoracic aortic aneurysm or dissection. There are foci of aortic atherosclerosis as well as foci of coronary artery calcification. 2. Bilateral pleural effusions with bibasilar compressive atelectasis. Suspected pneumonia in the posterior left base as well. Atelectatic change in inferior lingula. 3.  No adenopathy evident. Aortic Atherosclerosis (ICD10-I70.0). Electronically Signed   By: Lowella Grip III M.D.   On: 05/05/2020 14:52    Cardiac Studies:   Echocardiogram: 01/26/2020: Hyperdynamic LV systolic function with visual EF >70%. Intraventricular PG of 31 mm Hg detected.  Left ventricle cavity is normal  in  size. Moderate concentric hypertrophy of the left ventricle. Normal global wall motion. Doppler evidence of grade I (impaired) diastolic dysfunction, elevated LAP. Mild (Grade I) aortic regurgitation. Mild (Grade I) mitral regurgitation.   Stress Testing: Lexiscan/modified Bruce Tetrofosmin stress test 02/23/2020: Stress EKG showed sinus tachycardia, inferolateral T wave inversion.  SPECT images show small sized, medium intensity, mid to basal inferolateral perfusion defect with mild reversibility. Stress LVEF 82%. Low risk study.  Coronary CTA 04/26/2020: 1. Coronary calcium score of 1101. This was 89th percentile for age and sex matched control. 2. Normal coronary origin with right dominance. 3. Minimal calcified plaque within the left main artery. Moderate to severe stenosis within the proximal/mid LAD segment to calcified plaque. Severe stenosis at the ostial segment of the 1st diagonal branch due calcified plaque. Severe stenosis within the proximal segment due to eccentric calcified plaque within the LCX. Moderate stenosis within the distal RCA due to calcified plaque. Of note, severity of the stenosis may be overestimated due to blooming artifact caused by calcified plaque. 4. CADRADS = 4. Cardiac catheterization or CT FFR is recommended. Consider symptom-guided anti-ischemic pharmacotherapy as well as risk factor modification per guideline directed care. Invasive coronary angiography recommended with revascularization per published guideline statements. 4. Study is sent for CT-FFR findings will be performed and reported   CT-FFR 04/27/2020: CT FFR analysis showed no significant stenosis.  EKG:  05/06/2020: Normal sinus rhythm at the rate of 80 bpm, normal axis, incomplete right bundle branch block.  ST depression in high lateral and lateral leads suggestive of LVH with repolarization, also suggest ischemia and appears much more pronounced compared to previous EKGs.   PACs.  Telemetry 05/06/2020: 11 beat run of NSVT.  Frequent PVCs.  No atrial fibrillation.  Assessment   1.  Unstable angina 2.  Coronary artery disease by coronary CTA felt to be insignificant on 04/26/2020, coronary calcium score markedly elevated at 1101 A.U. 3.  Small lobar pneumonia left lower lobe 4.  NSVT 5. Acute on chronic diastolic heart failure Medications Discontinued During This Encounter  Medication Reason  . cephALEXin (KEFLEX) capsule 500 mg   . atorvastatin (LIPITOR) tablet 20 mg   . hydrochlorothiazide (HYDRODIURIL) tablet 25 mg   . aspirin EC tablet 81 mg   . furosemide (LASIX) injection 20 mg   . furosemide (LASIX) injection 40 mg   . polyethylene glycol (MIRALAX / GLYCOLAX) packet 17 g     Recommendations:   I will start the patient on aspirin 81 mg daily, continue high intensity statin, discontinue diltiazem and switch him to metoprolol.  Continue IV Lasix as scheduled, he will need cardiac catheterization in view of above findings. Schedule for cardiac catheterization, and possible angioplasty. We discussed regarding risks, benefits, alternatives to this including stress testing, CTA and continued medical therapy. Patient wants to proceed. Understands <1-2% risk of death, stroke, MI, urgent CABG, bleeding, infection, renal failure but not limited to these.    Adrian Prows, MD, [facility] 05/06/2020, 11:15 AM Office: [phone]

## 2020-05-06 NOTE — Progress Notes (Signed)
PROGRESS NOTE    Bobby [name].  JKD:[id number] DOB: 11-27-1950 DOA: 05/05/2020 PCP: [name], MD   Brief Narrative: 69 year old with past medical history significant for hypertension, prostate cancer on Zytiga,  hyperlipidemia, obesity, CAD who presents complaining of worsening shortness of breath.  Poor worsening shortness of breath over the last 3 days prior to admission also report right side chest pain, sharp in quality, pleuritic. In the emergency department he was noted to be in respiratory distress, improved with oxygen supplementation.  CT angio was negative for PE, revealed bilateral pleural effusion with compressive atelectasis. EKG suggestive of A. Fib.  Cardiology reviewed initial EKG and thought patient was in sinus rhythm.  Patient admitted with CHF exacerbation, diastolic.  Assessment & Plan:   Principal Problem:   Acute on chronic diastolic CHF (congestive heart failure) (HCC) Active Problems:   Malignant neoplasm of prostate (HCC)   Mixed hyperlipidemia   Essential hypertension   Atrial fibrillation (HCC)   Hypokalemia   Prediabetes   Leukocytosis   Coronary artery disease involving native coronary artery of native heart   Atypical chest pain  1-Acute on Chronic Diastolic  heart failure exacerbation: Present with dyspnea, B/L pleural effusion, elevated BNP.  Continue with IV lasix.  Cardiology consulted.  Strict I and O. Daily weight.  ECHO; EF 60 %, Diastolic  Dysfunction grade 2.  Plan for cath on Monday   2-NSVT;  Replete mg. And potassium/   3-Bilateral Pleural effusion, PNA;  Leukocytosis.  Started ceftriaxone and azithromycin for 5 days.   4-Chest pain; CAD appreciate cardiology evaluation.  On metoprolol, aspirin, statin.  Report pleuritic chest pain.   5-HTN; on metoprolol/   6-Hypokalemia;  Replete orally.   Pre-Diabetes; SSI./   Malignant Neoplasm of Prostate:  Zytiga      Estimated body mass index is 40.32 kg/m as  calculated from the following:   Height as of this encounter: 5\' 6"  (1.676 m).   Weight as of this encounter: 113.3 kg.   DVT prophylaxis: Lovenox Code Status: Full code Family Communication: care discussed with patient Disposition Plan:  Status is: Inpatient  Remains inpatient appropriate because:Ongoing active pain requiring inpatient pain management   Dispo: The patient is from: Home              Anticipated d/c is to: Home              Anticipated d/c date is: 3 days              Patient currently is not medically stable to d/c.        Consultants:   Cardiology   Procedures:   ECHO  Antimicrobials:    Subjective: Alert, report pleuritic chest pain, still SOB but improved.  Denies cough   Objective: Vitals:   05/05/20 2313 05/06/20 0353 05/06/20 0356 05/06/20 0400  BP: (!) 156/73 122/77    Pulse: 93 79  82  Resp: (!) 25 (!) 21    Temp: 99.3 F (37.4 C) 99.5 F (37.5 C)    TempSrc: Oral Oral    SpO2: 98% 92%    Weight:   113.3 kg   Height:        Intake/Output Summary (Last 24 hours) at 05/06/2020 0706 Last data filed at 05/06/2020 0423 Gross per 24 hour  Intake 477 ml  Output 1700 ml  Net -1223 ml   Filed Weights   05/05/20 1300 05/06/20 0356  Weight: 112.9 kg 113.3 kg    Examination:  General exam: Appears calm and comfortable  Respiratory system: B/L crackles. Marland Kitchen Respiratory effort normal. Cardiovascular system: S1 & S2 heard, RRR. No JVD, murmurs, rubs, gallops or clicks. No pedal edema. Gastrointestinal system: Abdomen is nondistended, soft and nontender. No organomegaly or masses felt. Normal bowel sounds heard. Central nervous system: Alert and oriented. No focal neurological deficits. Extremities: Symmetric 5 x 5 power.   Data Reviewed: I have personally reviewed following labs and imaging studies  CBC: Recent Labs  Lab 05/05/20 1243 05/06/20 0303  WBC 14.3* 16.2*  NEUTROABS 12.7* 13.9*  HGB 12.6* 12.4*  HCT 38.0* 38.1*    MCV 86.2 84.1  PLT 235 170   Basic Metabolic Panel: Recent Labs  Lab 05/05/20 1243 05/05/20 1547 05/06/20 0303  NA 137  --  138  K 3.3*  --  3.0*  CL 102  --  98  CO2 25  --  26  GLUCOSE 159*  --  185*  BUN 20  --  18  CREATININE 0.88  --  1.08  CALCIUM 9.0  --  9.3  MG  --  1.9 1.9   GFR: Estimated Creatinine Clearance: 77.4 mL/min (by C-G formula based on SCr of 1.08 mg/dL). Liver Function Tests: Recent Labs  Lab 05/05/20 1243 05/06/20 0303  AST 16 13*  ALT 15 15  ALKPHOS 72 73  BILITOT 0.6 1.0  PROT 7.0 6.9  ALBUMIN 3.5 3.2*   No results for input(s): LIPASE, AMYLASE in the last 168 hours. No results for input(s): AMMONIA in the last 168 hours. Coagulation Profile: Recent Labs  Lab 05/06/20 0303  INR 1.0   Cardiac Enzymes: No results for input(s): CKTOTAL, CKMB, CKMBINDEX, TROPONINI in the last 168 hours. BNP (last 3 results) No results for input(s): PROBNP in the last 8760 hours. HbA1C: Recent Labs    05/06/20 0131  HGBA1C 5.8*   CBG: No results for input(s): GLUCAP in the last 168 hours. Lipid Profile: No results for input(s): CHOL, HDL, LDLCALC, TRIG, CHOLHDL, LDLDIRECT in the last 72 hours. Thyroid Function Tests: Recent Labs    05/06/20 0131  TSH 1.483   Anemia Panel: No results for input(s): VITAMINB12, FOLATE, FERRITIN, TIBC, IRON, RETICCTPCT in the last 72 hours. Sepsis Labs: Recent Labs  Lab 05/06/20 0131  PROCALCITON <0.10    Recent Results (from the past 240 hour(s))  SARS Coronavirus 2 by RT PCR (hospital order, performed in [facility] hospital lab) Nasopharyngeal Nasopharyngeal Swab     Status: None   Collection Time: 05/05/20  1:40 PM   Specimen: Nasopharyngeal Swab  Result Value Ref Range Status   SARS Coronavirus 2 NEGATIVE NEGATIVE Final    Comment: (NOTE) SARS-CoV-2 target nucleic acids are NOT DETECTED.  The SARS-CoV-2 RNA is generally detectable in upper and lower respiratory specimens during the acute phase of  infection. The lowest concentration of SARS-CoV-2 viral copies this assay can detect is 250 copies / mL. A negative result does not preclude SARS-CoV-2 infection and should not be used as the sole basis for treatment or other patient management decisions.  A negative result may occur with improper specimen collection / handling, submission of specimen other than nasopharyngeal swab, presence of viral mutation(s) within the areas targeted by this assay, and inadequate number of viral copies (<250 copies / mL). A negative result must be combined with clinical observations, patient history, and epidemiological information.  Fact Sheet for Patients:   StrictlyIdeas.no  Fact Sheet for Healthcare Providers: BankingDealers.co.za  This test is not  yet approved or  cleared by the [place] and has been authorized for detection and/or diagnosis of SARS-CoV-2 by FDA under an Emergency Use Authorization (EUA).  This EUA will remain in effect (meaning this test can be used) for the duration of the COVID-19 declaration under Section 564(b)(1) of the Act, 21 U.S.C. section 360bbb-3(b)(1), unless the authorization is terminated or revoked sooner.  Performed at [facility], [address] [place], [place] [address]          Radiology Studies: CT Angio Chest PE W and/or Wo Contrast  Result Date: 05/05/2020 CLINICAL DATA:  Chest pain and shortness of breath EXAM: CT ANGIOGRAPHY CHEST WITH CONTRAST TECHNIQUE: Multidetector CT imaging of the chest was performed using the standard protocol during bolus administration of intravenous contrast. Multiplanar CT image reconstructions and MIPs were obtained to evaluate the vascular anatomy. CONTRAST:  39mL OMNIPAQUE IOHEXOL 350 MG/ML SOLN COMPARISON:  Coronary CT April 26, 2020; CT angiogram chest September 02, 2019 FINDINGS: Cardiovascular: There is no demonstrable pulmonary embolus. There  is no appreciable thoracic aortic aneurysm or dissection. The visualized great vessels appear normal. There is aortic atherosclerosis. There are foci of coronary artery calcification. There is no pericardial effusion or pericardial thickening. Mediastinum/Nodes: Visualized thyroid appears normal. There are subcentimeter mediastinal lymph nodes. There is no adenopathy meeting size criteria for pathologic significance in the thoracic region. No esophageal lesions are evident. Lungs/Pleura: There are pleural effusions bilaterally with compressive atelectasis in the lung bases. There is airspace consolidation as well in the posterior left base. There is atelectatic change in inferior lingula. There is apparent fluid tracking along the left major fissure. Upper Abdomen: There are several small cystic lesions in the liver, stable. Visualized upper abdominal structures otherwise appear unremarkable. Musculoskeletal: No blastic or lytic bone lesions. No evident chest wall lesions. Review of the MIP images confirms the above findings. IMPRESSION: 1. No demonstrable pulmonary embolus. No thoracic aortic aneurysm or dissection. There are foci of aortic atherosclerosis as well as foci of coronary artery calcification. 2. Bilateral pleural effusions with bibasilar compressive atelectasis. Suspected pneumonia in the posterior left base as well. Atelectatic change in inferior lingula. 3.  No adenopathy evident. Aortic Atherosclerosis (ICD10-I70.0). Electronically Signed   By: Lowella Grip III M.D.   On: 05/05/2020 14:52        Scheduled Meds: . abiraterone acetate  1,000 mg Oral Daily  . azithromycin  500 mg Oral Daily  . darifenacin  7.5 mg Oral Daily  . diltiazem  240 mg Oral Daily  . enoxaparin (LOVENOX) injection  40 mg Subcutaneous Q24H  . furosemide  60 mg Intravenous BID  . losartan  50 mg Oral QPM  . mirabegron ER  25 mg Oral Daily  . predniSONE  5 mg Oral Q breakfast  . sodium chloride flush  3 mL  Intravenous Q12H  . tamsulosin  0.4 mg Oral QPC supper   Continuous Infusions: . sodium chloride    . cefTRIAXone (ROCEPHIN)  IV       LOS: 1 day    Time spent: 35 minutes.     Elmarie Shiley, MD [facility]   If 7PM-7AM, please contact night-coverage [email or web address]  05/06/2020, 7:06 AM

## 2020-05-06 NOTE — H&P (Signed)
History and Physical    Bobby [name]. OEV:[id number] DOB: 03/04/1951 DOA: 05/05/2020  PCP: [name], MD  Patient coming from: [facility]   Chief Complaint:  Chief Complaint  Patient presents with  . Chest Pain  . Shortness of Breath     HPI:    69 year old male with past medical history of hypertension, prostate cancer, hyperlipidemia, obesity, coronary artery disease (based on CTA coronaries performed  04/27/2020) who presents to [place] emergency department with complaints of shortness of breath.  Of note, patient is an extremely poor historian.  Patient explains that approximately 3 days ago he began to experience significant shortness of breath.  The shortness of breath was severe in intensity, worse with minimal exertion and improved with rest.  Patient is not entirely forthcoming on this however patient seems to have also been experiencing progressively worsening shortness of breath with exertion over the past several weeks.  In the past 3 days, patient has developed associated paroxysmal nocturnal dyspnea and pillow orthopnea.  Patient is also complaining of right-sided chest pain, moderate in intensity, sharp in quality, occurring with deep inspiration or movement.  Patient denies significant leg swelling or weight gain.  Patient denies fevers, cough, fevers, sick contacts, recent travel or confirmed contact with COVID-19 infection.  Of note, patient has undergone COVID-19 vaccination earlier this year.  Patient symptoms continue to worsen until he eventually presented to [place] emergency department for evaluation.  On evaluation at [facility] emergency department patient was found to be in respiratory distress.  Patient was placed on supplemental oxygen, CT angiogram of the chest was negative for pulmonary embolism but did reveal bilateral pleural effusions with compressive atelectasis.  Patient was administered 40 mg of IV Lasix in  addition to a dose of Keflex and azithromycin.  Case was also discussed with Dr. Einar Gip due to concerns for atrial fibrillation on EKG.  Dr. Einar Gip felt that these findings may be more consistent with sinus rhythm.  The hospitalist group has been called at [facility] for acceptance or management of suspected acute congestive heart failure with possible atrial fibrillation.     Review of Systems:  Review of Systems  Respiratory: Positive for shortness of breath.   Neurological: Positive for weakness.  All other systems reviewed and are negative.    Past Medical History:  Diagnosis Date  . Bronchitis   . Coronary artery calcification of native artery   . Family history of pancreatic cancer   . History of kidney stones   . Hyperlipidemia   . Hypertension   . Pneumonia   . Prostate cancer [facility])     Past Surgical History:  Procedure Laterality Date  . EXTRACORPOREAL SHOCK WAVE LITHOTRIPSY Right 04/17/2017   Procedure: RIGHT EXTRACORPOREAL SHOCK WAVE LITHOTRIPSY (ESWL);  Surgeon: Kathie Rhodes, MD;  Location: [facility] ORS;  Service: Urology;  Laterality: Right;  . HERNIA REPAIR     age 3  . PROSTATE BIOPSY       reports that he quit smoking about 8 years ago. His smoking use included cigarettes. He has a 2.50 pack-year smoking history. He has never used smokeless tobacco. He reports that he does not drink alcohol and does not use drugs.  No Known Allergies  Family History  Problem Relation Age of Onset  . Breast cancer Sister 16       "negative genetic testing"  . Pancreatic cancer Brother 54  . Breast cancer Maternal Aunt   .  Kidney cancer Maternal Aunt        dx 28s     Prior to Admission medications   Medication Sig Start Date End Date Taking? Authorizing Provider  abiraterone acetate (ZYTIGA) 250 MG tablet TAKE 4 TABLETS (1,000 MG TOTAL) BY MOUTH DAILY. TAKE ON AN EMPTY STOMACH, 1 HOUR BEFORE OR 2 HOURS AFTER A MEAL. 06/01/19  Yes Wyatt Portela, MD  atorvastatin  (LIPITOR) 20 MG tablet Take 20 mg by mouth daily.   Yes [provider]  hydrochlorothiazide (HYDRODIURIL) 25 MG tablet Take 1 tablet (25 mg total) by mouth in the morning. 03/03/20 06/01/20 Yes Tolia, Sunit, DO  losartan (COZAAR) 50 MG tablet Take 1 tablet (50 mg total) by mouth every evening. 03/03/20 06/01/20 Yes Tolia, Sunit, DO  MYRBETRIQ 25 MG TB24 tablet Take 25 mg by mouth daily. 06/03/19  Yes [provider]  predniSONE (DELTASONE) 5 MG tablet TAKE 1 TABLET BY MOUTH DAILY WITH BREAKFAST 02/24/20  Yes Wyatt Portela, MD  aspirin EC 81 MG tablet Take 1 tablet (81 mg total) by mouth daily. 01/21/20   Tolia, Sunit, DO  diltiazem (CARDIZEM CD) 240 MG 24 hr capsule Take 1 capsule (240 mg total) by mouth daily. 04/04/20 05/04/20  Tolia, Sunit, DO  meloxicam (MOBIC) 7.5 MG tablet Take 7.5 mg by mouth 2 (two) times daily as needed. 02/21/20   [provider]  methocarbamol (ROBAXIN) 500 MG tablet Take 1 tablet by mouth daily as needed.    [provider]  Multiple Vitamins-Minerals (MULTIVITAMIN WITH MINERALS) tablet Take 1 tablet by mouth daily.    [provider]  solifenacin (VESICARE) 5 MG tablet Take 5 mg by mouth daily. 04/03/20   [provider]  tamsulosin (FLOMAX) 0.4 MG CAPS capsule Take 1 capsule (0.4 mg total) by mouth daily after supper. 04/17/17   Kathie Rhodes, MD  traMADol (ULTRAM) 50 MG tablet Take 50 mg by mouth 2 (two) times daily as needed. 03/29/20   [provider]    Physical Exam: Vitals:   05/05/20 2030 05/05/20 2130 05/05/20 2200 05/05/20 2313  BP: 136/77 108/64 (!) 144/78 (!) 156/73  Pulse: 94 85 87 93  Resp: 19 19 (!) 22 (!) 25  Temp:   98.4 F (36.9 C) 99.3 F (37.4 C)  TempSrc:   Oral Oral  SpO2: 95% 97% 98% 98%  Weight:      Height:        Constitutional: Acute alert and oriented x3, patient is in respiratory distress.  Patient is obese. Skin: no rashes, no lesions, good skin turgor noted. Eyes: Pupils  are equally reactive to light.  No evidence of scleral icterus or conjunctival pallor.  ENMT: Moist mucous membranes noted.  Posterior pharynx clear of any exudate or lesions.   Neck: normal, supple, no masses, no thyromegaly.  No evidence of jugular venous distension.   Respiratory: Diminished breath sounds at the bases with bibasilar rales.  No evidence of wheezing.  Normal respiratory effort. No accessory muscle use.  Cardiovascular: Regular rate and rhythm, no murmurs / rubs / gallops.  Significant bilateral distal lower extremity pitting edema.  2+ pedal pulses. No carotid bruits.  Chest:   Nontender without crepitus or deformity.   Back:   Nontender without crepitus or deformity. Abdomen: Extremely protuberant abdomen noted that is soft and nontender.  No evidence of intra-abdominal masses.  Positive bowel sounds noted in all quadrants.   Musculoskeletal: No joint deformity upper and lower extremities. Good ROM,  no contractures. Normal muscle tone.  Neurologic: CN 2-12 grossly intact. Sensation intact, strength noted to be 5 out of 5 in all 4 extremities.  Patient is following all commands.  Patient is responsive to verbal stimuli.   Psychiatric: Patient presents as a normal mood with appropriate affect.  Patient seems to possess insight as to theircurrent situation.     Labs on Admission: I have personally reviewed following labs and imaging studies -   CBC: Recent Labs  Lab 05/05/20 1243  WBC 14.3*  NEUTROABS 12.7*  HGB 12.6*  HCT 38.0*  MCV 86.2  PLT 570   Basic Metabolic Panel: Recent Labs  Lab 05/05/20 1243 05/05/20 1547  NA 137  --   K 3.3*  --   CL 102  --   CO2 25  --   GLUCOSE 159*  --   BUN 20  --   CREATININE 0.88  --   CALCIUM 9.0  --   MG  --  1.9   GFR: Estimated Creatinine Clearance: 94.8 mL/min (by C-G formula based on SCr of 0.88 mg/dL). Liver Function Tests: Recent Labs  Lab 05/05/20 1243  AST 16  ALT 15  ALKPHOS 72  BILITOT 0.6  PROT 7.0   ALBUMIN 3.5   No results for input(s): LIPASE, AMYLASE in the last 168 hours. No results for input(s): AMMONIA in the last 168 hours. Coagulation Profile: No results for input(s): INR, PROTIME in the last 168 hours. Cardiac Enzymes: No results for input(s): CKTOTAL, CKMB, CKMBINDEX, TROPONINI in the last 168 hours. BNP (last 3 results) No results for input(s): PROBNP in the last 8760 hours. HbA1C: Recent Labs    05/06/20 0131  HGBA1C 5.8*   CBG: No results for input(s): GLUCAP in the last 168 hours. Lipid Profile: No results for input(s): CHOL, HDL, LDLCALC, TRIG, CHOLHDL, LDLDIRECT in the last 72 hours. Thyroid Function Tests: No results for input(s): TSH, T4TOTAL, FREET4, T3FREE, THYROIDAB in the last 72 hours. Anemia Panel: No results for input(s): VITAMINB12, FOLATE, FERRITIN, TIBC, IRON, RETICCTPCT in the last 72 hours. Urine analysis: No results found for: COLORURINE, APPEARANCEUR, LABSPEC, PHURINE, GLUCOSEU, HGBUR, BILIRUBINUR, KETONESUR, PROTEINUR, UROBILINOGEN, NITRITE, LEUKOCYTESUR  Radiological Exams on Admission - Personally Reviewed: CT Angio Chest PE W and/or Wo Contrast  Result Date: 05/05/2020 CLINICAL DATA:  Chest pain and shortness of breath EXAM: CT ANGIOGRAPHY CHEST WITH CONTRAST TECHNIQUE: Multidetector CT imaging of the chest was performed using the standard protocol during bolus administration of intravenous contrast. Multiplanar CT image reconstructions and MIPs were obtained to evaluate the vascular anatomy. CONTRAST:  45mL OMNIPAQUE IOHEXOL 350 MG/ML SOLN COMPARISON:  Coronary CT April 26, 2020; CT angiogram chest September 02, 2019 FINDINGS: Cardiovascular: There is no demonstrable pulmonary embolus. There is no appreciable thoracic aortic aneurysm or dissection. The visualized great vessels appear normal. There is aortic atherosclerosis. There are foci of coronary artery calcification. There is no pericardial effusion or pericardial thickening.  Mediastinum/Nodes: Visualized thyroid appears normal. There are subcentimeter mediastinal lymph nodes. There is no adenopathy meeting size criteria for pathologic significance in the thoracic region. No esophageal lesions are evident. Lungs/Pleura: There are pleural effusions bilaterally with compressive atelectasis in the lung bases. There is airspace consolidation as well in the posterior left base. There is atelectatic change in inferior lingula. There is apparent fluid tracking along the left major fissure. Upper Abdomen: There are several small cystic lesions in the liver, stable. Visualized upper abdominal structures otherwise appear unremarkable. Musculoskeletal: No blastic or lytic  bone lesions. No evident chest wall lesions. Review of the MIP images confirms the above findings. IMPRESSION: 1. No demonstrable pulmonary embolus. No thoracic aortic aneurysm or dissection. There are foci of aortic atherosclerosis as well as foci of coronary artery calcification. 2. Bilateral pleural effusions with bibasilar compressive atelectasis. Suspected pneumonia in the posterior left base as well. Atelectatic change in inferior lingula. 3.  No adenopathy evident. Aortic Atherosclerosis (ICD10-I70.0). Electronically Signed   By: Lowella Grip III M.D.   On: 05/05/2020 14:52    EKG: Personally reviewed.  Rhythm is atrial fibrillation with heart rate of 83 bpm.  No dynamic ST segment changes appreciated.  Assessment/Plan Principal Problem:   Acute on chronic diastolic CHF (congestive heart failure) ([place])   Presenting with several days of progressively worsening shortness of breath, dyspnea on exertion, paroxysmal nocturnal dyspnea, pillow orthopnea and increasing abdominal girth.  BNP noted to be elevated with evidence of bilateral pleural effusions on chest imaging.  Patient's presentation is concerning for acute congestive heart failure  Initiating trial of intravenous Lasix therapy.  Patient does not  seem to be responding significantly to 40 mg so therefore we will transition the patient to 60 mg twice daily.  Obtaining repeat echocardiogram in the morning  Patient last had echocardiogram in May 2021 which revealed a preserved ejection fraction of greater than 09% with diastolic dysfunction.  Strict input and output monitoring  Monitoring renal function and electrolytes with serial chemistries  Considering recent diagnosis of multivessel coronary artery disease via CT coronary angiography in addition to question of atrial fibrillation will consult cardiology in the morning.  Active Problems:   Atrial fibrillation (HCC)   Serial EKGs as well as telemetry all report atrial fibrillation.  Based on my read of EKGs however there are discernible P waves however rhythm is irregularly irregular.  Per emergency department discussion with Dr. Einar Gip he felt that patient may actually be in normal sinus rhythm.  Patient is rate controlled  Presence of rate controlled atrial fibrillation is unlikely to have precipitated patient's bilateral pleural effusions and volume overload with a preserved ejection fraction.  Patient is already on a substantial regimen of extended release Cardizem in the home setting for hypertension which will be continued.  We will ask cardiology for formal consultation on day shift for their opinion on whether patient is truly in atrial fibrillation  Will abstain from full dose anticoagulation at this time until cardiology evaluates the patient.    Atypical chest pain   Patient complaining of atypical chest discomfort over the past several days since the development of shortness of breath  Cycling cardiac enzymes which have been negative so far  Monitoring patient on telemetry  Patient is a known history of multivessel coronary artery disease based on recent CT angiography earlier this month which is being managed medically.  Asking cardiology to evaluate patient  in the morning.    Essential hypertension   Continue home regimen of antihypertensive therapy    Hypokalemia   Replacing with oral potassium chloride  Monitoring potassium levels with serial chemistries    Prediabetes   Known history of prediabetes with evidence of hyperglycemia on arrival  We will obtain hemoglobin A1c    Leukocytosis   Notable leukocytosis however patient exhibits no evidence of concurrent fever  Patient is on daily prednisone therapy which could be contributing.  Obtaining procalcitonin, CRP and blood cultures.  Abstaining from further antibiotic dosing at this time    Coronary artery disease involving native  coronary artery of native heart   Recent diagnosis of multivessel coronary artery disease via CT coronary angiography performed earlier in the month although it has been recommended the patient be managed medically.  Due to patient's presentation with volume overload as well as atypical chest discomfort, will ask cardiology to opine on whether cardiac catheterization may be warranted.    Malignant neoplasm of prostate ([place])   Continue home regimen of Zytiga  Continue Flomax  Outpatient follow-up    Mixed hyperlipidemia  Continue statin therapy   Code Status:  Full code Family Communication: deferred   Status is: Inpatient  Remains inpatient appropriate because:IV treatments appropriate due to intensity of illness or inability to take PO and Inpatient level of care appropriate due to severity of illness   Dispo: The patient is from: Home              Anticipated d/c is to: Home              Anticipated d/c date is: > 3 days              Patient currently is not medically stable to d/c.        Vernelle Emerald MD [facility] Pager [phone]  If 7PM-7AM, please contact night-coverage [email or web address] Use universal [place] password for that web site. If you do not have the password, please call the hospital  operator.  05/06/2020, 2:25 AM

## 2020-05-07 LAB — BASIC METABOLIC PANEL
Anion gap: 12 (ref 5–15)
BUN: 27 mg/dL — ABNORMAL HIGH (ref 8–23)
CO2: 23 mmol/L (ref 22–32)
Calcium: 9.2 mg/dL (ref 8.9–10.3)
Chloride: 98 mmol/L (ref 98–111)
Creatinine, Ser: 1.08 mg/dL (ref 0.61–1.24)
GFR calc Af Amer: >60 mL/min (ref >60)
GFR calc non Af Amer: >60 mL/min (ref >60)
Glucose, Bld: 137 mg/dL — ABNORMAL HIGH (ref 70–99)
Potassium: 4 mmol/L (ref 3.5–5.1)
Sodium: 133 mmol/L — ABNORMAL LOW (ref 135–145)

## 2020-05-07 LAB — URINE CULTURE: Culture: NO GROWTH

## 2020-05-07 LAB — CBC
HCT: 36 % — ABNORMAL LOW (ref 39.0–52.0)
Hemoglobin: 11.7 g/dL — ABNORMAL LOW (ref 13.0–17.0)
MCH: 28.3 pg (ref 26.0–34.0)
MCHC: 32.5 g/dL (ref 30.0–36.0)
MCV: 87 fL (ref 80.0–100.0)
Platelets: 296 10*3/uL (ref 150–400)
RBC: 4.14 MIL/uL — ABNORMAL LOW (ref 4.22–5.81)
RDW: 15.3 % (ref 11.5–15.5)
WBC: 14.4 10*3/uL — ABNORMAL HIGH (ref 4.0–10.5)
nRBC: 0 % (ref 0.0–0.2)

## 2020-05-07 LAB — GLUCOSE, CAPILLARY: Glucose-Capillary: 113 mg/dL — ABNORMAL HIGH (ref 70–99)

## 2020-05-07 MED ORDER — SODIUM CHLORIDE 0.9 % WEIGHT BASED INFUSION
0.5000 mL/kg/h | INTRAVENOUS | Status: DC
  Administered 2020-05-08: 0.5 mL/kg/h via INTRAVENOUS

## 2020-05-07 MED ORDER — SENNA 8.6 MG PO TABS
1.0000 | ORAL_TABLET | Freq: Every day | ORAL | Status: DC
  Filled 2020-05-07 (×2): qty 1

## 2020-05-07 MED ORDER — SODIUM CHLORIDE 0.9% FLUSH
3.0000 mL | Freq: Two times a day (BID) | INTRAVENOUS | Status: DC
  Administered 2020-05-07 (×2): 3 mL via INTRAVENOUS

## 2020-05-07 MED ORDER — POLYETHYLENE GLYCOL 3350 17 G PO PACK
17.0000 g | PACK | Freq: Two times a day (BID) | ORAL | Status: DC
  Administered 2020-05-07 (×2): 17 g via ORAL
  Filled 2020-05-07 (×3): qty 1

## 2020-05-07 MED ORDER — ASPIRIN 81 MG PO CHEW: 81.0000 mg | CHEWABLE_TABLET | Freq: Every day | ORAL | Status: DC

## 2020-05-07 MED ORDER — SODIUM CHLORIDE 0.9% FLUSH: 3.0000 mL | INTRAVENOUS | Status: DC | PRN

## 2020-05-07 MED ORDER — ASPIRIN 81 MG PO CHEW
81.0000 mg | CHEWABLE_TABLET | ORAL | Status: AC
  Administered 2020-05-08: 81 mg via ORAL
  Filled 2020-05-07: qty 1

## 2020-05-07 MED ORDER — POTASSIUM CHLORIDE CRYS ER 20 MEQ PO TBCR
20.0000 meq | EXTENDED_RELEASE_TABLET | Freq: Once | ORAL | Status: AC
  Administered 2020-05-07: 20 meq via ORAL
  Filled 2020-05-07: qty 1

## 2020-05-07 MED ORDER — SODIUM CHLORIDE 0.9 % IV SOLN: 250.0000 mL | INTRAVENOUS | Status: DC | PRN

## 2020-05-07 MED ORDER — MUPIROCIN 2 % EX OINT
TOPICAL_OINTMENT | CUTANEOUS | Status: AC
  Filled 2020-05-07: qty 22

## 2020-05-07 MED ORDER — FUROSEMIDE 10 MG/ML IJ SOLN
60.0000 mg | Freq: Three times a day (TID) | INTRAMUSCULAR | Status: DC
  Administered 2020-05-07 – 2020-05-08 (×3): 60 mg via INTRAVENOUS
  Filled 2020-05-07 (×3): qty 6

## 2020-05-07 NOTE — Progress Notes (Signed)
PROGRESS NOTE    Bobby [name].  GGY:[id number] DOB: [birth year] DOA: 05/05/2020 PCP: [name], MD   Brief Narrative: 69 year old with past medical history significant for hypertension, prostate cancer on Zytiga,  hyperlipidemia, obesity, CAD who presents complaining of worsening shortness of breath.  Poor worsening shortness of breath over the last 3 days prior to admission also report right side chest pain, sharp in quality, pleuritic. In the emergency department he was noted to be in respiratory distress, improved with oxygen supplementation.  CT angio was negative for PE, revealed bilateral pleural effusion with compressive atelectasis. EKG suggestive of A. Fib.  Cardiology reviewed initial EKG and thought patient was in sinus rhythm.  Patient admitted with CHF exacerbation, diastolic.  Assessment & Plan:   Principal Problem:   Acute on chronic diastolic CHF (congestive heart failure) (HCC) Active Problems:   Malignant neoplasm of prostate (HCC)   Mixed hyperlipidemia   Essential hypertension   Atrial fibrillation (HCC)   Hypokalemia   Prediabetes   Leukocytosis   Coronary artery disease involving native coronary artery of native heart   Atypical chest pain  1-Acute on Chronic Diastolic  Heart failure exacerbation: Present with dyspnea, B/L pleural effusion, elevated BNP.  Cardiology consulted.  Strict I and O. Daily weight.  ECHO; EF 60 %, Diastolic  Dysfunction grade 2.  Plan for cath on Monday  Weight up today 249---253. Negative only 983.  Unable to ly down flat.  Change lasix to 60 mg IV TID>   2-NSVT;  Replete mg. And potassium/   3-Bilateral Pleural effusion, PNA;  Leukocytosis.  Started ceftriaxone and azithromycin for 5 days.  Leukocytosis trending down.  Plan to repeat chest x ray tomorrow.   4-Chest pain; CAD appreciate cardiology evaluation.  On metoprolol, aspirin, statin.  Report pleuritic chest pain.   5-HTN; on metoprolol/    6-Hypokalemia;  Resolved.   Pre-Diabetes; SSI./   Malignant Neoplasm of Prostate:  Zytiga      Estimated body mass index is 40.9 kg/m as calculated from the following:   Height as of this encounter: 5\' 6"  (1.676 m).   Weight as of this encounter: 114.9 kg.   DVT prophylaxis: Lovenox Code Status: Full code Family Communication: care discussed with patient Disposition Plan:  Status is: Inpatient  Remains inpatient appropriate because:Ongoing active pain requiring inpatient pain management   Dispo: The patient is from: Home              Anticipated d/c is to: Home              Anticipated d/c date is: 3 days              Patient currently is not medically stable to d/c.        Consultants:   Cardiology   Procedures:   ECHO  Antimicrobials:    Subjective: He is still SOB, no cough.  Unable to ly down flat.   Objective: Vitals:   05/07/20 0312 05/07/20 0457 05/07/20 0600 05/07/20 0724  BP: 115/66   127/78  Pulse: 67  67 72  Resp: 20  17 15   Temp: 98 F (36.7 C)   98.7 F (37.1 C)  TempSrc: Oral   Oral  SpO2: 97%  94% 94%  Weight:  114.9 kg    Height:        Intake/Output Summary (Last 24 hours) at 05/07/2020 1033 Last data filed at 05/07/2020 0724 Gross per 24 hour  Intake 820 ml  Output 920  ml  Net -100 ml   Filed Weights   05/05/20 1300 05/06/20 0356 05/07/20 0457  Weight: 112.9 kg 113.3 kg 114.9 kg    Examination:  General exam: NAD Respiratory system: Decrease breath sound BL Cardiovascular system: S1, S 2 RRR Gastrointestinal system: BS present, soft, nt Central nervous system: Alert Extremities: No edema   Data Reviewed: I have personally reviewed following labs and imaging studies  CBC: Recent Labs  Lab 05/05/20 1243 05/06/20 0303 05/07/20 0733  WBC 14.3* 16.2* 14.4*  NEUTROABS 12.7* 13.9*  --   HGB 12.6* 12.4* 11.7*  HCT 38.0* 38.1* 36.0*  MCV 86.2 84.1 87.0  PLT 235 254 096   Basic Metabolic Panel: Recent  Labs  Lab 05/05/20 1243 05/05/20 1547 05/06/20 0303 05/07/20 0733  NA 137  --  138 133*  K 3.3*  --  3.0* 4.0  CL 102  --  98 98  CO2 25  --  26 23  GLUCOSE 159*  --  185* 137*  BUN 20  --  18 27*  CREATININE 0.88  --  1.08 1.08  CALCIUM 9.0  --  9.3 9.2  MG  --  1.9 1.9  --    GFR: Estimated Creatinine Clearance: 78 mL/min (by C-G formula based on SCr of 1.08 mg/dL). Liver Function Tests: Recent Labs  Lab 05/05/20 1243 05/06/20 0303  AST 16 13*  ALT 15 15  ALKPHOS 72 73  BILITOT 0.6 1.0  PROT 7.0 6.9  ALBUMIN 3.5 3.2*   No results for input(s): LIPASE, AMYLASE in the last 168 hours. No results for input(s): AMMONIA in the last 168 hours. Coagulation Profile: Recent Labs  Lab 05/06/20 0303  INR 1.0   Cardiac Enzymes: No results for input(s): CKTOTAL, CKMB, CKMBINDEX, TROPONINI in the last 168 hours. BNP (last 3 results) No results for input(s): PROBNP in the last 8760 hours. HbA1C: Recent Labs    05/06/20 0131  HGBA1C 5.8*   CBG: No results for input(s): GLUCAP in the last 168 hours. Lipid Profile: No results for input(s): CHOL, HDL, LDLCALC, TRIG, CHOLHDL, LDLDIRECT in the last 72 hours. Thyroid Function Tests: Recent Labs    05/06/20 0131  TSH 1.483   Anemia Panel: No results for input(s): VITAMINB12, FOLATE, FERRITIN, TIBC, IRON, RETICCTPCT in the last 72 hours. Sepsis Labs: Recent Labs  Lab 05/06/20 0131  PROCALCITON <0.10    Recent Results (from the past 240 hour(s))  SARS Coronavirus 2 by RT PCR (hospital order, performed in [facility] hospital lab) Nasopharyngeal Nasopharyngeal Swab     Status: None   Collection Time: 05/05/20  1:40 PM   Specimen: Nasopharyngeal Swab  Result Value Ref Range Status   SARS Coronavirus 2 NEGATIVE NEGATIVE Final    Comment: (NOTE) SARS-CoV-2 target nucleic acids are NOT DETECTED.  The SARS-CoV-2 RNA is generally detectable in upper and lower respiratory specimens during the acute phase of infection. The  lowest concentration of SARS-CoV-2 viral copies this assay can detect is 250 copies / mL. A negative result does not preclude SARS-CoV-2 infection and should not be used as the sole basis for treatment or other patient management decisions.  A negative result may occur with improper specimen collection / handling, submission of specimen other than nasopharyngeal swab, presence of viral mutation(s) within the areas targeted by this assay, and inadequate number of viral copies (<250 copies / mL). A negative result must be combined with clinical observations, patient history, and epidemiological information.  Fact Sheet for Patients:  StrictlyIdeas.no  Fact Sheet for Healthcare Providers: BankingDealers.co.za  This test is not yet approved or  cleared by the [place] FDA and has been authorized for detection and/or diagnosis of SARS-CoV-2 by FDA under an Emergency Use Authorization (EUA).  This EUA will remain in effect (meaning this test can be used) for the duration of the COVID-19 declaration under Section 564(b)(1) of the Act, 21 U.S.C. section 360bbb-3(b)(1), unless the authorization is terminated or revoked sooner.  Performed at [facility], [address] [place], [place] [address]   MRSA PCR Screening     Status: None   Collection Time: 05/06/20  6:49 AM   Specimen: Nasal Mucosa; Nasopharyngeal  Result Value Ref Range Status   MRSA by PCR NEGATIVE NEGATIVE Final    Comment:        The GeneXpert MRSA Assay (FDA approved for NASAL specimens only), is one component of a comprehensive MRSA colonization surveillance program. It is not intended to diagnose MRSA infection nor to guide or monitor treatment for MRSA infections. Performed at [facility], [address], [place], [place] [address]   Urine Culture     Status: None   Collection Time: 05/06/20 11:52 AM   Specimen: Urine, Random  Result  Value Ref Range Status   Specimen Description URINE, RANDOM  Final   Special Requests NONE  Final   Culture   Final    NO GROWTH Performed at [facility], [place] [address], [place], [place] [address]    Report Status 05/07/2020 FINAL  Final         Radiology Studies: CT Angio Chest PE W and/or Wo Contrast  Result Date: 05/05/2020 CLINICAL DATA:  Chest pain and shortness of breath EXAM: CT ANGIOGRAPHY CHEST WITH CONTRAST TECHNIQUE: Multidetector CT imaging of the chest was performed using the standard protocol during bolus administration of intravenous contrast. Multiplanar CT image reconstructions and MIPs were obtained to evaluate the vascular anatomy. CONTRAST:  63mL OMNIPAQUE IOHEXOL 350 MG/ML SOLN COMPARISON:  Coronary CT April 26, 2020; CT angiogram chest September 02, 2019 FINDINGS: Cardiovascular: There is no demonstrable pulmonary embolus. There is no appreciable thoracic aortic aneurysm or dissection. The visualized great vessels appear normal. There is aortic atherosclerosis. There are foci of coronary artery calcification. There is no pericardial effusion or pericardial thickening. Mediastinum/Nodes: Visualized thyroid appears normal. There are subcentimeter mediastinal lymph nodes. There is no adenopathy meeting size criteria for pathologic significance in the thoracic region. No esophageal lesions are evident. Lungs/Pleura: There are pleural effusions bilaterally with compressive atelectasis in the lung bases. There is airspace consolidation as well in the posterior left base. There is atelectatic change in inferior lingula. There is apparent fluid tracking along the left major fissure. Upper Abdomen: There are several small cystic lesions in the liver, stable. Visualized upper abdominal structures otherwise appear unremarkable. Musculoskeletal: No blastic or lytic bone lesions. No evident chest wall lesions. Review of the MIP images confirms the above findings. IMPRESSION: 1. No  demonstrable pulmonary embolus. No thoracic aortic aneurysm or dissection. There are foci of aortic atherosclerosis as well as foci of coronary artery calcification. 2. Bilateral pleural effusions with bibasilar compressive atelectasis. Suspected pneumonia in the posterior left base as well. Atelectatic change in inferior lingula. 3.  No adenopathy evident. Aortic Atherosclerosis (ICD10-I70.0). Electronically Signed   By: Lowella Grip III M.D.   On: 05/05/2020 14:52   ECHOCARDIOGRAM COMPLETE  Result Date: 05/06/2020    ECHOCARDIOGRAM REPORT  Patient Name:   Bobby [name]. Date of Exam: 05/06/2020 Medical Rec #:  [id number]        Height:       66.0 in Accession #:    [id number]       Weight:       249.8 lb Date of Birth:  March 06, 1951         BSA:          2.198 m Patient Age:    48 years         BP:           109/72 mmHg Patient Gender: M                HR:           72 bpm. Exam Location:  Inpatient Procedure: 2D Echo, Cardiac Doppler and Color Doppler Indications:     Atrial fibrillation  History:         Patient has prior history of Echocardiogram examinations, most                  recent 01/26/2020. CAD, Signs/Symptoms:Chest Pain and Shortness                  of Breath; Risk Factors:Hypertension, Diabetes, Dyslipidemia                  and Obesity. Pneumonia.  Sonographer:     Dustin Flock RDCS Referring Phys:  [id number] [place] Diagnosing Phys: Adrian Prows MD  Sonographer Comments: Patient is morbidly obese. Image acquisition challenging due to patient body habitus. IMPRESSIONS  1. Left ventricular ejection fraction, by estimation, is 60 to 65%. Left ventricular ejection fraction by PLAX is 75 %. The left ventricle has hyperdynamic function. The left ventricle has no regional wall motion abnormalities. There is moderate left ventricular hypertrophy. Left ventricular diastolic parameters are consistent with Grade II diastolic dysfunction (pseudonormalization). Elevated left ventricular  end-diastolic pressure.  2. Moderator band in the apex. Right ventricular systolic function is normal. The right ventricular size is normal. Tricuspid regurgitation signal is inadequate for assessing PA pressure.  3. Fat pad noted.  4. The mitral valve is normal in structure. Trivial mitral valve regurgitation. No evidence of mitral stenosis.  5. The aortic valve is tricuspid. Aortic valve regurgitation is not visualized. Mild to moderate aortic valve sclerosis/calcification is present, without any evidence of aortic stenosis.  6. The inferior vena cava is normal in size with greater than 50% respiratory variability, suggesting right atrial pressure of 3 mmHg. FINDINGS  Left Ventricle: Left ventricular ejection fraction, by estimation, is 60 to 65%. Left ventricular ejection fraction by PLAX is 75 %. The left ventricle has hyperdynamic function. The left ventricle has no regional wall motion abnormalities. The left ventricular internal cavity size was normal in size. There is moderate left ventricular hypertrophy. Left ventricular diastolic parameters are consistent with Grade II diastolic dysfunction (pseudonormalization). Elevated left ventricular end-diastolic pressure. Right Ventricle: Moderator band in the apex. The right ventricular size is normal. No increase in right ventricular wall thickness. Right ventricular systolic function is normal. Tricuspid regurgitation signal is inadequate for assessing PA pressure. Left Atrium: Left atrial size was normal in size. Right Atrium: Right atrial size was normal in size. Pericardium: Fat pad noted. There is no evidence of pericardial effusion. Mitral Valve: The mitral valve is normal in structure. Normal mobility of the mitral valve leaflets. Trivial mitral valve regurgitation. No evidence of mitral valve stenosis.  Tricuspid Valve: The tricuspid valve is normal in structure. Tricuspid valve regurgitation is trivial. No evidence of tricuspid stenosis. Aortic Valve: The  aortic valve is tricuspid. Aortic valve regurgitation is not visualized. Mild to moderate aortic valve sclerosis/calcification is present, without any evidence of aortic stenosis. Aortic valve peak gradient measures 13.8 mmHg. Pulmonic Valve: The pulmonic valve was grossly normal. Pulmonic valve regurgitation is not visualized. No evidence of pulmonic stenosis. Aorta: The aortic root was not well visualized. Venous: The inferior vena cava is normal in size with greater than 50% respiratory variability, suggesting right atrial pressure of 3 mmHg. IAS/Shunts: No atrial level shunt detected by color flow Doppler.  LEFT VENTRICLE PLAX 2D LV EF:         Left            Diastology                ventricular     LV e' lateral:   7.94 cm/s                ejection        LV E/e' lateral: 15.2                fraction by     LV e' medial:    7.07 cm/s                PLAX is 75      LV E/e' medial:  17.1                %. LVIDd:         4.40 cm LVIDs:         2.50 cm LV PW:         1.40 cm LV IVS:        1.60 cm LVOT diam:     2.10 cm LV SV:         112 LV SV Index:   51 LVOT Area:     3.46 cm  RIGHT VENTRICLE RV Basal diam:  4.20 cm RV S prime:     13.60 cm/s TAPSE (M-mode): 2.6 cm LEFT ATRIUM             Index       RIGHT ATRIUM           Index LA diam:        5.00 cm 2.27 cm/m  RA Area:     18.50 cm LA Vol (A2C):   75.0 ml 34.12 ml/m RA Volume:   55.70 ml  25.34 ml/m LA Vol (A4C):   56.8 ml 25.84 ml/m LA Biplane Vol: 67.5 ml 30.71 ml/m  AORTIC VALVE AV Area (Vmax): 3.04 cm AV Vmax:        186.00 cm/s AV Peak Grad:   13.8 mmHg LVOT Vmax:      163.00 cm/s LVOT Vmean:     123.000 cm/s LVOT VTI:       0.324 m  AORTA Ao Root diam: 3.10 cm MITRAL VALVE MV Area (PHT): 3.37 cm     SHUNTS MV Decel Time: 225 msec     Systemic VTI:  0.32 m MV E velocity: 121.00 cm/s  Systemic Diam: 2.10 cm MV A velocity: 119.00 cm/s MV E/A ratio:  1.02 Adrian Prows MD Electronically signed by Adrian Prows MD Signature Date/Time: 05/06/2020/1:30:55 PM     Final         Scheduled Meds: . abiraterone acetate  1,000 mg Oral Daily  .  aspirin  81 mg Oral Daily  . atorvastatin  80 mg Oral Daily  . azithromycin  500 mg Oral Daily  . darifenacin  7.5 mg Oral Daily  . enoxaparin (LOVENOX) injection  40 mg Subcutaneous Q24H  . furosemide  60 mg Intravenous Q8H  . losartan  50 mg Oral QPM  . magnesium oxide  400 mg Oral BID  . metoprolol tartrate  50 mg Oral BID  . mirabegron ER  25 mg Oral Daily  . polyethylene glycol  17 g Oral BID  . predniSONE  5 mg Oral Q breakfast  . senna  1 tablet Oral Daily  . sodium chloride flush  3 mL Intravenous Q12H  . sodium chloride flush  3 mL Intravenous Q12H  . tamsulosin  0.4 mg Oral QPC supper   Continuous Infusions: . sodium chloride    . cefTRIAXone (ROCEPHIN)  IV 1 g (05/07/20 0807)     LOS: 2 days    Time spent: 35 minutes.     Elmarie Shiley, MD [facility]   If 7PM-7AM, please contact night-coverage [email or web address]  05/07/2020, 10:33 AM

## 2020-05-07 NOTE — Plan of Care (Signed)

## 2020-05-08 ENCOUNTER — Inpatient Hospital Stay (HOSPITAL_COMMUNITY): Payer: Medicare HMO

## 2020-05-08 ENCOUNTER — Inpatient Hospital Stay (HOSPITAL_COMMUNITY)
Admission: EM | Disposition: A | Discharge status: Home / Self Care | Payer: Self-pay | Source: Home / Self Care | Attending: Internal Medicine

## 2020-05-08 ENCOUNTER — Encounter (HOSPITAL_COMMUNITY): Payer: Self-pay | Admitting: Cardiology

## 2020-05-08 HISTORY — PX: LEFT HEART CATH AND CORONARY ANGIOGRAPHY: CATH118249

## 2020-05-08 LAB — BASIC METABOLIC PANEL
Anion gap: 11 (ref 5–15)
BUN: 29 mg/dL — ABNORMAL HIGH (ref 8–23)
CO2: 25 mmol/L (ref 22–32)
Calcium: 8.9 mg/dL (ref 8.9–10.3)
Chloride: 99 mmol/L (ref 98–111)
Creatinine, Ser: 1.26 mg/dL — ABNORMAL HIGH (ref 0.61–1.24)
GFR calc Af Amer: >60 mL/min (ref >60)
GFR calc non Af Amer: 58 mL/min — ABNORMAL LOW (ref >60)
Glucose, Bld: 159 mg/dL — ABNORMAL HIGH (ref 70–99)
Potassium: 3.5 mmol/L (ref 3.5–5.1)
Sodium: 135 mmol/L (ref 135–145)

## 2020-05-08 LAB — CBC
HCT: 35.9 % — ABNORMAL LOW (ref 39.0–52.0)
Hemoglobin: 12 g/dL — ABNORMAL LOW (ref 13.0–17.0)
MCH: 27.9 pg (ref 26.0–34.0)
MCHC: 33.4 g/dL (ref 30.0–36.0)
MCV: 83.5 fL (ref 80.0–100.0)
Platelets: 324 10*3/uL (ref 150–400)
RBC: 4.3 MIL/uL (ref 4.22–5.81)
RDW: 15.1 % (ref 11.5–15.5)
WBC: 12.3 10*3/uL — ABNORMAL HIGH (ref 4.0–10.5)
nRBC: 0 % (ref 0.0–0.2)

## 2020-05-08 SURGERY — LEFT HEART CATH AND CORONARY ANGIOGRAPHY
Anesthesia: LOCAL

## 2020-05-08 MED ORDER — ATORVASTATIN CALCIUM 80 MG PO TABS
80.0000 mg | ORAL_TABLET | Freq: Every day | ORAL | 0 refills | Status: DC
Start: 2020-05-08 — End: 2020-06-12

## 2020-05-08 MED ORDER — AZITHROMYCIN 500 MG PO TABS
500.0000 mg | ORAL_TABLET | Freq: Every day | ORAL | 0 refills | Status: DC
Start: 2020-05-08 — End: 2020-05-16

## 2020-05-08 MED ORDER — SODIUM CHLORIDE 0.9% FLUSH: 3.0000 mL | Freq: Two times a day (BID) | INTRAVENOUS | Status: DC

## 2020-05-08 MED ORDER — MIDAZOLAM HCL 2 MG/2ML IJ SOLN
INTRAMUSCULAR | Status: DC | PRN
  Administered 2020-05-08: 2 mg via INTRAVENOUS

## 2020-05-08 MED ORDER — POLYETHYLENE GLYCOL 3350 17 G PO PACK: 17.0000 g | PACK | Freq: Two times a day (BID) | ORAL | 0 refills | Status: DC

## 2020-05-08 MED ORDER — LIDOCAINE HCL (PF) 1 % IJ SOLN
INTRAMUSCULAR | Status: DC | PRN
  Administered 2020-05-08: 2 mL

## 2020-05-08 MED ORDER — HEPARIN SODIUM (PORCINE) 1000 UNIT/ML IJ SOLN
INTRAMUSCULAR | Status: DC | PRN
  Administered 2020-05-08: 6000 [IU] via INTRAVENOUS

## 2020-05-08 MED ORDER — HEPARIN (PORCINE) IN NACL 1000-0.9 UT/500ML-% IV SOLN
INTRAVENOUS | Status: DC | PRN
  Administered 2020-05-08: 500 mL

## 2020-05-08 MED ORDER — SODIUM CHLORIDE 0.9 % IV SOLN: 250.0000 mL | INTRAVENOUS | Status: DC | PRN

## 2020-05-08 MED ORDER — POTASSIUM CHLORIDE ER 10 MEQ PO TBCR: 10.0000 meq | EXTENDED_RELEASE_TABLET | Freq: Every day | ORAL | 0 refills | Status: DC

## 2020-05-08 MED ORDER — VERAPAMIL HCL 2.5 MG/ML IV SOLN
INTRAVENOUS | Status: AC
  Filled 2020-05-08: qty 2

## 2020-05-08 MED ORDER — HEPARIN SODIUM (PORCINE) 1000 UNIT/ML IJ SOLN
INTRAMUSCULAR | Status: AC
  Filled 2020-05-08: qty 1

## 2020-05-08 MED ORDER — CEPHALEXIN 500 MG PO CAPS: 500.0000 mg | ORAL_CAPSULE | Freq: Two times a day (BID) | ORAL | 0 refills | Status: AC

## 2020-05-08 MED ORDER — FENTANYL CITRATE (PF) 100 MCG/2ML IJ SOLN
INTRAMUSCULAR | Status: DC | PRN
Start: 2020-05-08 — End: 2020-05-08
  Administered 2020-05-08: 25 ug via INTRAVENOUS

## 2020-05-08 MED ORDER — IOHEXOL 350 MG/ML SOLN
INTRAVENOUS | Status: DC | PRN
  Administered 2020-05-08: 50 mL

## 2020-05-08 MED ORDER — SENNA 8.6 MG PO TABS: 1.0000 | ORAL_TABLET | Freq: Every day | ORAL | 0 refills | Status: DC

## 2020-05-08 MED ORDER — LIDOCAINE HCL (PF) 1 % IJ SOLN
INTRAMUSCULAR | Status: AC
  Filled 2020-05-08: qty 30

## 2020-05-08 MED ORDER — HEPARIN (PORCINE) IN NACL 1000-0.9 UT/500ML-% IV SOLN
INTRAVENOUS | Status: AC
  Filled 2020-05-08: qty 1500

## 2020-05-08 MED ORDER — SODIUM CHLORIDE 0.9% FLUSH: 3.0000 mL | INTRAVENOUS | Status: DC | PRN

## 2020-05-08 MED ORDER — METOPROLOL TARTRATE 50 MG PO TABS
50.0000 mg | ORAL_TABLET | Freq: Two times a day (BID) | ORAL | 2 refills | Status: DC
Start: 2020-05-08 — End: 2020-05-16

## 2020-05-08 MED ORDER — VERAPAMIL HCL 2.5 MG/ML IV SOLN
INTRAVENOUS | Status: DC | PRN
  Administered 2020-05-08: 10 mL via INTRA_ARTERIAL

## 2020-05-08 MED ORDER — FENTANYL CITRATE (PF) 100 MCG/2ML IJ SOLN
INTRAMUSCULAR | Status: AC
  Filled 2020-05-08: qty 2

## 2020-05-08 MED ORDER — MIDAZOLAM HCL 2 MG/2ML IJ SOLN
INTRAMUSCULAR | Status: AC
  Filled 2020-05-08: qty 2

## 2020-05-08 SURGICAL SUPPLY — 10 items
CATH INFINITI 5FR ANG PIGTAIL (CATHETERS) ×1 IMPLANT
CATH OPTITORQUE TIG 4.0 5F (CATHETERS) ×1 IMPLANT
DEVICE RAD COMP TR BAND LRG (VASCULAR PRODUCTS) ×1 IMPLANT
GLIDESHEATH SLEND SS 6F .021 (SHEATH) ×1 IMPLANT
GUIDEWIRE INQWIRE 1.5J.035X260 (WIRE) IMPLANT
INQWIRE 1.5J .035X260CM (WIRE) ×2
KIT HEART LEFT (KITS) ×2 IMPLANT
PACK CARDIAC CATHETERIZATION (CUSTOM PROCEDURE TRAY) ×2 IMPLANT
TRANSDUCER W/STOPCOCK (MISCELLANEOUS) ×2 IMPLANT
TUBING CIL FLEX 10 FLL-RA (TUBING) ×2 IMPLANT

## 2020-05-08 NOTE — Interval H&P Note (Signed)
History and Physical Interval Note:  05/08/2020 7:52 AM  Bobby [name].  has presented today for surgery, with the diagnosis of unstable angina.  The various methods of treatment have been discussed with the patient and family. After consideration of risks, benefits and other options for treatment, the patient has consented to  Procedure(s): LEFT HEART CATH AND CORONARY ANGIOGRAPHY (N/A) and possible angioplasty as a surgical intervention.  The patient's history has been reviewed, patient examined, no change in status, stable for surgery.  I have reviewed the patient's chart and labs.  Questions were answered to the patient's satisfaction.   Cath Lab Visit (complete for each Cath Lab visit)  Clinical Evaluation Leading to the Procedure:   ACS: Yes.    Non-ACS:    Anginal Classification: CCS IV  Anti-ischemic medical therapy: Minimal Therapy (1 class of medications)  Non-Invasive Test Results: No non-invasive testing performed  Prior CABG: No previous CABG   Adrian Prows

## 2020-05-08 NOTE — Progress Notes (Signed)
Discharge instructions given to patient. Questions answered. Educated on new medication regimen, signs/symptoms, restrictions, and follow up appointments. Prescriptions given to patient and spouse. PIV DC, hemostasis achieved. Vital signs stable. All belongings sent home with patient.   Pt and spouse educated on cath site care and s/s to watch. Education verified by [employer].   Pt escorted by RN via wheelchair to private vehicle driven by spouse.

## 2020-05-08 NOTE — TOC Transition Note (Signed)
Transition of Care [facility]) - CM/SW Discharge Note   Patient Details  Name: Bobby [name]. MRN: [id number] Date of Birth: Apr 30, 1951  [facility] CM/SW Contact:  Zenon Mayo, RN Phone Number: 05/08/2020, 9:50 AM   Clinical Narrative:    Patient for dc today, per wife He does not need any [place] services. He has no needs.   Final next level of care: Home/Self Care Barriers to Discharge: No Barriers Identified   Patient Goals and CMS Choice        Discharge Placement                       Discharge Plan and Services                                     Social Determinants of Health (SDOH) Interventions     Readmission Risk Interventions No flowsheet data found.

## 2020-05-08 NOTE — Discharge Summary (Signed)
Physician Discharge Summary  Bobby [name]. CVE:[id number] DOB: Jan 29, 1951 DOA: 05/05/2020  PCP: [name], MD  Admit date: 05/05/2020 Discharge date: 05/08/2020  Admitted From: Home  Disposition:  Home.  Recommendations for Outpatient Follow-up:  1. Follow up with PCP in 1-2 weeks 2. Please obtain BMP/CBC in one week 3. Follow up with PCP for resolution of PNA.  4. Follow with cardiology for HF   Home Health: None  Discharge Condition: Stable.  CODE STATUS: Full code Diet recommendation: Heart Healthy  Brief/Interim Summary: 69 year old with past medical history significant for hypertension, prostate cancer on Zytiga,  hyperlipidemia, obesity, CAD who presents complaining of worsening shortness of breath.  Poor worsening shortness of breath over the last 3 days prior to admission also report right side chest pain, sharp in quality, pleuritic. In the emergency department he was noted to be in respiratory distress, improved with oxygen supplementation.  CT angio was negative for PE, revealed bilateral pleural effusion with compressive atelectasis. EKG suggestive of A. Fib.  Cardiology reviewed initial EKG and thought patient was in sinus rhythm.  Patient admitted with CHF exacerbation, diastolic and PNA.  1-Acute on Chronic Diastolic  Heart failure exacerbation: Present with dyspnea, B/L pleural effusion, elevated BNP.  Cardiology consulted.  Strict I and O. Daily weight.  ECHO; EF 60 %, Diastolic  Dysfunction grade 2.  Plan for cath on Monday  Weight up today 249---253--251 Negative 1L.  Chest x ray minimal B/L pleural effusion.  Underwent Cath, minimal CAD.  Plan to discharge home on oral lasix.   2-NSVT;  Replete mg. And potassium/  On metoprolol.   3-Bilateral Pleural effusion, PNA;  Leukocytosis.  Started ceftriaxone and azithromycin for 5 days.  Leukocytosis trending down.  Improved by Chest x ray.  Plan to complete 5 days of antibiotics. Discharge on keflex  and azithromycin.    4-Chest pain; CAD appreciate cardiology evaluation.  On metoprolol, aspirin, statin.  Report pleuritic chest pain. Likely related to PNA> should improved overtime.  CT angio negative for PE>   5-HTN; on metoprolol/   6-Hypokalemia;  Resolved.   Pre-Diabetes; SSI./   Malignant Neoplasm of Prostate:  Zytiga   Discharge Diagnoses:  Principal Problem:   Acute on chronic diastolic CHF (congestive heart failure) ([place]) Active Problems:   Malignant neoplasm of prostate (HCC)   Mixed hyperlipidemia   Essential hypertension   Atrial fibrillation (HCC)   Hypokalemia   Prediabetes   Leukocytosis   Coronary artery disease involving native coronary artery of native heart   Atypical chest pain    Discharge Instructions  Discharge Instructions    Diet - low sodium heart healthy   Complete by: As directed    Increase activity slowly   Complete by: As directed      Allergies as of 05/08/2020   No Known Allergies     Medication List    STOP taking these medications   diltiazem 240 MG 24 hr capsule Commonly known as: Cardizem CD   hydrochlorothiazide 25 MG tablet Commonly known as: HYDRODIURIL   meloxicam 7.5 MG tablet Commonly known as: MOBIC     TAKE these medications   abiraterone acetate 250 MG tablet Commonly known as: ZYTIGA TAKE 4 TABLETS (1,000 MG TOTAL) BY MOUTH DAILY. TAKE ON AN EMPTY STOMACH, 1 HOUR BEFORE OR 2 HOURS AFTER A MEAL.   aspirin EC 81 MG tablet Take 1 tablet (81 mg total) by mouth daily.   atorvastatin 80 MG tablet Commonly known as: LIPITOR Take 1  tablet (80 mg total) by mouth daily. What changed:   medication strength  how much to take  when to take this   azithromycin 500 MG tablet Commonly known as: ZITHROMAX Take 1 tablet (500 mg total) by mouth daily.   cephALEXin 500 MG capsule Commonly known as: KEFLEX Take 1 capsule (500 mg total) by mouth 2 (two) times daily for 3 days.   losartan 50 MG  tablet Commonly known as: COZAAR Take 1 tablet (50 mg total) by mouth every evening.   methocarbamol 500 MG tablet Commonly known as: ROBAXIN Take 1 tablet by mouth daily as needed for muscle spasms.   metoprolol tartrate 50 MG tablet Commonly known as: LOPRESSOR Take 1 tablet (50 mg total) by mouth 2 (two) times daily.   multivitamin with minerals tablet Take 1 tablet by mouth daily.   Myrbetriq 25 MG Tb24 tablet Generic drug: mirabegron ER Take 25 mg by mouth daily.   polyethylene glycol 17 g packet Commonly known as: MIRALAX / GLYCOLAX Take 17 g by mouth 2 (two) times daily.   potassium chloride 10 MEQ tablet Commonly known as: KLOR-CON Take 1 tablet (10 mEq total) by mouth daily for 10 days.   predniSONE 5 MG tablet Commonly known as: DELTASONE TAKE 1 TABLET BY MOUTH DAILY WITH BREAKFAST   senna 8.6 MG Tabs tablet Commonly known as: SENOKOT Take 1 tablet (8.6 mg total) by mouth daily.   solifenacin 5 MG tablet Commonly known as: VESICARE Take 5 mg by mouth daily.   tamsulosin 0.4 MG Caps capsule Commonly known as: FLOMAX Take 1 capsule (0.4 mg total) by mouth daily after supper. What changed: when to take this   traMADol 50 MG tablet Commonly known as: ULTRAM Take 50 mg by mouth 2 (two) times daily as needed for moderate pain.       Follow-up Information    Rex Kras, DO Follow up on 05/16/2020.   Specialties: Cardiology, Radiology, Vascular Surgery Why: 3:45 pm. ASrrive 15 min early and bring all medications Contact information: [place] [id number] [phone]        [name], MD Follow up in 1 week(s).   Specialty: Endocrinology Contact information: [address] [place] [address] [phone]              No Known Allergies  Consultations: Cardiology   Procedures/Studies: DG Chest 2 View  Result Date: 05/08/2020 CLINICAL DATA:  Pleural effusion EXAM: CHEST - 2 VIEW COMPARISON:  Prior  chest x-ray 09/02/2019; CT scan of the chest 05/05/2020 FINDINGS: Small bilateral layering pleural effusions with associated bibasilar atelectasis. Trace fluid tracks along the minor fissure. No new airspace infiltrate. Cardiac and mediastinal contours are within normal limits. Mild atherosclerotic calcification noted in the transverse aorta. IMPRESSION: Small bilateral layering pleural effusions with associated bibasilar atelectasis. Aortic atherosclerosis. Electronically Signed   By: Jacqulynn Cadet M.D.   On: 05/08/2020 07:53   CT Angio Chest PE W and/or Wo Contrast  Result Date: 05/05/2020 CLINICAL DATA:  Chest pain and shortness of breath EXAM: CT ANGIOGRAPHY CHEST WITH CONTRAST TECHNIQUE: Multidetector CT imaging of the chest was performed using the standard protocol during bolus administration of intravenous contrast. Multiplanar CT image reconstructions and MIPs were obtained to evaluate the vascular anatomy. CONTRAST:  5mL OMNIPAQUE IOHEXOL 350 MG/ML SOLN COMPARISON:  Coronary CT April 26, 2020; CT angiogram chest September 02, 2019 FINDINGS: Cardiovascular: There is no demonstrable pulmonary embolus. There is no appreciable thoracic aortic  aneurysm or dissection. The visualized great vessels appear normal. There is aortic atherosclerosis. There are foci of coronary artery calcification. There is no pericardial effusion or pericardial thickening. Mediastinum/Nodes: Visualized thyroid appears normal. There are subcentimeter mediastinal lymph nodes. There is no adenopathy meeting size criteria for pathologic significance in the thoracic region. No esophageal lesions are evident. Lungs/Pleura: There are pleural effusions bilaterally with compressive atelectasis in the lung bases. There is airspace consolidation as well in the posterior left base. There is atelectatic change in inferior lingula. There is apparent fluid tracking along the left major fissure. Upper Abdomen: There are several small cystic  lesions in the liver, stable. Visualized upper abdominal structures otherwise appear unremarkable. Musculoskeletal: No blastic or lytic bone lesions. No evident chest wall lesions. Review of the MIP images confirms the above findings. IMPRESSION: 1. No demonstrable pulmonary embolus. No thoracic aortic aneurysm or dissection. There are foci of aortic atherosclerosis as well as foci of coronary artery calcification. 2. Bilateral pleural effusions with bibasilar compressive atelectasis. Suspected pneumonia in the posterior left base as well. Atelectatic change in inferior lingula. 3.  No adenopathy evident. Aortic Atherosclerosis (ICD10-I70.0). Electronically Signed   By: Lowella Grip III M.D.   On: 05/05/2020 14:52   CARDIAC CATHETERIZATION  Result Date: 05/08/2020 Left Heart Catheterization 05/08/20: Hyperdynamic LVEF, EF 65 to 70%.  Mildly elevated LV EDP. Mild diffuse coronary artery disease with mild coronary calcification involving LAD, circumflex and dominant RCA.  LAD is very small and gives origin to a very large LAD: D1 which reaches the apex. Recommendation: Patient symptoms are related to acute diastolic heart failure with elevated LVEDP.  Weight loss, exercise would certainly help.  He will be discharged home today with outpatient follow-up.  50 mL contrast utilized.  CT CORONARY MORPH W/CTA COR W/SCORE W/CA W/CM &/OR WO/CM  Addendum Date: 04/29/2020   ADDENDUM REPORT: 04/29/2020 13:58 HISTORY: 69 y.o. Caucasian male whose past medical history and cardiac risk factors include: Coronary calcification on nongated CT, prediabetes, hyperlipidemia, hypertension, advanced age, former smoker, obesity due to excess calories has dyspnea on exertion and abnormal nuclear stress test. EXAM: Cardiac/Coronary  CT TECHNIQUE: The patient was scanned on a [employer]. PROTOCOL: A 120 kV prospective scan was triggered in the descending thoracic aorta at 111 HU's. Axial non-contrast 3 mm slices were  carried out through the heart. The data set was analyzed on a dedicated work station and scored using the [place]. Gantry rotation speed was 250 msecs and collimation was .6 mm. No IV beta blockade and 0.8 mg of sl NTG was given. The 3D data set was reconstructed in 5% intervals of the 67-82 % of the R-R cycle. Diastolic phases were analyzed on a dedicated work station using MPR, MIP and VRT modes. The patient received 11mL OMNIPAQUE IOHEXOL 350 MG/ML SOLN of contrast. FINDINGS: Coronary calcium score is 1101, which places the patient in the 89th percentile for age and sex matched control. Coronary arteries: Normal coronary origins.  Right dominance. Left Main Coronary Artery: The left main is a large caliber vessel with a normal take off from the left coronary cusp that bifurcates to form a left anterior descending artery and a left circumflex artery. Minimal calcified plaque at the ostial segment of the left main (<25%) stenosis. Left Anterior Descending Coronary Artery: The LAD gives off 1 large septal branch and 3 patent diagonal branches. Mild to moderate stenosis within the proximal segment of the LAD due to predominantly eccentric calcified plaque. Moderate  to severe stenosis in the proximal/mid segment (distal to the first diagonal branch) of the LAD due to mixed plaque. Mild to moderate stenosis within the mid to distal LAD due to noncalcified plaque. Mild stenosis (25-49%) within the ostial segment of the first septal branch due to mixed plaque and remainder of the vessel is patent. Severe stenosis (70-99%) at the ostial segment of the first diagonal branch due to calcified plaque. Severity of the stenosis may be overestimated due to blooming artifact. Diffuse mixed plaque within the proximal to mid first diagonal branch. Second and 3rd diagonal branches are overall patent. Left Circumflex Artery: The LCX gives off 1 patent obtuse marginal branches. Severe stenosis (70-99%) within the proximal  segment due to eccentric calcified plaque. Overall severity of stenosis may be overestimated due to blooming artifact. Mild stenosis within the mid to distal segments due to mixed plaque but overall vessel is patent. Right Coronary Artery: The RCA is dominant with normal take off from the right coronary cusp. The RCA terminates as a PDA and right posterolateral branch. Mild stenosis (25-49%) within the proximal segment due to mixed plaque. Mild stenosis (25-49%) within the mid segment due to noncalcified plaque. Moderate stenosis (50-69%) within the distal segment mostly due to calcified plaque. Mild stenosis (25-49%) within the proximal 1st acute marginal branch due to calcified plaque. Mild stenosis (25-49%) at the ostium of the posterior descending artery due to mixed calcified plaque. Left Atrium: There is no left atrial appendage filling defect. Left Ventricle: There is no abnormal filling defect. Pulmonary arteries: Normal in size without proximal filling defect. Pulmonary veins: Normal pulmonary venous drainage. Aorta: Normal size, 37 mm at the mid ascending aorta (level of the PA bifurcation) measured double oblique. No calcifications. No dissection. Pericardium: Normal thickness with no significant effusion or calcium present. Cardiac valves: The aortic valve is trileaflet without significant calcification. The mitral valve is normal structure with mitral annular calcification. Extra-cardiac findings: See attached radiology report for non-cardiac structures. IMPRESSION: 1. Coronary calcium score of 1101. This was 89th percentile for age and sex matched control. 2. Normal coronary origin with right dominance. 3.  Minimal calcified plaque within the left main artery. Moderate to severe stenosis within the proximal/mid LAD segment due to calcified plaque. Severe stenosis at the ostial segment of the 1st diagonal branch due calcified plaque. Severe stenosis within the proximal segment due to eccentric calcified  plaque within the LCX. Moderate stenosis within the distal RCA due to calcified plaque. Of note, severity of the stenosis may be overestimated due to blooming artifact caused by calcified plaque. 3. CADRADS = 4. Cardiac catheterization or CT FFR is recommended. Consider symptom-guided anti-ischemic pharmacotherapy as well as risk factor modification per guideline directed care. Invasive coronary angiography recommended with revascularization per published guideline statements. 4. Study is sent for CT-FFR findings will be performed and reported separately. Electronically Signed   By: Rex Kras   On: 04/29/2020 13:58   Result Date: 04/29/2020 EXAM: OVER-READ INTERPRETATION  CT CHEST The following report is an over-read performed by radiologist Dr. Abigail Miyamoto of [facility], [place] on 04/26/2020. This over-read does not include interpretation of cardiac or coronary anatomy or pathology. The coronary CTA interpretation by the cardiologist is attached. COMPARISON:  09/02/2019 CTA chest FINDINGS: Vascular: Aortic atherosclerosis. No central pulmonary embolism, on this non-dedicated study. Mediastinum/Nodes: No imaged thoracic adenopathy. Lungs/Pleura: No pleural fluid.  Clear imaged lungs. Upper Abdomen: High left hepatic lobe cysts. Musculoskeletal: No acute osseous abnormality. IMPRESSION: 1. No  acute findings in the imaged extracardiac chest. 2. Aortic Atherosclerosis (ICD10-I70.0). Electronically Signed: By: Abigail Miyamoto M.D. On: 04/26/2020 11:03   CT CORONARY FRACTIONAL FLOW RESERVE DATA PREP  Result Date: 04/29/2020 EXAM: CT FFR ANALYSIS CLINICAL DATA:  69 y.o. Caucasian male whose past medical history and cardiac risk factors include: Coronary calcification, prediabetes, hyperlipidemia, hypertension, advanced age, former smoker, obesity due to excess calories and has dyspnea on exertion and abnormal nuclear stress test. FINDINGS: FFRct analysis was performed on the original cardiac CT angiogram  dataset. Diagrammatic representation of the FFRct analysis is provided in a separate PDF document in PACS. This dictation was created using the PDF document and an interactive 3D model of the results. 3D model is not available in the EMR/PACS. Normal FFR range is >0.80. Indeterminate (grey) zone is 0.76-0.80. 1. Left Main: FFR = 0.99 2. LAD: Proximal FFR = 0.98, Mid FFR = 0.87, Distal FFR = 0.82. 3. LCX: Proximal FFR = 0.95, Distal FFR = 0.82 4. RCA: Proximal FFR = 0.97, Mid FFR =0.94, Distal FFR = 0.92 IMPRESSION: 1.  CT FFR analysis showed no significant stenosis. RECOMMENDATIONS: Goal directed medical therapy and aggressive risk factor modification for secondary prevention of coronary artery disease. Electronically Signed   By: Rex Kras   On: 04/29/2020 15:59   ECHOCARDIOGRAM COMPLETE  Result Date: 05/06/2020    ECHOCARDIOGRAM REPORT   Patient Name:   Bobby [name]. Date of Exam: 05/06/2020 Medical Rec #:  [id number]        Height:       66.0 in Accession #:    [id number]       Weight:       249.8 lb Date of Birth:  1951/08/29         BSA:          2.198 m Patient Age:    69 years         BP:           109/72 mmHg Patient Gender: M                HR:           72 bpm. Exam Location:  Inpatient Procedure: 2D Echo, Cardiac Doppler and Color Doppler Indications:     Atrial fibrillation  History:         Patient has prior history of Echocardiogram examinations, most                  recent 01/26/2020. CAD, Signs/Symptoms:Chest Pain and Shortness                  of Breath; Risk Factors:Hypertension, Diabetes, Dyslipidemia                  and Obesity. Pneumonia.  Sonographer:     Dustin Flock RDCS Referring Phys:  [id number] [place] Diagnosing Phys: Adrian Prows MD  Sonographer Comments: Patient is morbidly obese. Image acquisition challenging due to patient body habitus. IMPRESSIONS  1. Left ventricular ejection fraction, by estimation, is 60 to 65%. Left ventricular ejection fraction by PLAX is 75 %.  The left ventricle has hyperdynamic function. The left ventricle has no regional wall motion abnormalities. There is moderate left ventricular hypertrophy. Left ventricular diastolic parameters are consistent with Grade II diastolic dysfunction (pseudonormalization). Elevated left ventricular end-diastolic pressure.  2. Moderator band in the apex. Right ventricular systolic function is normal. The right ventricular size is normal. Tricuspid regurgitation signal is inadequate  for assessing PA pressure.  3. Fat pad noted.  4. The mitral valve is normal in structure. Trivial mitral valve regurgitation. No evidence of mitral stenosis.  5. The aortic valve is tricuspid. Aortic valve regurgitation is not visualized. Mild to moderate aortic valve sclerosis/calcification is present, without any evidence of aortic stenosis.  6. The inferior vena cava is normal in size with greater than 50% respiratory variability, suggesting right atrial pressure of 3 mmHg. FINDINGS  Left Ventricle: Left ventricular ejection fraction, by estimation, is 60 to 65%. Left ventricular ejection fraction by PLAX is 75 %. The left ventricle has hyperdynamic function. The left ventricle has no regional wall motion abnormalities. The left ventricular internal cavity size was normal in size. There is moderate left ventricular hypertrophy. Left ventricular diastolic parameters are consistent with Grade II diastolic dysfunction (pseudonormalization). Elevated left ventricular end-diastolic pressure. Right Ventricle: Moderator band in the apex. The right ventricular size is normal. No increase in right ventricular wall thickness. Right ventricular systolic function is normal. Tricuspid regurgitation signal is inadequate for assessing PA pressure. Left Atrium: Left atrial size was normal in size. Right Atrium: Right atrial size was normal in size. Pericardium: Fat pad noted. There is no evidence of pericardial effusion. Mitral Valve: The mitral valve is  normal in structure. Normal mobility of the mitral valve leaflets. Trivial mitral valve regurgitation. No evidence of mitral valve stenosis. Tricuspid Valve: The tricuspid valve is normal in structure. Tricuspid valve regurgitation is trivial. No evidence of tricuspid stenosis. Aortic Valve: The aortic valve is tricuspid. Aortic valve regurgitation is not visualized. Mild to moderate aortic valve sclerosis/calcification is present, without any evidence of aortic stenosis. Aortic valve peak gradient measures 13.8 mmHg. Pulmonic Valve: The pulmonic valve was grossly normal. Pulmonic valve regurgitation is not visualized. No evidence of pulmonic stenosis. Aorta: The aortic root was not well visualized. Venous: The inferior vena cava is normal in size with greater than 50% respiratory variability, suggesting right atrial pressure of 3 mmHg. IAS/Shunts: No atrial level shunt detected by color flow Doppler.  LEFT VENTRICLE PLAX 2D LV EF:         Left            Diastology                ventricular     LV e' lateral:   7.94 cm/s                ejection        LV E/e' lateral: 15.2                fraction by     LV e' medial:    7.07 cm/s                PLAX is 75      LV E/e' medial:  17.1                %. LVIDd:         4.40 cm LVIDs:         2.50 cm LV PW:         1.40 cm LV IVS:        1.60 cm LVOT diam:     2.10 cm LV SV:         112 LV SV Index:   51 LVOT Area:     3.46 cm  RIGHT VENTRICLE RV Basal diam:  4.20 cm RV S prime:  13.60 cm/s TAPSE (M-mode): 2.6 cm LEFT ATRIUM             Index       RIGHT ATRIUM           Index LA diam:        5.00 cm 2.27 cm/m  RA Area:     18.50 cm LA Vol (A2C):   75.0 ml 34.12 ml/m RA Volume:   55.70 ml  25.34 ml/m LA Vol (A4C):   56.8 ml 25.84 ml/m LA Biplane Vol: 67.5 ml 30.71 ml/m  AORTIC VALVE AV Area (Vmax): 3.04 cm AV Vmax:        186.00 cm/s AV Peak Grad:   13.8 mmHg LVOT Vmax:      163.00 cm/s LVOT Vmean:     123.000 cm/s LVOT VTI:       0.324 m  AORTA Ao Root diam:  3.10 cm MITRAL VALVE MV Area (PHT): 3.37 cm     SHUNTS MV Decel Time: 225 msec     Systemic VTI:  0.32 m MV E velocity: 121.00 cm/s  Systemic Diam: 2.10 cm MV A velocity: 119.00 cm/s MV E/A ratio:  1.02 Adrian Prows MD Electronically signed by Adrian Prows MD Signature Date/Time: 05/06/2020/1:30:55 PM    Final     Subjective: Alert. breathing better.  He is still experiencing pleuritic chest pain.   Discharge Exam: Vitals:   05/08/20 0837 05/08/20 0954  BP: 118/77 126/66  Pulse:  73  Resp: 18 19  Temp:    SpO2:  94%     General: Pt is alert, awake, not in acute distress Cardiovascular: RRR, S1/S2 +, no rubs, no gallops Respiratory: CTA bilaterally, no wheezing, no rhonchi Abdominal: Soft, NT, ND, bowel sounds + Extremities: no edema, no cyanosis    The results of significant diagnostics from this hospitalization (including imaging, microbiology, ancillary and laboratory) are listed below for reference.     Microbiology: Recent Results (from the past 240 hour(s))  SARS Coronavirus 2 by RT PCR (hospital order, performed in [facility] hospital lab) Nasopharyngeal Nasopharyngeal Swab     Status: None   Collection Time: 05/05/20  1:40 PM   Specimen: Nasopharyngeal Swab  Result Value Ref Range Status   SARS Coronavirus 2 NEGATIVE NEGATIVE Final    Comment: (NOTE) SARS-CoV-2 target nucleic acids are NOT DETECTED.  The SARS-CoV-2 RNA is generally detectable in upper and lower respiratory specimens during the acute phase of infection. The lowest concentration of SARS-CoV-2 viral copies this assay can detect is 250 copies / mL. A negative result does not preclude SARS-CoV-2 infection and should not be used as the sole basis for treatment or other patient management decisions.  A negative result may occur with improper specimen collection / handling, submission of specimen other than nasopharyngeal swab, presence of viral mutation(s) within the areas targeted by this assay, and  inadequate number of viral copies (<250 copies / mL). A negative result must be combined with clinical observations, patient history, and epidemiological information.  Fact Sheet for Patients:   StrictlyIdeas.no  Fact Sheet for Healthcare Providers: BankingDealers.co.za  This test is not yet approved or  cleared by the [place] FDA and has been authorized for detection and/or diagnosis of SARS-CoV-2 by FDA under an Emergency Use Authorization (EUA).  This EUA will remain in effect (meaning this test can be used) for the duration of the COVID-19 declaration under Section 564(b)(1) of the Act, 21 U.S.C. section 360bbb-3(b)(1), unless the authorization is  terminated or revoked sooner.  Performed at [facility], [address] [place], [place] [address]   Culture, blood (routine x 2)     Status: None (Preliminary result)   Collection Time: 05/06/20  1:40 AM   Specimen: BLOOD  Result Value Ref Range Status   Specimen Description BLOOD RIGHT HAND  Final   Special Requests   Final    BOTTLES DRAWN AEROBIC AND ANAEROBIC Blood Culture adequate volume   Culture   Final    NO GROWTH 2 DAYS Performed at [facility], [address], [place], [place] [address]    Report Status PENDING  Incomplete  Culture, blood (routine x 2)     Status: None (Preliminary result)   Collection Time: 05/06/20  1:46 AM   Specimen: BLOOD  Result Value Ref Range Status   Specimen Description BLOOD LEFT ARM  Final   Special Requests   Final    BOTTLES DRAWN AEROBIC ONLY Blood Culture adequate volume   Culture   Final    NO GROWTH 2 DAYS Performed at [facility], [place] [address], [place], [place] [address]    Report Status PENDING  Incomplete  MRSA PCR Screening     Status: None   Collection Time: 05/06/20  6:49 AM   Specimen: Nasal Mucosa; Nasopharyngeal  Result Value Ref Range Status   MRSA by PCR NEGATIVE NEGATIVE Final     Comment:        The GeneXpert MRSA Assay (FDA approved for NASAL specimens only), is one component of a comprehensive MRSA colonization surveillance program. It is not intended to diagnose MRSA infection nor to guide or monitor treatment for MRSA infections. Performed at [facility], [place] [address], [place], [place] [address]   Urine Culture     Status: None   Collection Time: 05/06/20 11:52 AM   Specimen: Urine, Random  Result Value Ref Range Status   Specimen Description URINE, RANDOM  Final   Special Requests NONE  Final   Culture   Final    NO GROWTH Performed at [facility], [place] [address] [place], [place] [address]    Report Status 05/07/2020 FINAL  Final     Labs: BNP (last 3 results) Recent Labs    09/02/19 1642 05/05/20 1243  BNP 93.8 867.6*   Basic Metabolic Panel: Recent Labs  Lab 05/05/20 1243 05/05/20 1547 05/06/20 0303 05/07/20 0733 05/08/20 0118  NA 137  --  138 133* 135  K 3.3*  --  3.0* 4.0 3.5  CL 102  --  98 98 99  CO2 25  --  26 23 25   GLUCOSE 159*  --  185* 137* 159*  BUN 20  --  18 27* 29*  CREATININE 0.88  --  1.08 1.08 1.26*  CALCIUM 9.0  --  9.3 9.2 8.9  MG  --  1.9 1.9  --   --    Liver Function Tests: Recent Labs  Lab 05/05/20 1243 05/06/20 0303  AST 16 13*  ALT 15 15  ALKPHOS 72 73  BILITOT 0.6 1.0  PROT 7.0 6.9  ALBUMIN 3.5 3.2*   No results for input(s): LIPASE, AMYLASE in the last 168 hours. No results for input(s): AMMONIA in the last 168 hours. CBC: Recent Labs  Lab 05/05/20 1243 05/06/20 0303 05/07/20 0733 05/08/20 0118  WBC 14.3* 16.2* 14.4* 12.3*  NEUTROABS 12.7* 13.9*  --   --   HGB  12.6* 12.4* 11.7* 12.0*  HCT 38.0* 38.1* 36.0* 35.9*  MCV 86.2 84.1 87.0 83.5  PLT 235 254 296 324   Cardiac Enzymes: No results for input(s): CKTOTAL, CKMB, CKMBINDEX, TROPONINI in the last 168 hours. BNP: Invalid input(s): POCBNP CBG: Recent Labs  Lab 05/07/20 1610  GLUCAP 113*   D-Dimer No  results for input(s): DDIMER in the last 72 hours. Hgb A1c Recent Labs    05/06/20 0131  HGBA1C 5.8*   Lipid Profile No results for input(s): CHOL, HDL, LDLCALC, TRIG, CHOLHDL, LDLDIRECT in the last 72 hours. Thyroid function studies Recent Labs    05/06/20 0131  TSH 1.483   Anemia work up No results for input(s): VITAMINB12, FOLATE, FERRITIN, TIBC, IRON, RETICCTPCT in the last 72 hours. Urinalysis    Component Value Date/Time   COLORURINE YELLOW 05/06/2020 1116   APPEARANCEUR CLEAR 05/06/2020 1116   LABSPEC 1.010 05/06/2020 1116   PHURINE 5.0 05/06/2020 1116   GLUCOSEU NEGATIVE 05/06/2020 1116   HGBUR SMALL (A) 05/06/2020 1116   BILIRUBINUR NEGATIVE 05/06/2020 1116   [place] 05/06/2020 1116   PROTEINUR NEGATIVE 05/06/2020 1116   NITRITE NEGATIVE 05/06/2020 1116   LEUKOCYTESUR NEGATIVE 05/06/2020 1116   Sepsis Labs Invalid input(s): PROCALCITONIN,  WBC,  LACTICIDVEN Microbiology Recent Results (from the past 240 hour(s))  SARS Coronavirus 2 by RT PCR (hospital order, performed in [place] hospital lab) Nasopharyngeal Nasopharyngeal Swab     Status: None   Collection Time: 05/05/20  1:40 PM   Specimen: Nasopharyngeal Swab  Result Value Ref Range Status   SARS Coronavirus 2 NEGATIVE NEGATIVE Final    Comment: (NOTE) SARS-CoV-2 target nucleic acids are NOT DETECTED.  The SARS-CoV-2 RNA is generally detectable in upper and lower respiratory specimens during the acute phase of infection. The lowest concentration of SARS-CoV-2 viral copies this assay can detect is 250 copies / mL. A negative result does not preclude SARS-CoV-2 infection and should not be used as the sole basis for treatment or other patient management decisions.  A negative result may occur with improper specimen collection / handling, submission of specimen other than nasopharyngeal swab, presence of viral mutation(s) within the areas targeted by this assay, and inadequate number of viral  copies (<250 copies / mL). A negative result must be combined with clinical observations, patient history, and epidemiological information.  Fact Sheet for Patients:   StrictlyIdeas.no  Fact Sheet for Healthcare Providers: BankingDealers.co.za  This test is not yet approved or  cleared by the [place] FDA and has been authorized for detection and/or diagnosis of SARS-CoV-2 by FDA under an Emergency Use Authorization (EUA).  This EUA will remain in effect (meaning this test can be used) for the duration of the COVID-19 declaration under Section 564(b)(1) of the Act, 21 U.S.C. section 360bbb-3(b)(1), unless the authorization is terminated or revoked sooner.  Performed at [facility], [address] [place], [place] [address]   Culture, blood (routine x 2)     Status: None (Preliminary result)   Collection Time: 05/06/20  1:40 AM   Specimen: BLOOD  Result Value Ref Range Status   Specimen Description BLOOD RIGHT HAND  Final   Special Requests   Final    BOTTLES DRAWN AEROBIC AND ANAEROBIC Blood Culture adequate volume   Culture   Final    NO GROWTH 2 DAYS Performed at [facility], [address] [place], [place] [address]    Report Status PENDING  Incomplete  Culture, blood (  routine x 2)     Status: None (Preliminary result)   Collection Time: 05/06/20  1:46 AM   Specimen: BLOOD  Result Value Ref Range Status   Specimen Description BLOOD LEFT ARM  Final   Special Requests   Final    BOTTLES DRAWN AEROBIC ONLY Blood Culture adequate volume   Culture   Final    NO GROWTH 2 DAYS Performed at [facility], [address], [place], [place] [address]    Report Status PENDING  Incomplete  MRSA PCR Screening     Status: None   Collection Time: 05/06/20  6:49 AM   Specimen: Nasal Mucosa; Nasopharyngeal  Result Value Ref Range Status   MRSA by PCR NEGATIVE NEGATIVE Final    Comment:        The  GeneXpert MRSA Assay (FDA approved for NASAL specimens only), is one component of a comprehensive MRSA colonization surveillance program. It is not intended to diagnose MRSA infection nor to guide or monitor treatment for MRSA infections. Performed at [facility], [place] [address], [place], [place] [address]   Urine Culture     Status: None   Collection Time: 05/06/20 11:52 AM   Specimen: Urine, Random  Result Value Ref Range Status   Specimen Description URINE, RANDOM  Final   Special Requests NONE  Final   Culture   Final    NO GROWTH Performed at [facility], [place] [address], [place], [place] [address]    Report Status 05/07/2020 FINAL  Final     Time coordinating discharge: 40 minutes  SIGNED:   Elmarie Shiley, MD  [facility]

## 2020-05-09 DIAGNOSIS — I10 Essential (primary) hypertension: Secondary | ICD-10-CM | POA: Diagnosis not present

## 2020-05-09 MED FILL — Heparin Sod (Porcine)-NaCl IV Soln 1000 Unit/500ML-0.9%: INTRAVENOUS | Qty: 500 | Status: AC

## 2020-05-11 ENCOUNTER — Emergency Department (HOSPITAL_COMMUNITY)
Admission: EM | Admit: 2020-05-11 | Discharge: 2020-05-11 | Discharge status: Against Medical Advice | Payer: Medicare HMO | Attending: Emergency Medicine | Admitting: Emergency Medicine

## 2020-05-11 ENCOUNTER — Other Ambulatory Visit: Payer: Self-pay

## 2020-05-11 ENCOUNTER — Emergency Department (HOSPITAL_COMMUNITY): Payer: Medicare HMO

## 2020-05-11 ENCOUNTER — Other Ambulatory Visit: Payer: Self-pay | Admitting: Cardiology

## 2020-05-11 ENCOUNTER — Other Ambulatory Visit: Payer: Self-pay | Admitting: Oncology

## 2020-05-11 ENCOUNTER — Encounter (HOSPITAL_COMMUNITY): Payer: Self-pay

## 2020-05-11 DIAGNOSIS — I5031 Acute diastolic (congestive) heart failure: Secondary | ICD-10-CM

## 2020-05-11 DIAGNOSIS — J9811 Atelectasis: Secondary | ICD-10-CM | POA: Diagnosis not present

## 2020-05-11 DIAGNOSIS — M545 Low back pain, unspecified: Secondary | ICD-10-CM | POA: Insufficient documentation

## 2020-05-11 DIAGNOSIS — R0602 Shortness of breath: Secondary | ICD-10-CM | POA: Insufficient documentation

## 2020-05-11 DIAGNOSIS — J90 Pleural effusion, not elsewhere classified: Secondary | ICD-10-CM | POA: Diagnosis not present

## 2020-05-11 DIAGNOSIS — Z5321 Procedure and treatment not carried out due to patient leaving prior to being seen by health care provider: Secondary | ICD-10-CM | POA: Diagnosis not present

## 2020-05-11 DIAGNOSIS — C61 Malignant neoplasm of prostate: Secondary | ICD-10-CM

## 2020-05-11 DIAGNOSIS — R0609 Other forms of dyspnea: Secondary | ICD-10-CM

## 2020-05-11 LAB — BASIC METABOLIC PANEL
Anion gap: 8 (ref 5–15)
BUN: 15 mg/dL (ref 8–23)
CO2: 26 mmol/L (ref 22–32)
Calcium: 9.1 mg/dL (ref 8.9–10.3)
Chloride: 104 mmol/L (ref 98–111)
Creatinine, Ser: 1.15 mg/dL (ref 0.61–1.24)
GFR calc Af Amer: >60 mL/min (ref >60)
GFR calc non Af Amer: >60 mL/min (ref >60)
Glucose, Bld: 103 mg/dL — ABNORMAL HIGH (ref 70–99)
Potassium: 4.7 mmol/L (ref 3.5–5.1)
Sodium: 138 mmol/L (ref 135–145)

## 2020-05-11 LAB — CULTURE, BLOOD (ROUTINE X 2)
Culture: NO GROWTH
Culture: NO GROWTH
Special Requests: ADEQUATE
Special Requests: ADEQUATE

## 2020-05-11 LAB — CBC
HCT: 36.8 % — ABNORMAL LOW (ref 39.0–52.0)
Hemoglobin: 11.6 g/dL — ABNORMAL LOW (ref 13.0–17.0)
MCH: 27.6 pg (ref 26.0–34.0)
MCHC: 31.5 g/dL (ref 30.0–36.0)
MCV: 87.4 fL (ref 80.0–100.0)
Platelets: 365 10*3/uL (ref 150–400)
RBC: 4.21 MIL/uL — ABNORMAL LOW (ref 4.22–5.81)
RDW: 14.6 % (ref 11.5–15.5)
WBC: 13 10*3/uL — ABNORMAL HIGH (ref 4.0–10.5)
nRBC: 0 % (ref 0.0–0.2)

## 2020-05-11 MED ORDER — TORSEMIDE 5 MG PO TABS: 5.0000 mg | ORAL_TABLET | Freq: Every morning | ORAL | 0 refills | Status: DC

## 2020-05-11 NOTE — ED Notes (Signed)
Patient attempted to stand and almost fell. Obtained vitals and had the triage RN talk to patient. Patient states he has been here long enough and even though he was short of breath he was leaving.

## 2020-05-11 NOTE — ED Triage Notes (Signed)
Pt presents with SOB, recently dc'd from here for the same. Pt states "its not getting better"  Pt reports he had multiple test while here but they were unable to "reach a resolution"

## 2020-05-11 NOTE — Telephone Encounter (Signed)
Pt wife called and said that her husband is having SOB while laying down, also he is not sleeping well due this SOB. He has no energy and stays in bed a lot. Wife said that pt is also very confused after waking up from sleep.  Pt's Oxygen was 96 while on the phone with me. Pt's BP is normal also

## 2020-05-11 NOTE — Telephone Encounter (Signed)
Spoke to the patient's wife.  Labs today  Started Torsemide for now.  Advised to go to ER if symptoms worsen as he has pleural effusion and PNA

## 2020-05-16 ENCOUNTER — Other Ambulatory Visit: Payer: Self-pay

## 2020-05-16 ENCOUNTER — Encounter: Payer: Self-pay | Admitting: Cardiology

## 2020-05-16 ENCOUNTER — Ambulatory Visit: Payer: Medicare HMO | Admitting: Cardiology

## 2020-05-16 VITALS — BP 129/67 | HR 103 | Ht 66.0 in | Wt 253.0 lb

## 2020-05-16 DIAGNOSIS — I5031 Acute diastolic (congestive) heart failure: Secondary | ICD-10-CM

## 2020-05-16 DIAGNOSIS — I2510 Atherosclerotic heart disease of native coronary artery without angina pectoris: Secondary | ICD-10-CM | POA: Diagnosis not present

## 2020-05-16 DIAGNOSIS — Z87891 Personal history of nicotine dependence: Secondary | ICD-10-CM

## 2020-05-16 DIAGNOSIS — Z712 Person consulting for explanation of examination or test findings: Secondary | ICD-10-CM | POA: Diagnosis not present

## 2020-05-16 DIAGNOSIS — E782 Mixed hyperlipidemia: Secondary | ICD-10-CM | POA: Diagnosis not present

## 2020-05-16 DIAGNOSIS — R0609 Other forms of dyspnea: Secondary | ICD-10-CM

## 2020-05-16 DIAGNOSIS — Z09 Encounter for follow-up examination after completed treatment for conditions other than malignant neoplasm: Secondary | ICD-10-CM

## 2020-05-16 DIAGNOSIS — I10 Essential (primary) hypertension: Secondary | ICD-10-CM

## 2020-05-16 DIAGNOSIS — R0600 Dyspnea, unspecified: Secondary | ICD-10-CM | POA: Diagnosis not present

## 2020-05-16 DIAGNOSIS — I2584 Coronary atherosclerosis due to calcified coronary lesion: Secondary | ICD-10-CM | POA: Diagnosis not present

## 2020-05-16 MED ORDER — TORSEMIDE 5 MG PO TABS: 5.0000 mg | ORAL_TABLET | Freq: Every morning | ORAL | 0 refills | Status: DC

## 2020-05-16 MED ORDER — DILTIAZEM HCL ER COATED BEADS 240 MG PO CP24: 240.0000 mg | ORAL_CAPSULE | Freq: Every morning | ORAL | 0 refills | Status: DC

## 2020-05-16 NOTE — Progress Notes (Signed)
Date:  05/16/2020   ID:  Bobby Forster., DOB [birth year], MRN [id number]  PCP:  [name], MD  Cardiologist: Rex Kras, DO, [facility] (established care 01/21/2020)  Date: 05/16/20 Last Office Visit: 05/01/2020  Chief Complaint  Patient presents with  . Congestive Heart Failure  . Coronary Artery Disease  . Follow-up    Bobby Blancett. is a 69 y.o. male who presents to the office with a  chief complaint of "shortness of breath and heart failure management."  His past medical history and cardiovascular risk factors are: Nonobstructive coronary artery disease, severe coronary artery calcification 1101 AU, acute on chronic HFpEF/stage C/NYHA class III, prediabetes, hyperlipidemia, hypertension, advanced age, former smoker, obesity due to excess calories.  Patient is accompanied today by his wife [name].   Patient was originally referred to the office for evaluation of  coronary artery calcification.   Given the coronary artery calcification and dyspnea on exertion patient underwent an ischemic evaluation including a coronary CTA results of which were discussed during prior office visits and findings noted below.  Patient was asked to increase physical activity as tolerated and to seek medical attention sooner if he develops any new symptoms.  Since last office visit patient has been hospitalized for acute on chronic heart failure exacerbation due to hospitalized.  During the hospitalization patient also underwent left heart catheterization and was found to have nonobstructive CAD.  And despite his recent hospitalization he continues to have effort related dyspnea.  Dyspnea is usually brought on by effort related activities.  He denies any chest pain.  Mild orthopnea without paroxysmal nocturnal dyspnea and bilateral lower extremity swelling.    Patient's wife had reached out to the office on May 11, 2020 and she was recommended to start torsemide and get baseline blood work.   However when she went to go get the blood work done patient was recommended to go to the hospital due to his shortness of breath.  Patient did go to the ER but not admitted.    No family history of premature coronary disease or sudden cardiac death.  Denies prior history of myocardial infarction, deep venous thrombosis, pulmonary embolism, stroke, transient ischemic attack.  FUNCTIONAL STATUS: Limited due to back pain.    ALLERGIES: No Known Allergies  MEDICATION LIST PRIOR TO VISIT: Current Meds  Medication Sig  . abiraterone acetate (ZYTIGA) 250 MG tablet TAKE 4 TABLETS (1,000 MG TOTAL) BY MOUTH DAILY. TAKE ON AN EMPTY STOMACH, 1 HOUR BEFORE OR 2 HOURS AFTER A MEAL.  Marland Kitchen aspirin EC 81 MG tablet Take 1 tablet (81 mg total) by mouth daily.  Marland Kitchen atorvastatin (LIPITOR) 80 MG tablet Take 1 tablet (80 mg total) by mouth daily.  Marland Kitchen losartan (COZAAR) 50 MG tablet Take 1 tablet (50 mg total) by mouth every evening.  . methocarbamol (ROBAXIN) 500 MG tablet Take 1 tablet by mouth daily as needed for muscle spasms.   . Multiple Vitamins-Minerals (MULTIVITAMIN WITH MINERALS) tablet Take 1 tablet by mouth daily.  Marland Kitchen MYRBETRIQ 25 MG TB24 tablet Take 25 mg by mouth daily.  . polyethylene glycol (MIRALAX / GLYCOLAX) 17 g packet Take 17 g by mouth 2 (two) times daily.  . potassium chloride (KLOR-CON) 10 MEQ tablet Take 1 tablet (10 mEq total) by mouth daily for 10 days.  . predniSONE (DELTASONE) 5 MG tablet TAKE 1 TABLET BY MOUTH DAILY WITH BREAKFAST (Patient taking differently: Take 5 mg by mouth daily with breakfast. )  . solifenacin (VESICARE)  5 MG tablet Take 5 mg by mouth daily.  . tamsulosin (FLOMAX) 0.4 MG CAPS capsule Take 1 capsule (0.4 mg total) by mouth daily after supper. (Patient taking differently: Take 0.4 mg by mouth at bedtime. )  . traMADol (ULTRAM) 50 MG tablet Take 50 mg by mouth 2 (two) times daily as needed for moderate pain.   . [DISCONTINUED] metoprolol tartrate (LOPRESSOR) 50 MG  tablet Take 1 tablet (50 mg total) by mouth 2 (two) times daily.     PAST MEDICAL HISTORY: Past Medical History:  Diagnosis Date  . Bronchitis   . CHF (congestive heart failure) ([place]) 04/2020   A/C HFpEF  . Coronary artery calcification of native artery   . Family history of pancreatic cancer   . History of kidney stones   . Hyperlipidemia   . Hypertension   . Pneumonia   . Prostate cancer ([place])     PAST SURGICAL HISTORY: Past Surgical History:  Procedure Laterality Date  . EXTRACORPOREAL SHOCK WAVE LITHOTRIPSY Right 04/17/2017   Procedure: RIGHT EXTRACORPOREAL SHOCK WAVE LITHOTRIPSY (ESWL);  Surgeon: Kathie Rhodes, MD;  Location: [facility];  Service: Urology;  Laterality: Right;  . HERNIA REPAIR     age 7  . LEFT HEART CATH AND CORONARY ANGIOGRAPHY N/A 05/08/2020   Procedure: LEFT HEART CATH AND CORONARY ANGIOGRAPHY;  Surgeon: Adrian Prows, MD;  Location: [place] [facility];  Service: Cardiovascular;  Laterality: N/A;  . PROSTATE BIOPSY      FAMILY HISTORY: The patient family history includes Breast cancer in his maternal aunt; Breast cancer (age of onset: 56) in his sister; Kidney cancer in his maternal aunt; Pancreatic cancer (age of onset: 48) in his brother.  SOCIAL HISTORY:  The patient  reports that he quit smoking about 8 years ago. His smoking use included cigarettes. He has a 2.50 pack-year smoking history. He has never used smokeless tobacco. He reports that he does not drink alcohol and does not use drugs.  REVIEW OF SYSTEMS: Review of Systems  Constitutional: Negative for chills and fever.  HENT: Negative for hoarse voice and nosebleeds.   Eyes: Negative for discharge, double vision and pain.  Cardiovascular: Positive for dyspnea on exertion, leg swelling and orthopnea. Negative for chest pain, claudication, near-syncope, palpitations, paroxysmal nocturnal dyspnea and syncope.  Respiratory: Positive for shortness of breath. Negative for hemoptysis.   Musculoskeletal:  Negative for muscle cramps and myalgias.  Gastrointestinal: Negative for abdominal pain, constipation, diarrhea, hematemesis, hematochezia, melena, nausea and vomiting.  Neurological: Negative for dizziness and light-headedness.   PHYSICAL EXAM: Vitals with BMI 05/16/2020 05/11/2020 05/11/2020  Height 5\' 6"  - -  Weight 253 lbs - -  BMI 76.73 - -  Systolic 419 379 024  Diastolic 67 86 76  Pulse 097 73 66    CONSTITUTIONAL: Well-developed and well-nourished. No acute distress.  SKIN: Skin is warm and dry. No rash noted. No cyanosis. No pallor. No jaundice HEAD: Normocephalic and atraumatic.  EYES: No scleral icterus MOUTH/THROAT: Moist oral membranes.  NECK: No JVD present. No thyromegaly noted. No carotid bruits  LYMPHATIC: No visible cervical adenopathy.  CHEST Normal respiratory effort. No intercostal retractions  LUNGS: Decreased breath sounds at the bases. No stridor. No wheezes. No rales.  CARDIOVASCULAR: Regular rate and rhythm, positive S1-S2, no murmurs rubs or gallops appreciated. ABDOMINAL: Obese, soft, nontender, nondistended, positive bowel sounds all 4 quadrants. No apparent ascites.  EXTREMITIES: +1 bilateral pitting peripheral edema  HEMATOLOGIC: No significant bruising NEUROLOGIC: Oriented to person, place, and  time. Nonfocal. Normal muscle tone.  PSYCHIATRIC: Normal mood and affect. Normal behavior. Cooperative  RADIOLOGY: CT angio chest PE with and without contrast September 02, 2019:Cardiovascular: There is mild cardiomegaly. No pericardial effusion. There is multi vessel coronary vascular calcification. There is mild atherosclerotic calcification of the thoracic aorta.  CARDIAC DATABASE: EKG: 01/21/2020: Normal sinus rhythm, 92 bpm, normal axis, ST depressions in the high lateral and lateral leads, no evidence of myocardial injury pattern.   Echocardiogram: 01/26/2020: Hyperdynamic LV systolic function with visual EF >70%. Intraventricular PG of 31 mm Hg detected.   Left ventricle cavity is normal in size. Moderate concentric hypertrophy of the left ventricle. Normal global wall motion. Doppler evidence of grade I (impaired) diastolic dysfunction, elevated LAP. Mild (Grade I) aortic regurgitation. Mild (Grade I) mitral regurgitation.   05/06/2020: LVEF 60-65%, hyperdynamic LVEF, moderate LVH, grade 2 diastolic impairment, elevated LVEDP, RV systolic function and size are within normal limits, trivial TR, trivial MR, mild to moderate aortic valve sclerosis without stenosis.  Stress Testing: Lexiscan/modified Bruce Tetrofosmin stress test 02/23/2020: Stress EKG showed sinus tachycardia, inferolateral T wave inversion.  SPECT images show small sized, medium intensity, mid to basal inferolateral perfusion defect with mild reversibility. Stress LVEF 82%. Low risk study.  Coronary CTA 04/26/2020: 1. Coronary calcium score of 1101. This was 89th percentile for age and sex matched control. 2. Normal coronary origin with right dominance. 3.  Minimal calcified plaque within the left main artery. Moderate to severe stenosis within the proximal/mid LAD segment to calcified plaque. Severe stenosis at the ostial segment of the 1st diagonal branch due calcified plaque. Severe stenosis within the proximal segment due to eccentric calcified plaque within the LCX. Moderate stenosis within the distal RCA due to calcified plaque. Of note, severity of the stenosis may be overestimated due to blooming artifact caused by calcified plaque. 4. CADRADS = 4. Cardiac catheterization or CT FFR is recommended. Consider symptom-guided anti-ischemic pharmacotherapy as well as risk factor modification per guideline directed care. Invasive coronary angiography recommended with revascularization per published guideline statements. 4. Study is sent for CT-FFR findings will be performed and reported   CT-FFR 04/27/2020: CT FFR analysis showed no significant stenosis.  Heart  Catheterization: Left Heart Catheterization 05/08/20:  Hyperdynamic LVEF, EF 65 to 70%.  Mildly elevated LV EDP. Mild diffuse coronary artery disease with mild coronary calcification involving LAD, circumflex and dominant RCA.  LAD is very small and gives origin to a very large LAD: D1 which reaches the apex. Recommendation: Patient symptoms are related to acute diastolic heart failure with elevated LVEDP.  LABORATORY DATA: CBC Latest Ref Rng & Units 05/11/2020 05/08/2020 05/07/2020  WBC 4.0 - 10.5 K/uL 13.0(H) 12.3(H) 14.4(H)  Hemoglobin 13.0 - 17.0 g/dL 11.6(L) 12.0(L) 11.7(L)  Hematocrit 39 - 52 % 36.8(L) 35.9(L) 36.0(L)  Platelets 150 - 400 K/uL 365 324 296    CMP Latest Ref Rng & Units 05/11/2020 05/08/2020 05/07/2020  Glucose 70 - 99 mg/dL 103(H) 159(H) 137(H)  BUN 8 - 23 mg/dL 15 29(H) 27(H)  Creatinine 0.61 - 1.24 mg/dL 1.15 1.26(H) 1.08  Sodium 135 - 145 mmol/L 138 135 133(L)  Potassium 3.5 - 5.1 mmol/L 4.7 3.5 4.0  Chloride 98 - 111 mmol/L 104 99 98  CO2 22 - 32 mmol/L 26 25 23   Calcium 8.9 - 10.3 mg/dL 9.1 8.9 9.2  Total Protein 6.5 - 8.1 g/dL - - -  Total Bilirubin 0.3 - 1.2 mg/dL - - -  Alkaline Phos 38 - 126 U/L - - -  AST 15 - 41 U/L - - -  ALT 0 - 44 U/L - - -   Hepatic Function Panel Recent Labs    02/16/20 1054 05/05/20 1243 05/06/20 0303  PROT 6.7 7.0 6.9  ALBUMIN 3.5 3.5 3.2*  AST 17 16 13*  ALT 21 15 15   ALKPHOS 94 72 73  BILITOT 0.6 0.6 1.0   External Labs: Lipid Panel 12/30/2019: total 155, Triglycerides 167, HDL 44, LDL 78  12/30/2019: A1C 5.4% Glucose Random 115 BUN 14, Creatinine, Serum 0.9  03/19/2019: PSA normal   06/05/2018: TSH 1.000  IMPRESSION:    ICD-10-CM   1. Acute heart failure with preserved ejection fraction (HFpEF) (HCC)  I50.31 EKG 12-Lead    torsemide (DEMADEX) 5 MG tablet    diltiazem (DILTIAZEM CD) 240 MG 24 hr capsule    DG Chest 2 View    Basic metabolic panel    Magnesium    Pro b natriuretic peptide (BNP)  2. DOE  (dyspnea on exertion)  R06.00 DG Chest 2 View  3. Nonobstructive atherosclerosis of coronary artery  I25.10   4. Coronary artery calcification  I25.10    I25.84   5. Mixed hyperlipidemia  E78.2   6. Former smoker  Z87.891   71. Benign hypertension  I10   8. Class 3 severe obesity due to excess calories with serious comorbidity and body mass index (BMI) of 40.0 to 44.9 in adult (HCC)  E66.01    Z68.41   9. Encounter to discuss test results  Z71.2   10. Hospital discharge follow-up  Z09      RECOMMENDATIONS: Bobby Alomar. is a 69 y.o. male whose past medical history and cardiac risk factors include: Nonobstructive coronary artery disease, severe coronary artery calcification 1101 AU, acute on chronic HFpEF/stage C/NYHA class III, prediabetes, hyperlipidemia, hypertension, advanced age, former smoker, obesity due to excess calories.  Acute on chronic heart failure preserved EF, stage C, NYHA class III:  Since last visit patient has gained approximately 4 pounds, has lower extremity swelling, and orthopnea.  Patient has not started the diuretic therapy that was recommended on 05/11/2020.  Check chest x-ray  Start torsemide 5 mg p.o. every morning  Change metoprolol to Cardizem 240 mg CD p.o. daily  Continue ARB.  Check blood work in 1 week to evaluate kidney function electrolytes.  Coronary atherosclerosis due to calcified coronary lesions in the native artery without angina pectoris:  Continue aspirin.  Continue statin therapy. LDL is less than 70mg  /dL.   Educated on importance of improving modifiable cardiovascular risk factors for the prevention/progression of CAD  Benign essential hypertension:  Patient's blood pressure at today's office visit is at goal.  But he is enrolled into ambulatory blood pressure monitoring and BP log reviewed.   Continue hydrochlorothiazide and losartan.  Low salt diet recommended.   Mixed hyperlipidemia: Continue lipitor given  prediabetes and coronary artery disease.  Independently reviewed the lipid profile with the patient at today's office visit.  LDL currently at goal.  Obesity, due to excess calories: Body mass index is 40.84 kg/m. . I reviewed with the patient the importance of diet, regular physical activity/exercise, weight loss.   . Patient is educated on increasing physical activity gradually as tolerated.  With the goal of moderate intensity exercise for 30 minutes a day 5 days a week.  Former smoker: Educated on the importance of continued smoking cessation.  FINAL MEDICATION LIST END OF ENCOUNTER: Meds ordered this encounter  Medications  .  torsemide (DEMADEX) 5 MG tablet    Sig: Take 1 tablet (5 mg total) by mouth in the morning.    Dispense:  30 tablet    Refill:  0  . diltiazem (DILTIAZEM CD) 240 MG 24 hr capsule    Sig: Take 1 capsule (240 mg total) by mouth in the morning.    Dispense:  90 capsule    Refill:  0    Current Outpatient Medications:  .  abiraterone acetate (ZYTIGA) 250 MG tablet, TAKE 4 TABLETS (1,000 MG TOTAL) BY MOUTH DAILY. TAKE ON AN EMPTY STOMACH, 1 HOUR BEFORE OR 2 HOURS AFTER A MEAL., Disp: 120 tablet, Rfl: 11 .  aspirin EC 81 MG tablet, Take 1 tablet (81 mg total) by mouth daily., Disp: 90 tablet, Rfl: 3 .  atorvastatin (LIPITOR) 80 MG tablet, Take 1 tablet (80 mg total) by mouth daily., Disp: 30 tablet, Rfl: 0 .  losartan (COZAAR) 50 MG tablet, Take 1 tablet (50 mg total) by mouth every evening., Disp: 90 tablet, Rfl: 0 .  methocarbamol (ROBAXIN) 500 MG tablet, Take 1 tablet by mouth daily as needed for muscle spasms. , Disp: , Rfl:  .  Multiple Vitamins-Minerals (MULTIVITAMIN WITH MINERALS) tablet, Take 1 tablet by mouth daily., Disp: , Rfl:  .  MYRBETRIQ 25 MG TB24 tablet, Take 25 mg by mouth daily., Disp: , Rfl:  .  polyethylene glycol (MIRALAX / GLYCOLAX) 17 g packet, Take 17 g by mouth 2 (two) times daily., Disp: 14 each, Rfl: 0 .  potassium chloride (KLOR-CON)  10 MEQ tablet, Take 1 tablet (10 mEq total) by mouth daily for 10 days., Disp: 10 tablet, Rfl: 0 .  predniSONE (DELTASONE) 5 MG tablet, TAKE 1 TABLET BY MOUTH DAILY WITH BREAKFAST (Patient taking differently: Take 5 mg by mouth daily with breakfast. ), Disp: 30 tablet, Rfl: 2 .  solifenacin (VESICARE) 5 MG tablet, Take 5 mg by mouth daily., Disp: , Rfl:  .  tamsulosin (FLOMAX) 0.4 MG CAPS capsule, Take 1 capsule (0.4 mg total) by mouth daily after supper. (Patient taking differently: Take 0.4 mg by mouth at bedtime. ), Disp: 30 capsule, Rfl: 0 .  traMADol (ULTRAM) 50 MG tablet, Take 50 mg by mouth 2 (two) times daily as needed for moderate pain. , Disp: , Rfl:  .  diltiazem (DILTIAZEM CD) 240 MG 24 hr capsule, Take 1 capsule (240 mg total) by mouth in the morning., Disp: 90 capsule, Rfl: 0 .  senna (SENOKOT) 8.6 MG TABS tablet, Take 1 tablet (8.6 mg total) by mouth daily., Disp: 120 tablet, Rfl: 0 .  torsemide (DEMADEX) 5 MG tablet, Take 1 tablet (5 mg total) by mouth in the morning., Disp: 30 tablet, Rfl: 0  Orders Placed This Encounter  Procedures  . DG Chest 2 View  . Basic metabolic panel  . Magnesium  . Pro b natriuretic peptide (BNP)  . EKG 12-Lead   --Continue cardiac medications as reconciled in final medication list. --Return in about 1 week (around 05/23/2020) for Reevaluation of HFpEF. Or sooner if needed. --Continue follow-up with your primary care physician regarding the management of your other chronic comorbid conditions.  Total time spent: 45 minutes.  Patient's questions and concerns were addressed to his satisfaction. He voices understanding of the instructions provided during this encounter.   This note was created using a voice recognition software as a result there may be grammatical errors inadvertently enclosed that do not reflect the nature of this encounter. Every attempt is  made to correct such errors.  Total time spent: 37 minutes.   Rex Kras, [place],  [facility]  Pager: [phone] Office: [phone]

## 2020-05-17 ENCOUNTER — Other Ambulatory Visit: Payer: Self-pay | Admitting: Cardiology

## 2020-05-17 DIAGNOSIS — I10 Essential (primary) hypertension: Secondary | ICD-10-CM

## 2020-05-17 MED FILL — ABIRATERONE ACETATE 250 MG: 250 | 30 days supply | Qty: 120 | Fill #0

## 2020-05-18 ENCOUNTER — Ambulatory Visit
Admission: RE | Admit: 2020-05-18 | Discharge: 2020-05-18 | Disposition: A | Discharge status: Home / Self Care | Payer: Medicare HMO | Source: Ambulatory Visit | Attending: Cardiology | Admitting: Cardiology

## 2020-05-18 DIAGNOSIS — R0602 Shortness of breath: Secondary | ICD-10-CM | POA: Diagnosis not present

## 2020-05-19 DIAGNOSIS — N1831 Chronic kidney disease, stage 3a: Secondary | ICD-10-CM | POA: Diagnosis not present

## 2020-05-19 DIAGNOSIS — I5033 Acute on chronic diastolic (congestive) heart failure: Secondary | ICD-10-CM | POA: Diagnosis not present

## 2020-05-19 DIAGNOSIS — I130 Hypertensive heart and chronic kidney disease with heart failure and stage 1 through stage 4 chronic kidney disease, or unspecified chronic kidney disease: Secondary | ICD-10-CM | POA: Diagnosis not present

## 2020-05-19 DIAGNOSIS — R0602 Shortness of breath: Secondary | ICD-10-CM | POA: Diagnosis not present

## 2020-05-23 DIAGNOSIS — I5031 Acute diastolic (congestive) heart failure: Secondary | ICD-10-CM | POA: Diagnosis not present

## 2020-05-24 LAB — PRO B NATRIURETIC PEPTIDE: NT-Pro BNP: 678 pg/mL — ABNORMAL HIGH (ref 0–376)

## 2020-05-24 LAB — MAGNESIUM: Magnesium: 1.9 mg/dL (ref 1.6–2.3)

## 2020-05-24 LAB — BASIC METABOLIC PANEL
BUN/Creatinine Ratio: 19 (ref 10–24)
BUN: 16 mg/dL (ref 8–27)
CO2: 24 mmol/L (ref 20–29)
Calcium: 9.1 mg/dL (ref 8.6–10.2)
Chloride: 103 mmol/L (ref 96–106)
Creatinine, Ser: 0.85 mg/dL (ref 0.76–1.27)
GFR calc Af Amer: 103 mL/min/{1.73_m2} (ref >59)
GFR calc non Af Amer: 90 mL/min/{1.73_m2} (ref >59)
Glucose: 144 mg/dL — ABNORMAL HIGH (ref 65–99)
Potassium: 3.7 mmol/L (ref 3.5–5.2)
Sodium: 141 mmol/L (ref 134–144)

## 2020-05-25 ENCOUNTER — Ambulatory Visit: Payer: Medicare HMO | Admitting: Cardiology

## 2020-05-25 ENCOUNTER — Encounter: Payer: Self-pay | Admitting: Cardiology

## 2020-05-25 ENCOUNTER — Other Ambulatory Visit: Payer: Self-pay

## 2020-05-25 VITALS — BP 144/71 | HR 85 | Resp 16 | Ht 66.0 in | Wt 260.0 lb

## 2020-05-25 DIAGNOSIS — Z87891 Personal history of nicotine dependence: Secondary | ICD-10-CM

## 2020-05-25 DIAGNOSIS — Z712 Person consulting for explanation of examination or test findings: Secondary | ICD-10-CM

## 2020-05-25 DIAGNOSIS — I2510 Atherosclerotic heart disease of native coronary artery without angina pectoris: Secondary | ICD-10-CM | POA: Diagnosis not present

## 2020-05-25 DIAGNOSIS — R0609 Other forms of dyspnea: Secondary | ICD-10-CM

## 2020-05-25 DIAGNOSIS — I5031 Acute diastolic (congestive) heart failure: Secondary | ICD-10-CM

## 2020-05-25 DIAGNOSIS — Z6841 Body Mass Index (BMI) 40.0 and over, adult: Secondary | ICD-10-CM | POA: Diagnosis not present

## 2020-05-25 DIAGNOSIS — I2584 Coronary atherosclerosis due to calcified coronary lesion: Secondary | ICD-10-CM | POA: Diagnosis not present

## 2020-05-25 DIAGNOSIS — E782 Mixed hyperlipidemia: Secondary | ICD-10-CM | POA: Diagnosis not present

## 2020-05-25 DIAGNOSIS — I10 Essential (primary) hypertension: Secondary | ICD-10-CM | POA: Diagnosis not present

## 2020-05-25 MED ORDER — METOLAZONE 2.5 MG PO TABS: 2.5000 mg | ORAL_TABLET | ORAL | 0 refills | Status: DC

## 2020-05-25 MED ORDER — TORSEMIDE 10 MG PO TABS: 10.0000 mg | ORAL_TABLET | Freq: Every morning | ORAL | 0 refills | Status: DC

## 2020-05-25 NOTE — Progress Notes (Signed)
Date:  06/01/2020   ID:  Bobby [name]., DOB Sep 13, 1950, MRN [id number]  PCP:  [name], MD  Cardiologist: Rex Kras, DO, [facility] (established care 01/21/2020)  Date: 05/25/2020 Last Office Visit: 05/16/2020  Chief Complaint  Patient presents with  . HFpEF    Re-evaluation  . Follow-up    1 week    Bobby [name]. is a 69 y.o. male who presents to the office with a  chief complaint of "shortness of breath and heart failure management."  His past medical history and cardiovascular risk factors are: Nonobstructive coronary artery disease, severe coronary artery calcification 1101 AU, acute on chronic HFpEF/stage C/NYHA class III, prediabetes, hyperlipidemia, hypertension, advanced age, former smoker, obesity due to excess calories.  Patient is accompanied today by his wife [name].   Patient was originally referred to the office for evaluation of  coronary artery calcification.   Given the coronary artery calcification and dyspnea on exertion patient underwent an ischemic evaluation including a coronary CTA results and thereafter underwent left heart catheterization during one of his subsequent hospitalizations results noted below.    Patient recently diagnosed with heart failure with preserved EF and his medications have been titrated.  Despite change in diuretic therapy patient continues to have effort related dyspnea, PND, lower extremity swelling.    Patient's wife is also concerned that he is more tired, fatigued, drowsy, and falls asleep easily.  Clinically there is high clinical suspicion for sleep breathing disorder such as sleep apnea but I discussed with the patient at today's visit.  No family history of premature coronary disease or sudden cardiac death.  Denies prior history of myocardial infarction, deep venous thrombosis, pulmonary embolism, stroke, transient ischemic attack.  FUNCTIONAL STATUS: Limited due to back pain.    ALLERGIES: No Known  Allergies  MEDICATION LIST PRIOR TO VISIT: Current Meds  Medication Sig  . abiraterone acetate (ZYTIGA) 250 MG tablet TAKE 4 TABLETS (1,000 MG TOTAL) BY MOUTH DAILY. TAKE ON AN EMPTY STOMACH, 1 HOUR BEFORE OR 2 HOURS AFTER A MEAL.  Marland Kitchen aspirin EC 81 MG tablet Take 1 tablet (81 mg total) by mouth daily.  Marland Kitchen atorvastatin (LIPITOR) 80 MG tablet Take 1 tablet (80 mg total) by mouth daily.  Marland Kitchen diltiazem (DILTIAZEM CD) 240 MG 24 hr capsule Take 1 capsule (240 mg total) by mouth in the morning.  Marland Kitchen losartan (COZAAR) 50 MG tablet TAKE ONE TABLET by mouth EVERY EVENING  . methocarbamol (ROBAXIN) 500 MG tablet Take 1 tablet by mouth daily as needed for muscle spasms.   . Multiple Vitamins-Minerals (MULTIVITAMIN WITH MINERALS) tablet Take 1 tablet by mouth daily.  . predniSONE (DELTASONE) 5 MG tablet TAKE 1 TABLET BY MOUTH DAILY WITH BREAKFAST (Patient taking differently: Take 5 mg by mouth daily with breakfast. )  . solifenacin (VESICARE) 5 MG tablet Take 5 mg by mouth daily.  . tamsulosin (FLOMAX) 0.4 MG CAPS capsule Take 1 capsule (0.4 mg total) by mouth daily after supper. (Patient taking differently: Take 0.4 mg by mouth at bedtime. )  . traMADol (ULTRAM) 50 MG tablet Take 50 mg by mouth 2 (two) times daily as needed for moderate pain.   . [DISCONTINUED] torsemide (DEMADEX) 5 MG tablet Take 1 tablet (5 mg total) by mouth in the morning.     PAST MEDICAL HISTORY: Past Medical History:  Diagnosis Date  . Bronchitis   . CHF (congestive heart failure) ([place]) 04/2020   A/C HFpEF  . Coronary artery calcification of  native artery   . Family history of pancreatic cancer   . History of kidney stones   . Hyperlipidemia   . Hypertension   . Pneumonia   . Prostate cancer ([place])     PAST SURGICAL HISTORY: Past Surgical History:  Procedure Laterality Date  . EXTRACORPOREAL SHOCK WAVE LITHOTRIPSY Right 04/17/2017   Procedure: RIGHT EXTRACORPOREAL SHOCK WAVE LITHOTRIPSY (ESWL);  Surgeon: Kathie Rhodes, MD;   Location: [facility];  Service: Urology;  Laterality: Right;  . HERNIA REPAIR     age 39  . LEFT HEART CATH AND CORONARY ANGIOGRAPHY N/A 05/08/2020   Procedure: LEFT HEART CATH AND CORONARY ANGIOGRAPHY;  Surgeon: Adrian Prows, MD;  Location: [facility];  Service: Cardiovascular;  Laterality: N/A;  . PROSTATE BIOPSY      FAMILY HISTORY: The patient family history includes Breast cancer in his maternal aunt; Breast cancer (age of onset: 24) in his sister; Kidney cancer in his maternal aunt; Pancreatic cancer (age of onset: 35) in his brother.  SOCIAL HISTORY:  The patient  reports that he quit smoking about 8 years ago. His smoking use included cigarettes. He has a 2.50 pack-year smoking history. He has never used smokeless tobacco. He reports that he does not drink alcohol and does not use drugs.  REVIEW OF SYSTEMS: Review of Systems  Constitutional: Negative for chills and fever.  HENT: Negative for hoarse voice and nosebleeds.   Eyes: Negative for discharge, double vision and pain.  Cardiovascular: Positive for dyspnea on exertion, leg swelling, orthopnea and paroxysmal nocturnal dyspnea. Negative for chest pain, claudication, near-syncope, palpitations and syncope.  Respiratory: Positive for shortness of breath. Negative for hemoptysis.   Musculoskeletal: Negative for muscle cramps and myalgias.  Gastrointestinal: Negative for abdominal pain, constipation, diarrhea, hematemesis, hematochezia, melena, nausea and vomiting.  Neurological: Negative for dizziness and light-headedness.   PHYSICAL EXAM: Vitals with BMI 05/30/2020 05/25/2020 05/16/2020  Height 5\' 7"  5\' 6"  5\' 6"   Weight 246 lbs 10 oz 260 lbs 253 lbs  BMI 38.61 56.21 30.86  Systolic 578 469 629  Diastolic 64 71 67  Pulse 82 85 103    CONSTITUTIONAL: Well-developed and well-nourished. No acute distress.  SKIN: Skin is warm and dry. No rash noted. No cyanosis. No pallor. No jaundice HEAD: Normocephalic and atraumatic.  EYES:  No scleral icterus MOUTH/THROAT: Moist oral membranes.  NECK: No JVD present. No thyromegaly noted. No carotid bruits  LYMPHATIC: No visible cervical adenopathy.  CHEST Normal respiratory effort. No intercostal retractions  LUNGS: Decreased breath sounds at the bases. No stridor. No wheezes. No rales.  CARDIOVASCULAR: Regular rate and rhythm, positive S1-S2, no murmurs rubs or gallops appreciated. ABDOMINAL: Obese, soft, nontender, nondistended, positive bowel sounds all 4 quadrants. No apparent ascites.  EXTREMITIES: +1 bilateral pitting peripheral edema  HEMATOLOGIC: No significant bruising NEUROLOGIC: Oriented to person, place, and time. Nonfocal. Normal muscle tone.  PSYCHIATRIC: Normal mood and affect. Normal behavior. Cooperative  RADIOLOGY: CT angio chest PE with and without contrast September 02, 2019:Cardiovascular: There is mild cardiomegaly. No pericardial effusion. There is multi vessel coronary vascular calcification. There is mild atherosclerotic calcification of the thoracic aorta.  CARDIAC DATABASE: EKG: 01/21/2020: Normal sinus rhythm, 92 bpm, normal axis, ST depressions in the high lateral and lateral leads, no evidence of myocardial injury pattern.   Echocardiogram: 01/26/2020: Hyperdynamic LV systolic function with visual EF >70%. Intraventricular PG of 31 mm Hg detected.  Left ventricle cavity is normal in size. Moderate concentric hypertrophy of the left ventricle.  Normal global wall motion. Doppler evidence of grade I (impaired) diastolic dysfunction, elevated LAP. Mild (Grade I) aortic regurgitation. Mild (Grade I) mitral regurgitation.   05/06/2020: LVEF 60-65%, hyperdynamic LVEF, moderate LVH, grade 2 diastolic impairment, elevated LVEDP, RV systolic function and size are within normal limits, trivial TR, trivial MR, mild to moderate aortic valve sclerosis without stenosis.  Stress Testing: Lexiscan/modified Bruce Tetrofosmin stress test 02/23/2020: Stress EKG showed  sinus tachycardia, inferolateral T wave inversion.  SPECT images show small sized, medium intensity, mid to basal inferolateral perfusion defect with mild reversibility. Stress LVEF 82%. Low risk study.  Coronary CTA 04/26/2020: 1. Coronary calcium score of 1101. This was 89th percentile for age and sex matched control. 2. Normal coronary origin with right dominance. 3.  Minimal calcified plaque within the left main artery. Moderate to severe stenosis within the proximal/mid LAD segment to calcified plaque. Severe stenosis at the ostial segment of the 1st diagonal branch due calcified plaque. Severe stenosis within the proximal segment due to eccentric calcified plaque within the LCX. Moderate stenosis within the distal RCA due to calcified plaque. Of note, severity of the stenosis may be overestimated due to blooming artifact caused by calcified plaque. 4. CADRADS = 4. Cardiac catheterization or CT FFR is recommended. Consider symptom-guided anti-ischemic pharmacotherapy as well as risk factor modification per guideline directed care. Invasive coronary angiography recommended with revascularization per published guideline statements. 4. Study is sent for CT-FFR findings will be performed and reported   CT-FFR 04/27/2020: CT FFR analysis showed no significant stenosis.  Heart Catheterization: Left Heart Catheterization 05/08/20:  Hyperdynamic LVEF, EF 65 to 70%.  Mildly elevated LV EDP. Mild diffuse coronary artery disease with mild coronary calcification involving LAD, circumflex and dominant RCA.  LAD is very small and gives origin to a very large LAD: D1 which reaches the apex. Recommendation: Patient symptoms are related to acute diastolic heart failure with elevated LVEDP.  LABORATORY DATA: CBC Latest Ref Rng & Units 05/11/2020 05/08/2020 05/07/2020  WBC 4.0 - 10.5 K/uL 13.0(H) 12.3(H) 14.4(H)  Hemoglobin 13.0 - 17.0 g/dL 11.6(L) 12.0(L) 11.7(L)  Hematocrit 39 - 52 % 36.8(L) 35.9(L)  36.0(L)  Platelets 150 - 400 K/uL 365 324 296    CMP Latest Ref Rng & Units 05/23/2020 05/11/2020 05/08/2020  Glucose 65 - 99 mg/dL 144(H) 103(H) 159(H)  BUN 8 - 27 mg/dL 16 15 29(H)  Creatinine 0.76 - 1.27 mg/dL 0.85 1.15 1.26(H)  Sodium 134 - 144 mmol/L 141 138 135  Potassium 3.5 - 5.2 mmol/L 3.7 4.7 3.5  Chloride 96 - 106 mmol/L 103 104 99  CO2 20 - 29 mmol/L 24 26 25   Calcium 8.6 - 10.2 mg/dL 9.1 9.1 8.9  Total Protein 6.5 - 8.1 g/dL - - -  Total Bilirubin 0.3 - 1.2 mg/dL - - -  Alkaline Phos 38 - 126 U/L - - -  AST 15 - 41 U/L - - -  ALT 0 - 44 U/L - - -   Hepatic Function Panel Recent Labs    02/16/20 1054 05/05/20 1243 05/06/20 0303  PROT 6.7 7.0 6.9  ALBUMIN 3.5 3.5 3.2*  AST 17 16 13*  ALT 21 15 15   ALKPHOS 94 72 73  BILITOT 0.6 0.6 1.0   External Labs: Lipid Panel 12/30/2019: total 155, Triglycerides 167, HDL 44, LDL 78  12/30/2019: A1C 5.4% Glucose Random 115 BUN 14, Creatinine, Serum 0.9  03/19/2019: PSA normal   06/05/2018: TSH 1.000  IMPRESSION:    ICD-10-CM  1. DOE (dyspnea on exertion)  R06.00 torsemide (DEMADEX) 10 MG tablet    metolazone (ZAROXOLYN) 2.5 MG tablet    Basic metabolic panel    Magnesium    Pro b natriuretic peptide (BNP)    Magnesium    Basic metabolic panel    Pro b natriuretic peptide (BNP)  2. Acute heart failure with preserved ejection fraction (HFpEF) (HCC)  I50.31   3. Nonobstructive atherosclerosis of coronary artery  I25.10   4. Coronary artery calcification  I25.10    I25.84   5. Mixed hyperlipidemia  E78.2   6. Former smoker  Z87.891   13. Benign hypertension  I10   8. Class 3 severe obesity due to excess calories with serious comorbidity and body mass index (BMI) of 40.0 to 44.9 in adult (HCC)  E66.01    Z68.41   9. Encounter to discuss test results  Z71.2      RECOMMENDATIONS: Fadi Menter. is a 69 y.o. male whose past medical history and cardiac risk factors include: Nonobstructive coronary artery  disease, severe coronary artery calcification 1101 AU, acute on chronic HFpEF/stage C/NYHA class III, prediabetes, hyperlipidemia, hypertension, advanced age, former smoker, obesity due to excess calories.  Acute on chronic heart failure preserved EF, stage C, NYHA class III:  Since last visit patient has gained approximately 7 pounds, has lower extremity swelling, and orthopnea.  Results of the chest x-ray reviewed.  Increase torsemide to 10 mg p.o. every morning  And Zaroxolyn 2.5 mg p.o. q. MWF  Check BMP and magnesium in 1 week to evaluate kidney function electrolytes.  Change Cardizem 240 mg CD p.o. daily  Continue ARB.  Patient will be provided a weight scale given his HFpEF and titration of medications.   Coronary atherosclerosis due to calcified coronary lesions in the native artery without angina pectoris:  Continue aspirin.  Continue statin therapy. LDL is less than 70mg  /dL.   Educated on importance of improving modifiable cardiovascular risk factors for the prevention/progression of CAD  Benign essential hypertension:  Patient's blood pressure at today's office visit is not at goal.  But he is enrolled into ambulatory blood pressure monitoring and BP log reviewed.   Medications reconciled.  Low salt diet recommended.   Mixed hyperlipidemia: Continue lipitor.  Obesity, due to excess calories: Body mass index is 41.97 kg/m. . I reviewed with the patient the importance of diet, regular physical activity/exercise, weight loss.   . Patient is educated on increasing physical activity gradually as tolerated.  With the goal of moderate intensity exercise for 30 minutes a day 5 days a week.  Former smoker: Educated on the importance of continued smoking cessation.  FINAL MEDICATION LIST END OF ENCOUNTER: Meds ordered this encounter  Medications  . torsemide (DEMADEX) 10 MG tablet    Sig: Take 1 tablet (10 mg total) by mouth in the morning.    Dispense:  90  tablet    Refill:  0  . metolazone (ZAROXOLYN) 2.5 MG tablet    Sig: Take 1 tablet (2.5 mg total) by mouth every Monday, Wednesday, and Friday for 30 doses.    Dispense:  30 tablet    Refill:  0    Current Outpatient Medications:  .  abiraterone acetate (ZYTIGA) 250 MG tablet, TAKE 4 TABLETS (1,000 MG TOTAL) BY MOUTH DAILY. TAKE ON AN EMPTY STOMACH, 1 HOUR BEFORE OR 2 HOURS AFTER A MEAL., Disp: 120 tablet, Rfl: 11 .  aspirin EC 81 MG tablet, Take 1 tablet (  81 mg total) by mouth daily., Disp: 90 tablet, Rfl: 3 .  atorvastatin (LIPITOR) 80 MG tablet, Take 1 tablet (80 mg total) by mouth daily., Disp: 30 tablet, Rfl: 0 .  diltiazem (DILTIAZEM CD) 240 MG 24 hr capsule, Take 1 capsule (240 mg total) by mouth in the morning., Disp: 90 capsule, Rfl: 0 .  losartan (COZAAR) 50 MG tablet, TAKE ONE TABLET by mouth EVERY EVENING, Disp: 90 tablet, Rfl: 0 .  methocarbamol (ROBAXIN) 500 MG tablet, Take 1 tablet by mouth daily as needed for muscle spasms. , Disp: , Rfl:  .  Multiple Vitamins-Minerals (MULTIVITAMIN WITH MINERALS) tablet, Take 1 tablet by mouth daily., Disp: , Rfl:  .  predniSONE (DELTASONE) 5 MG tablet, TAKE 1 TABLET BY MOUTH DAILY WITH BREAKFAST (Patient taking differently: Take 5 mg by mouth daily with breakfast. ), Disp: 30 tablet, Rfl: 2 .  solifenacin (VESICARE) 5 MG tablet, Take 5 mg by mouth daily., Disp: , Rfl:  .  tamsulosin (FLOMAX) 0.4 MG CAPS capsule, Take 1 capsule (0.4 mg total) by mouth daily after supper. (Patient taking differently: Take 0.4 mg by mouth at bedtime. ), Disp: 30 capsule, Rfl: 0 .  traMADol (ULTRAM) 50 MG tablet, Take 50 mg by mouth 2 (two) times daily as needed for moderate pain. , Disp: , Rfl:  .  arformoterol (BROVANA) 15 MCG/2ML NEBU, Take 2 mLs (15 mcg total) by nebulization 2 (two) times daily., Disp: 120 mL, Rfl: 6 .  budesonide (PULMICORT) 0.25 MG/2ML nebulizer solution, Take 2 mLs (0.25 mg total) by nebulization in the morning and at bedtime., Disp: 60 mL,  Rfl: 12 .  metolazone (ZAROXOLYN) 2.5 MG tablet, Take 1 tablet (2.5 mg total) by mouth every Monday, Wednesday, and Friday for 30 doses., Disp: 30 tablet, Rfl: 0 .  torsemide (DEMADEX) 10 MG tablet, Take 1 tablet (10 mg total) by mouth in the morning., Disp: 90 tablet, Rfl: 0  Orders Placed This Encounter  Procedures  . Basic metabolic panel  . Magnesium  . Pro b natriuretic peptide (BNP)   --Continue cardiac medications as reconciled in final medication list. --Return in about 12 days (around 06/06/2020) for heart failure management.. Or sooner if needed. --Continue follow-up with your primary care physician regarding the management of your other chronic comorbid conditions.  Total time spent: 30 minutes.  Patient's questions and concerns were addressed to his satisfaction. He voices understanding of the instructions provided during this encounter.   This note was created using a voice recognition software as a result there may be grammatical errors inadvertently enclosed that do not reflect the nature of this encounter. Every attempt is made to correct such errors.  Rex Kras, [place], [facility]  Pager: [phone] Office: [phone]

## 2020-05-30 ENCOUNTER — Encounter: Payer: Self-pay | Admitting: Pulmonary Disease

## 2020-05-30 ENCOUNTER — Ambulatory Visit: Payer: Medicare HMO | Admitting: Pulmonary Disease

## 2020-05-30 ENCOUNTER — Other Ambulatory Visit: Payer: Self-pay

## 2020-05-30 ENCOUNTER — Telehealth: Payer: Self-pay | Admitting: Pulmonary Disease

## 2020-05-30 VITALS — BP 110/64 | HR 82 | Temp 98.7°F | Ht 67.0 in | Wt 246.6 lb

## 2020-05-30 DIAGNOSIS — E669 Obesity, unspecified: Secondary | ICD-10-CM

## 2020-05-30 DIAGNOSIS — R0602 Shortness of breath: Secondary | ICD-10-CM

## 2020-05-30 DIAGNOSIS — G4730 Sleep apnea, unspecified: Secondary | ICD-10-CM

## 2020-05-30 DIAGNOSIS — I5033 Acute on chronic diastolic (congestive) heart failure: Secondary | ICD-10-CM

## 2020-05-30 MED ORDER — BUDESONIDE 0.25 MG/2ML IN SUSP: 0.2500 mg | Freq: Two times a day (BID) | RESPIRATORY_TRACT | 12 refills | Status: DC

## 2020-05-30 MED ORDER — ARFORMOTEROL TARTRATE 15 MCG/2ML IN NEBU: 15.0000 ug | INHALATION_SOLUTION | Freq: Two times a day (BID) | RESPIRATORY_TRACT | 6 refills | Status: DC

## 2020-05-30 NOTE — Progress Notes (Signed)
Synopsis: Referred by [name], MD for shortness of breath  Subjective:   PATIENT ID: Bobby [name]. GENDER: male DOB: October 11, 1950, MRN: [id number]   HPI  Chief Complaint  Patient presents with  . Consult    SOB all the time   Bobby [name] is a 69 year old male, former smoker with heart failure preserved EF, atrial fibrillation, hypertension and hyperlipidemia who is referred to pulmonary clinic for evaluation of shortness of breath.   Patient was recently hospitalized for shortness of breath and underwent heart catheterization which did not indicate occlusive coronary artery disease. He had an ECHO performed as well which indicated diastolic heart failure. He had bilateral pleural effusions on chest imaging and lower extremity edema on exam. He was started on diuretic therapy with torsemide and has lost 14lbs since his hospitalization. He reports his breathing has improved, but remains very short of breath. He was also treated for pneumonia during this hospitalization with antibiotics.    He reports having pneumonia one other time which was in December 2020 when a majority of his breathing issues began. He does report intermittent wheezing and cough. He does produce frothy white/clear sputum. Denies hemoptysis. He does have sinus congestion and drainage. He has albuterol nebs at home which do alleviate his shortness of breath somewhat.  His wife has accompanied him on this visit. She reports he has history of snoring during his sleep. He is not able to lay flat due to shortness of breath. He has paroxymal nocturnal dyspnea. She does report having to check on him while sleeping to make sure he is breathing as he has had some apneas noted. He sleeps in a straight chair.    Past Medical History:  Diagnosis Date  . Bronchitis   . CHF (congestive heart failure) ([facility]) 04/2020   A/C HFpEF  . Coronary artery calcification of native artery   . Family history of pancreatic cancer     . History of kidney stones   . Hyperlipidemia   . Hypertension   . Pneumonia   . Prostate cancer [facility])      Family History  Problem Relation Age of Onset  . Breast cancer Sister 5       "negative genetic testing"  . Pancreatic cancer Brother 25  . Breast cancer Maternal Aunt   . Kidney cancer Maternal Aunt        dx 2s     Social History   Socioeconomic History  . Marital status: Married    Spouse name: Not on file  . Number of children: 2  . Years of education: Not on file  . Highest education level: Not on file  Occupational History  . Not on file  Tobacco Use  . Smoking status: Former Smoker    Packs/day: 0.25    Years: 10.00    Pack years: 2.50    Types: Cigarettes    Quit date: 01/20/2012    Years since quitting: 8.3  . Smokeless tobacco: Never Used  Vaping Use  . Vaping Use: Never used  Substance and Sexual Activity  . Alcohol use: No  . Drug use: No  . Sexual activity: Yes  Other Topics Concern  . Not on file  Social History Narrative  . Not on file   Social Determinants of Health   Financial Resource Strain:   . Difficulty of Paying Living Expenses: Not on file  Food Insecurity:   . Worried About Charity fundraiser in the  Last Year: Not on file  . Ran Out of Food in the Last Year: Not on file  Transportation Needs:   . Lack of Transportation (Medical): Not on file  . Lack of Transportation (Non-Medical): Not on file  Physical Activity:   . Days of Exercise per Week: Not on file  . Minutes of Exercise per Session: Not on file  Stress:   . Feeling of Stress : Not on file  Social Connections:   . Frequency of Communication with Friends and Family: Not on file  . Frequency of Social Gatherings with Friends and Family: Not on file  . Attends Religious Services: Not on file  . Active Member of Clubs or Organizations: Not on file  . Attends Archivist Meetings: Not on file  . Marital Status: Not on file  Intimate Partner Violence:    . Fear of Current or Ex-Partner: Not on file  . Emotionally Abused: Not on file  . Physically Abused: Not on file  . Sexually Abused: Not on file     No Known Allergies   Outpatient Medications Prior to Visit  Medication Sig Dispense Refill  . abiraterone acetate (ZYTIGA) 250 MG tablet TAKE 4 TABLETS (1,000 MG TOTAL) BY MOUTH DAILY. TAKE ON AN EMPTY STOMACH, 1 HOUR BEFORE OR 2 HOURS AFTER A MEAL. 120 tablet 11  . aspirin EC 81 MG tablet Take 1 tablet (81 mg total) by mouth daily. 90 tablet 3  . atorvastatin (LIPITOR) 80 MG tablet Take 1 tablet (80 mg total) by mouth daily. 30 tablet 0  . diltiazem (DILTIAZEM CD) 240 MG 24 hr capsule Take 1 capsule (240 mg total) by mouth in the morning. 90 capsule 0  . losartan (COZAAR) 50 MG tablet TAKE ONE TABLET by mouth EVERY EVENING 90 tablet 0  . methocarbamol (ROBAXIN) 500 MG tablet Take 1 tablet by mouth daily as needed for muscle spasms.     . metolazone (ZAROXOLYN) 2.5 MG tablet Take 1 tablet (2.5 mg total) by mouth every Monday, Wednesday, and Friday for 30 doses. 30 tablet 0  . Multiple Vitamins-Minerals (MULTIVITAMIN WITH MINERALS) tablet Take 1 tablet by mouth daily.    . predniSONE (DELTASONE) 5 MG tablet TAKE 1 TABLET BY MOUTH DAILY WITH BREAKFAST (Patient taking differently: Take 5 mg by mouth daily with breakfast. ) 30 tablet 2  . solifenacin (VESICARE) 5 MG tablet Take 5 mg by mouth daily.    . tamsulosin (FLOMAX) 0.4 MG CAPS capsule Take 1 capsule (0.4 mg total) by mouth daily after supper. (Patient taking differently: Take 0.4 mg by mouth at bedtime. ) 30 capsule 0  . torsemide (DEMADEX) 10 MG tablet Take 1 tablet (10 mg total) by mouth in the morning. 90 tablet 0  . traMADol (ULTRAM) 50 MG tablet Take 50 mg by mouth 2 (two) times daily as needed for moderate pain.      No facility-administered medications prior to visit.   Review of Systems  Constitutional: Negative for chills, fever and malaise/fatigue.  HENT: Positive for  congestion. Negative for sinus pain and sore throat.   Eyes: Negative for blurred vision and pain.  Respiratory: Positive for cough, sputum production (clear, frothy), shortness of breath and wheezing. Negative for hemoptysis.   Cardiovascular: Positive for orthopnea, leg swelling and PND. Negative for chest pain, palpitations and claudication.  Gastrointestinal: Negative for abdominal pain, blood in stool, diarrhea, heartburn, melena, nausea and vomiting.  Genitourinary: Negative for dysuria, frequency and hematuria.  Musculoskeletal: Negative for  joint pain and myalgias.  Skin: Negative for itching and rash.  Neurological: Negative for dizziness, weakness and headaches.  Endo/Heme/Allergies: Does not bruise/bleed easily.  Psychiatric/Behavioral: Negative.    Objective:   Vitals:   05/30/20 1150  BP: 110/64  Pulse: 82  Temp: 98.7 F (37.1 C)  TempSrc: Oral  SpO2: 96%  Weight: 246 lb 9.6 oz (111.9 kg)  Height: 5\' 7"  (1.702 m)     Physical Exam Constitutional:      Appearance: Normal appearance. He is obese.  HENT:     Head: Normocephalic and atraumatic.     Nose: Nose normal. No rhinorrhea.     Mouth/Throat:     Mouth: Mucous membranes are moist.     Pharynx: Oropharynx is clear.  Eyes:     General: No scleral icterus.    Extraocular Movements: Extraocular movements intact.     Conjunctiva/sclera: Conjunctivae normal.     Pupils: Pupils are equal, round, and reactive to light.  Cardiovascular:     Rate and Rhythm: Normal rate and regular rhythm.     Pulses: Normal pulses.     Heart sounds: Murmur (systolic ) heard.   Pulmonary:     Effort: Respiratory distress (mild) present.     Breath sounds: No wheezing or rhonchi.     Comments: Diminished lung sounds throughout Abdominal:     General: Bowel sounds are normal. There is no distension.     Palpations: Abdomen is soft.     Tenderness: There is no abdominal tenderness.  Musculoskeletal:     Right lower leg: Edema  (2+) present.     Left lower leg: Edema (2+) present.  Skin:    General: Skin is warm and dry.     Findings: No erythema or rash.  Neurological:     General: No focal deficit present.     Mental Status: He is alert and oriented to person, place, and time. Mental status is at baseline.     Gait: Gait normal.  Psychiatric:        Mood and Affect: Mood normal.        Behavior: Behavior normal.        Thought Content: Thought content normal.        Judgment: Judgment normal.     CBC    Component Value Date/Time   WBC 13.0 (H) 05/11/2020 1752   RBC 4.21 (L) 05/11/2020 1752   HGB 11.6 (L) 05/11/2020 1752   HGB 13.2 02/16/2020 1054   HGB 15.2 09/10/2017 1509   HCT 36.8 (L) 05/11/2020 1752   HCT 45.5 09/10/2017 1509   PLT 365 05/11/2020 1752   PLT 233 02/16/2020 1054   PLT Clumped Platelets--Appears Adequate 09/10/2017 1509   MCV 87.4 05/11/2020 1752   MCV 84.7 09/10/2017 1509   MCH 27.6 05/11/2020 1752   MCHC 31.5 05/11/2020 1752   RDW 14.6 05/11/2020 1752   RDW 14.4 09/10/2017 1509   LYMPHSABS 1.0 05/06/2020 0303   LYMPHSABS 1.1 09/10/2017 1509   MONOABS 1.1 (H) 05/06/2020 0303   MONOABS 0.5 09/10/2017 1509   EOSABS 0.1 05/06/2020 0303   EOSABS 0.1 09/10/2017 1509   BASOSABS 0.0 05/06/2020 0303   BASOSABS 0.1 09/10/2017 1509    Chest imaging: CXR 05/18/20 reviewed: Bilateral pleural effusions (improved somewhat), interstitial thickening. CTA Chest 05/05/20 reviewed: Bilateral pleural effusions with bibasilar compressive atelectasis. Fluid tracking along left major fissure. Dense airspace consolidation of left base.   PFT: None on file  Labs: Reviewed, as  above. BNP 678 on 05/23/20  Echo: EF 60-65%. LV is hyperdynamic with moderate LVH. Grade II diastolic dysfunction of LV. RV systolic function is normal. Mild to moderate aortic valve sclerosis. Tricuspid regurgitation present.   Heart Catheterization: 05/08/30: Hyperdynamic LVEF, EF 65 to 70%.  Mildly elevated LV  EDP. Mild diffuse coronary artery disease with mild coronary calcification involving LAD, circumflex and dominant RCA.  LAD is very small and gives origin to a very large LAD: D1 which reaches the apex.     Assessment & Plan:   Shortness of breath - Plan: Pulmonary Function Test  Sleep-disordered breathing - Plan: Home sleep test  Acute on chronic diastolic CHF (congestive heart failure) (HCC)  Obesity (BMI 35.0-39.9 without comorbidity)  Discussion: Bobby [name] is a 69 year old male, former smoker with HFpEF and obesity who has been referred for evaluation of shortness of breath. His dyspnea is multifactorial due to congestive heart failure with volume overload, high suspicion for underlying sleep disordered breathing and possible underlying obstructive lung disease.   We will check a home sleep study to evaluate concern for underlying sleep apnea. He may also have underlying cheyne stokes breathing related to his heart failure which will need to be monitored if he qualifies for CPAP or BIPAP therapy.   We will obtain pulmonary function tests once he was been adequately diuresed. We will schedule these in 1 month. He is to start budesonide + brovana nebulizer treatments as he does find relief from his albuterol nebulizer treatments. He prefers nebulizer treatments over inhaler therapy.   He is following with cardiology for the diastolic congestive heart failure. He remains volume overloaded on exam today with 2+ pitting edema. Diuretic management per cardiology team.   Once he has been optimized from a sleep standpoint as well as a pulmonary and cardiac standpoint, he would benefit from weight loss management.  Patient to follow up in 2 months  Freda Jackson, MD [facility] Office: [phone]   Current Outpatient Medications:  .  abiraterone acetate (ZYTIGA) 250 MG tablet, TAKE 4 TABLETS (1,000 MG TOTAL) BY MOUTH DAILY. TAKE ON AN EMPTY STOMACH, 1 HOUR  BEFORE OR 2 HOURS AFTER A MEAL., Disp: 120 tablet, Rfl: 11 .  aspirin EC 81 MG tablet, Take 1 tablet (81 mg total) by mouth daily., Disp: 90 tablet, Rfl: 3 .  atorvastatin (LIPITOR) 80 MG tablet, Take 1 tablet (80 mg total) by mouth daily., Disp: 30 tablet, Rfl: 0 .  diltiazem (DILTIAZEM CD) 240 MG 24 hr capsule, Take 1 capsule (240 mg total) by mouth in the morning., Disp: 90 capsule, Rfl: 0 .  losartan (COZAAR) 50 MG tablet, TAKE ONE TABLET by mouth EVERY EVENING, Disp: 90 tablet, Rfl: 0 .  methocarbamol (ROBAXIN) 500 MG tablet, Take 1 tablet by mouth daily as needed for muscle spasms. , Disp: , Rfl:  .  metolazone (ZAROXOLYN) 2.5 MG tablet, Take 1 tablet (2.5 mg total) by mouth every Monday, Wednesday, and Friday for 30 doses., Disp: 30 tablet, Rfl: 0 .  Multiple Vitamins-Minerals (MULTIVITAMIN WITH MINERALS) tablet, Take 1 tablet by mouth daily., Disp: , Rfl:  .  predniSONE (DELTASONE) 5 MG tablet, TAKE 1 TABLET BY MOUTH DAILY WITH BREAKFAST (Patient taking differently: Take 5 mg by mouth daily with breakfast. ), Disp: 30 tablet, Rfl: 2 .  solifenacin (VESICARE) 5 MG tablet, Take 5 mg by mouth daily., Disp: , Rfl:  .  tamsulosin (FLOMAX) 0.4 MG CAPS capsule, Take 1 capsule (  0.4 mg total) by mouth daily after supper. (Patient taking differently: Take 0.4 mg by mouth at bedtime. ), Disp: 30 capsule, Rfl: 0 .  torsemide (DEMADEX) 10 MG tablet, Take 1 tablet (10 mg total) by mouth in the morning., Disp: 90 tablet, Rfl: 0 .  traMADol (ULTRAM) 50 MG tablet, Take 50 mg by mouth 2 (two) times daily as needed for moderate pain. , Disp: , Rfl:  .  arformoterol (BROVANA) 15 MCG/2ML NEBU, Take 2 mLs (15 mcg total) by nebulization 2 (two) times daily., Disp: 120 mL, Rfl: 6 .  budesonide (PULMICORT) 0.25 MG/2ML nebulizer solution, Take 2 mLs (0.25 mg total) by nebulization in the morning and at bedtime., Disp: 60 mL, Rfl: 12

## 2020-05-30 NOTE — Patient Instructions (Signed)
-   Start budesonide and brovana nebulizer treatments twice daily (do not use brovana alone) - Continue to use albuterol nebulizer treatments as needed - We will schedule you for pulmonary function tests in 1 month - We will schedule you for a home sleep study and call you with the results

## 2020-05-30 NOTE — Telephone Encounter (Signed)
Called and spoke with pt's daughter Bobby [name] who stated that the brovana solution is too expensive at pt's local pharmacy. Bobby [name] stated that she does not believe pt has Medicare Part B.  Stated to her that we could try to send pt's neb sol to a DME pharmacy to see if the cost would be better. She stated she was going to go discuss this with pt and then would call us back. Will keep encounter open.

## 2020-05-31 DIAGNOSIS — R69 Illness, unspecified: Secondary | ICD-10-CM | POA: Diagnosis not present

## 2020-05-31 DIAGNOSIS — M545 Low back pain: Secondary | ICD-10-CM | POA: Diagnosis not present

## 2020-06-01 NOTE — Telephone Encounter (Signed)
Called and spoke to [place]. She states she picked up one month from local pharmacy and the remaining script/refills were sent to [facility] in [place].   [facility] will be sending a fax regarding the script. Will forward to [place] to look out for form.

## 2020-06-02 DIAGNOSIS — R0600 Dyspnea, unspecified: Secondary | ICD-10-CM | POA: Diagnosis not present

## 2020-06-02 DIAGNOSIS — I5031 Acute diastolic (congestive) heart failure: Secondary | ICD-10-CM | POA: Diagnosis not present

## 2020-06-03 LAB — BASIC METABOLIC PANEL
BUN/Creatinine Ratio: 15 (ref 10–24)
BUN: 17 mg/dL (ref 8–27)
CO2: 33 mmol/L — ABNORMAL HIGH (ref 20–29)
Calcium: 9.4 mg/dL (ref 8.6–10.2)
Chloride: 92 mmol/L — ABNORMAL LOW (ref 96–106)
Creatinine, Ser: 1.13 mg/dL (ref 0.76–1.27)
GFR calc Af Amer: 77 mL/min/{1.73_m2} (ref >59)
GFR calc non Af Amer: 66 mL/min/{1.73_m2} (ref >59)
Glucose: 106 mg/dL — ABNORMAL HIGH (ref 65–99)
Potassium: 3.2 mmol/L — ABNORMAL LOW (ref 3.5–5.2)
Sodium: 138 mmol/L (ref 134–144)

## 2020-06-03 LAB — MAGNESIUM: Magnesium: 2.1 mg/dL (ref 1.6–2.3)

## 2020-06-03 LAB — PRO B NATRIURETIC PEPTIDE: NT-Pro BNP: 498 pg/mL — ABNORMAL HIGH (ref 0–376)

## 2020-06-05 NOTE — Telephone Encounter (Signed)
I have not received the fax from [facility] will contact [facility] resend fax.

## 2020-06-06 ENCOUNTER — Encounter: Payer: Self-pay | Admitting: Cardiology

## 2020-06-06 ENCOUNTER — Other Ambulatory Visit: Payer: Self-pay

## 2020-06-06 ENCOUNTER — Ambulatory Visit: Payer: Medicare HMO | Admitting: Cardiology

## 2020-06-06 VITALS — BP 110/67 | HR 91 | Resp 16 | Ht 67.0 in | Wt 238.6 lb

## 2020-06-06 DIAGNOSIS — E876 Hypokalemia: Secondary | ICD-10-CM

## 2020-06-06 DIAGNOSIS — E782 Mixed hyperlipidemia: Secondary | ICD-10-CM

## 2020-06-06 DIAGNOSIS — Z87891 Personal history of nicotine dependence: Secondary | ICD-10-CM | POA: Diagnosis not present

## 2020-06-06 DIAGNOSIS — I2584 Coronary atherosclerosis due to calcified coronary lesion: Secondary | ICD-10-CM

## 2020-06-06 DIAGNOSIS — Z6841 Body Mass Index (BMI) 40.0 and over, adult: Secondary | ICD-10-CM | POA: Diagnosis not present

## 2020-06-06 DIAGNOSIS — I2510 Atherosclerotic heart disease of native coronary artery without angina pectoris: Secondary | ICD-10-CM | POA: Diagnosis not present

## 2020-06-06 DIAGNOSIS — I5031 Acute diastolic (congestive) heart failure: Secondary | ICD-10-CM | POA: Diagnosis not present

## 2020-06-06 DIAGNOSIS — I10 Essential (primary) hypertension: Secondary | ICD-10-CM

## 2020-06-06 DIAGNOSIS — R0609 Other forms of dyspnea: Secondary | ICD-10-CM | POA: Diagnosis not present

## 2020-06-06 MED ORDER — POTASSIUM CHLORIDE CRYS ER 20 MEQ PO TBCR: 20.0000 meq | EXTENDED_RELEASE_TABLET | Freq: Two times a day (BID) | ORAL | 2 refills | Status: DC

## 2020-06-06 NOTE — Progress Notes (Signed)
Date:  06/06/2020   ID:  Bobby Forster., DOB [birth year], MRN [id number]  PCP:  [name], MD  Cardiologist: Rex Kras, DO, [facility] (established care 01/21/2020)  Date: 06/06/20 Last Office Visit: 05/25/2020  Chief Complaint  Patient presents with  . DOE (dyspnea on exertion)  . Follow-up    12 days     Bobby Wik. is a 69 y.o. male who presents to the office with a  chief complaint of "shortness of breath and heart failure management."  His past medical history and cardiovascular risk factors are: Nonobstructive coronary artery disease, severe coronary artery calcification 1101 AU, acute on chronic HFpEF/stage C/NYHA class III, prediabetes, hyperlipidemia, hypertension, advanced age, former smoker, obesity due to excess calories.  Patient is accompanied today by his wife [name].   Patient was originally referred to the office for evaluation of  coronary artery calcification.   Given the coronary artery calcification and dyspnea on exertion patient underwent an ischemic evaluation including a coronary CTA results and thereafter underwent left heart catheterization during one of his subsequent hospitalizations results noted below.    Patient recently diagnosed with heart failure with preserved EF and his medications have been titrated. Since last office visit patient has lost approximately 20 pounds due to diuresis. Patient states that his shortness of breath has improved significantly. He is able to wear his socks only flat more comfortably than before.   However, patient states that he still feels tired, fatigued, sleepy during the morning hours, and at times dizzy with changing positions. He denies any syncopal events.   For the last 1 week thinks that diltiazem is a systolic blood pressures was around 110 mmHg.   Most recent labs from 06/02/2020 reviewed. Kidney function remains stable but he does have hypokalemia.   No family history of premature coronary  disease or sudden cardiac death.  Denies prior history of myocardial infarction, deep venous thrombosis, pulmonary embolism, stroke, transient ischemic attack.  FUNCTIONAL STATUS: Limited due to back pain.    ALLERGIES: No Known Allergies  MEDICATION LIST PRIOR TO VISIT: Current Meds  Medication Sig  . abiraterone acetate (ZYTIGA) 250 MG tablet TAKE 4 TABLETS (1,000 MG TOTAL) BY MOUTH DAILY. TAKE ON AN EMPTY STOMACH, 1 HOUR BEFORE OR 2 HOURS AFTER A MEAL.  Marland Kitchen arformoterol (BROVANA) 15 MCG/2ML NEBU Take 2 mLs (15 mcg total) by nebulization 2 (two) times daily.  Marland Kitchen aspirin EC 81 MG tablet Take 1 tablet (81 mg total) by mouth daily.  Marland Kitchen atorvastatin (LIPITOR) 80 MG tablet Take 1 tablet (80 mg total) by mouth daily.  . budesonide (PULMICORT) 0.25 MG/2ML nebulizer solution Take 2 mLs (0.25 mg total) by nebulization in the morning and at bedtime.  Marland Kitchen diltiazem (DILTIAZEM CD) 240 MG 24 hr capsule Take 1 capsule (240 mg total) by mouth in the morning.  Marland Kitchen losartan (COZAAR) 50 MG tablet TAKE ONE TABLET by mouth EVERY EVENING  . methocarbamol (ROBAXIN) 500 MG tablet Take 1 tablet by mouth daily as needed for muscle spasms.   . Multiple Vitamins-Minerals (MULTIVITAMIN WITH MINERALS) tablet Take 1 tablet by mouth daily.  . predniSONE (DELTASONE) 5 MG tablet TAKE 1 TABLET BY MOUTH DAILY WITH BREAKFAST (Patient taking differently: Take 5 mg by mouth daily with breakfast. )  . solifenacin (VESICARE) 5 MG tablet Take 5 mg by mouth daily.  . tamsulosin (FLOMAX) 0.4 MG CAPS capsule Take 1 capsule (0.4 mg total) by mouth daily after supper. (Patient taking differently: Take 0.4  mg by mouth at bedtime. )  . torsemide (DEMADEX) 10 MG tablet Take 1 tablet (10 mg total) by mouth in the morning.  . traMADol (ULTRAM) 50 MG tablet Take 50 mg by mouth 2 (two) times daily as needed for moderate pain.   . [DISCONTINUED] metolazone (ZAROXOLYN) 2.5 MG tablet Take 1 tablet (2.5 mg total) by mouth every Monday, Wednesday, and  Friday for 30 doses.     PAST MEDICAL HISTORY: Past Medical History:  Diagnosis Date  . Bronchitis   . CHF (congestive heart failure) ([place]) 04/2020   A/C HFpEF  . Coronary artery calcification of native artery   . Family history of pancreatic cancer   . History of kidney stones   . Hyperlipidemia   . Hypertension   . Pneumonia   . Prostate cancer ([place])     PAST SURGICAL HISTORY: Past Surgical History:  Procedure Laterality Date  . EXTRACORPOREAL SHOCK WAVE LITHOTRIPSY Right 04/17/2017   Procedure: RIGHT EXTRACORPOREAL SHOCK WAVE LITHOTRIPSY (ESWL);  Surgeon: Kathie Rhodes, MD;  Location: [facility];  Service: Urology;  Laterality: Right;  . HERNIA REPAIR     age 24  . LEFT HEART CATH AND CORONARY ANGIOGRAPHY N/A 05/08/2020   Procedure: LEFT HEART CATH AND CORONARY ANGIOGRAPHY;  Surgeon: Adrian Prows, MD;  Location: [place] [facility];  Service: Cardiovascular;  Laterality: N/A;  . PROSTATE BIOPSY      FAMILY HISTORY: The patient family history includes Breast cancer in his maternal aunt; Breast cancer (age of onset: 57) in his sister; Kidney cancer in his maternal aunt; Pancreatic cancer (age of onset: 36) in his brother.  SOCIAL HISTORY:  The patient  reports that he quit smoking about 8 years ago. His smoking use included cigarettes. He has a 2.50 pack-year smoking history. He has never used smokeless tobacco. He reports that he does not drink alcohol and does not use drugs.  REVIEW OF SYSTEMS: Review of Systems  Constitutional: Positive for malaise/fatigue and weight loss. Negative for chills and fever.  HENT: Negative for hoarse voice and nosebleeds.   Eyes: Negative for discharge, double vision and pain.  Cardiovascular: Positive for dyspnea on exertion (improving). Negative for chest pain, claudication, leg swelling, near-syncope, orthopnea, palpitations, paroxysmal nocturnal dyspnea and syncope.  Respiratory: Positive for shortness of breath (improving). Negative for  hemoptysis.        Daytime sleepiness  Musculoskeletal: Negative for muscle cramps and myalgias.  Gastrointestinal: Negative for abdominal pain, constipation, diarrhea, hematemesis, hematochezia, melena, nausea and vomiting.  Neurological: Negative for dizziness and light-headedness.   PHYSICAL EXAM: Vitals with BMI 06/06/2020 05/30/2020 05/25/2020  Height 5\' 7"  5\' 7"  5\' 6"   Weight 238 lbs 10 oz 246 lbs 10 oz 260 lbs  BMI 37.36 27.06 23.76  Systolic 283 151 761  Diastolic 67 64 71  Pulse 91 82 85    CONSTITUTIONAL: Well-developed and well-nourished. No acute distress.  SKIN: Skin is warm and dry. No rash noted. No cyanosis. No pallor. No jaundice HEAD: Normocephalic and atraumatic.  EYES: No scleral icterus MOUTH/THROAT: Moist oral membranes.  NECK: No JVD present. No thyromegaly noted. No carotid bruits  LYMPHATIC: No visible cervical adenopathy.  CHEST Normal respiratory effort. No intercostal retractions  LUNGS: Clear to auscultation bilaterally. No stridor. No wheezes. No rales.  CARDIOVASCULAR: Regular rate and rhythm, positive S1-S2, no murmurs rubs or gallops appreciated. ABDOMINAL: Obese, soft, nontender, nondistended, positive bowel sounds all 4 quadrants. No apparent ascites.  EXTREMITIES: No bilateral pitting peripheral edema  HEMATOLOGIC:  No significant bruising NEUROLOGIC: Oriented to person, place, and time. Nonfocal. Normal muscle tone.  PSYCHIATRIC: Normal mood and affect. Normal behavior. Cooperative  RADIOLOGY: CT angio chest PE with and without contrast September 02, 2019:Cardiovascular: There is mild cardiomegaly. No pericardial effusion. There is multi vessel coronary vascular calcification. There is mild atherosclerotic calcification of the thoracic aorta.  CARDIAC DATABASE: EKG: 01/21/2020: Normal sinus rhythm, 92 bpm, normal axis, ST depressions in the high lateral and lateral leads, no evidence of myocardial injury pattern.   Echocardiogram: 01/26/2020:  Hyperdynamic LV systolic function with visual EF >70%. Intraventricular PG of 31 mm Hg detected.  Left ventricle cavity is normal in size. Moderate concentric hypertrophy of the left ventricle. Normal global wall motion. Doppler evidence of grade I (impaired) diastolic dysfunction, elevated LAP. Mild (Grade I) aortic regurgitation. Mild (Grade I) mitral regurgitation.   05/06/2020: LVEF 60-65%, hyperdynamic LVEF, moderate LVH, grade 2 diastolic impairment, elevated LVEDP, RV systolic function and size are within normal limits, trivial TR, trivial MR, mild to moderate aortic valve sclerosis without stenosis.  Stress Testing: Lexiscan/modified Bruce Tetrofosmin stress test 02/23/2020: Stress EKG showed sinus tachycardia, inferolateral T wave inversion.  SPECT images show small sized, medium intensity, mid to basal inferolateral perfusion defect with mild reversibility. Stress LVEF 82%. Low risk study.  Coronary CTA 04/26/2020: 1. Coronary calcium score of 1101. This was 89th percentile for age and sex matched control. 2. Normal coronary origin with right dominance. 3.  Minimal calcified plaque within the left main artery. Moderate to severe stenosis within the proximal/mid LAD segment to calcified plaque. Severe stenosis at the ostial segment of the 1st diagonal branch due calcified plaque. Severe stenosis within the proximal segment due to eccentric calcified plaque within the LCX. Moderate stenosis within the distal RCA due to calcified plaque. Of note, severity of the stenosis may be overestimated due to blooming artifact caused by calcified plaque. 4. CADRADS = 4. Cardiac catheterization or CT FFR is recommended. Consider symptom-guided anti-ischemic pharmacotherapy as well as risk factor modification per guideline directed care. Invasive coronary angiography recommended with revascularization per published guideline statements. 4. Study is sent for CT-FFR findings will be performed and reported    CT-FFR 04/27/2020: CT FFR analysis showed no significant stenosis.  Heart Catheterization: Left Heart Catheterization 05/08/20:  Hyperdynamic LVEF, EF 65 to 70%.  Mildly elevated LV EDP. Mild diffuse coronary artery disease with mild coronary calcification involving LAD, circumflex and dominant RCA.  LAD is very small and gives origin to a very large LAD: D1 which reaches the apex. Recommendation: Patient symptoms are related to acute diastolic heart failure with elevated LVEDP.  LABORATORY DATA: CBC Latest Ref Rng & Units 05/11/2020 05/08/2020 05/07/2020  WBC 4.0 - 10.5 K/uL 13.0(H) 12.3(H) 14.4(H)  Hemoglobin 13.0 - 17.0 g/dL 11.6(L) 12.0(L) 11.7(L)  Hematocrit 39 - 52 % 36.8(L) 35.9(L) 36.0(L)  Platelets 150 - 400 K/uL 365 324 296    CMP Latest Ref Rng & Units 06/02/2020 05/23/2020 05/11/2020  Glucose 65 - 99 mg/dL 106(H) 144(H) 103(H)  BUN 8 - 27 mg/dL 17 16 15   Creatinine 0.76 - 1.27 mg/dL 1.13 0.85 1.15  Sodium 134 - 144 mmol/L 138 141 138  Potassium 3.5 - 5.2 mmol/L 3.2(L) 3.7 4.7  Chloride 96 - 106 mmol/L 92(L) 103 104  CO2 20 - 29 mmol/L 33(H) 24 26  Calcium 8.6 - 10.2 mg/dL 9.4 9.1 9.1  Total Protein 6.5 - 8.1 g/dL - - -  Total Bilirubin 0.3 - 1.2 mg/dL - - -  Alkaline Phos 38 - 126 U/L - - -  AST 15 - 41 U/L - - -  ALT 0 - 44 U/L - - -   Hepatic Function Panel Recent Labs    02/16/20 1054 05/05/20 1243 05/06/20 0303  PROT 6.7 7.0 6.9  ALBUMIN 3.5 3.5 3.2*  AST 17 16 13*  ALT 21 15 15   ALKPHOS 94 72 73  BILITOT 0.6 0.6 1.0   External Labs: Lipid Panel 12/30/2019: total 155, Triglycerides 167, HDL 44, LDL 78  12/30/2019: A1C 5.4% Glucose Random 115 BUN 14, Creatinine, Serum 0.9  03/19/2019: PSA normal   06/05/2018: TSH 1.000  IMPRESSION:    ICD-10-CM   1. Acute heart failure with preserved ejection fraction (HFpEF) (HCC)  I50.31   2. DOE (dyspnea on exertion)  R06.00   3. Nonobstructive atherosclerosis of coronary artery  I25.10   4. Coronary artery  calcification  I25.10    I25.84   5. Mixed hyperlipidemia  E78.2   6. Former smoker  Z87.891   6. Benign hypertension  I10   8. Class 3 severe obesity due to excess calories with serious comorbidity and body mass index (BMI) of 40.0 to 44.9 in adult (HCC)  E66.01    Z68.41   9. Hypokalemia  K44.0 Basic metabolic panel    Magnesium    potassium chloride SA (KLOR-CON) 20 MEQ tablet     RECOMMENDATIONS: Bobby Blancett. is a 70 y.o. male whose past medical history and cardiac risk factors include: Nonobstructive coronary artery disease, severe coronary artery calcification 1101 AU, acute on chronic HFpEF/stage C/NYHA class III, prediabetes, hyperlipidemia, hypertension, advanced age, former smoker, obesity due to excess calories.  Acute on chronic heart failure preserved EF, stage C, NYHA class II/III:  Since last visit patient has lost about 22 pounds.   Continue torsemide to 10 mg p.o. every morning  Discontinue Zaroxolyn 2.5 mg p.o. q. MWF  Independently reviewed the results from 06/02/2020.  NT proBNP downtrending.  Start K-Dur 20 mEq p.o. twice daily  Check BMP and magnesium in 1 week to evaluate electrolytes and kidney function.  Continue Cardizem 240 mg CD p.o. daily  Continue ARB.  Blood pressure and weight scale log reviewed as the patient is currently enrolled into [facility] PCM management.  Patient is also been evaluated by pulmonary medicine is currently planning to undergo PFT as well as a sleep study. This is very clinically appropriate.  Hypokalemia: Start Kdur 100mEq po bid.   Coronary atherosclerosis due to calcified coronary lesions in the native artery without angina pectoris:  Continue aspirin.  Continue statin therapy. LDL is less than 70mg  /dL.   Educated on importance of improving modifiable cardiovascular risk factors for the prevention/progression of CAD  Benign essential hypertension:  Patient's blood pressure at today's office visit is not at  goal.  But he is enrolled into ambulatory blood pressure monitoring and BP log reviewed.   Medications reconciled.  Low salt diet recommended.   Mixed hyperlipidemia: Continue lipitor.  Obesity, due to excess calories: Body mass index is 37.37 kg/m. . I reviewed with the patient the importance of diet, regular physical activity/exercise, weight loss.   . Patient is educated on increasing physical activity gradually as tolerated.  With the goal of moderate intensity exercise for 30 minutes a day 5 days a week.  Former smoker: Educated on the importance of continued smoking cessation.  FINAL MEDICATION LIST END OF ENCOUNTER: Meds ordered this encounter  Medications  . potassium  chloride SA (KLOR-CON) 20 MEQ tablet    Sig: Take 1 tablet (20 mEq total) by mouth 2 (two) times daily.    Dispense:  60 tablet    Refill:  2    Current Outpatient Medications:  .  abiraterone acetate (ZYTIGA) 250 MG tablet, TAKE 4 TABLETS (1,000 MG TOTAL) BY MOUTH DAILY. TAKE ON AN EMPTY STOMACH, 1 HOUR BEFORE OR 2 HOURS AFTER A MEAL., Disp: 120 tablet, Rfl: 11 .  arformoterol (BROVANA) 15 MCG/2ML NEBU, Take 2 mLs (15 mcg total) by nebulization 2 (two) times daily., Disp: 120 mL, Rfl: 6 .  aspirin EC 81 MG tablet, Take 1 tablet (81 mg total) by mouth daily., Disp: 90 tablet, Rfl: 3 .  atorvastatin (LIPITOR) 80 MG tablet, Take 1 tablet (80 mg total) by mouth daily., Disp: 30 tablet, Rfl: 0 .  budesonide (PULMICORT) 0.25 MG/2ML nebulizer solution, Take 2 mLs (0.25 mg total) by nebulization in the morning and at bedtime., Disp: 60 mL, Rfl: 12 .  diltiazem (DILTIAZEM CD) 240 MG 24 hr capsule, Take 1 capsule (240 mg total) by mouth in the morning., Disp: 90 capsule, Rfl: 0 .  losartan (COZAAR) 50 MG tablet, TAKE ONE TABLET by mouth EVERY EVENING, Disp: 90 tablet, Rfl: 0 .  methocarbamol (ROBAXIN) 500 MG tablet, Take 1 tablet by mouth daily as needed for muscle spasms. , Disp: , Rfl:  .  Multiple Vitamins-Minerals  (MULTIVITAMIN WITH MINERALS) tablet, Take 1 tablet by mouth daily., Disp: , Rfl:  .  predniSONE (DELTASONE) 5 MG tablet, TAKE 1 TABLET BY MOUTH DAILY WITH BREAKFAST (Patient taking differently: Take 5 mg by mouth daily with breakfast. ), Disp: 30 tablet, Rfl: 2 .  solifenacin (VESICARE) 5 MG tablet, Take 5 mg by mouth daily., Disp: , Rfl:  .  tamsulosin (FLOMAX) 0.4 MG CAPS capsule, Take 1 capsule (0.4 mg total) by mouth daily after supper. (Patient taking differently: Take 0.4 mg by mouth at bedtime. ), Disp: 30 capsule, Rfl: 0 .  torsemide (DEMADEX) 10 MG tablet, Take 1 tablet (10 mg total) by mouth in the morning., Disp: 90 tablet, Rfl: 0 .  traMADol (ULTRAM) 50 MG tablet, Take 50 mg by mouth 2 (two) times daily as needed for moderate pain. , Disp: , Rfl:  .  potassium chloride SA (KLOR-CON) 20 MEQ tablet, Take 1 tablet (20 mEq total) by mouth 2 (two) times daily., Disp: 60 tablet, Rfl: 2  Orders Placed This Encounter  Procedures  . Basic metabolic panel  . Magnesium   --Continue cardiac medications as reconciled in final medication list. --Return in about 4 weeks (around 07/04/2020) for heart failure management.. Or sooner if needed. --Continue follow-up with your primary care physician regarding the management of your other chronic comorbid conditions.  Total time spent: 30 minutes.  Patient's questions and concerns were addressed to his satisfaction. He voices understanding of the instructions provided during this encounter.   This note was created using a voice recognition software as a result there may be grammatical errors inadvertently enclosed that do not reflect the nature of this encounter. Every attempt is made to correct such errors.  Rex Kras, [place], [facility]  Pager: [phone] Office: [phone]

## 2020-06-07 NOTE — Telephone Encounter (Signed)
Spoke with [facility] regarding prior message. I was advised to contact the [facility] office regarding Neb sol and the form that was supposed to be sent to our office that I have not received .

## 2020-06-08 ENCOUNTER — Other Ambulatory Visit: Payer: Self-pay | Admitting: Cardiology

## 2020-06-08 DIAGNOSIS — M4310 Spondylolisthesis, site unspecified: Secondary | ICD-10-CM | POA: Diagnosis not present

## 2020-06-08 DIAGNOSIS — M5136 Other intervertebral disc degeneration, lumbar region: Secondary | ICD-10-CM | POA: Diagnosis not present

## 2020-06-08 DIAGNOSIS — I5032 Chronic diastolic (congestive) heart failure: Secondary | ICD-10-CM | POA: Diagnosis not present

## 2020-06-08 DIAGNOSIS — M51369 Other intervertebral disc degeneration, lumbar region without mention of lumbar back pain or lower extremity pain: Secondary | ICD-10-CM | POA: Insufficient documentation

## 2020-06-08 DIAGNOSIS — M545 Low back pain: Secondary | ICD-10-CM | POA: Diagnosis not present

## 2020-06-08 DIAGNOSIS — R0609 Other forms of dyspnea: Secondary | ICD-10-CM

## 2020-06-08 NOTE — Telephone Encounter (Addendum)
Called and spoke to [place] with [facility] in [place]. She states the [facility] needs to verify insurance and other questions before med can be processed. Per Earnest Bailey, the pharmacy hasnt been able to get a hold of the pt. I gave Earnest Bailey the daughters number for the pharmacy to call. Per Raven, we can follow up in a day to check on update and to call their pharmacy at [phone].

## 2020-06-09 NOTE — Telephone Encounter (Signed)
LMTCB for the pt's daughter

## 2020-06-09 NOTE — Telephone Encounter (Signed)
I agree, he may cough up more sputum with the new nebulizer treatments. The foamy part is likely pulmonary edema from his heart failure which he described previously before starting on the nebs. His diuretic therapy is being managed by his cardiology team.

## 2020-06-09 NOTE — Telephone Encounter (Signed)
Spoke with daughter who states that she spoke with [facility] this morning and was able to answer all of their questions in regards to getting the [place] filled for her dad. She states that she has already bought him a month supply so that he could get started. Advised patient that since she spoke with [facility] everything should be taken care of and if she needed anything else to let us know.  Dr. Erin Fulling she did state that since he has been doing the breathing treatments he has been coughing more and coughing up more sputum and it is foamy. I advised her that coughing more and getting up more sputum is normal but she is questioning the foamy part.  Please advise

## 2020-06-09 NOTE — Telephone Encounter (Signed)
Called [facility] and spoke with pharmacist  She states that they were never able to get a hold of the pt yesterday  They called and there was no answer and no option to leave a msg I advised we will try to contact pt's daughter and have her call them  Called pt's daughter Wynelle Link and was able to [facility].

## 2020-06-12 ENCOUNTER — Telehealth: Payer: Self-pay

## 2020-06-12 ENCOUNTER — Other Ambulatory Visit: Payer: Self-pay

## 2020-06-12 ENCOUNTER — Telehealth: Payer: Self-pay | Admitting: Pulmonary Disease

## 2020-06-12 MED ORDER — ATORVASTATIN CALCIUM 40 MG PO TABS
40.0000 mg | ORAL_TABLET | Freq: Every day | ORAL | 3 refills | Status: DC
Start: 2020-06-12 — End: 2021-04-06

## 2020-06-12 NOTE — Telephone Encounter (Signed)
sent 

## 2020-06-12 NOTE — Telephone Encounter (Signed)
I spoke with the pt's daughter, Wynelle Link and notified of Dr August Albino response  She verbalized understanding  Nothing further needed

## 2020-06-12 NOTE — Telephone Encounter (Signed)
Pt's pharmacy is asking for refill on atorvastatin.  Pt is requesting 40 mg as he is cutting the 8 0mg  in half. Is this ok to send?

## 2020-06-12 NOTE — Telephone Encounter (Signed)
Done

## 2020-06-12 NOTE — Telephone Encounter (Signed)
Duplicate msg.

## 2020-06-12 NOTE — Telephone Encounter (Signed)
Called pt to review recent daily wt readings. Pt's wt was previously trending down and remained stable ~233-234 lbs. Wt on 10/1 of 234.4. Over the weekend, pt's wt gradually increased to 241.4 lbs this morning. Pt reports to be taking his torsemide 10 mg daily as directed. Continues to take losartan 25 mg and diltiazem 240 mg daily. Recently stopped Metolazone 2.5 mg TIW as directed and states that he hasnt had any since 06/05/20.   Pt denies any complains of SOB, dyspnea, orthopnea, lower extremity edema. Pt had previously lost ~20 lbs over the past month with aggressive diuresis therapy. Denies any recent increased salt intake, but did report to having a shrimp bowl over the weekend. Pt requested it to be unsalted but cant confirm if it was unsalted during the storage and preparation of the shrimp. Pt aware to limiting his salt intake to <2 gm/day  Pt also concerned about his possible decreased urinary urges over the weekend. States that his urinary frequency and output has been slightly lower, when compared to previous week. Pt w/ hx of prostate cancer maintained on Zytiga and Tamsulosin. Recent discontinuation of metolazone and pt being near his dry weight may explain the decrease urinary frequency. Pt is schedule to complete f/u lab work tomorrow. May consider further diuretic dose adjustment if appropriate based on pt's lab results. Pt aware to continue monitoring his weight daily and to notify the office if he has any complains of SOB, dyspnea, lower extremity edema associated with increased in his wt >3 lbs/day or >5 lbs/week. Pt verbalized understanding. Shared low salt and cardiac diet recommendation with pt and encourage pt to continue exercising in order to maintain his healthy weight loss and improve his cardiac health.

## 2020-06-12 NOTE — Telephone Encounter (Signed)
Received a call from patient in regards to weight gain. Patient states he weighed yesterday and he gained approx 6 pounds 240lbs total. Weight is same today.Patient also states he is drinking around 6 glasses of water daily but does not feel as though he is completely emptying his bladder. Patient would like to have a conversation with you regarding his weight again and maintaining weight. Would you like me to schedule an appt with patient or is it possible to give patient a call on the phone? Please advise. Thanks! Best number to reach is [phone] [facility])

## 2020-06-12 NOTE — Telephone Encounter (Signed)
Yes Lipitor 40mg  po qhs

## 2020-06-12 NOTE — Telephone Encounter (Signed)
Can the starred mobile number in patient's chart be removed? Number is incorrect per patient. Best contact is [phone] [facility]) Thank you!

## 2020-06-12 NOTE — Telephone Encounter (Signed)
Teny, could you look at his weight log.

## 2020-06-12 NOTE — Telephone Encounter (Signed)
Reviewed with Dr. Terri Skains. Per Dr. Brennan Bailey recommendation advised pt to take an extra Torsemide 10 mg today and to continue monitoring his daily wts. Encouraged pt to follow up with his urologist if he is still having urinary concerns. Pt verbalized understanding  c

## 2020-06-12 NOTE — Telephone Encounter (Signed)
Error

## 2020-06-13 ENCOUNTER — Other Ambulatory Visit: Payer: Self-pay

## 2020-06-13 DIAGNOSIS — E876 Hypokalemia: Secondary | ICD-10-CM

## 2020-06-13 DIAGNOSIS — R0609 Other forms of dyspnea: Secondary | ICD-10-CM

## 2020-06-13 DIAGNOSIS — R931 Abnormal findings on diagnostic imaging of heart and coronary circulation: Secondary | ICD-10-CM

## 2020-06-14 LAB — BASIC METABOLIC PANEL
BUN/Creatinine Ratio: 17 (ref 10–24)
BUN: 16 mg/dL (ref 8–27)
CO2: 28 mmol/L (ref 20–29)
Calcium: 9.3 mg/dL (ref 8.6–10.2)
Chloride: 100 mmol/L (ref 96–106)
Creatinine, Ser: 0.95 mg/dL (ref 0.76–1.27)
GFR calc Af Amer: 95 mL/min/{1.73_m2} (ref >59)
GFR calc non Af Amer: 82 mL/min/{1.73_m2} (ref >59)
Glucose: 102 mg/dL — ABNORMAL HIGH (ref 65–99)
Potassium: 3.6 mmol/L (ref 3.5–5.2)
Sodium: 142 mmol/L (ref 134–144)

## 2020-06-14 LAB — MAGNESIUM: Magnesium: 2 mg/dL (ref 1.6–2.3)

## 2020-06-15 ENCOUNTER — Inpatient Hospital Stay (HOSPITAL_BASED_OUTPATIENT_CLINIC_OR_DEPARTMENT_OTHER): Payer: Medicare HMO | Admitting: Oncology

## 2020-06-15 ENCOUNTER — Inpatient Hospital Stay: Payer: Medicare HMO

## 2020-06-15 ENCOUNTER — Other Ambulatory Visit: Payer: Self-pay

## 2020-06-15 ENCOUNTER — Inpatient Hospital Stay: Payer: Medicare HMO | Attending: Oncology

## 2020-06-15 VITALS — BP 130/65 | HR 79 | Temp 97.5°F | Resp 18 | Ht 67.0 in | Wt 245.6 lb

## 2020-06-15 DIAGNOSIS — C61 Malignant neoplasm of prostate: Secondary | ICD-10-CM

## 2020-06-15 DIAGNOSIS — Z5111 Encounter for antineoplastic chemotherapy: Secondary | ICD-10-CM | POA: Diagnosis present

## 2020-06-15 LAB — CMP (CANCER CENTER ONLY)
ALT: 16 U/L (ref 0–44)
AST: 12 U/L — ABNORMAL LOW (ref 15–41)
Albumin: 3.2 g/dL — ABNORMAL LOW (ref 3.5–5.0)
Alkaline Phosphatase: 103 U/L (ref 38–126)
Anion gap: 7 (ref 5–15)
BUN: 18 mg/dL (ref 8–23)
CO2: 31 mmol/L (ref 22–32)
Calcium: 9.7 mg/dL (ref 8.9–10.3)
Chloride: 103 mmol/L (ref 98–111)
Creatinine: 1.07 mg/dL (ref 0.61–1.24)
GFR, Estimated: >60 mL/min (ref >60)
Glucose, Bld: 124 mg/dL — ABNORMAL HIGH (ref 70–99)
Potassium: 3.6 mmol/L (ref 3.5–5.1)
Sodium: 141 mmol/L (ref 135–145)
Total Bilirubin: 0.4 mg/dL (ref 0.3–1.2)
Total Protein: 6.8 g/dL (ref 6.5–8.1)

## 2020-06-15 LAB — CBC WITH DIFFERENTIAL (CANCER CENTER ONLY)
Abs Immature Granulocytes: 0.04 10*3/uL (ref 0.00–0.07)
Basophils Absolute: 0.1 10*3/uL (ref 0.0–0.1)
Basophils Relative: 1 %
Eosinophils Absolute: 0.4 10*3/uL (ref 0.0–0.5)
Eosinophils Relative: 4 %
HCT: 36.1 % — ABNORMAL LOW (ref 39.0–52.0)
Hemoglobin: 11.2 g/dL — ABNORMAL LOW (ref 13.0–17.0)
Immature Granulocytes: 0 %
Lymphocytes Relative: 12 %
Lymphs Abs: 1.2 10*3/uL (ref 0.7–4.0)
MCH: 25.2 pg — ABNORMAL LOW (ref 26.0–34.0)
MCHC: 31 g/dL (ref 30.0–36.0)
MCV: 81.3 fL (ref 80.0–100.0)
Monocytes Absolute: 0.6 10*3/uL (ref 0.1–1.0)
Monocytes Relative: 6 %
Neutro Abs: 7.4 10*3/uL (ref 1.7–7.7)
Neutrophils Relative %: 77 %
Platelet Count: 341 10*3/uL (ref 150–400)
RBC: 4.44 MIL/uL (ref 4.22–5.81)
RDW: 15.7 % — ABNORMAL HIGH (ref 11.5–15.5)
WBC Count: 9.6 10*3/uL (ref 4.0–10.5)
nRBC: 0 % (ref 0.0–0.2)

## 2020-06-15 MED ORDER — LEUPROLIDE ACETATE (4 MONTH) 30 MG ~~LOC~~ KIT
PACK | SUBCUTANEOUS | Status: AC
  Filled 2020-06-15: qty 30

## 2020-06-15 MED ORDER — LEUPROLIDE ACETATE (4 MONTH) 30 MG ~~LOC~~ KIT
30.0000 mg | PACK | Freq: Once | SUBCUTANEOUS | Status: AC
  Administered 2020-06-15: 30 mg via SUBCUTANEOUS

## 2020-06-15 NOTE — Progress Notes (Signed)
Hematology and Oncology Follow Up Visit  Bobby [name] [id number] 03/13/1951 69 y.o. 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   Prior Therapy: He is status post prostate biopsy in August 2018.  Current therapy:  Eligard 30 mg every 4 months given on February 16, 2020.  This will be repeated today.  Zytiga 1000 mg daily with prednisone 5 mg daily started in October 2018.  Interim History: Bobby [name] presents today for a repeat evaluation.  Since the last visit, he was hospitalized in August 2021 with shortness of breath and found to have acute on chronic heart failure exacerbation.  Since his discharge, he reports improvement in his overall health and respiratory status.  He has lost weight and has not reported any lower extremity edema.  He tolerated Zytiga without any recent complications.  He denies any excessive fatigue or bone pain.  He denies any pathological fractures.      Medications: Unchanged on review. Current Outpatient Medications  Medication Sig Dispense Refill  . abiraterone acetate (ZYTIGA) 250 MG tablet TAKE 4 TABLETS (1,000 MG TOTAL) BY MOUTH DAILY. TAKE ON AN EMPTY STOMACH, 1 HOUR BEFORE OR 2 HOURS AFTER A MEAL. 120 tablet 11  . arformoterol (BROVANA) 15 MCG/2ML NEBU Take 2 mLs (15 mcg total) by nebulization 2 (two) times daily. 120 mL 6  . aspirin EC 81 MG tablet Take 1 tablet (81 mg total) by mouth daily. 90 tablet 3  . atorvastatin (LIPITOR) 40 MG tablet Take 1 tablet (40 mg total) by mouth daily. 90 tablet 3  . budesonide (PULMICORT) 0.25 MG/2ML nebulizer solution Take 2 mLs (0.25 mg total) by nebulization in the morning and at bedtime. 60 mL 12  . diltiazem (DILTIAZEM CD) 240 MG 24 hr capsule Take 1 capsule (240 mg total) by mouth in the morning. 90 capsule 0  . losartan (COZAAR) 50  MG tablet TAKE ONE TABLET by mouth EVERY EVENING 90 tablet 0  . methocarbamol (ROBAXIN) 500 MG tablet Take 1 tablet by mouth daily as needed for muscle spasms.     . Multiple Vitamins-Minerals (MULTIVITAMIN WITH MINERALS) tablet Take 1 tablet by mouth daily.    . potassium chloride SA (KLOR-CON) 20 MEQ tablet Take 1 tablet (20 mEq total) by mouth 2 (two) times daily. 60 tablet 2  . predniSONE (DELTASONE) 5 MG tablet TAKE 1 TABLET BY MOUTH DAILY WITH BREAKFAST (Patient taking differently: Take 5 mg by mouth daily with breakfast. ) 30 tablet 2  . solifenacin (VESICARE) 5 MG tablet Take 5 mg by mouth daily.    . tamsulosin (FLOMAX) 0.4 MG CAPS capsule Take 1 capsule (0.4 mg total) by mouth daily after supper. (Patient taking differently: Take 0.4 mg by mouth at bedtime. ) 30 capsule 0  . torsemide (DEMADEX) 10 MG tablet Take 1 tablet (10 mg total) by mouth in the morning. 90 tablet 0  . traMADol (ULTRAM) 50 MG tablet Take 50 mg by mouth 2 (two) times daily as needed for moderate pain.      [name] current facility-administered medications for this visit.     Allergies: [name] Known Allergies     Physical Exam:  Blood pressure 130/65, pulse 79, temperature (!) 97.5 F (36.4 C), temperature source Tympanic, resp. rate 18, height 5\' 7"  (1.702 m), weight 245 lb 9.6 oz (111.4 kg), SpO2 98 %.  ECOG: 1    General appearance: Alert, awake without any distress. Head: Atraumatic without abnormalities Oropharynx: Without any thrush or ulcers. Eyes: [name] scleral icterus. Lymph nodes: [name] lymphadenopathy noted in the cervical, supraclavicular, or axillary nodes Heart:regular rate and rhythm, without any murmurs or gallops.   Lung: Clear to auscultation without any rhonchi, wheezes or dullness to percussion. Abdomin: Soft, nontender without any shifting dullness or ascites. Musculoskeletal: [name] clubbing or cyanosis. Neurological: [name] motor or sensory deficits. Skin: [name] rashes or  lesions.             Lab Results: Lab Results  Component Value Date   WBC 13.0 (H) 05/11/2020   HGB 11.6 (L) 05/11/2020   HCT 36.8 (L) 05/11/2020   MCV 87.4 05/11/2020   PLT 365 05/11/2020     Chemistry      Component Value Date/Time   NA 142 06/13/2020 1111   NA 142 09/10/2017 1509   K 3.6 06/13/2020 1111   K 4.2 09/10/2017 1509   CL 100 06/13/2020 1111   CO2 28 06/13/2020 1111   CO2 25 09/10/2017 1509   BUN 16 06/13/2020 1111   BUN 19.4 09/10/2017 1509   CREATININE 0.95 06/13/2020 1111   CREATININE 0.96 02/16/2020 1054   CREATININE 1.2 09/10/2017 1509      Component Value Date/Time   CALCIUM 9.3 06/13/2020 1111   CALCIUM 9.7 09/10/2017 1509   ALKPHOS 73 05/06/2020 0303   ALKPHOS 84 09/10/2017 1509   AST 13 (L) 05/06/2020 0303   AST 17 02/16/2020 1054   AST 14 09/10/2017 1509   ALT 15 05/06/2020 0303   ALT 21 02/16/2020 1054   ALT 18 09/10/2017 1509   BILITOT 1.0 05/06/2020 0303   BILITOT 0.6 02/16/2020 1054   BILITOT 0.38 09/10/2017 1509       Results for Castelluccio, Yaqub BENSON JR. "Bobby [name]" (MRN [id number]) as of 06/15/2020 08:49  Ref. Range 10/12/2019 09:20 02/16/2020 10:54  Prostate Specific Ag, Serum Latest Ref Range: 0.0 - 4.0 ng/mL <0.1 <0.1         Impression and Plan:  69 year old man with:  1. Castration-sensitive prostate cancer with lymphadenopathy diagnosed in 2018.    He is currently on Zytiga with excellent PSA response that is currently undetectable.  Risks and benefits of continuing this medication long-term were reviewed.  Potential complications that include hypertension, edema, adrenal insufficiency among others.  Alternative treatment options such as systemic chemotherapy and androgen receptor antagonist were also reviewed.  After discussion today he is agreeable to continue.   2.  Androgen deprivation therapy: He will receive Eligard today and repeated in 4 months.  Complications occluding weight gain, hot flashes and  osteoporosis were reiterated.  3.  Hypertension: Blood pressure under excellent control at this time.  4.  Electrolyte and liver function test monitoring: [name] issues reported with his potassium and liver function test.  We will monitor on Zytiga.  5.  Prognosis and goals of care: Therapy remains palliative although aggressive measures are warranted given his excellent performance status..  6.  Follow-up: In 4 months for repeat follow-up and next Eligard.  30  minutes were dedicated to this encounter.  Time was spent on reviewing his disease status, discussing treatment options and future plan of care review.    Zola Button, MD 10/7/20218:47 AM

## 2020-06-15 NOTE — Patient Instructions (Signed)

## 2020-06-16 ENCOUNTER — Telehealth: Payer: Self-pay

## 2020-06-16 ENCOUNTER — Other Ambulatory Visit: Payer: Self-pay | Admitting: Cardiology

## 2020-06-16 DIAGNOSIS — I5031 Acute diastolic (congestive) heart failure: Secondary | ICD-10-CM

## 2020-06-16 LAB — PROSTATE-SPECIFIC AG, SERUM (LABCORP): Prostate Specific Ag, Serum: <0.1 ng/mL (ref 0.0–4.0)

## 2020-06-16 NOTE — Telephone Encounter (Signed)
-----   Message from Wyatt Portela, MD sent at 06/16/2020  9:09 AM EDT ----- Please let him know his PSA is still very low

## 2020-06-16 NOTE — Telephone Encounter (Signed)
Patient was giving Lab results per Dr Alen Blew. Patient verbalized understanding.

## 2020-06-19 ENCOUNTER — Telehealth: Payer: Self-pay

## 2020-06-19 MED FILL — ABIRATERONE ACETATE 250 MG: 250 | 30 days supply | Qty: 120 | Fill #1

## 2020-06-19 NOTE — Telephone Encounter (Signed)
Oral Oncology Patient Advocate Encounter  Met patient in lobby to complete application for [name] and [place] in an effort to reduce patient's out of pocket expense for Zytiga to $0.    Application completed and faxed to [phone].   [employer] patient assistance phone number for follow up is [phone].   This encounter will be updated until final determination.  [place] Patient [place] Phone [phone] Fax [phone] 06/19/2020 1:35 PM

## 2020-06-26 ENCOUNTER — Telehealth: Payer: Self-pay | Admitting: Pulmonary Disease

## 2020-06-26 NOTE — Telephone Encounter (Signed)
Called patient back. He is feeling about the same as when we first met in clinic. His diuretic therapy was recently increased by his cardiology team. I still feel that the foamy sputum he is producing is related to heart failure. I instructed him to keep the scheduled PFTs and HST machine pickup for 10/21 as I can tailor his nebulizer regimen based on the PFTs and getting him setup for CPAP or BiPAP therapy which would help his heart failure and further diuresis.  Discussed checking lab work for d-dimer in regards to possible DVT/PE. He had negative CTA chest in August. Patient expressed understanding. He would like to hold off on further workup for this at this time.   Nothing further is needed.

## 2020-06-26 NOTE — Telephone Encounter (Signed)
Spoke with the pt  He is c/o pain in his chest with taking a deep breath for the past wk  He states also painful to cough, but denies any increase in the frequency of cough or sputum production  When he does cough he produces some white, foamy sputum  He reports that his SOB is unchanged since the last visit here, not any worse and no wheezing  He denies any f/c/s, body aches  He is taking his brovana and budesonide nebs BID and rarely uses the albuterol neb Pt is scheduled for PFT and to pick up HST machine on 06/29/20  He is requesting recs  Has been fully vaccinated against covid  Please advise, thanks!

## 2020-06-27 DIAGNOSIS — R69 Illness, unspecified: Secondary | ICD-10-CM | POA: Diagnosis not present

## 2020-06-29 ENCOUNTER — Ambulatory Visit (INDEPENDENT_AMBULATORY_CARE_PROVIDER_SITE_OTHER): Payer: Medicare HMO | Admitting: Pulmonary Disease

## 2020-06-29 ENCOUNTER — Ambulatory Visit: Payer: Medicare HMO

## 2020-06-29 ENCOUNTER — Other Ambulatory Visit: Payer: Self-pay

## 2020-06-29 DIAGNOSIS — R0602 Shortness of breath: Secondary | ICD-10-CM

## 2020-06-29 DIAGNOSIS — G4730 Sleep apnea, unspecified: Secondary | ICD-10-CM

## 2020-06-29 DIAGNOSIS — G4733 Obstructive sleep apnea (adult) (pediatric): Secondary | ICD-10-CM | POA: Diagnosis not present

## 2020-06-29 LAB — PULMONARY FUNCTION TEST
DL/VA % pred: 120 %
DL/VA: 4.99 ml/min/mmHg/L
DLCO cor % pred: 95 %
DLCO cor: 22.15 ml/min/mmHg
DLCO unc % pred: 85 %
DLCO unc: 19.77 ml/min/mmHg
FEF 25-75 Post: 1.93 L/sec
FEF 25-75 Pre: 1.7 L/sec
FEF2575-%Change-Post: 13 %
FEF2575-%Pred-Post: 87 %
FEF2575-%Pred-Pre: 76 %
FEV1-%Change-Post: 3 %
FEV1-%Pred-Post: 82 %
FEV1-%Pred-Pre: 79 %
FEV1-Post: 2.32 L
FEV1-Pre: 2.25 L
FEV1FVC-%Change-Post: 2 %
FEV1FVC-%Pred-Pre: 101 %
FEV6-%Change-Post: 1 %
FEV6-%Pred-Post: 83 %
FEV6-%Pred-Pre: 82 %
FEV6-Post: 3.01 L
FEV6-Pre: 2.97 L
FEV6FVC-%Change-Post: 0 %
FEV6FVC-%Pred-Post: 106 %
FEV6FVC-%Pred-Pre: 106 %
FVC-%Change-Post: 0 %
FVC-%Pred-Post: 78 %
FVC-%Pred-Pre: 77 %
FVC-Post: 3.01 L
FVC-Pre: 2.98 L
Post FEV1/FVC ratio: 77 %
Post FEV6/FVC ratio: 100 %
Pre FEV1/FVC ratio: 75 %
Pre FEV6/FVC Ratio: 100 %
RV % pred: 76 %
RV: 1.67 L
TLC % pred: 77 %
TLC: 4.8 L

## 2020-06-29 NOTE — Progress Notes (Signed)
PFT done today. 

## 2020-07-03 DIAGNOSIS — C61 Malignant neoplasm of prostate: Secondary | ICD-10-CM | POA: Diagnosis not present

## 2020-07-03 DIAGNOSIS — R69 Illness, unspecified: Secondary | ICD-10-CM | POA: Diagnosis not present

## 2020-07-03 DIAGNOSIS — I130 Hypertensive heart and chronic kidney disease with heart failure and stage 1 through stage 4 chronic kidney disease, or unspecified chronic kidney disease: Secondary | ICD-10-CM | POA: Diagnosis not present

## 2020-07-03 DIAGNOSIS — N1831 Chronic kidney disease, stage 3a: Secondary | ICD-10-CM | POA: Diagnosis not present

## 2020-07-03 DIAGNOSIS — I10 Essential (primary) hypertension: Secondary | ICD-10-CM | POA: Diagnosis not present

## 2020-07-03 DIAGNOSIS — R7309 Other abnormal glucose: Secondary | ICD-10-CM | POA: Diagnosis not present

## 2020-07-03 DIAGNOSIS — I5033 Acute on chronic diastolic (congestive) heart failure: Secondary | ICD-10-CM | POA: Diagnosis not present

## 2020-07-03 DIAGNOSIS — I2510 Atherosclerotic heart disease of native coronary artery without angina pectoris: Secondary | ICD-10-CM | POA: Diagnosis not present

## 2020-07-03 DIAGNOSIS — E785 Hyperlipidemia, unspecified: Secondary | ICD-10-CM | POA: Diagnosis not present

## 2020-07-03 DIAGNOSIS — I700 Atherosclerosis of aorta: Secondary | ICD-10-CM | POA: Diagnosis not present

## 2020-07-03 DIAGNOSIS — E669 Obesity, unspecified: Secondary | ICD-10-CM | POA: Diagnosis not present

## 2020-07-04 ENCOUNTER — Ambulatory Visit: Payer: Medicare HMO | Admitting: Cardiology

## 2020-07-04 ENCOUNTER — Other Ambulatory Visit: Payer: Self-pay

## 2020-07-04 ENCOUNTER — Encounter: Payer: Self-pay | Admitting: Cardiology

## 2020-07-04 VITALS — BP 129/61 | HR 84 | Resp 16 | Ht 67.0 in | Wt 246.0 lb

## 2020-07-04 DIAGNOSIS — Z87891 Personal history of nicotine dependence: Secondary | ICD-10-CM | POA: Diagnosis not present

## 2020-07-04 DIAGNOSIS — Z6838 Body mass index (BMI) 38.0-38.9, adult: Secondary | ICD-10-CM | POA: Diagnosis not present

## 2020-07-04 DIAGNOSIS — E782 Mixed hyperlipidemia: Secondary | ICD-10-CM

## 2020-07-04 DIAGNOSIS — R0600 Dyspnea, unspecified: Secondary | ICD-10-CM

## 2020-07-04 DIAGNOSIS — R0609 Other forms of dyspnea: Secondary | ICD-10-CM

## 2020-07-04 DIAGNOSIS — G4733 Obstructive sleep apnea (adult) (pediatric): Secondary | ICD-10-CM | POA: Diagnosis not present

## 2020-07-04 DIAGNOSIS — I10 Essential (primary) hypertension: Secondary | ICD-10-CM

## 2020-07-04 DIAGNOSIS — I2584 Coronary atherosclerosis due to calcified coronary lesion: Secondary | ICD-10-CM | POA: Diagnosis not present

## 2020-07-04 DIAGNOSIS — I2510 Atherosclerotic heart disease of native coronary artery without angina pectoris: Secondary | ICD-10-CM | POA: Diagnosis not present

## 2020-07-04 DIAGNOSIS — I5032 Chronic diastolic (congestive) heart failure: Secondary | ICD-10-CM | POA: Diagnosis not present

## 2020-07-04 MED ORDER — DILTIAZEM HCL ER COATED BEADS 360 MG PO CP24: 360.0000 mg | ORAL_CAPSULE | Freq: Every day | ORAL | 0 refills | Status: DC

## 2020-07-04 MED ORDER — TORSEMIDE 20 MG PO TABS: 20.0000 mg | ORAL_TABLET | Freq: Every morning | ORAL | 0 refills | Status: DC

## 2020-07-04 NOTE — Progress Notes (Signed)
ID:  Bobby Forster., DOB 1951-07-02, MRN [id number]  PCP:  Bobby Bowen, MD  Cardiologist: Bobby Kras, DO, [facility] (established care 01/21/2020)  Date: 07/04/2020 Last Office Visit: 06/06/2020  Chief Complaint  Patient presents with  . Congestive Heart Failure  . Follow-up    4 week    Bobby [name]. is a 69 y.o. male who presents to the office with a  chief complaint of "1 month followup for shortness of breath and heart failure management."  His past medical history and cardiovascular risk factors are: Nonobstructive coronary artery disease, severe coronary artery calcification 1101 AU, acute on chronic HFpEF/stage C/NYHA class II, prediabetes, hyperlipidemia, hypertension, advanced age, former smoker, obesity due to excess calories.  Patient is accompanied today by his wife Bobby [name].   Patient was originally referred to the office for evaluation of  coronary artery calcification.   Given the coronary artery calcification and dyspnea on exertion patient underwent an ischemic evaluation including a coronary CTA results and thereafter underwent left heart catheterization during one of his subsequent hospitalizations results noted below.    Patient recently diagnosed with heart failure with preserved EF and his medications have been titrated. Since then he has diuresed well and since last office visit his weight remains stable.  At the last office visit medications were titrated and he has tolerated them well.  Patient states that his shortness of breath is significantly improved but still noticeable with effort related activities.  Since last office visit he is also followed up with pulmonary medicine and has undergone PFTs as well as sleep study results are pending.    Since last visit we also increase his torsemide in the interim due to weight gain.  Repeat blood work from June 15, 2020 reviewed.  No family history of premature coronary disease or sudden cardiac  death.  Denies prior history of myocardial infarction, deep venous thrombosis, pulmonary embolism, stroke, transient ischemic attack.  FUNCTIONAL STATUS: Limited due to back pain.    ALLERGIES: No Known Allergies  MEDICATION LIST PRIOR TO VISIT: Current Meds  Medication Sig  . abiraterone acetate (ZYTIGA) 250 MG tablet TAKE 4 TABLETS (1,000 MG TOTAL) BY MOUTH DAILY. TAKE ON AN EMPTY STOMACH, 1 HOUR BEFORE OR 2 HOURS AFTER A MEAL.  Marland Kitchen arformoterol (BROVANA) 15 MCG/2ML NEBU Take 2 mLs (15 mcg total) by nebulization 2 (two) times daily.  Marland Kitchen aspirin EC 81 MG tablet Take 1 tablet (81 mg total) by mouth daily.  Marland Kitchen atorvastatin (LIPITOR) 40 MG tablet Take 1 tablet (40 mg total) by mouth daily.  . budesonide (PULMICORT) 0.25 MG/2ML nebulizer solution Take 2 mLs (0.25 mg total) by nebulization in the morning and at bedtime.  Marland Kitchen losartan (COZAAR) 50 MG tablet TAKE ONE TABLET by mouth EVERY EVENING  . methocarbamol (ROBAXIN) 500 MG tablet Take 1 tablet by mouth daily as needed for muscle spasms.   . Multiple Vitamins-Minerals (MULTIVITAMIN WITH MINERALS) tablet Take 1 tablet by mouth daily.  . potassium chloride SA (KLOR-CON) 20 MEQ tablet Take 1 tablet (20 mEq total) by mouth 2 (two) times daily.  . predniSONE (DELTASONE) 5 MG tablet TAKE 1 TABLET BY MOUTH DAILY WITH BREAKFAST (Patient taking differently: Take 5 mg by mouth daily with breakfast. )  . solifenacin (VESICARE) 5 MG tablet Take 5 mg by mouth daily.  . tamsulosin (FLOMAX) 0.4 MG CAPS capsule Take 1 capsule (0.4 mg total) by mouth daily after supper. (Patient taking differently: Take 0.4 mg by mouth at  bedtime. )  . torsemide (DEMADEX) 20 MG tablet Take 1 tablet (20 mg total) by mouth in the morning.  . traMADol (ULTRAM) 50 MG tablet Take 50 mg by mouth 2 (two) times daily as needed for moderate pain.   . [DISCONTINUED] diltiazem (CARDIZEM CD) 240 MG 24 hr capsule Take 1 capsule (240 mg total) by mouth daily.  . [DISCONTINUED] torsemide  (DEMADEX) 10 MG tablet Take 1 tablet (10 mg total) by mouth in the morning.     PAST MEDICAL HISTORY: Past Medical History:  Diagnosis Date  . Bronchitis   . CHF (congestive heart failure) ([place]) 04/2020   A/C HFpEF  . Coronary artery calcification of native artery   . Family history of pancreatic cancer   . History of kidney stones   . Hyperlipidemia   . Hypertension   . Pneumonia   . Prostate cancer ([place])     PAST SURGICAL HISTORY: Past Surgical History:  Procedure Laterality Date  . EXTRACORPOREAL SHOCK WAVE LITHOTRIPSY Right 04/17/2017   Procedure: RIGHT EXTRACORPOREAL SHOCK WAVE LITHOTRIPSY (ESWL);  Surgeon: Bobby Rhodes, MD;  Location: [facility];  Service: Urology;  Laterality: Right;  . HERNIA REPAIR     age 53  . LEFT HEART CATH AND CORONARY ANGIOGRAPHY N/A 05/08/2020   Procedure: LEFT HEART CATH AND CORONARY ANGIOGRAPHY;  Surgeon: Bobby Prows, MD;  Location: [facility];  Service: Cardiovascular;  Laterality: N/A;  . PROSTATE BIOPSY      FAMILY HISTORY: The patient family history includes Breast cancer in his maternal aunt; Breast cancer (age of onset: 70) in his sister; Kidney cancer in his maternal aunt; Pancreatic cancer (age of onset: 79) in his brother.  SOCIAL HISTORY:  The patient  reports that he quit smoking about 8 years ago. His smoking use included cigarettes. He has a 2.50 pack-year smoking history. He has never used smokeless tobacco. He reports that he does not drink alcohol and does not use drugs.  REVIEW OF SYSTEMS: Review of Systems  Constitutional: Positive for malaise/fatigue and weight loss. Negative for chills and fever.  HENT: Negative for hoarse voice and nosebleeds.   Eyes: Negative for discharge, double vision and pain.  Cardiovascular: Positive for dyspnea on exertion (improving). Negative for chest pain, claudication, leg swelling, near-syncope, orthopnea, palpitations, paroxysmal nocturnal dyspnea and syncope.  Respiratory: Positive for  shortness of breath (improving). Negative for hemoptysis.        Daytime sleepiness  Musculoskeletal: Negative for muscle cramps and myalgias.  Gastrointestinal: Negative for abdominal pain, constipation, diarrhea, hematemesis, hematochezia, melena, nausea and vomiting.  Neurological: Negative for dizziness and light-headedness.   PHYSICAL EXAM: Vitals with BMI 07/04/2020 06/15/2020 06/06/2020  Height 5\' 7"  5\' 7"  5\' 7"   Weight 246 lbs 245 lbs 10 oz 238 lbs 10 oz  BMI 38.52 67.89 38.10  Systolic 175 102 585  Diastolic 61 65 67  Pulse 84 79 91    CONSTITUTIONAL: Well-developed and well-nourished. No acute distress.  SKIN: Skin is warm and dry. No rash noted. No cyanosis. No pallor. No jaundice HEAD: Normocephalic and atraumatic.  EYES: No scleral icterus MOUTH/THROAT: Moist oral membranes.  NECK: No JVD present. No thyromegaly noted. No carotid bruits  LYMPHATIC: No visible cervical adenopathy.  CHEST Normal respiratory effort. No intercostal retractions  LUNGS: Clear to auscultation bilaterally. No stridor. No wheezes. No rales.  CARDIOVASCULAR: Regular rate and rhythm, positive S1-S2, no murmurs rubs or gallops appreciated. ABDOMINAL: Obese, soft, nontender, nondistended, positive bowel sounds all 4 quadrants. No  apparent ascites.  EXTREMITIES: No bilateral pitting peripheral edema  HEMATOLOGIC: No significant bruising NEUROLOGIC: Oriented to person, place, and time. Nonfocal. Normal muscle tone.  PSYCHIATRIC: Normal mood and affect. Normal behavior. Cooperative  RADIOLOGY: CT angio chest PE with and without contrast September 02, 2019:Cardiovascular: There is mild cardiomegaly. No pericardial effusion. There is multi vessel coronary vascular calcification. There is mild atherosclerotic calcification of the thoracic aorta.  CARDIAC DATABASE: EKG: 01/21/2020: Normal sinus rhythm, 92 bpm, normal axis, ST depressions in the high lateral and lateral leads, no evidence of myocardial  injury pattern.   Echocardiogram: 01/26/2020: Hyperdynamic LV systolic function with visual EF >70%. Intraventricular PG of 31 mm Hg detected.  Left ventricle cavity is normal in size. Moderate concentric hypertrophy of the left ventricle. Normal global wall motion. Doppler evidence of grade I (impaired) diastolic dysfunction, elevated LAP. Mild (Grade I) aortic regurgitation. Mild (Grade I) mitral regurgitation.   05/06/2020: LVEF 60-65%, hyperdynamic LVEF, moderate LVH, grade 2 diastolic impairment, elevated LVEDP, RV systolic function and size are within normal limits, trivial TR, trivial MR, mild to moderate aortic valve sclerosis without stenosis.  Stress Testing: Lexiscan/modified Bruce Tetrofosmin stress test 02/23/2020: Stress EKG showed sinus tachycardia, inferolateral T wave inversion.  SPECT images show small sized, medium intensity, mid to basal inferolateral perfusion defect with mild reversibility. Stress LVEF 82%. Low risk study.  Coronary CTA 04/26/2020: 1. Coronary calcium score of 1101. This was 89th percentile for age and sex matched control. 2. Normal coronary origin with right dominance. 3.  Minimal calcified plaque within the left main artery. Moderate to severe stenosis within the proximal/mid LAD segment to calcified plaque. Severe stenosis at the ostial segment of the 1st diagonal branch due calcified plaque. Severe stenosis within the proximal segment due to eccentric calcified plaque within the LCX. Moderate stenosis within the distal RCA due to calcified plaque. Of note, severity of the stenosis may be overestimated due to blooming artifact caused by calcified plaque. 4. CADRADS = 4. Cardiac catheterization or CT FFR is recommended. Consider symptom-guided anti-ischemic pharmacotherapy as well as risk factor modification per guideline directed care. Invasive coronary angiography recommended with revascularization per published guideline statements. 4. Study is sent  for CT-FFR findings will be performed and reported   CT-FFR 04/27/2020: CT FFR analysis showed no significant stenosis.  Heart Catheterization: Left Heart Catheterization 05/08/20:  Hyperdynamic LVEF, EF 65 to 70%.  Mildly elevated LV EDP. Mild diffuse coronary artery disease with mild coronary calcification involving LAD, circumflex and dominant RCA.  LAD is very small and gives origin to a very large LAD: D1 which reaches the apex. Recommendation: Patient symptoms are related to acute diastolic heart failure with elevated LVEDP.  LABORATORY DATA: CBC Latest Ref Rng & Units 06/15/2020 05/11/2020 05/08/2020  WBC 4.0 - 10.5 K/uL 9.6 13.0(H) 12.3(H)  Hemoglobin 13.0 - 17.0 g/dL 11.2(L) 11.6(L) 12.0(L)  Hematocrit 39 - 52 % 36.1(L) 36.8(L) 35.9(L)  Platelets 150 - 400 K/uL 341 365 324    CMP Latest Ref Rng & Units 06/15/2020 06/13/2020 06/02/2020  Glucose 70 - 99 mg/dL 124(H) 102(H) 106(H)  BUN 8 - 23 mg/dL 18 16 17   Creatinine 0.61 - 1.24 mg/dL 1.07 0.95 1.13  Sodium 135 - 145 mmol/L 141 142 138  Potassium 3.5 - 5.1 mmol/L 3.6 3.6 3.2(L)  Chloride 98 - 111 mmol/L 103 100 92(L)  CO2 22 - 32 mmol/L 31 28 33(H)  Calcium 8.9 - 10.3 mg/dL 9.7 9.3 9.4  Total Protein 6.5 - 8.1 g/dL  6.8 - -  Total Bilirubin 0.3 - 1.2 mg/dL 0.4 - -  Alkaline Phos 38 - 126 U/L 103 - -  AST 15 - 41 U/L 12(L) - -  ALT 0 - 44 U/L 16 - -   Hepatic Function Panel Recent Labs    05/05/20 1243 05/06/20 0303 06/15/20 0834  PROT 7.0 6.9 6.8  ALBUMIN 3.5 3.2* 3.2*  AST 16 13* 12*  ALT 15 15 16   ALKPHOS 72 73 103  BILITOT 0.6 1.0 0.4   External Labs: Lipid Panel 12/30/2019: total 155, Triglycerides 167, HDL 44, LDL 78  12/30/2019: A1C 5.4% Glucose Random 115 BUN 14, Creatinine, Serum 0.9  03/19/2019: PSA normal   06/05/2018: TSH 1.000  IMPRESSION:    ICD-10-CM   1. Chronic heart failure with preserved ejection fraction (HFpEF) (HCC)  I50.32 torsemide (DEMADEX) 20 MG tablet    diltiazem (CARDIZEM CD)  360 MG 24 hr capsule  2. DOE (dyspnea on exertion)  R06.00 torsemide (DEMADEX) 20 MG tablet    diltiazem (CARDIZEM CD) 360 MG 24 hr capsule  3. Nonobstructive atherosclerosis of coronary artery  I25.10   4. Coronary artery calcification  I25.10    I25.84   5. Mixed hyperlipidemia  E78.2   6. Former smoker  Z87.891   5. Benign hypertension  I10   8. Class 2 severe obesity due to excess calories with serious comorbidity and body mass index (BMI) of 38.0 to 38.9 in adult [facility])  E66.01    Z68.38      RECOMMENDATIONS: Bobby [name]. is a 69 y.o. male whose past medical history and cardiac risk factors include: Nonobstructive coronary artery disease, severe coronary artery calcification 1101 AU, chronic HFpEF/stage C/NYHA class II, prediabetes, hyperlipidemia, hypertension, advanced age, former smoker, obesity due to excess calories.  Chronic heart failure preserved EF, stage C, NYHA class II:  Weight has remained stable since last office visit.  Continue torsemide to 20 mg p.o. every morning.   Independently reviewed the results from 06/15/2020.  Increase Cardizem to 320 mg CD p.o. daily  Continue ARB.  Blood pressure and weight scale log reviewed as the patient is currently enrolled into [facility] management.  Results from PFT as well as a sleep study, pending.   Hypokalemia: Continue Kdur 70mEq po bid.   Coronary atherosclerosis due to calcified coronary lesions in the native artery without angina pectoris:  Continue aspirin.  Continue statin therapy. LDL goal is less than 70mg  /dL.   Educated on importance of improving modifiable cardiovascular risk factors for the prevention/progression of CAD  Benign essential hypertension:  Patient's blood pressure at today's office visit is at goal.  But he is enrolled into ambulatory blood pressure monitoring and BP log reviewed.   Medications reconciled.  Low salt diet recommended.   Mixed hyperlipidemia: Continue  lipitor.  Does not endorse any myalgias.  Goal LDL is less than 70 mg/dL.  Obesity, due to excess calories: Body mass index is 38.53 kg/m. . I reviewed with the patient the importance of diet, regular physical activity/exercise, weight loss.   . Patient is educated on increasing physical activity gradually as tolerated.  With the goal of moderate intensity exercise for 30 minutes a day 5 days a week.  Former smoker: Educated on the importance of continued smoking cessation.  FINAL MEDICATION LIST END OF ENCOUNTER: Meds ordered this encounter  Medications  . torsemide (DEMADEX) 20 MG tablet    Sig: Take 1 tablet (20 mg total) by mouth  in the morning.    Dispense:  90 tablet    Refill:  0  . diltiazem (CARDIZEM CD) 360 MG 24 hr capsule    Sig: Take 1 capsule (360 mg total) by mouth daily.    Dispense:  90 capsule    Refill:  0    Current Outpatient Medications:  .  abiraterone acetate (ZYTIGA) 250 MG tablet, TAKE 4 TABLETS (1,000 MG TOTAL) BY MOUTH DAILY. TAKE ON AN EMPTY STOMACH, 1 HOUR BEFORE OR 2 HOURS AFTER A MEAL., Disp: 120 tablet, Rfl: 11 .  arformoterol (BROVANA) 15 MCG/2ML NEBU, Take 2 mLs (15 mcg total) by nebulization 2 (two) times daily., Disp: 120 mL, Rfl: 6 .  aspirin EC 81 MG tablet, Take 1 tablet (81 mg total) by mouth daily., Disp: 90 tablet, Rfl: 3 .  atorvastatin (LIPITOR) 40 MG tablet, Take 1 tablet (40 mg total) by mouth daily., Disp: 90 tablet, Rfl: 3 .  budesonide (PULMICORT) 0.25 MG/2ML nebulizer solution, Take 2 mLs (0.25 mg total) by nebulization in the morning and at bedtime., Disp: 60 mL, Rfl: 12 .  losartan (COZAAR) 50 MG tablet, TAKE ONE TABLET by mouth EVERY EVENING, Disp: 90 tablet, Rfl: 0 .  methocarbamol (ROBAXIN) 500 MG tablet, Take 1 tablet by mouth daily as needed for muscle spasms. , Disp: , Rfl:  .  Multiple Vitamins-Minerals (MULTIVITAMIN WITH MINERALS) tablet, Take 1 tablet by mouth daily., Disp: , Rfl:  .  potassium chloride SA (KLOR-CON) 20 MEQ  tablet, Take 1 tablet (20 mEq total) by mouth 2 (two) times daily., Disp: 60 tablet, Rfl: 2 .  predniSONE (DELTASONE) 5 MG tablet, TAKE 1 TABLET BY MOUTH DAILY WITH BREAKFAST (Patient taking differently: Take 5 mg by mouth daily with breakfast. ), Disp: 30 tablet, Rfl: 2 .  solifenacin (VESICARE) 5 MG tablet, Take 5 mg by mouth daily., Disp: , Rfl:  .  tamsulosin (FLOMAX) 0.4 MG CAPS capsule, Take 1 capsule (0.4 mg total) by mouth daily after supper. (Patient taking differently: Take 0.4 mg by mouth at bedtime. ), Disp: 30 capsule, Rfl: 0 .  torsemide (DEMADEX) 20 MG tablet, Take 1 tablet (20 mg total) by mouth in the morning., Disp: 90 tablet, Rfl: 0 .  traMADol (ULTRAM) 50 MG tablet, Take 50 mg by mouth 2 (two) times daily as needed for moderate pain. , Disp: , Rfl:  .  diltiazem (CARDIZEM CD) 360 MG 24 hr capsule, Take 1 capsule (360 mg total) by mouth daily., Disp: 90 capsule, Rfl: 0  No orders of the defined types were placed in this encounter.  --Continue cardiac medications as reconciled in final medication list. --Return in about 3 months (around 10/04/2020) for heart failure management.. Or sooner if needed. --Continue follow-up with your primary care physician regarding the management of your other chronic comorbid conditions.  Total time spent: 30 minutes.  Patient's questions and concerns were addressed to his satisfaction. He voices understanding of the instructions provided during this encounter.   This note was created using a voice recognition software as a result there may be grammatical errors inadvertently enclosed that do not reflect the nature of this encounter. Every attempt is made to correct such errors.  Bobby [name], [place], [facility]  Pager: [phone] Office: [phone]

## 2020-07-05 ENCOUNTER — Telehealth: Payer: Self-pay | Admitting: Pulmonary Disease

## 2020-07-05 DIAGNOSIS — G4733 Obstructive sleep apnea (adult) (pediatric): Secondary | ICD-10-CM

## 2020-07-05 NOTE — Telephone Encounter (Signed)
Please let patient know that he has moderate obstructive sleep apnea and would benefit from CPAP therapy. Please refer patient to one of our sleep specialists for further management. Please order CPAP machine with auto-titration 5-15cmH2O and mask fitting.   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   Thanks, Freda Jackson, MD [facility] Office: [phone]   See Amion for Pager Details

## 2020-07-05 NOTE — Telephone Encounter (Signed)
Spoke with pt and reviewed Dr. August Albino recommendations and results. Pt stated understanding. CPAP order was placed. Consult with Dr. Ander Slade was scheduled on 08/08/2020 at 10:30am. Nothing further needed at this time.

## 2020-07-09 DIAGNOSIS — I5032 Chronic diastolic (congestive) heart failure: Secondary | ICD-10-CM | POA: Diagnosis not present

## 2020-07-13 ENCOUNTER — Other Ambulatory Visit: Payer: Self-pay | Admitting: Cardiology

## 2020-07-13 DIAGNOSIS — R0600 Dyspnea, unspecified: Secondary | ICD-10-CM

## 2020-07-13 DIAGNOSIS — R0609 Other forms of dyspnea: Secondary | ICD-10-CM

## 2020-07-13 MED FILL — ABIRATERONE ACETATE 250 MG: 250 | 30 days supply | Qty: 120 | Fill #2

## 2020-07-17 ENCOUNTER — Other Ambulatory Visit: Payer: Self-pay

## 2020-07-17 DIAGNOSIS — R0609 Other forms of dyspnea: Secondary | ICD-10-CM

## 2020-07-17 DIAGNOSIS — I5032 Chronic diastolic (congestive) heart failure: Secondary | ICD-10-CM

## 2020-07-17 DIAGNOSIS — R69 Illness, unspecified: Secondary | ICD-10-CM | POA: Diagnosis not present

## 2020-07-17 DIAGNOSIS — R0600 Dyspnea, unspecified: Secondary | ICD-10-CM

## 2020-07-17 MED ORDER — TORSEMIDE 20 MG PO TABS: 20.0000 mg | ORAL_TABLET | Freq: Every morning | ORAL | 0 refills | Status: DC

## 2020-07-18 ENCOUNTER — Telehealth: Payer: Self-pay | Admitting: Pulmonary Disease

## 2020-07-18 NOTE — Telephone Encounter (Signed)
Patient is temporarily approved for Zytiga at no charge from [employer] 07/17/20-09/15/20.  Patient needs to sign Medicare Attestation form and send back to [employer] by 09/08/20

## 2020-07-18 NOTE — Telephone Encounter (Signed)
Spoke with [place] and advised ok for #120 ml budesonide so this makes 1 month supply  Nothing further needed

## 2020-07-26 ENCOUNTER — Other Ambulatory Visit: Payer: Self-pay | Admitting: Cardiology

## 2020-07-26 DIAGNOSIS — G4733 Obstructive sleep apnea (adult) (pediatric): Secondary | ICD-10-CM | POA: Diagnosis not present

## 2020-07-26 DIAGNOSIS — I10 Essential (primary) hypertension: Secondary | ICD-10-CM

## 2020-07-28 DIAGNOSIS — R69 Illness, unspecified: Secondary | ICD-10-CM | POA: Diagnosis not present

## 2020-08-01 ENCOUNTER — Ambulatory Visit: Payer: Medicare HMO | Admitting: Pulmonary Disease

## 2020-08-01 ENCOUNTER — Encounter: Payer: Self-pay | Admitting: Pulmonary Disease

## 2020-08-01 ENCOUNTER — Other Ambulatory Visit: Payer: Self-pay

## 2020-08-01 ENCOUNTER — Telehealth: Payer: Self-pay

## 2020-08-01 VITALS — BP 124/68 | HR 82 | Ht 67.0 in | Wt 235.0 lb

## 2020-08-01 DIAGNOSIS — E669 Obesity, unspecified: Secondary | ICD-10-CM | POA: Diagnosis not present

## 2020-08-01 DIAGNOSIS — G4733 Obstructive sleep apnea (adult) (pediatric): Secondary | ICD-10-CM | POA: Diagnosis not present

## 2020-08-01 DIAGNOSIS — J45909 Unspecified asthma, uncomplicated: Secondary | ICD-10-CM | POA: Diagnosis not present

## 2020-08-01 MED ORDER — BREO ELLIPTA 200-25 MCG/INH IN AEPB: 1.0000 | INHALATION_SPRAY | Freq: Every day | RESPIRATORY_TRACT | 0 refills | Status: DC

## 2020-08-01 NOTE — Patient Instructions (Signed)
Start breo ellipta once daily Stop budesonide and brovana nebulizer treatments Use albuterol as needed  Call if [facility] is adequate for your symptoms and we will send in prescription.   Follow up in 4 months

## 2020-08-01 NOTE — Progress Notes (Signed)
Synopsis: Return for shortness of breath  Subjective:   PATIENT ID: Bobby [name]. GENDER: male DOB: [birth year], MRN: [id number]   HPI  Chief Complaint  Patient presents with  . Follow-up   [name] is a 69 year old male, former smoker with heart failure preserved EF, atrial fibrillation, hypertension and hyperlipidemia who returns to pulmonary clinic for evaluation of shortness of breath.   He was last seen 05/30/20 in clinic and reports significant improvement in his breathing since then. He was started on brovana and budesonide nebulizer treatments along with as needed albuterol. He also had sleep study performed which showed an AHI of 16.4/hr and has started on CPAP therapy with auto-titration between 5-15cmH2O. Print out today shows he has required a pressure around 9.5cmH2O with an AHI 1.1. He has been trying to wear the mask for at least 4 hours a night. His toresemide was also increased to 20mg  daily since last visit.   The budesonide and brovana nebulizer treatments cost around $1,300 per month.   Past Medical History:  Diagnosis Date  . Bronchitis   . CHF (congestive heart failure) ([place]) 04/2020   A/C HFpEF  . Coronary artery calcification of native artery   . Family history of pancreatic cancer   . History of kidney stones   . Hyperlipidemia   . Hypertension   . Pneumonia   . Prostate cancer [facility])      Family History  Problem Relation Age of Onset  . Breast cancer Sister 21       "negative genetic testing"  . Pancreatic cancer Brother 24  . Breast cancer Maternal Aunt   . Kidney cancer Maternal Aunt        dx 30s     Social History   Socioeconomic History  . Marital status: Married    Spouse name: Not on file  . Number of children: 2  . Years of education: Not on file  . Highest education level: Not on file  Occupational History  . Not on file  Tobacco Use  . Smoking status: Former Smoker    Packs/day: 0.25    Years: 10.00    Pack  years: 2.50    Types: Cigarettes    Quit date: 01/20/2012    Years since quitting: 8.5  . Smokeless tobacco: Never Used  Vaping Use  . Vaping Use: Never used  Substance and Sexual Activity  . Alcohol use: No  . Drug use: No  . Sexual activity: Yes  Other Topics Concern  . Not on file  Social History Narrative  . Not on file   Social Determinants of Health   Financial Resource Strain:   . Difficulty of Paying Living Expenses: Not on file  Food Insecurity:   . Worried About Charity fundraiser in the Last Year: Not on file  . Ran Out of Food in the Last Year: Not on file  Transportation Needs:   . Lack of Transportation (Medical): Not on file  . Lack of Transportation (Non-Medical): Not on file  Physical Activity:   . Days of Exercise per Week: Not on file  . Minutes of Exercise per Session: Not on file  Stress:   . Feeling of Stress : Not on file  Social Connections:   . Frequency of Communication with Friends and Family: Not on file  . Frequency of Social Gatherings with Friends and Family: Not on file  . Attends Religious Services: Not on file  . Active  Member of Clubs or Organizations: Not on file  . Attends Archivist Meetings: Not on file  . Marital Status: Not on file  Intimate Partner Violence:   . Fear of Current or Ex-Partner: Not on file  . Emotionally Abused: Not on file  . Physically Abused: Not on file  . Sexually Abused: Not on file     No Known Allergies   Outpatient Medications Prior to Visit  Medication Sig Dispense Refill  . abiraterone acetate (ZYTIGA) 250 MG tablet TAKE 4 TABLETS (1,000 MG TOTAL) BY MOUTH DAILY. TAKE ON AN EMPTY STOMACH, 1 HOUR BEFORE OR 2 HOURS AFTER A MEAL. 120 tablet 11  . arformoterol (BROVANA) 15 MCG/2ML NEBU Take 2 mLs (15 mcg total) by nebulization 2 (two) times daily. 120 mL 6  . aspirin EC 81 MG tablet Take 1 tablet (81 mg total) by mouth daily. 90 tablet 3  . atorvastatin (LIPITOR) 40 MG tablet Take 1 tablet  (40 mg total) by mouth daily. 90 tablet 3  . budesonide (PULMICORT) 0.25 MG/2ML nebulizer solution Take 2 mLs (0.25 mg total) by nebulization in the morning and at bedtime. 60 mL 12  . diltiazem (CARDIZEM CD) 360 MG 24 hr capsule Take 1 capsule (360 mg total) by mouth daily. 90 capsule 0  . losartan (COZAAR) 50 MG tablet TAKE ONE TABLET by mouth EVERY EVENING, 90 tablet 0  . methocarbamol (ROBAXIN) 500 MG tablet Take 1 tablet by mouth daily as needed for muscle spasms.     . Multiple Vitamins-Minerals (MULTIVITAMIN WITH MINERALS) tablet Take 1 tablet by mouth daily.    . potassium chloride SA (KLOR-CON) 20 MEQ tablet Take 1 tablet (20 mEq total) by mouth 2 (two) times daily. 60 tablet 2  . predniSONE (DELTASONE) 5 MG tablet TAKE 1 TABLET BY MOUTH DAILY WITH BREAKFAST (Patient taking differently: Take 5 mg by mouth daily with breakfast. ) 30 tablet 2  . solifenacin (VESICARE) 5 MG tablet Take 5 mg by mouth daily.    . tamsulosin (FLOMAX) 0.4 MG CAPS capsule Take 1 capsule (0.4 mg total) by mouth daily after supper. (Patient taking differently: Take 0.4 mg by mouth at bedtime. ) 30 capsule 0  . torsemide (DEMADEX) 20 MG tablet Take 1 tablet (20 mg total) by mouth in the morning. 90 tablet 0  . traMADol (ULTRAM) 50 MG tablet Take 50 mg by mouth 2 (two) times daily as needed for moderate pain.      No facility-administered medications prior to visit.    Review of Systems  Constitutional: Negative for chills, fever, malaise/fatigue and weight loss.  HENT: Negative for congestion and sore throat.   Eyes: Negative.   Respiratory: Positive for shortness of breath (with exertion). Negative for cough, hemoptysis, sputum production and wheezing.   Cardiovascular: Positive for leg swelling. Negative for chest pain.  Gastrointestinal: Negative for heartburn, nausea and vomiting.  Genitourinary: Negative.   Musculoskeletal: Negative.   Neurological: Negative.  Negative for weakness and headaches.    Psychiatric/Behavioral: Negative.    Objective:   Vitals:   08/01/20 1044  BP: 124/68  Pulse: 82  SpO2: 97%  Weight: 235 lb (106.6 kg)  Height: 5\' 7"  (5.916 m)     Physical Exam Constitutional:      General: He is not in acute distress.    Appearance: He is obese. He is not ill-appearing.  HENT:     Head: Normocephalic and atraumatic.     Nose: Nose normal.  Eyes:  General: No scleral icterus.    Conjunctiva/sclera: Conjunctivae normal.  Cardiovascular:     Rate and Rhythm: Normal rate and regular rhythm.  Pulmonary:     Effort: Pulmonary effort is normal.     Breath sounds: Rales (faint, bilateral bases) present. No wheezing or rhonchi.  Abdominal:     General: Bowel sounds are normal.     Palpations: Abdomen is soft.  Musculoskeletal:     Right lower leg: Edema present.     Left lower leg: Edema present.  Skin:    General: Skin is warm and dry.  Neurological:     General: No focal deficit present.     Mental Status: He is alert.  Psychiatric:        Mood and Affect: Mood normal.        Behavior: Behavior normal.        Thought Content: Thought content normal.        Judgment: Judgment normal.     CBC    Component Value Date/Time   WBC 9.6 06/15/2020 0834   WBC 13.0 (H) 05/11/2020 1752   RBC 4.44 06/15/2020 0834   HGB 11.2 (L) 06/15/2020 0834   HGB 15.2 09/10/2017 1509   HCT 36.1 (L) 06/15/2020 0834   HCT 45.5 09/10/2017 1509   PLT 341 06/15/2020 0834   PLT Clumped Platelets--Appears Adequate 09/10/2017 1509   MCV 81.3 06/15/2020 0834   MCV 84.7 09/10/2017 1509   MCH 25.2 (L) 06/15/2020 0834   MCHC 31.0 06/15/2020 0834   RDW 15.7 (H) 06/15/2020 0834   RDW 14.4 09/10/2017 1509   LYMPHSABS 1.2 06/15/2020 0834   LYMPHSABS 1.1 09/10/2017 1509   MONOABS 0.6 06/15/2020 0834   MONOABS 0.5 09/10/2017 1509   EOSABS 0.4 06/15/2020 0834   EOSABS 0.1 09/10/2017 1509   BASOSABS 0.1 06/15/2020 0834   BASOSABS 0.1 09/10/2017 1509   BMP Latest Ref Rng  & Units 06/15/2020 06/13/2020 06/02/2020  Glucose 70 - 99 mg/dL 124(H) 102(H) 106(H)  BUN 8 - 23 mg/dL 18 16 17   Creatinine 0.61 - 1.24 mg/dL 1.07 0.95 1.13  BUN/Creat Ratio 10 - 24 - 17 15  Sodium 135 - 145 mmol/L 141 142 138  Potassium 3.5 - 5.1 mmol/L 3.6 3.6 3.2(L)  Chloride 98 - 111 mmol/L 103 100 92(L)  CO2 22 - 32 mmol/L 31 28 33(H)  Calcium 8.9 - 10.3 mg/dL 9.7 9.3 9.4     PFT: PFT Results Latest Ref Rng & Units 06/29/2020  FVC-Pre L 2.98  FVC-Predicted Pre % 77  FVC-Post L 3.01  FVC-Predicted Post % 78  Pre FEV1/FVC % % 75  Post FEV1/FCV % % 77  FEV1-Pre L 2.25  FEV1-Predicted Pre % 79  FEV1-Post L 2.32  DLCO uncorrected ml/min/mmHg 19.77  DLCO UNC% % 85  DLCO corrected ml/min/mmHg 22.15  DLCO COR %Predicted % 95  DLVA Predicted % 120  TLC L 4.80  TLC % Predicted % 77  RV % Predicted % 76   Chest imaging: CXR 05/18/20 reviewed: Bilateral pleural effusions (improved somewhat), interstitial thickening. CTA Chest 05/05/20 reviewed: Bilateral pleural effusions with bibasilar compressive atelectasis. Fluid tracking along left major fissure. Dense airspace consolidation of left base.   Echo: EF 60-65%. LV is hyperdynamic with moderate LVH. Grade II diastolic dysfunction of LV. RV systolic function is normal. Mild to moderate aortic valve sclerosis. Tricuspid regurgitation present.   Heart Catheterization: 05/08/30: Hyperdynamic LVEF, EF 65 to 70%. Mildly elevated LV EDP. Mild diffuse coronary artery  disease with mild coronary calcification involving LAD, circumflex and dominant RCA. LAD is very small and gives origin to a very large LAD: D1 which reaches the apex.     Assessment & Plan:   OSA (obstructive sleep apnea)  Obesity (BMI 35.0-39.9 without comorbidity)  Asthma, unspecified asthma severity, unspecified whether complicated, unspecified whether persistent  Discussion: Michah Minton is a 69 year old male, former smoker with HFpEF and obesity who returns for  evaluation of shortness of breath. His dyspnea is multifactorial due to congestive heart failure, obstructive sleep apnea and possible asthma/reactive airways disease.   He has started on CPAP therapy and has follow up with sleep medicine next week.   He has benefited from budesonide and brovana nebulizer therapy but due to the cost we will transition him to [employer] inhaler to see if he has continued relief with the inhaler as he has preferred nebulizer therapy in the past. We will send in prescription for the inahler and work on prior authorization if not covered by his insurance.   He has lost 10lbs since the last visit and we encouraged him to continue working on the weight loss.    He is following with cardiology for the diastolic congestive heart failure. He does have lower extremity edema on exam today. Diuretic management per cardiology team and he is on torsemide 20mg  daily. He is also aware to follow a low sodium diet.  Follow up in 4 months  Freda Jackson, MD [facility] Office: [phone]   See Amion for Pager Details        Current Outpatient Medications:  .  abiraterone acetate (ZYTIGA) 250 MG tablet, TAKE 4 TABLETS (1,000 MG TOTAL) BY MOUTH DAILY. TAKE ON AN EMPTY STOMACH, 1 HOUR BEFORE OR 2 HOURS AFTER A MEAL., Disp: 120 tablet, Rfl: 11 .  arformoterol (BROVANA) 15 MCG/2ML NEBU, Take 2 mLs (15 mcg total) by nebulization 2 (two) times daily., Disp: 120 mL, Rfl: 6 .  aspirin EC 81 MG tablet, Take 1 tablet (81 mg total) by mouth daily., Disp: 90 tablet, Rfl: 3 .  atorvastatin (LIPITOR) 40 MG tablet, Take 1 tablet (40 mg total) by mouth daily., Disp: 90 tablet, Rfl: 3 .  budesonide (PULMICORT) 0.25 MG/2ML nebulizer solution, Take 2 mLs (0.25 mg total) by nebulization in the morning and at bedtime., Disp: 60 mL, Rfl: 12 .  diltiazem (CARDIZEM CD) 360 MG 24 hr capsule, Take 1 capsule (360 mg total) by mouth daily., Disp: 90 capsule, Rfl: 0 .   losartan (COZAAR) 50 MG tablet, TAKE ONE TABLET by mouth EVERY EVENING,, Disp: 90 tablet, Rfl: 0 .  methocarbamol (ROBAXIN) 500 MG tablet, Take 1 tablet by mouth daily as needed for muscle spasms. , Disp: , Rfl:  .  Multiple Vitamins-Minerals (MULTIVITAMIN WITH MINERALS) tablet, Take 1 tablet by mouth daily., Disp: , Rfl:  .  potassium chloride SA (KLOR-CON) 20 MEQ tablet, Take 1 tablet (20 mEq total) by mouth 2 (two) times daily., Disp: 60 tablet, Rfl: 2 .  predniSONE (DELTASONE) 5 MG tablet, TAKE 1 TABLET BY MOUTH DAILY WITH BREAKFAST (Patient taking differently: Take 5 mg by mouth daily with breakfast. ), Disp: 30 tablet, Rfl: 2 .  solifenacin (VESICARE) 5 MG tablet, Take 5 mg by mouth daily., Disp: , Rfl:  .  tamsulosin (FLOMAX) 0.4 MG CAPS capsule, Take 1 capsule (0.4 mg total) by mouth daily after supper. (Patient taking differently: Take 0.4 mg by mouth at bedtime. ), Disp:  30 capsule, Rfl: 0 .  torsemide (DEMADEX) 20 MG tablet, Take 1 tablet (20 mg total) by mouth in the morning., Disp: 90 tablet, Rfl: 0 .  traMADol (ULTRAM) 50 MG tablet, Take 50 mg by mouth 2 (two) times daily as needed for moderate pain. , Disp: , Rfl:  .  fluticasone furoate-vilanterol (BREO ELLIPTA) 200-25 MCG/INH AEPB, Inhale 1 puff into the lungs daily., Disp: 14 each, Rfl: 0

## 2020-08-08 ENCOUNTER — Other Ambulatory Visit: Payer: Self-pay

## 2020-08-08 ENCOUNTER — Encounter: Payer: Self-pay | Admitting: Pulmonary Disease

## 2020-08-08 ENCOUNTER — Ambulatory Visit: Payer: Medicare HMO | Admitting: Pulmonary Disease

## 2020-08-08 VITALS — BP 122/68 | HR 80 | Temp 98.3°F | Ht 67.0 in | Wt 234.4 lb

## 2020-08-08 DIAGNOSIS — G4733 Obstructive sleep apnea (adult) (pediatric): Secondary | ICD-10-CM | POA: Diagnosis not present

## 2020-08-08 DIAGNOSIS — I5032 Chronic diastolic (congestive) heart failure: Secondary | ICD-10-CM | POA: Diagnosis not present

## 2020-08-08 NOTE — Progress Notes (Signed)
Risks associated with not treating sleep apnea discussed              Bobby [name]    [id number]    Dec 05, 1950  Primary Care Physician:South, [name], MD  Referring Physician: [name], [address] [place],  [place] [address]  Chief complaint:   History of obstructive sleep apnea  HPI:  Diagnosed with obstructive sleep apnea, started on CPAP therapy Has been using CPAP for about 2 weeks now Finding it difficult to get used to it  Diagnosed with moderate obstructive sleep apnea with severe oxygen desaturations Started on auto titrating CPAP  Just has difficulty sleeping with the mask on Has been trying to use it regularly  Admits to significant dryness of his mouth in the mornings Denies headaches Has managed to lose about 40 pounds recently  Usually goes to bed about 11 PM, falls asleep easily About 3 awakenings Final wake up time about 7 AM  Reformed smoker  Outpatient Encounter Medications as of 08/08/2020  Medication Sig  . abiraterone acetate (ZYTIGA) 250 MG tablet TAKE 4 TABLETS (1,000 MG TOTAL) BY MOUTH DAILY. TAKE ON AN EMPTY STOMACH, 1 HOUR BEFORE OR 2 HOURS AFTER A MEAL.  Marland Kitchen arformoterol (BROVANA) 15 MCG/2ML NEBU Take 2 mLs (15 mcg total) by nebulization 2 (two) times daily.  Marland Kitchen aspirin EC 81 MG tablet Take 1 tablet (81 mg total) by mouth daily.  Marland Kitchen atorvastatin (LIPITOR) 40 MG tablet Take 1 tablet (40 mg total) by mouth daily.  . budesonide (PULMICORT) 0.25 MG/2ML nebulizer solution Take 2 mLs (0.25 mg total) by nebulization in the morning and at bedtime.  Marland Kitchen diltiazem (CARDIZEM CD) 360 MG 24 hr capsule Take 1 capsule (360 mg total) by mouth daily.  Marland Kitchen losartan (COZAAR) 50 MG tablet TAKE ONE TABLET by mouth EVERY EVENING,  . methocarbamol (ROBAXIN) 500 MG tablet Take 1 tablet by mouth daily as needed for muscle spasms.   . Multiple Vitamins-Minerals (MULTIVITAMIN WITH MINERALS) tablet Take 1 tablet by mouth daily.  . potassium chloride SA  (KLOR-CON) 20 MEQ tablet Take 1 tablet (20 mEq total) by mouth 2 (two) times daily.  . predniSONE (DELTASONE) 5 MG tablet TAKE 1 TABLET BY MOUTH DAILY WITH BREAKFAST (Patient taking differently: Take 5 mg by mouth daily with breakfast. )  . solifenacin (VESICARE) 5 MG tablet Take 5 mg by mouth daily.  . tamsulosin (FLOMAX) 0.4 MG CAPS capsule Take 1 capsule (0.4 mg total) by mouth daily after supper. (Patient taking differently: Take 0.4 mg by mouth at bedtime. )  . torsemide (DEMADEX) 20 MG tablet Take 1 tablet (20 mg total) by mouth in the morning.  . traMADol (ULTRAM) 50 MG tablet Take 50 mg by mouth 2 (two) times daily as needed for moderate pain.   . fluticasone furoate-vilanterol (BREO ELLIPTA) 200-25 MCG/INH AEPB Inhale 1 puff into the lungs daily.   No facility-administered encounter medications on file as of 08/08/2020.    Allergies as of 08/08/2020  . (No Known Allergies)    Past Medical History:  Diagnosis Date  . Bronchitis   . CHF (congestive heart failure) ([place]) 04/2020   A/C HFpEF  . Coronary artery calcification of native artery   . Family history of pancreatic cancer   . History of kidney stones   . Hyperlipidemia   . Hypertension   . Pneumonia   . Prostate cancer [facility])     Past Surgical History:  Procedure Laterality Date  . EXTRACORPOREAL SHOCK  WAVE LITHOTRIPSY Right 04/17/2017   Procedure: RIGHT EXTRACORPOREAL SHOCK WAVE LITHOTRIPSY (ESWL);  Surgeon: Kathie Rhodes, MD;  Location: [facility];  Service: Urology;  Laterality: Right;  . HERNIA REPAIR     age 2  . LEFT HEART CATH AND CORONARY ANGIOGRAPHY N/A 05/08/2020   Procedure: LEFT HEART CATH AND CORONARY ANGIOGRAPHY;  Surgeon: Adrian Prows, MD;  Location: [facility];  Service: Cardiovascular;  Laterality: N/A;  . PROSTATE BIOPSY      Family History  Problem Relation Age of Onset  . Breast cancer Sister 22       "negative genetic testing"  . Pancreatic cancer Brother 21  . Breast cancer Maternal Aunt    . Kidney cancer Maternal Aunt        dx 78s    Social History   Socioeconomic History  . Marital status: Married    Spouse name: Not on file  . Number of children: 2  . Years of education: Not on file  . Highest education level: Not on file  Occupational History  . Not on file  Tobacco Use  . Smoking status: Former Smoker    Packs/day: 0.25    Years: 10.00    Pack years: 2.50    Types: Cigarettes    Quit date: 01/20/2012    Years since quitting: 8.5  . Smokeless tobacco: Never Used  Vaping Use  . Vaping Use: Never used  Substance and Sexual Activity  . Alcohol use: No  . Drug use: No  . Sexual activity: Yes  Other Topics Concern  . Not on file  Social History Narrative  . Not on file   Social Determinants of Health   Financial Resource Strain:   . Difficulty of Paying Living Expenses: Not on file  Food Insecurity:   . Worried About Charity fundraiser in the Last Year: Not on file  . Ran Out of Food in the Last Year: Not on file  Transportation Needs:   . Lack of Transportation (Medical): Not on file  . Lack of Transportation (Non-Medical): Not on file  Physical Activity:   . Days of Exercise per Week: Not on file  . Minutes of Exercise per Session: Not on file  Stress:   . Feeling of Stress : Not on file  Social Connections:   . Frequency of Communication with Friends and Family: Not on file  . Frequency of Social Gatherings with Friends and Family: Not on file  . Attends Religious Services: Not on file  . Active Member of Clubs or Organizations: Not on file  . Attends Archivist Meetings: Not on file  . Marital Status: Not on file  Intimate Partner Violence:   . Fear of Current or Ex-Partner: Not on file  . Emotionally Abused: Not on file  . Physically Abused: Not on file  . Sexually Abused: Not on file    Review of Systems  Constitutional: Positive for fatigue.  Respiratory: Positive for apnea. Negative for shortness of breath.    Psychiatric/Behavioral: Positive for sleep disturbance.    Vitals:   08/08/20 1048  BP: 122/68  Pulse: 80  Temp: 98.3 F (36.8 C)  SpO2: 96%     Physical Exam Constitutional:      Appearance: He is obese.  HENT:     Head: Normocephalic and atraumatic.     Nose: No congestion or rhinorrhea.  Eyes:     General:        Right eye: No  discharge.        Left eye: No discharge.  Cardiovascular:     Rate and Rhythm: Normal rate and regular rhythm.     Heart sounds: No murmur heard.  No friction rub.  Pulmonary:     Effort: No respiratory distress.     Breath sounds: No stridor. No wheezing or rhonchi.  Musculoskeletal:     Cervical back: No rigidity or tenderness.  Neurological:     Mental Status: He is alert.  Psychiatric:        Mood and Affect: Mood normal.    Data Reviewed: Sleep study was reviewed by myself  Assessment:  Moderate obstructive sleep apnea -Currently on CPAP therapy -He is having some difficulty tolerating CPAP at present but willing to keep  Obesity -He is having success at getting his weight down  Nocturnal desaturations -May be helpful with CPAP therapy  Pathophysiology of sleep apnea was reviewed with the patient Treatment options discussed  Plan/Recommendations: We will get an overnight oximetry on CPAP  We will obtain compliance data on him Is only been using CPAP for about 2 weeks now  Encourage nightly use of CPAP therapy  Encouraged continuing weight loss efforts  Risks with not treating sleep disordered breathing discussed  I will see him back in about 6 weeks     Sherrilyn Rist MD [facility] 08/08/2020, 11:29 AM  CC: [name], MD

## 2020-08-08 NOTE — Patient Instructions (Signed)
Schedule overnight oximetry on CPAP-this will be done through the medical supply company-we will contact them  Download from your CPAP machine in about 3 to 4 weeks will be obtained  Continue using CPAP nightly -Try and keep it on for as long as you have sleeping  -If you are having significant difficulty with getting used to it, we may be able to try a mild sleep aid called Lunesta  -Continue your weight loss efforts  -I will see you back in 6 weeks

## 2020-08-14 ENCOUNTER — Other Ambulatory Visit: Payer: Self-pay | Admitting: *Deleted

## 2020-08-14 MED ORDER — PREDNISONE 5 MG PO TABS
5.0000 mg | ORAL_TABLET | Freq: Every day | ORAL | 3 refills | Status: DC
Start: 2020-08-14 — End: 2020-10-17

## 2020-08-15 NOTE — Telephone Encounter (Signed)
Oral Oncology Patient Advocate Encounter  Met patient in lobby to complete re-enrollment application for [name] and [name] in an effort to reduce patient's out of pocket expense for Zytiga to $0.    Application completed and faxed to [phone].   [employer] patient assistance phone number for follow up is [phone].   This encounter will be updated until final determination.  [place] Patient Bobby [name] Phone [phone] Fax [phone] 08/15/2020 2:22 PM

## 2020-08-18 ENCOUNTER — Telehealth: Payer: Self-pay | Admitting: Pulmonary Disease

## 2020-08-18 DIAGNOSIS — R0602 Shortness of breath: Secondary | ICD-10-CM | POA: Diagnosis not present

## 2020-08-18 NOTE — Telephone Encounter (Signed)
Overnight oximetry reviewed on room air He had about 58 minutes of desaturations below 88%  This will be consistent with needing oxygen supplementation however patient has obstructive sleep apnea and should be on CPAP therapy  Unclear why the oximetry was performed on RA  To continue CPAP therapy

## 2020-08-20 ENCOUNTER — Other Ambulatory Visit: Payer: Self-pay | Admitting: Cardiology

## 2020-08-20 DIAGNOSIS — I5032 Chronic diastolic (congestive) heart failure: Secondary | ICD-10-CM

## 2020-08-20 DIAGNOSIS — R0609 Other forms of dyspnea: Secondary | ICD-10-CM

## 2020-08-21 ENCOUNTER — Other Ambulatory Visit: Payer: Self-pay | Admitting: Cardiology

## 2020-08-21 DIAGNOSIS — R69 Illness, unspecified: Secondary | ICD-10-CM | POA: Diagnosis not present

## 2020-08-21 DIAGNOSIS — E876 Hypokalemia: Secondary | ICD-10-CM

## 2020-08-25 DIAGNOSIS — G4733 Obstructive sleep apnea (adult) (pediatric): Secondary | ICD-10-CM | POA: Diagnosis not present

## 2020-08-25 NOTE — Telephone Encounter (Signed)
Patient is approved for Zytiga at no charge through [employer] 09/09/20-09/08/21.  [place] Patient [facility] Phone [phone] Fax [phone] 08/25/2020 10:33 AM

## 2020-09-18 ENCOUNTER — Other Ambulatory Visit: Payer: Self-pay | Admitting: Cardiology

## 2020-09-18 DIAGNOSIS — I5032 Chronic diastolic (congestive) heart failure: Secondary | ICD-10-CM | POA: Diagnosis not present

## 2020-09-18 DIAGNOSIS — R0600 Dyspnea, unspecified: Secondary | ICD-10-CM

## 2020-09-18 DIAGNOSIS — R0609 Other forms of dyspnea: Secondary | ICD-10-CM

## 2020-09-19 ENCOUNTER — Other Ambulatory Visit: Payer: Self-pay

## 2020-09-19 ENCOUNTER — Encounter: Payer: Self-pay | Admitting: Cardiology

## 2020-09-19 ENCOUNTER — Ambulatory Visit: Payer: Medicare Other | Admitting: Cardiology

## 2020-09-19 VITALS — BP 114/68 | HR 99 | Resp 16 | Ht 67.0 in | Wt 232.0 lb

## 2020-09-19 DIAGNOSIS — R0609 Other forms of dyspnea: Secondary | ICD-10-CM | POA: Diagnosis not present

## 2020-09-19 DIAGNOSIS — R0600 Dyspnea, unspecified: Secondary | ICD-10-CM

## 2020-09-19 DIAGNOSIS — E782 Mixed hyperlipidemia: Secondary | ICD-10-CM | POA: Diagnosis not present

## 2020-09-19 DIAGNOSIS — I5032 Chronic diastolic (congestive) heart failure: Secondary | ICD-10-CM | POA: Diagnosis not present

## 2020-09-19 DIAGNOSIS — I2584 Coronary atherosclerosis due to calcified coronary lesion: Secondary | ICD-10-CM

## 2020-09-19 DIAGNOSIS — Z6836 Body mass index (BMI) 36.0-36.9, adult: Secondary | ICD-10-CM

## 2020-09-19 DIAGNOSIS — I2510 Atherosclerotic heart disease of native coronary artery without angina pectoris: Secondary | ICD-10-CM | POA: Diagnosis not present

## 2020-09-19 DIAGNOSIS — I10 Essential (primary) hypertension: Secondary | ICD-10-CM

## 2020-09-19 DIAGNOSIS — Z87891 Personal history of nicotine dependence: Secondary | ICD-10-CM

## 2020-09-19 LAB — BASIC METABOLIC PANEL
BUN/Creatinine Ratio: 18 (ref 10–24)
BUN: 20 mg/dL (ref 8–27)
CO2: 25 mmol/L (ref 20–29)
Calcium: 9.9 mg/dL (ref 8.6–10.2)
Chloride: 99 mmol/L (ref 96–106)
Creatinine, Ser: 1.14 mg/dL (ref 0.76–1.27)
GFR calc Af Amer: 76 mL/min/{1.73_m2} (ref >59)
GFR calc non Af Amer: 66 mL/min/{1.73_m2} (ref >59)
Glucose: 139 mg/dL — ABNORMAL HIGH (ref 65–99)
Potassium: 3.4 mmol/L — ABNORMAL LOW (ref 3.5–5.2)
Sodium: 139 mmol/L (ref 134–144)

## 2020-09-19 LAB — MAGNESIUM: Magnesium: 2.1 mg/dL (ref 1.6–2.3)

## 2020-09-19 LAB — PRO B NATRIURETIC PEPTIDE: NT-Pro BNP: 335 pg/mL (ref 0–376)

## 2020-09-19 MED ORDER — TORSEMIDE 10 MG PO TABS: 10.0000 mg | ORAL_TABLET | Freq: Every morning | ORAL | 0 refills | Status: DC

## 2020-09-19 NOTE — Progress Notes (Signed)
ID:  Bobby Forster., DOB [birth year], MRN [id number]  PCP:  [name], MD  Cardiologist: Rex Kras, DO, [facility] (established care 01/21/2020)  Date: 09/19/20 Last Office Visit: 07/04/2020  Chief Complaint  Patient presents with  . Follow-up    3 months  . Near Syncope  . Congestive Heart Failure    Bobby Forster. is a 70 y.o. male who presents to the office with a  chief complaint of "3 month followup for heart failure management and near syncope."  His past medical history and cardiovascular risk factors are: Nonobstructive coronary artery disease, severe coronary artery calcification 1101 AU, chronic HFpEF/stage C/NYHA class II, prediabetes, hyperlipidemia, hypertension, advanced age, former smoker, obesity due to excess calories.  Patient is accompanied today by his wife [name].   Patient was originally referred to the office for evaluation of  coronary artery calcification.   Given the coronary artery calcification and dyspnea on exertion patient underwent an ischemic evaluation including a coronary CTA results and thereafter underwent left heart catheterization during one of his subsequent hospitalizations results noted below.    Patient is currently being treated for heart failure with preserved EF.  His medications has been uptitrated and feels better.  He has not been hospitalized or seen in urgent care over the last few months of cardiovascular symptoms.  He has lost 14 pounds since last office visit.  Patient states that he is also been fitted for nasal CPAP and feels great and states " it is night and day difference."   For the last couple days she has been having episodes of near syncope when changing positions.  He is compliant with his current medical therapy.  Patient denies any syncope.  Blood work from 09/18/2020 reviewed with him at today's office visit.  Kidney function and NT proBNP at baseline.  Patient is mildly hypokalemic.  No family history of  premature coronary disease or sudden cardiac death.  FUNCTIONAL STATUS: Walking more with grandkids, as per patient's wife.  ALLERGIES: No Known Allergies  MEDICATION LIST PRIOR TO VISIT: Current Meds  Medication Sig  . abiraterone acetate (ZYTIGA) 250 MG tablet TAKE 4 TABLETS (1,000 MG TOTAL) BY MOUTH DAILY. TAKE ON AN EMPTY STOMACH, 1 HOUR BEFORE OR 2 HOURS AFTER A MEAL.  Marland Kitchen arformoterol (BROVANA) 15 MCG/2ML NEBU Take 2 mLs (15 mcg total) by nebulization 2 (two) times daily.  Marland Kitchen aspirin EC 81 MG tablet Take 1 tablet (81 mg total) by mouth daily.  Marland Kitchen atorvastatin (LIPITOR) 40 MG tablet Take 1 tablet (40 mg total) by mouth daily.  . budesonide (PULMICORT) 0.25 MG/2ML nebulizer solution Take 2 mLs (0.25 mg total) by nebulization in the morning and at bedtime.  Marland Kitchen diltiazem (CARDIZEM CD) 360 MG 24 hr capsule TAKE ONE CAPSULE by mouth EVERY DAY  . losartan (COZAAR) 50 MG tablet TAKE ONE TABLET by mouth EVERY EVENING,  . methocarbamol (ROBAXIN) 500 MG tablet Take 1 tablet by mouth daily as needed for muscle spasms.   . Multiple Vitamins-Minerals (MULTIVITAMIN WITH MINERALS) tablet Take 1 tablet by mouth daily.  . potassium chloride SA (KLOR-CON) 20 MEQ tablet TAKE 1 Tablet BY MOUTH TWICE DAILY  . predniSONE (DELTASONE) 5 MG tablet Take 1 tablet (5 mg total) by mouth daily with breakfast.  . solifenacin (VESICARE) 5 MG tablet Take 5 mg by mouth daily.  . tamsulosin (FLOMAX) 0.4 MG CAPS capsule Take 1 capsule (0.4 mg total) by mouth daily after supper. (Patient taking differently: Take 0.4  mg by mouth at bedtime.)  . traMADol (ULTRAM) 50 MG tablet Take 50 mg by mouth 2 (two) times daily as needed for moderate pain.   . [DISCONTINUED] torsemide (DEMADEX) 20 MG tablet Take 1 tablet (20 mg total) by mouth in the morning.     PAST MEDICAL HISTORY: Past Medical History:  Diagnosis Date  . Bronchitis   . CHF (congestive heart failure) ([place]) 04/2020   A/C HFpEF  . Coronary artery calcification of  native artery   . Family history of pancreatic cancer   . History of kidney stones   . Hyperlipidemia   . Hypertension   . Pneumonia   . Prostate cancer ([place])     PAST SURGICAL HISTORY: Past Surgical History:  Procedure Laterality Date  . EXTRACORPOREAL SHOCK WAVE LITHOTRIPSY Right 04/17/2017   Procedure: RIGHT EXTRACORPOREAL SHOCK WAVE LITHOTRIPSY (ESWL);  Surgeon: Kathie Rhodes, MD;  Location: [facility];  Service: Urology;  Laterality: Right;  . HERNIA REPAIR     age 56  . LEFT HEART CATH AND CORONARY ANGIOGRAPHY N/A 05/08/2020   Procedure: LEFT HEART CATH AND CORONARY ANGIOGRAPHY;  Surgeon: Adrian Prows, MD;  Location: [facility];  Service: Cardiovascular;  Laterality: N/A;  . PROSTATE BIOPSY      FAMILY HISTORY: The patient family history includes Breast cancer in his maternal aunt; Breast cancer (age of onset: 52) in his sister; Kidney cancer in his maternal aunt; Pancreatic cancer (age of onset: 62) in his brother.  SOCIAL HISTORY:  The patient  reports that he quit smoking about 8 years ago. His smoking use included cigarettes. He has a 2.50 pack-year smoking history. He has never used smokeless tobacco. He reports that he does not drink alcohol and does not use drugs.  REVIEW OF SYSTEMS: Review of Systems  Constitutional: Positive for weight loss. Negative for chills, fever and malaise/fatigue.  HENT: Negative for hoarse voice and nosebleeds.   Eyes: Negative for discharge, double vision and pain.  Cardiovascular: Positive for dyspnea on exertion (improving). Negative for chest pain, claudication, leg swelling, near-syncope, orthopnea, palpitations, paroxysmal nocturnal dyspnea and syncope.  Respiratory: Positive for shortness of breath (improving). Negative for hemoptysis.        Daytime sleepiness (improved).   Musculoskeletal: Negative for muscle cramps and myalgias.  Gastrointestinal: Negative for abdominal pain, constipation, diarrhea, hematemesis, hematochezia,  melena, nausea and vomiting.  Neurological: Positive for dizziness and light-headedness.   PHYSICAL EXAM: Vitals with BMI 09/19/2020 08/08/2020 08/01/2020  Height 5\' 7"  5\' 7"  5\' 7"   Weight 232 lbs 234 lbs 6 oz 235 lbs  BMI 36.33 69.4 85.4  Systolic 627 035 009  Diastolic 68 68 68  Pulse 99 80 82   Orthostatic VS for the past 72 hrs (Last 3 readings):  Orthostatic BP Patient Position BP Location Cuff Size Orthostatic Pulse  09/19/20 1024 97/75 Standing Left Arm Large 74  09/19/20 1023 124/71 Sitting Left Arm Large 88  09/19/20 1021 112/73 Supine Left Arm Large 86    CONSTITUTIONAL: Well-developed and well-nourished. No acute distress.  SKIN: Skin is warm and dry. No rash noted. No cyanosis. No pallor. No jaundice HEAD: Normocephalic and atraumatic.  EYES: No scleral icterus MOUTH/THROAT: Moist oral membranes.  NECK: No JVD present. No thyromegaly noted. No carotid bruits  LYMPHATIC: No visible cervical adenopathy.  CHEST Normal respiratory effort. No intercostal retractions  LUNGS: Clear to auscultation bilaterally. No stridor. No wheezes. No rales.  CARDIOVASCULAR: Regular rate and rhythm, positive S1-S2, no murmurs rubs or  gallops appreciated. ABDOMINAL: Obese, soft, nontender, nondistended, positive bowel sounds all 4 quadrants. No apparent ascites.  EXTREMITIES: No bilateral pitting peripheral edema  HEMATOLOGIC: No significant bruising NEUROLOGIC: Oriented to person, place, and time. Nonfocal. Normal muscle tone.  PSYCHIATRIC: Normal mood and affect. Normal behavior. Cooperative  RADIOLOGY: CT angio chest PE with and without contrast September 02, 2019:Cardiovascular: There is mild cardiomegaly. No pericardial effusion. There is multi vessel coronary vascular calcification. There is mild atherosclerotic calcification of the thoracic aorta.  CARDIAC DATABASE: EKG: 01/21/2020: Normal sinus rhythm, 92 bpm, normal axis, ST depressions in the high lateral and lateral leads, no  evidence of myocardial injury pattern.   Echocardiogram: 01/26/2020: Hyperdynamic LV systolic function with visual EF >70%. Intraventricular PG of 31 mm Hg detected.  Left ventricle cavity is normal in size. Moderate concentric hypertrophy of the left ventricle. Normal global wall motion. Doppler evidence of grade I (impaired) diastolic dysfunction, elevated LAP. Mild (Grade I) aortic regurgitation. Mild (Grade I) mitral regurgitation.   05/06/2020: LVEF 60-65%, hyperdynamic LVEF, moderate LVH, grade 2 diastolic impairment, elevated LVEDP, RV systolic function and size are within normal limits, trivial TR, trivial MR, mild to moderate aortic valve sclerosis without stenosis.  Stress Testing: Lexiscan/modified Bruce Tetrofosmin stress test 02/23/2020: Stress EKG showed sinus tachycardia, inferolateral T wave inversion.  SPECT images show small sized, medium intensity, mid to basal inferolateral perfusion defect with mild reversibility. Stress LVEF 82%. Low risk study.  Coronary CTA 04/26/2020: 1. Coronary calcium score of 1101. This was 89th percentile for age and sex matched control. 2. Normal coronary origin with right dominance. 3.  Minimal calcified plaque within the left main artery. Moderate to severe stenosis within the proximal/mid LAD segment to calcified plaque. Severe stenosis at the ostial segment of the 1st diagonal branch due calcified plaque. Severe stenosis within the proximal segment due to eccentric calcified plaque within the LCX. Moderate stenosis within the distal RCA due to calcified plaque. Of note, severity of the stenosis may be overestimated due to blooming artifact caused by calcified plaque. 4. CADRADS = 4. Cardiac catheterization or CT FFR is recommended. Consider symptom-guided anti-ischemic pharmacotherapy as well as risk factor modification per guideline directed care. Invasive coronary angiography recommended with revascularization per published guideline  statements. 4. Study is sent for CT-FFR findings will be performed and reported   CT-FFR 04/27/2020: CT FFR analysis showed no significant stenosis.  Heart Catheterization: Left Heart Catheterization 05/08/20:  Hyperdynamic LVEF, EF 65 to 70%.  Mildly elevated LV EDP. Mild diffuse coronary artery disease with mild coronary calcification involving LAD, circumflex and dominant RCA.  LAD is very small and gives origin to a very large LAD: D1 which reaches the apex. Recommendation: Patient symptoms are related to acute diastolic heart failure with elevated LVEDP.  LABORATORY DATA: CBC Latest Ref Rng & Units 06/15/2020 05/11/2020 05/08/2020  WBC 4.0 - 10.5 K/uL 9.6 13.0(H) 12.3(H)  Hemoglobin 13.0 - 17.0 g/dL 11.2(L) 11.6(L) 12.0(L)  Hematocrit 39.0 - 52.0 % 36.1(L) 36.8(L) 35.9(L)  Platelets 150 - 400 K/uL 341 365 324    CMP Latest Ref Rng & Units 09/18/2020 06/15/2020 06/13/2020  Glucose 65 - 99 mg/dL 139(H) 124(H) 102(H)  BUN 8 - 27 mg/dL 20 18 16   Creatinine 0.76 - 1.27 mg/dL 1.14 1.07 0.95  Sodium 134 - 144 mmol/L 139 141 142  Potassium 3.5 - 5.2 mmol/L 3.4(L) 3.6 3.6  Chloride 96 - 106 mmol/L 99 103 100  CO2 20 - 29 mmol/L 25 31 28   Calcium  8.6 - 10.2 mg/dL 9.9 9.7 9.3  Total Protein 6.5 - 8.1 g/dL - 6.8 -  Total Bilirubin 0.3 - 1.2 mg/dL - 0.4 -  Alkaline Phos 38 - 126 U/L - 103 -  AST 15 - 41 U/L - 12(L) -  ALT 0 - 44 U/L - 16 -   Hepatic Function Panel Recent Labs    05/05/20 1243 05/06/20 0303 06/15/20 0834  PROT 7.0 6.9 6.8  ALBUMIN 3.5 3.2* 3.2*  AST 16 13* 12*  ALT 15 15 16   ALKPHOS 72 73 103  BILITOT 0.6 1.0 0.4   External Labs: Lipid Panel 12/30/2019: total 155, Triglycerides 167, HDL 44, LDL 78  12/30/2019: A1C 5.4% Glucose Random 115 BUN 14, Creatinine, Serum 0.9  03/19/2019: PSA normal   06/05/2018: TSH 1.000  IMPRESSION:    ICD-10-CM   1. Chronic heart failure with preserved ejection fraction (HFpEF) (HCC)  I50.32 Pro b natriuretic peptide (BNP)     Magnesium    Basic metabolic panel    EKG [id number]    torsemide (DEMADEX) 10 MG tablet    Basic metabolic panel    Magnesium  2. Nonobstructive atherosclerosis of coronary artery  I25.10   3. DOE (dyspnea on exertion)  R06.00 torsemide (DEMADEX) 10 MG tablet  4. Coronary artery calcification  I25.10    I25.84   5. Mixed hyperlipidemia  E78.2   6. Former smoker  Z87.891   95. Benign hypertension  I10   8. Class 2 severe obesity due to excess calories with serious comorbidity and body mass index (BMI) of 36.0 to 36.9 in adult [facility])  E66.01    Z68.36      RECOMMENDATIONS: Bobby Tortorella. is a 70 y.o. male whose past medical history and cardiac risk factors include: Nonobstructive coronary artery disease, severe coronary artery calcification 1101 AU, chronic HFpEF/stage C/NYHA class II, prediabetes, hyperlipidemia, hypertension, advanced age, former smoker, obesity due to excess calories.  Chronic heart failure preserved EF, stage C, NYHA class II:  Has lost approximately 14 pounds in the last 3 months.  He is congratulated on his efforts.    Continue current medical therapy with exceptions as noted below.  Given his orthostatic vital signs will decrease torsemide to 10 mg p.o. every morning.  Check blood work in 1 week to evaluate kidney function electrolytes.  If patient continues to be hypokalemic we will have to increase the potassium to 3 times daily dosing.   Independently reviewed the results from 09/18/2020.  Blood pressure and weight scale log reviewed as the patient is currently enrolled into [facility] PCM management.  Hypokalemia: Continue Kdur 62mEq po bid. decrease torsemide and check electrolytes in 1 week  Coronary atherosclerosis due to calcified coronary lesions in the native artery without angina pectoris:  Continue aspirin.  Continue statin therapy. LDL goal is less than 70mg  /dL.   Educated on importance of improving modifiable cardiovascular risk factors  for the prevention/progression of CAD  Benign essential hypertension:  Patient's blood pressure at today's office visit is at goal.  But he is enrolled into ambulatory blood pressure monitoring and BP log reviewed.   Medications reconciled.  Low salt diet recommended.   Mixed hyperlipidemia: Continue lipitor.  Does not endorse any myalgias.  Goal LDL is less than 70 mg/dL.  Obesity, due to excess calories: Body mass index is 36.34 kg/m. . I reviewed with the patient the importance of diet, regular physical activity/exercise, weight loss.   . Patient is educated on  increasing physical activity gradually as tolerated.  With the goal of moderate intensity exercise for 30 minutes a day 5 days a week.  Former smoker: Educated on the importance of continued smoking cessation.  FINAL MEDICATION LIST END OF ENCOUNTER: Meds ordered this encounter  Medications  . torsemide (DEMADEX) 10 MG tablet    Sig: Take 1 tablet (10 mg total) by mouth in the morning.    Dispense:  90 tablet    Refill:  0    Current Outpatient Medications:  .  abiraterone acetate (ZYTIGA) 250 MG tablet, TAKE 4 TABLETS (1,000 MG TOTAL) BY MOUTH DAILY. TAKE ON AN EMPTY STOMACH, 1 HOUR BEFORE OR 2 HOURS AFTER A MEAL., Disp: 120 tablet, Rfl: 11 .  arformoterol (BROVANA) 15 MCG/2ML NEBU, Take 2 mLs (15 mcg total) by nebulization 2 (two) times daily., Disp: 120 mL, Rfl: 6 .  aspirin EC 81 MG tablet, Take 1 tablet (81 mg total) by mouth daily., Disp: 90 tablet, Rfl: 3 .  atorvastatin (LIPITOR) 40 MG tablet, Take 1 tablet (40 mg total) by mouth daily., Disp: 90 tablet, Rfl: 3 .  budesonide (PULMICORT) 0.25 MG/2ML nebulizer solution, Take 2 mLs (0.25 mg total) by nebulization in the morning and at bedtime., Disp: 60 mL, Rfl: 12 .  diltiazem (CARDIZEM CD) 360 MG 24 hr capsule, TAKE ONE CAPSULE by mouth EVERY DAY, Disp: 90 capsule, Rfl: 0 .  losartan (COZAAR) 50 MG tablet, TAKE ONE TABLET by mouth EVERY EVENING,, Disp: 90  tablet, Rfl: 0 .  methocarbamol (ROBAXIN) 500 MG tablet, Take 1 tablet by mouth daily as needed for muscle spasms. , Disp: , Rfl:  .  Multiple Vitamins-Minerals (MULTIVITAMIN WITH MINERALS) tablet, Take 1 tablet by mouth daily., Disp: , Rfl:  .  potassium chloride SA (KLOR-CON) 20 MEQ tablet, TAKE 1 Tablet BY MOUTH TWICE DAILY, Disp: 60 tablet, Rfl: 2 .  predniSONE (DELTASONE) 5 MG tablet, Take 1 tablet (5 mg total) by mouth daily with breakfast., Disp: 30 tablet, Rfl: 3 .  solifenacin (VESICARE) 5 MG tablet, Take 5 mg by mouth daily., Disp: , Rfl:  .  tamsulosin (FLOMAX) 0.4 MG CAPS capsule, Take 1 capsule (0.4 mg total) by mouth daily after supper. (Patient taking differently: Take 0.4 mg by mouth at bedtime.), Disp: 30 capsule, Rfl: 0 .  traMADol (ULTRAM) 50 MG tablet, Take 50 mg by mouth 2 (two) times daily as needed for moderate pain. , Disp: , Rfl:  .  torsemide (DEMADEX) 10 MG tablet, Take 1 tablet (10 mg total) by mouth in the morning., Disp: 90 tablet, Rfl: 0  Orders Placed This Encounter  Procedures  . Basic metabolic panel  . Magnesium  . EKG 12-Lead   --Continue cardiac medications as reconciled in final medication list. --Return in about 3 months (around 12/18/2020) for Follow up, heart failure management.. Or sooner if needed. --Continue follow-up with your primary care physician regarding the management of your other chronic comorbid conditions.  Total time spent: 37 minutes.  Patient's questions and concerns were addressed to his satisfaction. He voices understanding of the instructions provided during this encounter.   This note was created using a voice recognition software as a result there may be grammatical errors inadvertently enclosed that do not reflect the nature of this encounter. Every attempt is made to correct such errors.  Rex Kras, [place], [facility]  Pager: [phone] Office: [phone]

## 2020-09-25 DIAGNOSIS — G4733 Obstructive sleep apnea (adult) (pediatric): Secondary | ICD-10-CM | POA: Diagnosis not present

## 2020-09-29 ENCOUNTER — Other Ambulatory Visit: Payer: Self-pay | Admitting: Cardiology

## 2020-09-29 DIAGNOSIS — I5032 Chronic diastolic (congestive) heart failure: Secondary | ICD-10-CM

## 2020-09-29 DIAGNOSIS — R0609 Other forms of dyspnea: Secondary | ICD-10-CM

## 2020-09-30 LAB — BASIC METABOLIC PANEL
BUN/Creatinine Ratio: 16 (ref 10–24)
BUN: 17 mg/dL (ref 8–27)
CO2: 23 mmol/L (ref 20–29)
Calcium: 9.7 mg/dL (ref 8.6–10.2)
Chloride: 103 mmol/L (ref 96–106)
Creatinine, Ser: 1.08 mg/dL (ref 0.76–1.27)
GFR calc Af Amer: 81 mL/min/{1.73_m2} (ref >59)
GFR calc non Af Amer: 70 mL/min/{1.73_m2} (ref >59)
Glucose: 97 mg/dL (ref 65–99)
Potassium: 4.1 mmol/L (ref 3.5–5.2)
Sodium: 143 mmol/L (ref 134–144)

## 2020-09-30 LAB — MAGNESIUM: Magnesium: 2.2 mg/dL (ref 1.6–2.3)

## 2020-10-06 ENCOUNTER — Ambulatory Visit (INDEPENDENT_AMBULATORY_CARE_PROVIDER_SITE_OTHER): Payer: Medicare Other | Admitting: Pulmonary Disease

## 2020-10-06 ENCOUNTER — Encounter: Payer: Self-pay | Admitting: Pulmonary Disease

## 2020-10-06 ENCOUNTER — Other Ambulatory Visit: Payer: Self-pay

## 2020-10-06 VITALS — BP 122/60 | HR 61 | Temp 97.7°F | Ht 67.0 in | Wt 234.2 lb

## 2020-10-06 DIAGNOSIS — Z9989 Dependence on other enabling machines and devices: Secondary | ICD-10-CM | POA: Diagnosis not present

## 2020-10-06 DIAGNOSIS — G4733 Obstructive sleep apnea (adult) (pediatric): Secondary | ICD-10-CM

## 2020-10-06 NOTE — Patient Instructions (Signed)
Continue CPAP use on a regular basis  Continue weight loss efforts  Continue bronchodilators -You may stop the bronchodilators for few days to see the effect -If you are feeling well without them then you can leave them off and use them just as needed  I will see you back in a year  Call with any significant concerns

## 2020-10-06 NOTE — Progress Notes (Signed)
Risks associated with not treating sleep apnea discussed              Bobby [name]    [id number]    1950-11-11  Primary Care Physician:South, [name], MD  Referring Physician: [name], [place],  [place] [address]  Chief complaint:   History of obstructive sleep apnea  HPI:  Continues to use CPAP on a regular basis Has been compliant with CPAP use  Denies any pain or discomfort Appears to be sleeping well and functioning well with CPAP Wakes up a couple of x2 use the bathroom at night  He had questions about the inspire device that we did discuss the inspire device  He is working on weight loss, watching his diet  Diagnosed with moderate obstructive sleep apnea with severe oxygen desaturations Started on auto titrating CPAP  Just has difficulty sleeping with the mask on Has been trying to use it regularly  Admits to significant dryness of his mouth in the mornings Denies headaches Has managed to lose about 40 pounds recently  Usually goes to bed about 11 PM, falls asleep easily About 3 awakenings Final wake up time about 7 AM  Reformed smoker  Outpatient Encounter Medications as of 10/06/2020  Medication Sig  . abiraterone acetate (ZYTIGA) 250 MG tablet TAKE 4 TABLETS (1,000 MG TOTAL) BY MOUTH DAILY. TAKE ON AN EMPTY STOMACH, 1 HOUR BEFORE OR 2 HOURS AFTER A MEAL.  Marland Kitchen arformoterol (BROVANA) 15 MCG/2ML NEBU Take 2 mLs (15 mcg total) by nebulization 2 (two) times daily.  Marland Kitchen aspirin EC 81 MG tablet Take 1 tablet (81 mg total) by mouth daily.  Marland Kitchen atorvastatin (LIPITOR) 40 MG tablet Take 1 tablet (40 mg total) by mouth daily.  . budesonide (PULMICORT) 0.25 MG/2ML nebulizer solution Take 2 mLs (0.25 mg total) by nebulization in the morning and at bedtime.  Marland Kitchen diltiazem (CARDIZEM CD) 360 MG 24 hr capsule TAKE ONE CAPSULE by mouth EVERY DAY  . losartan (COZAAR) 50 MG tablet TAKE ONE TABLET by mouth EVERY EVENING,  . methocarbamol (ROBAXIN) 500 MG  tablet Take 1 tablet by mouth daily as needed for muscle spasms.   . Multiple Vitamins-Minerals (MULTIVITAMIN WITH MINERALS) tablet Take 1 tablet by mouth daily.  . potassium chloride SA (KLOR-CON) 20 MEQ tablet TAKE 1 Tablet BY MOUTH TWICE DAILY  . predniSONE (DELTASONE) 5 MG tablet Take 1 tablet (5 mg total) by mouth daily with breakfast.  . solifenacin (VESICARE) 5 MG tablet Take 5 mg by mouth daily.  . tamsulosin (FLOMAX) 0.4 MG CAPS capsule Take 1 capsule (0.4 mg total) by mouth daily after supper. (Patient taking differently: Take 0.4 mg by mouth at bedtime.)  . torsemide (DEMADEX) 20 MG tablet Take 1 tablet (20 mg total) by mouth in the morning.  . traMADol (ULTRAM) 50 MG tablet Take 50 mg by mouth 2 (two) times daily as needed for moderate pain.   . [DISCONTINUED] torsemide (DEMADEX) 10 MG tablet Take 1 tablet (10 mg total) by mouth in the morning.   No facility-administered encounter medications on file as of 10/06/2020.    Allergies as of 10/06/2020  . (No Known Allergies)    Past Medical History:  Diagnosis Date  . Bronchitis   . CHF (congestive heart failure) ([facility]) 04/2020   A/C HFpEF  . Coronary artery calcification of native artery   . Family history of pancreatic cancer   . History of kidney stones   . Hyperlipidemia   .  Hypertension   . Pneumonia   . Prostate cancer [facility])     Past Surgical History:  Procedure Laterality Date  . EXTRACORPOREAL SHOCK WAVE LITHOTRIPSY Right 04/17/2017   Procedure: RIGHT EXTRACORPOREAL SHOCK WAVE LITHOTRIPSY (ESWL);  Surgeon: Kathie Rhodes, MD;  Location: [facility];  Service: Urology;  Laterality: Right;  . HERNIA REPAIR     age 5  . LEFT HEART CATH AND CORONARY ANGIOGRAPHY N/A 05/08/2020   Procedure: LEFT HEART CATH AND CORONARY ANGIOGRAPHY;  Surgeon: Adrian Prows, MD;  Location: [place] [facility];  Service: Cardiovascular;  Laterality: N/A;  . PROSTATE BIOPSY      Family History  Problem Relation Age of Onset  . Breast cancer Sister  84       "negative genetic testing"  . Pancreatic cancer Brother 4  . Breast cancer Maternal Aunt   . Kidney cancer Maternal Aunt        dx 55s    Social History   Socioeconomic History  . Marital status: Married    Spouse name: Not on file  . Number of children: 2  . Years of education: Not on file  . Highest education level: Not on file  Occupational History  . Not on file  Tobacco Use  . Smoking status: Former Smoker    Packs/day: 0.25    Years: 10.00    Pack years: 2.50    Types: Cigarettes    Quit date: 01/20/2012    Years since quitting: 8.7  . Smokeless tobacco: Never Used  Vaping Use  . Vaping Use: Never used  Substance and Sexual Activity  . Alcohol use: No  . Drug use: No  . Sexual activity: Yes  Other Topics Concern  . Not on file  Social History Narrative  . Not on file   Social Determinants of Health   Financial Resource Strain: Not on file  Food Insecurity: Not on file  Transportation Needs: Not on file  Physical Activity: Not on file  Stress: Not on file  Social Connections: Not on file  Intimate Partner Violence: Not on file    Review of Systems  Constitutional: Positive for fatigue.  Respiratory: Positive for apnea. Negative for shortness of breath.   Psychiatric/Behavioral: Positive for sleep disturbance.    Vitals:   10/06/20 1409  BP: 122/60  Pulse: 61  Temp: 97.7 F (36.5 C)  SpO2: 99%     Physical Exam Constitutional:      Appearance: He is obese.  HENT:     Head: Normocephalic and atraumatic.  Cardiovascular:     Rate and Rhythm: Normal rate and regular rhythm.     Heart sounds: No murmur heard. No friction rub.  Pulmonary:     Effort: No respiratory distress.     Breath sounds: No stridor. No wheezing or rhonchi.  Musculoskeletal:     Cervical back: No rigidity or tenderness.  Neurological:     Mental Status: He is alert.  Psychiatric:        Mood and Affect: Mood normal.    Data Reviewed: Sleep study was  reviewed by myself Compliance data reveals 100% compliance Average use of 4 hours on 40 minutes Machine set between 5 and 15 Residual AHI of 2.7   Assessment:  Moderate obstructive sleep apnea -Currently on CPAP therapy abdomen seems to be tolerating it well -We will keep using CPAP  Obesity -He continues to work on weight loss efforts and is managing to lose some weight  Obstructive lung  disease -Is on Pulmicort/Brovana  Plan/Recommendations: Encouraged to continue using CPAP at present  You may continue with bronchodilator treatments -He may elect to stop bronchodilators for a while to see the legs able to function without them  I will see him back in a year  Encouraged to call with any significant concerns  He is using CPAP on a nightly basis and appears to be benefiting from using CPAP Continue CPAP therapy  Sherrilyn Rist MD [facility] 10/06/2020, 2:34 PM  CC: [name], MD

## 2020-10-09 DIAGNOSIS — I5032 Chronic diastolic (congestive) heart failure: Secondary | ICD-10-CM | POA: Diagnosis not present

## 2020-10-10 ENCOUNTER — Other Ambulatory Visit: Payer: Self-pay | Admitting: Cardiology

## 2020-10-10 DIAGNOSIS — I10 Essential (primary) hypertension: Secondary | ICD-10-CM

## 2020-10-17 ENCOUNTER — Other Ambulatory Visit: Payer: Self-pay

## 2020-10-17 MED ORDER — PREDNISONE 5 MG PO TABS: 5.0000 mg | ORAL_TABLET | Freq: Every day | ORAL | 0 refills | Status: DC

## 2020-10-19 ENCOUNTER — Inpatient Hospital Stay: Payer: Medicare Other | Attending: Oncology

## 2020-10-19 ENCOUNTER — Inpatient Hospital Stay: Payer: Medicare Other | Admitting: Oncology

## 2020-10-19 ENCOUNTER — Other Ambulatory Visit: Payer: Self-pay

## 2020-10-19 ENCOUNTER — Inpatient Hospital Stay: Payer: Medicare Other

## 2020-10-19 VITALS — BP 142/66 | HR 73 | Temp 97.8°F | Resp 16 | Ht 67.0 in | Wt 237.5 lb

## 2020-10-19 DIAGNOSIS — C61 Malignant neoplasm of prostate: Secondary | ICD-10-CM | POA: Insufficient documentation

## 2020-10-19 DIAGNOSIS — Z5111 Encounter for antineoplastic chemotherapy: Secondary | ICD-10-CM | POA: Insufficient documentation

## 2020-10-19 DIAGNOSIS — Z79899 Other long term (current) drug therapy: Secondary | ICD-10-CM | POA: Diagnosis not present

## 2020-10-19 LAB — CMP (CANCER CENTER ONLY)
ALT: 10 U/L (ref 0–44)
AST: 10 U/L — ABNORMAL LOW (ref 15–41)
Albumin: 4 g/dL (ref 3.5–5.0)
Alkaline Phosphatase: 117 U/L (ref 38–126)
Anion gap: 10 (ref 5–15)
BUN: 20 mg/dL (ref 8–23)
CO2: 23 mmol/L (ref 22–32)
Calcium: 9.3 mg/dL (ref 8.9–10.3)
Chloride: 106 mmol/L (ref 98–111)
Creatinine: 1.18 mg/dL (ref 0.61–1.24)
GFR, Estimated: >60 mL/min (ref >60)
Glucose, Bld: 176 mg/dL — ABNORMAL HIGH (ref 70–99)
Potassium: 4 mmol/L (ref 3.5–5.1)
Sodium: 139 mmol/L (ref 135–145)
Total Bilirubin: 0.4 mg/dL (ref 0.3–1.2)
Total Protein: 7 g/dL (ref 6.5–8.1)

## 2020-10-19 LAB — CBC WITH DIFFERENTIAL (CANCER CENTER ONLY)
Abs Immature Granulocytes: 0.04 10*3/uL (ref 0.00–0.07)
Basophils Absolute: 0.1 10*3/uL (ref 0.0–0.1)
Basophils Relative: 1 %
Eosinophils Absolute: 0.2 10*3/uL (ref 0.0–0.5)
Eosinophils Relative: 2 %
HCT: 39.7 % (ref 39.0–52.0)
Hemoglobin: 13 g/dL (ref 13.0–17.0)
Immature Granulocytes: 0 %
Lymphocytes Relative: 8 %
Lymphs Abs: 0.9 10*3/uL (ref 0.7–4.0)
MCH: 26.6 pg (ref 26.0–34.0)
MCHC: 32.7 g/dL (ref 30.0–36.0)
MCV: 81.4 fL (ref 80.0–100.0)
Monocytes Absolute: 0.5 10*3/uL (ref 0.1–1.0)
Monocytes Relative: 4 %
Neutro Abs: 9 10*3/uL — ABNORMAL HIGH (ref 1.7–7.7)
Neutrophils Relative %: 85 %
Platelet Count: 228 10*3/uL (ref 150–400)
RBC: 4.88 MIL/uL (ref 4.22–5.81)
RDW: 17.2 % — ABNORMAL HIGH (ref 11.5–15.5)
WBC Count: 10.6 10*3/uL — ABNORMAL HIGH (ref 4.0–10.5)
nRBC: 0 % (ref 0.0–0.2)

## 2020-10-19 MED ORDER — LEUPROLIDE ACETATE (4 MONTH) 30 MG ~~LOC~~ KIT
30.0000 mg | PACK | Freq: Once | SUBCUTANEOUS | Status: AC
  Administered 2020-10-19: 30 mg via SUBCUTANEOUS

## 2020-10-19 NOTE — Progress Notes (Signed)
Hematology and Oncology Follow Up Visit  Bobby [name] [id number] 1951/09/01 70 y.o. 10/19/2020 1:03 PM Bobby [name], MDSouth, Bobby Main, MD   Principle Diagnosis: 70 year old man with advanced prostate cancer diagnosed in 2018. 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    Prior Therapy: He is status post prostate biopsy in August 2018.  Current therapy:  Eligard 30 mg every 4 months. Last treatment was given in October 2021 and will be repeated today.  Zytiga 1000 mg daily with prednisone 5 mg daily started in October 2018.  Interim History: Bobby [name] is here for a follow-up visit. Since the last visit, he reports no major changes in his health.  He was started on CPAP machine which have helped his breathing and sleep.  He has reported improvement in his performance status and quality of life accordingly.  He denies any complications related to Zytiga at this time.  He denies any bone pain, pathological fractures or constitutional symptoms.  His performance status remained reasonable.      Medications: Updated and reviewed. Current Outpatient Medications  Medication Sig Dispense Refill  . abiraterone acetate (ZYTIGA) 250 MG tablet TAKE 4 TABLETS (1,000 MG TOTAL) BY MOUTH DAILY. TAKE ON AN EMPTY STOMACH, 1 HOUR BEFORE OR 2 HOURS AFTER A MEAL. 120 tablet 11  . arformoterol (BROVANA) 15 MCG/2ML NEBU Take 2 mLs (15 mcg total) by nebulization 2 (two) times daily. 120 mL 6  . aspirin EC 81 MG tablet Take 1 tablet (81 mg total) by mouth daily. 90 tablet 3  . atorvastatin (LIPITOR) 40 MG tablet Take 1 tablet (40 mg total) by mouth daily. 90 tablet 3  . budesonide (PULMICORT) 0.25 MG/2ML nebulizer solution Take 2 mLs (0.25 mg total) by nebulization in the morning and at bedtime. 60 mL 12  . diltiazem (CARDIZEM CD) 360 MG 24 hr capsule TAKE ONE CAPSULE by mouth EVERY DAY 90 capsule 0  . losartan (COZAAR) 50 MG  tablet TAKE ONE TABLET by mouth EVERY EVENING, 90 tablet 0  . methocarbamol (ROBAXIN) 500 MG tablet Take 1 tablet by mouth daily as needed for muscle spasms.     . Multiple Vitamins-Minerals (MULTIVITAMIN WITH MINERALS) tablet Take 1 tablet by mouth daily.    . potassium chloride SA (KLOR-CON) 20 MEQ tablet TAKE 1 Tablet BY MOUTH TWICE DAILY 60 tablet 2  . predniSONE (DELTASONE) 5 MG tablet Take 1 tablet (5 mg total) by mouth daily with breakfast. 90 tablet 0  . solifenacin (VESICARE) 5 MG tablet Take 5 mg by mouth daily.    . tamsulosin (FLOMAX) 0.4 MG CAPS capsule Take 1 capsule (0.4 mg total) by mouth daily after supper. (Patient taking differently: Take 0.4 mg by mouth at bedtime.) 30 capsule 0  . torsemide (DEMADEX) 20 MG tablet Take 1 tablet (20 mg total) by mouth in the morning. 90 tablet 0  . traMADol (ULTRAM) 50 MG tablet Take 50 mg by mouth 2 (two) times daily as needed for moderate pain.      No current facility-administered medications for this visit.     Allergies: No Known Allergies     Physical Exam:   Blood pressure (!) 142/66, pulse 73, temperature 97.8 F (36.6 C), temperature source Tympanic, resp. rate 16, height 5\' 7"  (1.702 m), weight 237 lb 8 oz (107.7 kg), SpO2 99 %.     ECOG: 1     General appearance: Comfortable appearing without any discomfort Head:  Normocephalic without any trauma Oropharynx: Mucous membranes are moist and pink without any thrush or ulcers. Eyes: Pupils are equal and round reactive to light. Lymph nodes: No cervical, supraclavicular, inguinal or axillary lymphadenopathy.   Heart:regular rate and rhythm.  S1 and S2 without leg edema. Lung: Clear without any rhonchi or wheezes.  No dullness to percussion. Abdomin: Soft, nontender, nondistended with good bowel sounds.  No hepatosplenomegaly. Musculoskeletal: No joint deformity or effusion.  Full range of motion noted. Neurological: No deficits noted on motor, sensory and deep tendon  reflex exam. Skin: No petechial rash or dryness.  Appeared moist.               Lab Results: Lab Results  Component Value Date   WBC 9.6 06/15/2020   HGB 11.2 (L) 06/15/2020   HCT 36.1 (L) 06/15/2020   MCV 81.3 06/15/2020   PLT 341 06/15/2020     Chemistry      Component Value Date/Time   NA 143 09/29/2020 1407   NA 142 09/10/2017 1509   K 4.1 09/29/2020 1407   K 4.2 09/10/2017 1509   CL 103 09/29/2020 1407   CO2 23 09/29/2020 1407   CO2 25 09/10/2017 1509   BUN 17 09/29/2020 1407   BUN 19.4 09/10/2017 1509   CREATININE 1.08 09/29/2020 1407   CREATININE 1.07 06/15/2020 0834   CREATININE 1.2 09/10/2017 1509      Component Value Date/Time   CALCIUM 9.7 09/29/2020 1407   CALCIUM 9.7 09/10/2017 1509   ALKPHOS 103 06/15/2020 0834   ALKPHOS 84 09/10/2017 1509   AST 12 (L) 06/15/2020 0834   AST 14 09/10/2017 1509   ALT 16 06/15/2020 0834   ALT 18 09/10/2017 1509   BILITOT 0.4 06/15/2020 0834   BILITOT 0.38 09/10/2017 1509        Results for Bobby Seal BENSON JR. "Bobby [name]" (MRN [id number]) as of 10/19/2020 13:05  Ref. Range 06/15/2020 08:34  Prostate Specific Ag, Serum Latest Ref Range: 0.0 - 4.0 ng/mL <0.1         Impression and Plan:  70 year old man with:  1. Advanced prostate cancer with lymphadenopathy diagnosed in 2018. 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  The natural course of his disease and treatment options were reiterated at this time. He continues to be on Zytiga with excellent PSA response is undetectable at this time. Risks and benefits of continuing this treatment were reviewed. Alternative treatment options including systemic chemotherapy will be deferred unless he has castration-resistant disease.  He is agreeable to continue at this time.   2.  Androgen deprivation therapy: Risks and benefits of continuing this treatment were discussed at this time. He will receive Eligard today and repeated in 4 months. Complication  of bleeding weight gain, hot flashes among others were reviewed.  3.  Hypertension: His blood pressure is within normal range between visits.  Mildly elevated today but otherwise no changes recommended.  4.  Electrolyte and liver function test monitoring: Remains within normal range at this time and will monitor on Zytiga.  5.  Prognosis and goals of care: His disease is incurable although aspirates are warranted given his reasonable performance status.  6.  Follow-up: He will return in 4 months for a follow-up evaluation.  30  minutes were spent on this visit. Time was dedicated to reviewing disease status, discussing treatment options and future plan of care discussion.    Zola Button, MD 2/10/20221:03 PM

## 2020-10-19 NOTE — Patient Instructions (Signed)

## 2020-10-20 ENCOUNTER — Telehealth: Payer: Self-pay

## 2020-10-20 LAB — PROSTATE-SPECIFIC AG, SERUM (LABCORP): Prostate Specific Ag, Serum: <0.1 ng/mL (ref 0.0–4.0)

## 2020-10-20 NOTE — Telephone Encounter (Signed)
Called patient and made him aware of PSA result. Patient verbalized understanding.  

## 2020-10-20 NOTE — Telephone Encounter (Signed)
-----   Message from Wyatt Portela, MD sent at 10/20/2020  8:34 AM EST ----- Please let him know his PSA is still low

## 2020-10-23 ENCOUNTER — Telehealth: Payer: Self-pay | Admitting: Pulmonary Disease

## 2020-10-23 NOTE — Telephone Encounter (Signed)
Last ov note has been faxed to [facility]. Received successful fax confirmation.  Samantha with [facility] is aware and voiced her understanding.  Nothing further needed.

## 2020-10-26 DIAGNOSIS — G4733 Obstructive sleep apnea (adult) (pediatric): Secondary | ICD-10-CM | POA: Diagnosis not present

## 2020-11-01 ENCOUNTER — Ambulatory Visit: Payer: Medicare HMO | Admitting: Cardiology

## 2020-11-09 ENCOUNTER — Telehealth: Payer: Self-pay | Admitting: Pulmonary Disease

## 2020-11-09 DIAGNOSIS — I5032 Chronic diastolic (congestive) heart failure: Secondary | ICD-10-CM | POA: Diagnosis not present

## 2020-11-09 NOTE — Telephone Encounter (Signed)
Call returned to [facility], confirmed patient DOB. Requesting OV notes from 1/28. Patient has been set up on cpap machine. Confirmed fax number. Notes faxed. Nothing further needed at this time.

## 2020-11-22 ENCOUNTER — Other Ambulatory Visit: Payer: Self-pay

## 2020-11-22 DIAGNOSIS — E876 Hypokalemia: Secondary | ICD-10-CM

## 2020-11-22 MED ORDER — POTASSIUM CHLORIDE CRYS ER 20 MEQ PO TBCR: 20.0000 meq | EXTENDED_RELEASE_TABLET | Freq: Two times a day (BID) | ORAL | 2 refills | Status: DC

## 2020-11-23 ENCOUNTER — Other Ambulatory Visit: Payer: Self-pay | Admitting: Cardiology

## 2020-11-23 DIAGNOSIS — G4733 Obstructive sleep apnea (adult) (pediatric): Secondary | ICD-10-CM | POA: Diagnosis not present

## 2020-11-29 ENCOUNTER — Other Ambulatory Visit: Payer: Self-pay | Admitting: Pulmonary Disease

## 2020-11-29 MED ORDER — ARFORMOTEROL TARTRATE 15 MCG/2ML IN NEBU: 15.0000 ug | INHALATION_SOLUTION | Freq: Two times a day (BID) | RESPIRATORY_TRACT | 6 refills | Status: DC

## 2020-11-30 DIAGNOSIS — G4733 Obstructive sleep apnea (adult) (pediatric): Secondary | ICD-10-CM | POA: Diagnosis not present

## 2020-12-05 ENCOUNTER — Other Ambulatory Visit (HOSPITAL_COMMUNITY): Payer: Self-pay

## 2020-12-07 DIAGNOSIS — G894 Chronic pain syndrome: Secondary | ICD-10-CM | POA: Diagnosis not present

## 2020-12-07 DIAGNOSIS — Z79899 Other long term (current) drug therapy: Secondary | ICD-10-CM | POA: Diagnosis not present

## 2020-12-07 DIAGNOSIS — Z79891 Long term (current) use of opiate analgesic: Secondary | ICD-10-CM | POA: Diagnosis not present

## 2020-12-09 DIAGNOSIS — I5032 Chronic diastolic (congestive) heart failure: Secondary | ICD-10-CM | POA: Diagnosis not present

## 2020-12-12 ENCOUNTER — Other Ambulatory Visit: Payer: Self-pay | Admitting: Cardiology

## 2020-12-12 DIAGNOSIS — R0609 Other forms of dyspnea: Secondary | ICD-10-CM

## 2020-12-12 DIAGNOSIS — R0600 Dyspnea, unspecified: Secondary | ICD-10-CM

## 2020-12-12 DIAGNOSIS — I5032 Chronic diastolic (congestive) heart failure: Secondary | ICD-10-CM

## 2020-12-18 NOTE — Progress Notes (Signed)
ID:  Bobby Forster., DOB Oct 26, 1950, MRN [id number]  PCP:  [name], MD  Cardiologist: Rex Kras, DO, [facility] (established care 01/21/2020)  Date: 12/19/20 Last Office Visit: 09/19/2020  Chief Complaint  Patient presents with  . Chronic heart failure with preserved ejection fraction   . Follow-up    3 month     Bobby Appleyard. is a 70 y.o. male who presents to the office with a  chief complaint of " 25-month follow-up for congestive heart failure."  His past medical history and cardiovascular risk factors are: Nonobstructive coronary artery disease, severe coronary artery calcification 1101 AU, chronic HFpEF/stage C/NYHA class II, prediabetes, hyperlipidemia, hypertension, advanced age, former smoker, obesity due to excess calories.  Patient is accompanied today by his wife Bobby [name].   Patient was originally referred to the office for evaluation of  coronary artery calcification.   Given the coronary artery calcification and dyspnea on exertion patient underwent an ischemic evaluation including a coronary CTA results and thereafter underwent left heart catheterization during one of his subsequent hospitalizations results noted below.    Since then he is symptoms have improved due to up titration of guideline directed medical therapy.  He presents now for 42-month follow-up.  Patient denies any chest pain or anginal equivalent.  He continues to have effort related dyspnea but this is chronic and stable.  He has gained 6 pounds since last office visit due to both increased caloric intake and lower extremity swelling.  No recent hospitalizations or urgent care visits for cardiovascular symptoms.  He continues to use his CPAP on a daily basis and feels great.  Patient states that the potassium supplements were recently changed so that are easier to swallow.  How ever the capsules tend not to disintegrate and he sees them in his bowel movements.  He does not know exactly how  much in which potassium supplements currently taking I have asked him to call the office back to better reconcile the medications.  FUNCTIONAL STATUS: Walking more with grandkids, as per patient's wife.  ALLERGIES: No Known Allergies  MEDICATION LIST PRIOR TO VISIT: Current Meds  Medication Sig  . abiraterone acetate (ZYTIGA) 250 MG tablet TAKE 4 TABLETS (1,000 MG TOTAL) BY MOUTH DAILY. TAKE ON AN EMPTY STOMACH, 1 HOUR BEFORE OR 2 HOURS AFTER A MEAL. (Patient taking differently: Take by mouth daily. Take on an empty stomach 1 hour before or 2 hours after a meal.)  . arformoterol (BROVANA) 15 MCG/2ML NEBU Take 2 mLs (15 mcg total) by nebulization 2 (two) times daily.  Marland Kitchen aspirin EC 81 MG tablet Take 1 tablet (81 mg total) by mouth daily.  Marland Kitchen atorvastatin (LIPITOR) 40 MG tablet Take 1 tablet (40 mg total) by mouth daily.  . budesonide (PULMICORT) 0.25 MG/2ML nebulizer solution Take 2 mLs (0.25 mg total) by nebulization in the morning and at bedtime.  Marland Kitchen diltiazem (CARDIZEM CD) 360 MG 24 hr capsule TAKE 1 Capsule BY MOUTH ONCE DAILY  . methocarbamol (ROBAXIN) 500 MG tablet Take 1 tablet by mouth daily as needed for muscle spasms.   . Multiple Vitamins-Minerals (MULTIVITAMIN WITH MINERALS) tablet Take 1 tablet by mouth daily.  . potassium chloride (KLOR-CON) 10 MEQ tablet TAKE TWO TABLETS by mouth TWICE DAILY  . predniSONE (DELTASONE) 5 MG tablet Take 1 tablet (5 mg total) by mouth daily with breakfast.  . sacubitril-valsartan (ENTRESTO) 49-51 MG Take 1 tablet by mouth 2 (two) times daily.  . solifenacin (VESICARE) 5 MG tablet  Take 5 mg by mouth daily.  . tamsulosin (FLOMAX) 0.4 MG CAPS capsule Take 1 capsule (0.4 mg total) by mouth daily after supper. (Patient taking differently: Take 0.4 mg by mouth at bedtime.)  . torsemide (DEMADEX) 20 MG tablet TAKE ONE TABLET by mouth EVERY MORNING  . traMADol (ULTRAM) 50 MG tablet Take 50 mg by mouth 2 (two) times daily as needed for moderate pain.   .  [DISCONTINUED] losartan (COZAAR) 50 MG tablet TAKE ONE TABLET by mouth EVERY EVENING,     PAST MEDICAL HISTORY: Past Medical History:  Diagnosis Date  . Bronchitis   . CHF (congestive heart failure) ([place]) 04/2020   A/C HFpEF  . Coronary artery calcification of native artery   . Family history of pancreatic cancer   . History of kidney stones   . Hyperlipidemia   . Hypertension   . Pneumonia   . Prostate cancer ([place])     PAST SURGICAL HISTORY: Past Surgical History:  Procedure Laterality Date  . EXTRACORPOREAL SHOCK WAVE LITHOTRIPSY Right 04/17/2017   Procedure: RIGHT EXTRACORPOREAL SHOCK WAVE LITHOTRIPSY (ESWL);  Surgeon: Kathie Rhodes, MD;  Location: [facility];  Service: Urology;  Laterality: Right;  . HERNIA REPAIR     age 49  . LEFT HEART CATH AND CORONARY ANGIOGRAPHY N/A 05/08/2020   Procedure: LEFT HEART CATH AND CORONARY ANGIOGRAPHY;  Surgeon: Adrian Prows, MD;  Location: [facility];  Service: Cardiovascular;  Laterality: N/A;  . PROSTATE BIOPSY      FAMILY HISTORY: The patient family history includes Breast cancer in his maternal aunt; Breast cancer (age of onset: 58) in his sister; Kidney cancer in his maternal aunt; Pancreatic cancer (age of onset: 92) in his brother.  SOCIAL HISTORY:  The patient  reports that he quit smoking about 8 years ago. His smoking use included cigarettes. He has a 2.50 pack-year smoking history. He has never used smokeless tobacco. He reports that he does not drink alcohol and does not use drugs.  REVIEW OF SYSTEMS: Review of Systems  Constitutional: Positive for weight gain. Negative for chills, fever and malaise/fatigue.  HENT: Negative for hoarse voice and nosebleeds.   Eyes: Negative for discharge, double vision and pain.  Cardiovascular: Positive for dyspnea on exertion (improving). Negative for chest pain, claudication, leg swelling, near-syncope, orthopnea, palpitations, paroxysmal nocturnal dyspnea and syncope.  Respiratory:  Positive for shortness of breath (improving). Negative for hemoptysis.   Musculoskeletal: Negative for muscle cramps and myalgias.  Gastrointestinal: Negative for abdominal pain, constipation, diarrhea, hematemesis, hematochezia, melena, nausea and vomiting.  Neurological: Negative for dizziness and light-headedness.   PHYSICAL EXAM: Vitals with BMI 12/19/2020 10/19/2020 10/06/2020  Height 5\' 7"  5\' 7"  5\' 7"   Weight 243 lbs 5 oz 237 lbs 8 oz 234 lbs 3 oz  BMI 38.1 43.32 95.18  Systolic 841 660 630  Diastolic 73 66 60  Pulse 69 73 61   CONSTITUTIONAL: Well-developed and well-nourished. No acute distress.  SKIN: Skin is warm and dry. No rash noted. No cyanosis. No pallor. No jaundice HEAD: Normocephalic and atraumatic.  EYES: No scleral icterus MOUTH/THROAT: Moist oral membranes.  NECK: No JVD present. No thyromegaly noted. No carotid bruits  LYMPHATIC: No visible cervical adenopathy.  CHEST Normal respiratory effort. No intercostal retractions  LUNGS: Clear to auscultation bilaterally. No stridor. No wheezes. No rales.  CARDIOVASCULAR: Regular rate and rhythm, positive S1-S2, no murmurs rubs or gallops appreciated. ABDOMINAL: Obese, soft, nontender, nondistended, positive bowel sounds all 4 quadrants. No apparent ascites.  EXTREMITIES: No bilateral pitting peripheral edema  HEMATOLOGIC: No significant bruising NEUROLOGIC: Oriented to person, place, and time. Nonfocal. Normal muscle tone.  PSYCHIATRIC: Normal mood and affect. Normal behavior. Cooperative  RADIOLOGY: CT angio chest PE with and without contrast September 02, 2019:Cardiovascular: There is mild cardiomegaly. No pericardial effusion. There is multi vessel coronary vascular calcification. There is mild atherosclerotic calcification of the thoracic aorta.  CARDIAC DATABASE: EKG: 09/20/2020: Sinus  Rhythm, 83bpm, first degree A-V block, nonspecific ST depression, without underlying injury pattern, no significant change compared  to prior.   Echocardiogram: 05/06/2020: LVEF 60-65%, hyperdynamic LVEF, moderate LVH, grade 2 diastolic impairment, elevated LVEDP, RV systolic function and size are within normal limits, trivial TR, trivial MR, mild to moderate aortic valve sclerosis without stenosis.  Stress Testing: Lexiscan/modified Bruce Tetrofosmin stress test 02/23/2020: Stress EKG showed sinus tachycardia, inferolateral T wave inversion.  SPECT images show small sized, medium intensity, mid to basal inferolateral perfusion defect with mild reversibility. Stress LVEF 82%. Low risk study.  Coronary CTA 04/26/2020: 1. Coronary calcium score of 1101. This was 89th percentile for age and sex matched control. 2. Normal coronary origin with right dominance. 3.  Minimal calcified plaque within the left main artery. Moderate to severe stenosis within the proximal/mid LAD segment to calcified plaque. Severe stenosis at the ostial segment of the 1st diagonal branch due calcified plaque. Severe stenosis within the proximal segment due to eccentric calcified plaque within the LCX. Moderate stenosis within the distal RCA due to calcified plaque. Of note, severity of the stenosis may be overestimated due to blooming artifact caused by calcified plaque. 4. CADRADS = 4. Cardiac catheterization or CT FFR is recommended. Consider symptom-guided anti-ischemic pharmacotherapy as well as risk factor modification per guideline directed care. Invasive coronary angiography recommended with revascularization per published guideline statements. 4. Study is sent for CT-FFR findings will be performed and reported   CT-FFR 04/27/2020: CT FFR analysis showed no significant stenosis.  Heart Catheterization: Left Heart Catheterization 05/08/20:  Hyperdynamic LVEF, EF 65 to 70%.  Mildly elevated LV EDP. Mild diffuse coronary artery disease with mild coronary calcification involving LAD, circumflex and dominant RCA.  LAD is very small and gives  origin to a very large LAD: D1 which reaches the apex. Recommendation: Patient symptoms are related to acute diastolic heart failure with elevated LVEDP.  LABORATORY DATA: CBC Latest Ref Rng & Units 10/19/2020 06/15/2020 05/11/2020  WBC 4.0 - 10.5 K/uL 10.6(H) 9.6 13.0(H)  Hemoglobin 13.0 - 17.0 g/dL 13.0 11.2(L) 11.6(L)  Hematocrit 39.0 - 52.0 % 39.7 36.1(L) 36.8(L)  Platelets 150 - 400 K/uL 228 341 365    CMP Latest Ref Rng & Units 10/19/2020 09/29/2020 09/18/2020  Glucose 70 - 99 mg/dL 176(H) 97 139(H)  BUN 8 - 23 mg/dL 20 17 20   Creatinine 0.61 - 1.24 mg/dL 1.18 1.08 1.14  Sodium 135 - 145 mmol/L 139 143 139  Potassium 3.5 - 5.1 mmol/L 4.0 4.1 3.4(L)  Chloride 98 - 111 mmol/L 106 103 99  CO2 22 - 32 mmol/L 23 23 25   Calcium 8.9 - 10.3 mg/dL 9.3 9.7 9.9  Total Protein 6.5 - 8.1 g/dL 7.0 - -  Total Bilirubin 0.3 - 1.2 mg/dL 0.4 - -  Alkaline Phos 38 - 126 U/L 117 - -  AST 15 - 41 U/L 10(L) - -  ALT 0 - 44 U/L 10 - -   Hepatic Function Panel Recent Labs    05/06/20 0303 06/15/20 0834 10/19/20 1317  PROT 6.9 6.8  7.0  ALBUMIN 3.2* 3.2* 4.0  AST 13* 12* 10*  ALT 15 16 10   ALKPHOS 73 103 117  BILITOT 1.0 0.4 0.4   External Labs: Lipid Panel 12/30/2019: total 155, Triglycerides 167, HDL 44, LDL 78  12/30/2019: A1C 5.4% Glucose Random 115 BUN 14, Creatinine, Serum 0.9  03/19/2019: PSA normal   06/05/2018: TSH 1.000  IMPRESSION:    ICD-10-CM   1. Chronic heart failure with preserved ejection fraction (HFpEF) (HCC)  Q33.35 Basic metabolic panel    Pro b natriuretic peptide (BNP)    Magnesium    sacubitril-valsartan (ENTRESTO) 49-51 MG  2. Nonobstructive atherosclerosis of coronary artery  I25.10   3. Coronary artery calcification  I25.10    I25.84   4. DOE (dyspnea on exertion)  R06.00   5. Mixed hyperlipidemia  E78.2   6. Former smoker  Z87.891   28. Benign hypertension  I10   8. Class 2 severe obesity due to excess calories with serious comorbidity and body mass  index (BMI) of 36.0 to 36.9 in adult [facility])  E66.01    Z68.36      RECOMMENDATIONS: Bobby Sprung. is a 70 y.o. male whose past medical history and cardiac risk factors include: Nonobstructive coronary artery disease, severe coronary artery calcification 1101 AU, chronic HFpEF/stage C/NYHA class II, prediabetes, hyperlipidemia, hypertension, advanced age, former smoker, obesity due to excess calories.  Chronic heart failure preserved EF, stage C, NYHA class II:  No recent hospitalizations for congestive heart failure.  Has gained 6 pounds since last office visit.  Given his preserved LVEF with structural heart disease such as moderate LVH this shared decision was to transition him from losartan to [facility].  We will start Entresto 49/51 mg p.o. twice daily.  Prior to starting [facility] I have asked him to get blood work done to know his baseline kidney function NT proBNP and potassium levels.  After taking the [facility] for 1 week he will need repeat blood work to check his kidney function and electrolytes.  I have given him a 30-day free trial voucher to help with the expense of the medication.  However if long-term the medication is cost prohibitive we will transition back to losartan.  Patient is consuming a low-salt diet but has been less careful with caloric intake.  Over the last 1 month his average blood pressure is 128/63 with a pulse of 73  Functional status remains stable.  Blood pressure and weight scale log reviewed as the patient is currently enrolled into [facility] PCM management.  Hypokalemia: His potassium supplements were recently changed as he was unable to swallow the tablets.  Now he is on capsule formulation but he is noted capsules in his stool.  I have asked the patient to call the office back to let us know exactly which potassium supplement he is currently taking.  And get repeat blood work to be evaluate his potassium levels.  Coronary atherosclerosis due to  calcified coronary lesions in the native artery without angina pectoris:  Continue aspirin.  Continue statin therapy. LDL goal is less than 70mg  /dL.   Educated on importance of improving modifiable cardiovascular risk factors for the prevention/progression of CAD  Benign essential hypertension:  Patient's blood pressure at today's office visit is at goal.  But he is enrolled into ambulatory blood pressure monitoring and BP log reviewed.   Medications reconciled.  Low salt diet recommended.   Mixed hyperlipidemia: Continue lipitor.  Does not endorse any myalgias.  Goal LDL  is less than 70 mg/dL.  Obesity, due to excess calories: Body mass index is 38.11 kg/m. . I reviewed with the patient the importance of diet, regular physical activity/exercise, weight loss.   . Patient is educated on increasing physical activity gradually as tolerated.  With the goal of moderate intensity exercise for 30 minutes a day 5 days a week.  Former smoker: Educated on the importance of continued smoking cessation.  FINAL MEDICATION LIST END OF ENCOUNTER: Meds ordered this encounter  Medications  . sacubitril-valsartan (ENTRESTO) 49-51 MG    Sig: Take 1 tablet by mouth 2 (two) times daily.    Dispense:  60 tablet    Refill:  0    Current Outpatient Medications:  .  abiraterone acetate (ZYTIGA) 250 MG tablet, TAKE 4 TABLETS (1,000 MG TOTAL) BY MOUTH DAILY. TAKE ON AN EMPTY STOMACH, 1 HOUR BEFORE OR 2 HOURS AFTER A MEAL. (Patient taking differently: Take by mouth daily. Take on an empty stomach 1 hour before or 2 hours after a meal.), Disp: 120 tablet, Rfl: 11 .  arformoterol (BROVANA) 15 MCG/2ML NEBU, Take 2 mLs (15 mcg total) by nebulization 2 (two) times daily., Disp: 120 mL, Rfl: 6 .  aspirin EC 81 MG tablet, Take 1 tablet (81 mg total) by mouth daily., Disp: 90 tablet, Rfl: 3 .  atorvastatin (LIPITOR) 40 MG tablet, Take 1 tablet (40 mg total) by mouth daily., Disp: 90 tablet, Rfl: 3 .   budesonide (PULMICORT) 0.25 MG/2ML nebulizer solution, Take 2 mLs (0.25 mg total) by nebulization in the morning and at bedtime., Disp: 60 mL, Rfl: 12 .  diltiazem (CARDIZEM CD) 360 MG 24 hr capsule, TAKE 1 Capsule BY MOUTH ONCE DAILY, Disp: 90 capsule, Rfl: 0 .  methocarbamol (ROBAXIN) 500 MG tablet, Take 1 tablet by mouth daily as needed for muscle spasms. , Disp: , Rfl:  .  Multiple Vitamins-Minerals (MULTIVITAMIN WITH MINERALS) tablet, Take 1 tablet by mouth daily., Disp: , Rfl:  .  potassium chloride (KLOR-CON) 10 MEQ tablet, TAKE TWO TABLETS by mouth TWICE DAILY, Disp: 120 tablet, Rfl: 1 .  predniSONE (DELTASONE) 5 MG tablet, Take 1 tablet (5 mg total) by mouth daily with breakfast., Disp: 90 tablet, Rfl: 0 .  sacubitril-valsartan (ENTRESTO) 49-51 MG, Take 1 tablet by mouth 2 (two) times daily., Disp: 60 tablet, Rfl: 0 .  solifenacin (VESICARE) 5 MG tablet, Take 5 mg by mouth daily., Disp: , Rfl:  .  tamsulosin (FLOMAX) 0.4 MG CAPS capsule, Take 1 capsule (0.4 mg total) by mouth daily after supper. (Patient taking differently: Take 0.4 mg by mouth at bedtime.), Disp: 30 capsule, Rfl: 0 .  torsemide (DEMADEX) 20 MG tablet, TAKE ONE TABLET by mouth EVERY MORNING, Disp: 90 tablet, Rfl: 0 .  traMADol (ULTRAM) 50 MG tablet, Take 50 mg by mouth 2 (two) times daily as needed for moderate pain. , Disp: , Rfl:   Orders Placed This Encounter  Procedures  . Basic metabolic panel  . Pro b natriuretic peptide (BNP)  . Magnesium   --Continue cardiac medications as reconciled in final medication list. --Return in about 3 months (around 03/20/2021) for Follow up, heart failure management.. Or sooner if needed. --Continue follow-up with your primary care physician regarding the management of your other chronic comorbid conditions.  Total time spent: 29minutes.  Patient's questions and concerns were addressed to his satisfaction. He voices understanding of the instructions provided during this encounter.    This note was created using a voice recognition  software as a result there may be grammatical errors inadvertently enclosed that do not reflect the nature of this encounter. Every attempt is made to correct such errors.  Rex Kras, [place], [facility]  Pager: [phone] Office: [phone]

## 2020-12-19 ENCOUNTER — Ambulatory Visit: Payer: Medicare Other | Admitting: Cardiology

## 2020-12-19 ENCOUNTER — Other Ambulatory Visit: Payer: Self-pay

## 2020-12-19 ENCOUNTER — Telehealth: Payer: Self-pay

## 2020-12-19 ENCOUNTER — Encounter: Payer: Self-pay | Admitting: Cardiology

## 2020-12-19 VITALS — BP 138/73 | HR 69 | Temp 97.6°F | Resp 16 | Ht 67.0 in | Wt 243.3 lb

## 2020-12-19 DIAGNOSIS — I2510 Atherosclerotic heart disease of native coronary artery without angina pectoris: Secondary | ICD-10-CM

## 2020-12-19 DIAGNOSIS — R0600 Dyspnea, unspecified: Secondary | ICD-10-CM

## 2020-12-19 DIAGNOSIS — I2584 Coronary atherosclerosis due to calcified coronary lesion: Secondary | ICD-10-CM

## 2020-12-19 DIAGNOSIS — I5032 Chronic diastolic (congestive) heart failure: Secondary | ICD-10-CM

## 2020-12-19 DIAGNOSIS — R0609 Other forms of dyspnea: Secondary | ICD-10-CM | POA: Diagnosis not present

## 2020-12-19 DIAGNOSIS — E782 Mixed hyperlipidemia: Secondary | ICD-10-CM

## 2020-12-19 DIAGNOSIS — Z87891 Personal history of nicotine dependence: Secondary | ICD-10-CM

## 2020-12-19 DIAGNOSIS — I10 Essential (primary) hypertension: Secondary | ICD-10-CM

## 2020-12-19 MED ORDER — ENTRESTO 49-51 MG PO TABS
1.0000 | ORAL_TABLET | Freq: Two times a day (BID) | ORAL | 0 refills | Status: DC
Start: 2020-12-19 — End: 2021-01-18

## 2020-12-19 NOTE — Telephone Encounter (Signed)
Yes, they are both correct

## 2020-12-19 NOTE — Telephone Encounter (Signed)
Pts wife called to confirm his potassium chloride, it is accurate to what is already in the chart.

## 2020-12-19 NOTE — Telephone Encounter (Signed)
You double checked the dose and frequency, right?

## 2020-12-20 DIAGNOSIS — I5032 Chronic diastolic (congestive) heart failure: Secondary | ICD-10-CM | POA: Diagnosis not present

## 2020-12-21 ENCOUNTER — Other Ambulatory Visit: Payer: Self-pay | Admitting: Cardiology

## 2020-12-21 DIAGNOSIS — I5032 Chronic diastolic (congestive) heart failure: Secondary | ICD-10-CM

## 2020-12-21 LAB — MAGNESIUM: Magnesium: 2.3 mg/dL (ref 1.6–2.3)

## 2020-12-21 LAB — BASIC METABOLIC PANEL
BUN/Creatinine Ratio: 15 (ref 10–24)
BUN: 17 mg/dL (ref 8–27)
CO2: 23 mmol/L (ref 20–29)
Calcium: 9.7 mg/dL (ref 8.6–10.2)
Chloride: 101 mmol/L (ref 96–106)
Creatinine, Ser: 1.1 mg/dL (ref 0.76–1.27)
Glucose: 114 mg/dL — ABNORMAL HIGH (ref 65–99)
Potassium: 4.4 mmol/L (ref 3.5–5.2)
Sodium: 140 mmol/L (ref 134–144)
eGFR: 73 mL/min/{1.73_m2} (ref >59)

## 2020-12-21 LAB — PRO B NATRIURETIC PEPTIDE: NT-Pro BNP: 321 pg/mL (ref 0–376)

## 2020-12-21 NOTE — Progress Notes (Signed)
Have you added the blood work or do you need me to add orders let me know

## 2020-12-21 NOTE — Progress Notes (Signed)
Patient is aware he said he would like to continue as he is and no changes

## 2020-12-23 ENCOUNTER — Other Ambulatory Visit: Payer: Self-pay | Admitting: Cardiology

## 2020-12-23 DIAGNOSIS — I5032 Chronic diastolic (congestive) heart failure: Secondary | ICD-10-CM

## 2020-12-24 DIAGNOSIS — G4733 Obstructive sleep apnea (adult) (pediatric): Secondary | ICD-10-CM | POA: Diagnosis not present

## 2020-12-27 ENCOUNTER — Other Ambulatory Visit: Payer: Self-pay

## 2020-12-27 DIAGNOSIS — I5032 Chronic diastolic (congestive) heart failure: Secondary | ICD-10-CM

## 2020-12-27 DIAGNOSIS — I10 Essential (primary) hypertension: Secondary | ICD-10-CM

## 2020-12-27 DIAGNOSIS — R0600 Dyspnea, unspecified: Secondary | ICD-10-CM

## 2020-12-27 DIAGNOSIS — I2510 Atherosclerotic heart disease of native coronary artery without angina pectoris: Secondary | ICD-10-CM

## 2020-12-27 DIAGNOSIS — E782 Mixed hyperlipidemia: Secondary | ICD-10-CM

## 2020-12-27 DIAGNOSIS — I2584 Coronary atherosclerosis due to calcified coronary lesion: Secondary | ICD-10-CM

## 2020-12-27 DIAGNOSIS — R0609 Other forms of dyspnea: Secondary | ICD-10-CM

## 2020-12-29 ENCOUNTER — Other Ambulatory Visit: Payer: Self-pay | Admitting: *Deleted

## 2020-12-29 DIAGNOSIS — C61 Malignant neoplasm of prostate: Secondary | ICD-10-CM

## 2020-12-29 DIAGNOSIS — I5032 Chronic diastolic (congestive) heart failure: Secondary | ICD-10-CM | POA: Diagnosis not present

## 2020-12-29 DIAGNOSIS — I2584 Coronary atherosclerosis due to calcified coronary lesion: Secondary | ICD-10-CM | POA: Diagnosis not present

## 2020-12-29 DIAGNOSIS — I10 Essential (primary) hypertension: Secondary | ICD-10-CM | POA: Diagnosis not present

## 2020-12-29 DIAGNOSIS — I2510 Atherosclerotic heart disease of native coronary artery without angina pectoris: Secondary | ICD-10-CM | POA: Diagnosis not present

## 2020-12-30 LAB — BASIC METABOLIC PANEL
BUN/Creatinine Ratio: 18 (ref 10–24)
BUN: 22 mg/dL (ref 8–27)
CO2: 23 mmol/L (ref 20–29)
Calcium: 9.6 mg/dL (ref 8.6–10.2)
Chloride: 104 mmol/L (ref 96–106)
Creatinine, Ser: 1.25 mg/dL (ref 0.76–1.27)
Glucose: 185 mg/dL — ABNORMAL HIGH (ref 65–99)
Potassium: 4.6 mmol/L (ref 3.5–5.2)
Sodium: 145 mmol/L — ABNORMAL HIGH (ref 134–144)
eGFR: 63 mL/min/{1.73_m2} (ref >59)

## 2020-12-30 LAB — BRAIN NATRIURETIC PEPTIDE: BNP: 119.9 pg/mL — ABNORMAL HIGH (ref 0.0–100.0)

## 2020-12-30 LAB — MAGNESIUM: Magnesium: 2.3 mg/dL (ref 1.6–2.3)

## 2021-01-03 DIAGNOSIS — M5459 Other low back pain: Secondary | ICD-10-CM | POA: Diagnosis not present

## 2021-01-03 DIAGNOSIS — G894 Chronic pain syndrome: Secondary | ICD-10-CM | POA: Diagnosis not present

## 2021-01-03 DIAGNOSIS — M4727 Other spondylosis with radiculopathy, lumbosacral region: Secondary | ICD-10-CM | POA: Diagnosis not present

## 2021-01-08 DIAGNOSIS — I5032 Chronic diastolic (congestive) heart failure: Secondary | ICD-10-CM | POA: Diagnosis not present

## 2021-01-10 DIAGNOSIS — M4727 Other spondylosis with radiculopathy, lumbosacral region: Secondary | ICD-10-CM | POA: Diagnosis not present

## 2021-01-11 ENCOUNTER — Other Ambulatory Visit: Payer: Self-pay | Admitting: Cardiology

## 2021-01-16 ENCOUNTER — Other Ambulatory Visit: Payer: Self-pay | Admitting: Cardiology

## 2021-01-16 DIAGNOSIS — R0609 Other forms of dyspnea: Secondary | ICD-10-CM

## 2021-01-16 DIAGNOSIS — I5032 Chronic diastolic (congestive) heart failure: Secondary | ICD-10-CM

## 2021-01-16 DIAGNOSIS — R0600 Dyspnea, unspecified: Secondary | ICD-10-CM

## 2021-01-18 ENCOUNTER — Other Ambulatory Visit: Payer: Self-pay

## 2021-01-18 DIAGNOSIS — I5032 Chronic diastolic (congestive) heart failure: Secondary | ICD-10-CM

## 2021-01-18 MED ORDER — ENTRESTO 49-51 MG PO TABS: 1.0000 | ORAL_TABLET | Freq: Two times a day (BID) | ORAL | 0 refills | Status: DC

## 2021-01-26 ENCOUNTER — Telehealth: Payer: Self-pay | Admitting: *Deleted

## 2021-01-26 ENCOUNTER — Telehealth: Payer: Self-pay | Admitting: Pulmonary Disease

## 2021-01-26 ENCOUNTER — Other Ambulatory Visit: Payer: Self-pay

## 2021-01-26 DIAGNOSIS — I5032 Chronic diastolic (congestive) heart failure: Secondary | ICD-10-CM

## 2021-01-26 MED ORDER — ENTRESTO 49-51 MG PO TABS: 1.0000 | ORAL_TABLET | Freq: Two times a day (BID) | ORAL | 3 refills | Status: AC

## 2021-01-26 NOTE — Telephone Encounter (Signed)
Primary Pulmonologist: [place] Last office visit and with whom: 10/06/2020 [facility] What do we see them for (pulmonary problems): OSA, SOB Last OV assessment/plan:  Assessment:  Moderate obstructive sleep apnea -Currently on CPAP therapy abdomen seems to be tolerating it well -We will keep using CPAP  Obesity -He continues to work on weight loss efforts and is managing to lose some weight  Obstructive lung disease -Is on Pulmicort/Brovana  Plan/Recommendations: Encouraged to continue using CPAP at present  You may continue with bronchodilator treatments -He may elect to stop bronchodilators for a while to see the legs able to function without them  I will see him back in a year  Encouraged to call with any significant concerns  He is using CPAP on a nightly basis and appears to be benefiting from using CPAP Continue CPAP therapy  Sherrilyn Rist MD [facility] 10/06/2020, 2:34 PM  CC: [name], MD        Patient Instructions by Laurin Coder, MD at 10/06/2020 2:15 PM  Author: Laurin Coder, MD Author Type: Physician Filed: 10/06/2020 2:33 PM  Note Status: Signed Cosign: Cosign Not Required Encounter Date: 10/06/2020  Editor: Laurin Coder, MD (Physician)               Continue CPAP use on a regular basis  Continue weight loss efforts  Continue bronchodilators -You may stop the bronchodilators for few days to see the effect -If you are feeling well without them then you can leave them off and use them just as needed  I will see you back in a year  Call with any significant concerns        Instructions  Continue CPAP use on a regular basis  Continue weight loss efforts  Continue bronchodilators -You may stop the bronchodilators for few days to see the effect -If you are feeling well without them then you can leave them off and use them just as needed  I will see you back in a year  Call  with any significant concerns       Reason for call: SOB, cough and chest congestion and nasal congestion for 3 weeks.  Clear mucous.  Denies any fever, chills or body aches. Denies any wheezing.  No oxygen at home.  Oxygen levels have been 97% and above.  Able to wear CPAP most of the time at night.  Took Benadryl at night, tried delsym and robitussin with little relief.  Has not tried any musinex D for the congestion.  Denies any lose of taste and smell.  No sick contacts.  Up to date on covid and flu vaccines.  Using Brovana and Pumicort nebulizers that only relieves for 2 hours.  Patient is on 5 mg of prednisone daily.  Dr. Erin Fulling, please advise.  Thank you.  (examples of things to ask: : When did symptoms start? Fever? Cough? Productive? Color to sputum? More sputum than usual? Wheezing? Have you needed increased oxygen? Are you taking your respiratory medications? What over the counter measures have you tried?)  No Known Allergies  Immunization History  Administered Date(s) Administered  . Influenza, High Dose Seasonal PF 05/26/2019  . Influenza,inj,Quad PF,6+ Mos 04/28/2020  . Influenza-Unspecified 06/04/2018  . Moderna Sars-Covid-2 Vaccination 09/29/2019, 11/09/2019, 04/28/2020

## 2021-01-26 NOTE — Telephone Encounter (Signed)
Called and spoke with patient, advised of recommendations per Dr. Erin Fulling.  He verbalized understanding.  [facility] uses [facility] [phone], located at [address] [place], [place] [address].  This pharmacy cannot be pulled up in the [place] pharmacy look up.  Scripts for prednisone taper and doxycycline called into the pharmacy, spoke with Hailey.  Nothing further needed.

## 2021-01-26 NOTE — Telephone Encounter (Signed)
Please start patient on prednisone taper: 40mg  daily x 3 days 30mg  daily x 3 days 20mg  daily x 3 days 10mg  daily x 3 days  Then resume his home dose of 5mg  daily.   Send in a course of doxycycline 100mg  BID x 7 days.  Thanks, Wille Glaser

## 2021-01-30 NOTE — Telephone Encounter (Signed)
Will forward to triage to try to call pt again today.

## 2021-01-30 NOTE — Telephone Encounter (Signed)
Heather please close this encounter once you've completed the incomplete note. Thanks!

## 2021-01-30 NOTE — Telephone Encounter (Signed)
Called and spoke with pt letting him know that we had spoken to Dr. Erin Fulling last week about him calling not feeling well and that Dr. Erin Fulling wanted [place] to send Rx for doxy abx and pred taper and pt said that he did receive meds. Nothing further needed.

## 2021-01-30 NOTE — Telephone Encounter (Signed)
lmtcb for pt.  

## 2021-02-07 DIAGNOSIS — I5032 Chronic diastolic (congestive) heart failure: Secondary | ICD-10-CM | POA: Diagnosis not present

## 2021-02-09 DIAGNOSIS — S8262XA Displaced fracture of lateral malleolus of left fibula, initial encounter for closed fracture: Secondary | ICD-10-CM | POA: Diagnosis not present

## 2021-02-12 DIAGNOSIS — S82892A Other fracture of left lower leg, initial encounter for closed fracture: Secondary | ICD-10-CM | POA: Diagnosis not present

## 2021-02-14 ENCOUNTER — Telehealth: Payer: Self-pay | Admitting: Oncology

## 2021-02-14 NOTE — Telephone Encounter (Signed)
Scheduled appointment per 06/08 sch msg. Patient is aware. 

## 2021-02-16 ENCOUNTER — Ambulatory Visit: Payer: Medicare Other

## 2021-02-16 ENCOUNTER — Ambulatory Visit: Payer: Medicare Other | Admitting: Oncology

## 2021-02-16 ENCOUNTER — Other Ambulatory Visit: Payer: Medicare Other

## 2021-02-19 DIAGNOSIS — S82892A Other fracture of left lower leg, initial encounter for closed fracture: Secondary | ICD-10-CM | POA: Diagnosis not present

## 2021-02-20 ENCOUNTER — Other Ambulatory Visit: Payer: Self-pay

## 2021-02-20 ENCOUNTER — Inpatient Hospital Stay: Payer: Medicare Other | Attending: Oncology

## 2021-02-20 ENCOUNTER — Inpatient Hospital Stay: Payer: Medicare Other

## 2021-02-20 ENCOUNTER — Inpatient Hospital Stay: Payer: Medicare Other | Admitting: Oncology

## 2021-02-20 VITALS — BP 113/66 | HR 78 | Temp 97.8°F | Resp 17

## 2021-02-20 DIAGNOSIS — C61 Malignant neoplasm of prostate: Secondary | ICD-10-CM

## 2021-02-20 DIAGNOSIS — Z5111 Encounter for antineoplastic chemotherapy: Secondary | ICD-10-CM | POA: Insufficient documentation

## 2021-02-20 LAB — CMP (CANCER CENTER ONLY)
ALT: 11 U/L (ref 0–44)
AST: 12 U/L — ABNORMAL LOW (ref 15–41)
Albumin: 3.6 g/dL (ref 3.5–5.0)
Alkaline Phosphatase: 98 U/L (ref 38–126)
Anion gap: 11 (ref 5–15)
BUN: 27 mg/dL — ABNORMAL HIGH (ref 8–23)
CO2: 22 mmol/L (ref 22–32)
Calcium: 9.6 mg/dL (ref 8.9–10.3)
Chloride: 106 mmol/L (ref 98–111)
Creatinine: 1.19 mg/dL (ref 0.61–1.24)
GFR, Estimated: >60 mL/min (ref >60)
Glucose, Bld: 129 mg/dL — ABNORMAL HIGH (ref 70–99)
Potassium: 4.2 mmol/L (ref 3.5–5.1)
Sodium: 139 mmol/L (ref 135–145)
Total Bilirubin: 0.6 mg/dL (ref 0.3–1.2)
Total Protein: 6.8 g/dL (ref 6.5–8.1)

## 2021-02-20 LAB — CBC WITH DIFFERENTIAL (CANCER CENTER ONLY)
Abs Immature Granulocytes: 0.05 10*3/uL (ref 0.00–0.07)
Basophils Absolute: 0 10*3/uL (ref 0.0–0.1)
Basophils Relative: 0 %
Eosinophils Absolute: 0.1 10*3/uL (ref 0.0–0.5)
Eosinophils Relative: 1 %
HCT: 37 % — ABNORMAL LOW (ref 39.0–52.0)
Hemoglobin: 12.6 g/dL — ABNORMAL LOW (ref 13.0–17.0)
Immature Granulocytes: 0 %
Lymphocytes Relative: 7 %
Lymphs Abs: 0.7 10*3/uL (ref 0.7–4.0)
MCH: 28.8 pg (ref 26.0–34.0)
MCHC: 34.1 g/dL (ref 30.0–36.0)
MCV: 84.7 fL (ref 80.0–100.0)
Monocytes Absolute: 0.6 10*3/uL (ref 0.1–1.0)
Monocytes Relative: 5 %
Neutro Abs: 9.8 10*3/uL — ABNORMAL HIGH (ref 1.7–7.7)
Neutrophils Relative %: 87 %
Platelet Count: 262 10*3/uL (ref 150–400)
RBC: 4.37 MIL/uL (ref 4.22–5.81)
RDW: 17.2 % — ABNORMAL HIGH (ref 11.5–15.5)
WBC Count: 11.4 10*3/uL — ABNORMAL HIGH (ref 4.0–10.5)
nRBC: 0 % (ref 0.0–0.2)

## 2021-02-20 MED ORDER — LEUPROLIDE ACETATE (4 MONTH) 30 MG ~~LOC~~ KIT
PACK | SUBCUTANEOUS | Status: AC
  Filled 2021-02-20: qty 30

## 2021-02-20 MED ORDER — LEUPROLIDE ACETATE (4 MONTH) 30 MG ~~LOC~~ KIT
30.0000 mg | PACK | Freq: Once | SUBCUTANEOUS | Status: AC
  Administered 2021-02-20: 30 mg via SUBCUTANEOUS

## 2021-02-20 NOTE — Progress Notes (Signed)
Hematology and Oncology Follow Up Visit  Bobby [name] [id number] 02/19/1951 70 y.o. 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 He presented with Gleason score 4+5 = 9 and PSA of 14.7 at the time of diagnosis.   Prior Therapy: He is status post prostate biopsy in August 2018.  Current therapy:  Eligard 30 mg every 4 months.  He will receive Eligard today and repeated in 4 months.  Zytiga 1000 mg daily with prednisone 5 mg daily started in October 2018.  Interim History: [name] returns today for repeat evaluation.  Since the last visit, he reports no major changes in his health.  He did sustain a fracture in his left lower extremity after falling down the steps.  He is currently in a walking boot and has not had any surgery.  He is nonweightbearing with pain has improved at this time.  He denies any recent complications related to Bobby [name].  Denies any nausea, fatigue or syncope.  His performance status quality of life has been reasonable.     Medications: Reviewed without changes. Current Outpatient Medications  Medication Sig Dispense Refill   abiraterone acetate (ZYTIGA) 250 MG tablet TAKE 4 TABLETS (1,000 MG TOTAL) BY MOUTH DAILY. TAKE ON AN EMPTY STOMACH, 1 HOUR BEFORE OR 2 HOURS AFTER A MEAL. (Patient taking differently: Take by mouth daily. Take on an empty stomach 1 hour before or 2 hours after a meal.) 120 tablet 11   arformoterol (BROVANA) 15 MCG/2ML NEBU Take 2 mLs (15 mcg total) by nebulization 2 (two) times daily. 120 mL 6   aspirin EC 81 MG tablet Take 1 tablet (81 mg total) by mouth daily. 90 tablet 3   atorvastatin (LIPITOR) 40 MG tablet Take 1 tablet (40 mg total) by mouth daily. 90 tablet 3   budesonide (PULMICORT) 0.25 MG/2ML nebulizer solution Take 2 mLs (0.25 mg total) by nebulization in the morning and at bedtime. 60  mL 12   diltiazem (CARDIZEM CD) 360 MG 24 hr capsule TAKE 1 Capsule BY MOUTH ONCE DAILY 90 capsule 0   methocarbamol (ROBAXIN) 500 MG tablet Take 1 tablet by mouth daily as needed for muscle spasms.      Multiple Vitamins-Minerals (MULTIVITAMIN WITH MINERALS) tablet Take 1 tablet by mouth daily.     potassium chloride (KLOR-CON) 10 MEQ tablet TAKE TWO TABLETS by mouth TWICE DAILY 120 tablet 1   predniSONE (DELTASONE) 5 MG tablet Take 1 tablet (5 mg total) by mouth daily with breakfast. 90 tablet 0   sacubitril-valsartan (ENTRESTO) 49-51 MG Take 1 tablet by mouth 2 (two) times daily. 180 tablet 3   solifenacin (VESICARE) 5 MG tablet Take 5 mg by mouth daily.     tamsulosin (FLOMAX) 0.4 MG CAPS capsule Take 1 capsule (0.4 mg total) by mouth daily after supper. (Patient taking differently: Take 0.4 mg by mouth at bedtime.) 30 capsule 0   torsemide (DEMADEX) 20 MG tablet TAKE ONE TABLET by mouth EVERY MORNING 90 tablet 0   traMADol (ULTRAM) 50 MG tablet Take 50 mg by mouth 2 (two) times daily as needed for moderate pain.      No current facility-administered medications for this visit.     Allergies: No Known Allergies     Physical Exam:     Blood pressure 113/66, pulse 78, temperature 97.8 F (36.6 C), temperature source Oral, resp. rate 17, SpO2 97 %.  ECOG: 1    General appearance: Alert, awake without any distress. Head: Atraumatic without abnormalities Oropharynx: Without any thrush or ulcers. Eyes: No scleral icterus. Lymph nodes: No lymphadenopathy noted in the cervical, supraclavicular, or axillary nodes Heart:regular rate and rhythm, without any murmurs or gallops.   Lung: Clear to auscultation without any rhonchi, wheezes or dullness to percussion. Abdomin: Soft, nontender without any shifting dullness or ascites. Musculoskeletal: No clubbing or cyanosis. Neurological: No motor or sensory deficits. Skin: No rashes or lesions.              Lab  Results: Lab Results  Component Value Date   WBC 10.6 (H) 10/19/2020   HGB 13.0 10/19/2020   HCT 39.7 10/19/2020   MCV 81.4 10/19/2020   PLT 228 10/19/2020     Chemistry      Component Value Date/Time   NA 145 (H) 12/29/2020 1418   NA 142 09/10/2017 1509   K 4.6 12/29/2020 1418   K 4.2 09/10/2017 1509   CL 104 12/29/2020 1418   CO2 23 12/29/2020 1418   CO2 25 09/10/2017 1509   BUN 22 12/29/2020 1418   BUN 19.4 09/10/2017 1509   CREATININE 1.25 12/29/2020 1418   CREATININE 1.18 10/19/2020 1317   CREATININE 1.2 09/10/2017 1509      Component Value Date/Time   CALCIUM 9.6 12/29/2020 1418   CALCIUM 9.7 09/10/2017 1509   ALKPHOS 117 10/19/2020 1317   ALKPHOS 84 09/10/2017 1509   AST 10 (L) 10/19/2020 1317   AST 14 09/10/2017 1509   ALT 10 10/19/2020 1317   ALT 18 09/10/2017 1509   BILITOT 0.4 10/19/2020 1317   BILITOT 0.38 09/10/2017 1509             Results for Bobby [name], Bobby Bobby JR. "BILL" (MRN [id number]) as of 02/20/2021 13:19  Ref. Range 06/15/2020 08:34 10/19/2020 13:17  Prostate Specific Ag, Serum Latest Ref Range: 0.0 - 4.0 ng/mL <0.1 <0.1       Impression and Plan:  70 year old man with:   1.  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   He continues to tolerate Zytiga without any major complaints.  His PSA continues to be undetectable with previous imaging studies showed a complete response to therapy.  Risks and benefits of continuing this treatment were discussed at this time.  Potential complications that include edema, hypertension electrolyte imbalance were discussed.  Different salvage therapy with systemic chemotherapy were reviewed at this time.  He is agreeable to continue at this time.   2.  Androgen deprivation therapy: He is currently on Eligard which will be repeated every 4 months.  Complication clinic weight gain, hot flashes were reiterated.  3.  Hypertension: His blood pressure is within  normal range.  4.  Electrolyte and liver function test monitoring: We will continue to monitor electrolytes including potassium as well as elevated liver function tests associated with Zytiga.  5.  Prognosis and goals of care: Therapy remains palliative although aggressive measures are warranted given his reasonable performance status.  6.  Left lower extremity fracture: Follows with orthopedic surgery with soft cast and no surgery as of yet.  7.  Follow-up: In 4 months for repeat follow-up.   30  minutes were dedicated to this encounter.  The time was spent on reviewing laboratory data, disease status update, alternative treatment options for the future and addressing complications related to his cancer therapy.    Zola Button, MD 6/14/20221:17 PM

## 2021-02-21 ENCOUNTER — Telehealth: Payer: Self-pay | Admitting: *Deleted

## 2021-02-21 LAB — PROSTATE-SPECIFIC AG, SERUM (LABCORP): Prostate Specific Ag, Serum: <0.1 ng/mL (ref 0.0–4.0)

## 2021-02-21 NOTE — Telephone Encounter (Signed)
Contacted patient regarding test results - patient verbalized understanding

## 2021-02-21 NOTE — Telephone Encounter (Signed)
-----   Message from Wyatt Portela, MD sent at 02/21/2021  8:28 AM EDT ----- Please let him know his PSA is low

## 2021-03-05 DIAGNOSIS — S82892A Other fracture of left lower leg, initial encounter for closed fracture: Secondary | ICD-10-CM | POA: Diagnosis not present

## 2021-03-08 DIAGNOSIS — I5032 Chronic diastolic (congestive) heart failure: Secondary | ICD-10-CM | POA: Diagnosis not present

## 2021-03-20 ENCOUNTER — Ambulatory Visit: Payer: Medicare Other | Admitting: Cardiology

## 2021-03-20 ENCOUNTER — Encounter: Payer: Self-pay | Admitting: Cardiology

## 2021-03-20 ENCOUNTER — Other Ambulatory Visit: Payer: Self-pay

## 2021-03-20 VITALS — BP 118/59 | HR 71 | Temp 98.0°F | Resp 16 | Ht 67.0 in | Wt 248.0 lb

## 2021-03-20 DIAGNOSIS — I5032 Chronic diastolic (congestive) heart failure: Secondary | ICD-10-CM

## 2021-03-20 DIAGNOSIS — I2510 Atherosclerotic heart disease of native coronary artery without angina pectoris: Secondary | ICD-10-CM

## 2021-03-20 DIAGNOSIS — E66812 Obesity, class 2: Secondary | ICD-10-CM

## 2021-03-20 DIAGNOSIS — E782 Mixed hyperlipidemia: Secondary | ICD-10-CM | POA: Diagnosis not present

## 2021-03-20 DIAGNOSIS — R0609 Other forms of dyspnea: Secondary | ICD-10-CM | POA: Diagnosis not present

## 2021-03-20 DIAGNOSIS — Z6836 Body mass index (BMI) 36.0-36.9, adult: Secondary | ICD-10-CM

## 2021-03-20 DIAGNOSIS — Z87891 Personal history of nicotine dependence: Secondary | ICD-10-CM

## 2021-03-20 DIAGNOSIS — I10 Essential (primary) hypertension: Secondary | ICD-10-CM

## 2021-03-20 DIAGNOSIS — R0600 Dyspnea, unspecified: Secondary | ICD-10-CM

## 2021-03-20 MED ORDER — DAPAGLIFLOZIN PROPANEDIOL 10 MG PO TABS: 10.0000 mg | ORAL_TABLET | Freq: Every day | ORAL | 0 refills | Status: DC

## 2021-03-20 NOTE — Progress Notes (Addendum)
ID:  Bobby Forster., DOB [birth year], MRN [id number]  PCP:  [name], MD  Cardiologist: Rex Kras, DO, [facility] (established care 01/21/2020)  Date: 03/20/21 Last Office Visit: 12/19/2020  Chief Complaint  Patient presents with   Chronic heart failure with preserved ejection fraction    Follow-up    Bobby Forster. is a 70 y.o. male who presents to the office with a  chief complaint of " 53-month follow-up for heart failure management."  His past medical history and cardiovascular risk factors are: Nonobstructive coronary artery disease, severe coronary artery calcification 1101 AU, chronic HFpEF/stage C/NYHA class II, prediabetes, hyperlipidemia, hypertension, advanced age, former smoker, obesity due to excess calories.  Patient is accompanied today by his wife [name].   Patient was originally referred to the office for evaluation of  coronary artery calcification.   Patient underwent an ischemic evaluation due to effort related dyspnea and severe coronary artery calcification results of the coronary CTA and left heart catheterization noted below for further reference.  Since then patient has been uptitrated on GDMT and has noted significant improvement in his symptoms over the last several months.  He remains euvolemic and no recent hospitalizations for congestive heart failure.  At the last office visit we initiated Entresto 49/51 mg p.o. twice daily and follow-up blood work independently reviewed which notes stable kidney function and electrolytes.  Patient is here for 77-month follow-up.  Patient states that his effort related dyspnea remains stable.  He has gained approximately 5 pounds since last office visit most likely secondary to caloric intake and decreased functional activity as he broke his left leg due to a mechanical fall.  He denies any chest pain at rest or with effort related activities.  No hospitalizations or urgent care visits since last office  encounter.  FUNCTIONAL STATUS: Walking more with grandkids, as per patient's wife.  ALLERGIES: No Known Allergies  MEDICATION LIST PRIOR TO VISIT: Current Meds  Medication Sig   abiraterone acetate (ZYTIGA) 250 MG tablet TAKE 4 TABLETS (1,000 MG TOTAL) BY MOUTH DAILY. TAKE ON AN EMPTY STOMACH, 1 HOUR BEFORE OR 2 HOURS AFTER A MEAL.   arformoterol (BROVANA) 15 MCG/2ML NEBU Take 2 mLs (15 mcg total) by nebulization 2 (two) times daily.   aspirin EC 81 MG tablet Take 1 tablet (81 mg total) by mouth daily.   atorvastatin (LIPITOR) 40 MG tablet Take 1 tablet (40 mg total) by mouth daily.   budesonide (PULMICORT) 0.25 MG/2ML nebulizer solution Take 2 mLs (0.25 mg total) by nebulization in the morning and at bedtime.   dapagliflozin propanediol (FARXIGA) 10 MG TABS tablet Take 1 tablet (10 mg total) by mouth daily before breakfast.   diltiazem (CARDIZEM CD) 360 MG 24 hr capsule TAKE 1 Capsule BY MOUTH ONCE DAILY   methocarbamol (ROBAXIN) 500 MG tablet Take 1 tablet by mouth daily as needed for muscle spasms.    Multiple Vitamins-Minerals (MULTIVITAMIN WITH MINERALS) tablet Take 1 tablet by mouth daily.   potassium chloride (KLOR-CON) 10 MEQ tablet TAKE TWO TABLETS by mouth TWICE DAILY (Patient taking differently: 10 mEq daily.)   predniSONE (DELTASONE) 5 MG tablet Take 1 tablet (5 mg total) by mouth daily with breakfast.   sacubitril-valsartan (ENTRESTO) 49-51 MG Take 1 tablet by mouth 2 (two) times daily.   solifenacin (VESICARE) 5 MG tablet Take 5 mg by mouth daily.   tamsulosin (FLOMAX) 0.4 MG CAPS capsule Take 1 capsule (0.4 mg total) by mouth daily after supper. (Patient taking  differently: Take 0.4 mg by mouth at bedtime.)   traMADol (ULTRAM) 50 MG tablet Take 50 mg by mouth 2 (two) times daily as needed for moderate pain.    [DISCONTINUED] torsemide (DEMADEX) 20 MG tablet TAKE ONE TABLET by mouth EVERY MORNING     PAST MEDICAL HISTORY: Past Medical History:  Diagnosis Date   Bronchitis     CHF (congestive heart failure) ([place]) 04/2020   A/C HFpEF   Coronary artery calcification of native artery    Family history of pancreatic cancer    History of kidney stones    Hyperlipidemia    Hypertension    Pneumonia    Prostate cancer ([place])     PAST SURGICAL HISTORY: Past Surgical History:  Procedure Laterality Date   EXTRACORPOREAL SHOCK WAVE LITHOTRIPSY Right 04/17/2017   Procedure: RIGHT EXTRACORPOREAL SHOCK WAVE LITHOTRIPSY (ESWL);  Surgeon: Kathie Rhodes, MD;  Location: [facility];  Service: Urology;  Laterality: Right;   HERNIA REPAIR     age 82   LEFT HEART CATH AND CORONARY ANGIOGRAPHY N/A 05/08/2020   Procedure: LEFT HEART CATH AND CORONARY ANGIOGRAPHY;  Surgeon: Adrian Prows, MD;  Location: [facility];  Service: Cardiovascular;  Laterality: N/A;   PROSTATE BIOPSY      FAMILY HISTORY: The patient family history includes Breast cancer in his maternal aunt; Breast cancer (age of onset: 19) in his sister; Kidney cancer in his maternal aunt; Pancreatic cancer (age of onset: 32) in his brother.  SOCIAL HISTORY:  The patient  reports that he quit smoking about 9 years ago. His smoking use included cigarettes. He has a 2.50 pack-year smoking history. He has never used smokeless tobacco. He reports that he does not drink alcohol and does not use drugs.  REVIEW OF SYSTEMS: Review of Systems  Constitutional: Positive for weight gain. Negative for chills, fever and malaise/fatigue.  HENT:  Negative for hoarse voice and nosebleeds.   Eyes:  Negative for discharge, double vision and pain.  Cardiovascular:  Positive for dyspnea on exertion (improving). Negative for chest pain, claudication, leg swelling, near-syncope, orthopnea, palpitations, paroxysmal nocturnal dyspnea and syncope.  Respiratory:  Positive for shortness of breath (improving). Negative for hemoptysis.   Musculoskeletal:  Negative for muscle cramps and myalgias.  Gastrointestinal:  Negative for abdominal pain,  constipation, diarrhea, hematemesis, hematochezia, melena, nausea and vomiting.  Neurological:  Negative for dizziness and light-headedness.   PHYSICAL EXAM: Vitals with BMI 03/20/2021 02/20/2021 12/19/2020  Height 5\' 7"  - 5\' 7"   Weight 248 lbs (No Data) 243 lbs 5 oz  BMI 97.98 - 92.1  Systolic 194 174 081  Diastolic 59 66 73  Pulse 71 78 69   CONSTITUTIONAL: Well-developed and well-nourished. No acute distress.  SKIN: Skin is warm and dry. No rash noted. No cyanosis. No pallor. No jaundice HEAD: Normocephalic and atraumatic.  EYES: No scleral icterus MOUTH/THROAT: Moist oral membranes.  NECK: No JVD present. No thyromegaly noted. No carotid bruits  LYMPHATIC: No visible cervical adenopathy.  CHEST Normal respiratory effort. No intercostal retractions  LUNGS: Clear to auscultation bilaterally. No stridor. No wheezes. No rales.  CARDIOVASCULAR: Regular rate and rhythm, positive S1-S2, no murmurs rubs or gallops appreciated. ABDOMINAL: Obese, soft, nontender, nondistended, positive bowel sounds all 4 quadrants. No apparent ascites.  EXTREMITIES: No bilateral pitting peripheral edema.  Brace noted on the left lower extremity. HEMATOLOGIC: No significant bruising NEUROLOGIC: Oriented to person, place, and time. Nonfocal. Normal muscle tone.  PSYCHIATRIC: Normal mood and affect. Normal behavior. Cooperative  RADIOLOGY: CT angio chest PE with and without contrast September 02, 2019:Cardiovascular: There is mild cardiomegaly. No pericardial effusion. There is multi vessel coronary vascular calcification. There is mild atherosclerotic calcification of the thoracic aorta.  CARDIAC DATABASE: EKG: 03/20/2021: Normal sinus rhythm, 69 bpm, first-degree AV block, nonspecific T wave abnormality, occasional PVCs.  Echocardiogram: 05/06/2020: LVEF 60-65%, hyperdynamic LVEF, moderate LVH, grade 2 diastolic impairment, elevated LVEDP, RV systolic function and size are within normal limits, trivial TR,  trivial MR, mild to moderate aortic valve sclerosis without stenosis.  Stress Testing: Lexiscan/modified Bruce Tetrofosmin stress test 02/23/2020: Stress EKG showed sinus tachycardia, inferolateral T wave inversion.  SPECT images show small sized, medium intensity, mid to basal inferolateral perfusion defect with mild reversibility. Stress LVEF 82%. Low risk study.  Coronary CTA 04/26/2020: 1. Coronary calcium score of 1101. This was 89th percentile for age and sex matched control. 2. Normal coronary origin with right dominance. 3.  Minimal calcified plaque within the left main artery. Moderate to severe stenosis within the proximal/mid LAD segment to calcified plaque. Severe stenosis at the ostial segment of the 1st diagonal branch due calcified plaque. Severe stenosis within the proximal segment due to eccentric calcified plaque within the LCX. Moderate stenosis within the distal RCA due to calcified plaque. Of note, severity of the stenosis may be overestimated due to blooming artifact caused by calcified plaque. 4. CADRADS = 4. Cardiac catheterization or CT FFR is recommended. Consider symptom-guided anti-ischemic pharmacotherapy as well as risk factor modification per guideline directed care. Invasive coronary angiography recommended with revascularization per published guideline statements. 4. Study is sent for CT-FFR findings will be performed and reported   CT-FFR 04/27/2020: CT FFR analysis showed no significant stenosis.  Heart Catheterization: Left Heart Catheterization 05/08/20:  Hyperdynamic LVEF, EF 65 to 70%.  Mildly elevated LV EDP. Mild diffuse coronary artery disease with mild coronary calcification involving LAD, circumflex and dominant RCA.  LAD is very small and gives origin to a very large LAD: D1 which reaches the apex. Recommendation: Patient symptoms are related to acute diastolic heart failure with elevated LVEDP.  LABORATORY DATA: CBC Latest Ref Rng & Units  02/20/2021 10/19/2020 06/15/2020  WBC 4.0 - 10.5 K/uL 11.4(H) 10.6(H) 9.6  Hemoglobin 13.0 - 17.0 g/dL 12.6(L) 13.0 11.2(L)  Hematocrit 39.0 - 52.0 % 37.0(L) 39.7 36.1(L)  Platelets 150 - 400 K/uL 262 228 341    CMP Latest Ref Rng & Units 02/20/2021 12/29/2020 12/20/2020  Glucose 70 - 99 mg/dL 129(H) 185(H) 114(H)  BUN 8 - 23 mg/dL 27(H) 22 17  Creatinine 0.61 - 1.24 mg/dL 1.19 1.25 1.10  Sodium 135 - 145 mmol/L 139 145(H) 140  Potassium 3.5 - 5.1 mmol/L 4.2 4.6 4.4  Chloride 98 - 111 mmol/L 106 104 101  CO2 22 - 32 mmol/L 22 23 23   Calcium 8.9 - 10.3 mg/dL 9.6 9.6 9.7  Total Protein 6.5 - 8.1 g/dL 6.8 - -  Total Bilirubin 0.3 - 1.2 mg/dL 0.6 - -  Alkaline Phos 38 - 126 U/L 98 - -  AST 15 - 41 U/L 12(L) - -  ALT 0 - 44 U/L 11 - -   Hepatic Function Panel Recent Labs    06/15/20 0834 10/19/20 1317 02/20/21 1317  PROT 6.8 7.0 6.8  ALBUMIN 3.2* 4.0 3.6  AST 12* 10* 12*  ALT 16 10 11   ALKPHOS 103 117 98  BILITOT 0.4 0.4 0.6   External Labs: Lipid Panel 12/30/2019: total 155, Triglycerides 167, HDL 44, LDL  78  12/30/2019: A1C 5.4% Glucose Random 115 BUN 14, Creatinine, Serum 0.9  03/19/2019: PSA normal   06/05/2018: TSH 1.000  IMPRESSION:    ICD-10-CM   1. Chronic heart failure with preserved ejection fraction (HFpEF) (HCC)  I50.32 EKG 12-Lead    dapagliflozin propanediol (FARXIGA) 10 MG TABS tablet    Basic metabolic panel    Magnesium    Pro b natriuretic peptide (BNP)    2. Nonobstructive atherosclerosis of coronary artery  I25.10     3. Coronary artery calcification  I25.10    I25.84     4. DOE (dyspnea on exertion)  R06.00     5. Mixed hyperlipidemia  E78.2     6. Former smoker  Z87.891     28. Benign hypertension  I10     8. Class 2 severe obesity due to excess calories with serious comorbidity and body mass index (BMI) of 36.0 to 36.9 in adult [facility])  E66.01    Z68.36        RECOMMENDATIONS: Korbin Mapps. is a 70 y.o. male whose past  medical history and cardiac risk factors include: Nonobstructive coronary artery disease, severe coronary artery calcification 1101 AU, chronic HFpEF/stage C/NYHA class II, prediabetes, hyperlipidemia, hypertension, advanced age, former smoker, obesity due to excess calories.  Chronic heart failure preserved EF, stage C, NYHA class II: No recent hospitalizations for congestive heart failure. Has gained approximately 5 pounds due to increased caloric intake and decreased physical activity after mechanical fall resulting in injury to the left lower extremity. Tolerated the initiation of Entresto well without any side effects or intolerances. Repeat labs independently reviewed. Discontinue torsemide 20 mg p.o. daily. Start Farxiga 10 mg p.o. daily Provided a 7-day sample of Farxiga but will repeat blood work in 1 week.  If his kidney function remains stable a prescription has been sent to his pharmacy for him to pick up. Average blood pressure over the last 2 weeks is 115/65 with a pulse of 77 bpm as per RPM PCM management. Plan is to uptitrate GDMT in a stepwise fashion.  Hypokalemia:  Resolved Currently on potassium supplements.  Coronary atherosclerosis due to calcified coronary lesions in the native artery without angina pectoris: Continue aspirin. Continue statin therapy. LDL goal is less than 70mg  /dL.  Educated on importance of improving modifiable cardiovascular risk factors for the prevention/progression of CAD  Benign essential hypertension: Patient's blood pressure at today's office visit is at goal. But he is enrolled into ambulatory blood pressure monitoring and BP log reviewed, findings noted above.  Medications reconciled. Low salt diet recommended.   Obstructive sleep apnea on CPAP: Patient is compliant with his CPAP usage and reinforced the importance.  Mixed hyperlipidemia: Continue lipitor.  Does not endorse any myalgias.  Goal LDL is less than 70 mg/dL.  Obesity, due  to excess calories: Body mass index is 38.84 kg/m. I reviewed with the patient the importance of diet, regular physical activity/exercise, weight loss.   Patient is educated on increasing physical activity gradually as tolerated.  With the goal of moderate intensity exercise for 30 minutes a day 5 days a week.  Former smoker: Educated on the importance of continued smoking cessation.  FINAL MEDICATION LIST END OF ENCOUNTER: Meds ordered this encounter  Medications   dapagliflozin propanediol (FARXIGA) 10 MG TABS tablet    Sig: Take 1 tablet (10 mg total) by mouth daily before breakfast.    Dispense:  90 tablet    Refill:  0  Current Outpatient Medications:    abiraterone acetate (ZYTIGA) 250 MG tablet, TAKE 4 TABLETS (1,000 MG TOTAL) BY MOUTH DAILY. TAKE ON AN EMPTY STOMACH, 1 HOUR BEFORE OR 2 HOURS AFTER A MEAL., Disp: 120 tablet, Rfl: 11   arformoterol (BROVANA) 15 MCG/2ML NEBU, Take 2 mLs (15 mcg total) by nebulization 2 (two) times daily., Disp: 120 mL, Rfl: 6   aspirin EC 81 MG tablet, Take 1 tablet (81 mg total) by mouth daily., Disp: 90 tablet, Rfl: 3   atorvastatin (LIPITOR) 40 MG tablet, Take 1 tablet (40 mg total) by mouth daily., Disp: 90 tablet, Rfl: 3   budesonide (PULMICORT) 0.25 MG/2ML nebulizer solution, Take 2 mLs (0.25 mg total) by nebulization in the morning and at bedtime., Disp: 60 mL, Rfl: 12   dapagliflozin propanediol (FARXIGA) 10 MG TABS tablet, Take 1 tablet (10 mg total) by mouth daily before breakfast., Disp: 90 tablet, Rfl: 0   diltiazem (CARDIZEM CD) 360 MG 24 hr capsule, TAKE 1 Capsule BY MOUTH ONCE DAILY, Disp: 90 capsule, Rfl: 0   methocarbamol (ROBAXIN) 500 MG tablet, Take 1 tablet by mouth daily as needed for muscle spasms. , Disp: , Rfl:    Multiple Vitamins-Minerals (MULTIVITAMIN WITH MINERALS) tablet, Take 1 tablet by mouth daily., Disp: , Rfl:    potassium chloride (KLOR-CON) 10 MEQ tablet, TAKE TWO TABLETS by mouth TWICE DAILY (Patient taking  differently: 10 mEq daily.), Disp: 120 tablet, Rfl: 1   predniSONE (DELTASONE) 5 MG tablet, Take 1 tablet (5 mg total) by mouth daily with breakfast., Disp: 90 tablet, Rfl: 0   sacubitril-valsartan (ENTRESTO) 49-51 MG, Take 1 tablet by mouth 2 (two) times daily., Disp: , Rfl:    solifenacin (VESICARE) 5 MG tablet, Take 5 mg by mouth daily., Disp: , Rfl:    tamsulosin (FLOMAX) 0.4 MG CAPS capsule, Take 1 capsule (0.4 mg total) by mouth daily after supper. (Patient taking differently: Take 0.4 mg by mouth at bedtime.), Disp: 30 capsule, Rfl: 0   traMADol (ULTRAM) 50 MG tablet, Take 50 mg by mouth 2 (two) times daily as needed for moderate pain. , Disp: , Rfl:   Orders Placed This Encounter  Procedures   Basic metabolic panel   Magnesium   Pro b natriuretic peptide (BNP)   EKG 12-Lead   --Continue cardiac medications as reconciled in final medication list. --Return in about 3 months (around 06/20/2021) for Follow up, heart failure management.. Or sooner if needed. --Continue follow-up with your primary care physician regarding the management of your other chronic comorbid conditions.  Total time spent: 32 minutes.  Patient's questions and concerns were addressed to his satisfaction. He voices understanding of the instructions provided during this encounter.   This note was created using a voice recognition software as a result there may be grammatical errors inadvertently enclosed that do not reflect the nature of this encounter. Every attempt is made to correct such errors.  Rex Kras, [place], [facility]  Pager: [phone] Office: [phone]

## 2021-03-22 ENCOUNTER — Other Ambulatory Visit: Payer: Self-pay | Admitting: Pharmacist

## 2021-03-22 DIAGNOSIS — I5032 Chronic diastolic (congestive) heart failure: Secondary | ICD-10-CM

## 2021-03-23 MED ORDER — EMPAGLIFLOZIN 10 MG PO TABS: 10.0000 mg | ORAL_TABLET | Freq: Every day | ORAL | 2 refills | Status: DC

## 2021-03-27 ENCOUNTER — Other Ambulatory Visit: Payer: Self-pay

## 2021-03-27 DIAGNOSIS — I5032 Chronic diastolic (congestive) heart failure: Secondary | ICD-10-CM

## 2021-03-28 DIAGNOSIS — M25572 Pain in left ankle and joints of left foot: Secondary | ICD-10-CM | POA: Diagnosis not present

## 2021-03-28 DIAGNOSIS — I5032 Chronic diastolic (congestive) heart failure: Secondary | ICD-10-CM | POA: Diagnosis not present

## 2021-03-29 ENCOUNTER — Other Ambulatory Visit: Payer: Self-pay

## 2021-03-29 DIAGNOSIS — I5032 Chronic diastolic (congestive) heart failure: Secondary | ICD-10-CM

## 2021-03-29 LAB — BASIC METABOLIC PANEL
BUN/Creatinine Ratio: 16 (ref 10–24)
BUN: 17 mg/dL (ref 8–27)
CO2: 22 mmol/L (ref 20–29)
Calcium: 9.6 mg/dL (ref 8.6–10.2)
Chloride: 104 mmol/L (ref 96–106)
Creatinine, Ser: 1.05 mg/dL (ref 0.76–1.27)
Glucose: 123 mg/dL — ABNORMAL HIGH (ref 65–99)
Potassium: 4.5 mmol/L (ref 3.5–5.2)
Sodium: 141 mmol/L (ref 134–144)
eGFR: 77 mL/min/{1.73_m2} (ref >59)

## 2021-03-29 LAB — MAGNESIUM: Magnesium: 2.2 mg/dL (ref 1.6–2.3)

## 2021-03-29 LAB — PRO B NATRIURETIC PEPTIDE: NT-Pro BNP: 296 pg/mL (ref 0–376)

## 2021-03-29 MED ORDER — EMPAGLIFLOZIN 10 MG PO TABS: 10.0000 mg | ORAL_TABLET | Freq: Every day | ORAL | 2 refills | Status: DC

## 2021-03-29 NOTE — Progress Notes (Signed)
Patient is aware of results and rx has been sent

## 2021-04-02 ENCOUNTER — Other Ambulatory Visit: Payer: Self-pay | Admitting: Pharmacist

## 2021-04-02 DIAGNOSIS — I5032 Chronic diastolic (congestive) heart failure: Secondary | ICD-10-CM

## 2021-04-02 MED ORDER — TORSEMIDE 20 MG PO TABS: 10.0000 mg | ORAL_TABLET | Freq: Every day | ORAL | 2 refills | Status: DC

## 2021-04-02 NOTE — Progress Notes (Signed)
Pt called complaining of bilateral lower extremity swelling. Onset of swelling past Friday (03/30/21) and has been persistent since. Denies any complains of pain, redness, warm to touch. Reports recovering from recent ankle fracture and continues to be followed up with orthopedist. Pt tolerating new start jardiance  Pt interesting in restarting previous home torsemide dose to help alleviate acute swelling concerns. Reviewed with Dr. Terri Skains. Torsemide was recently stopped as pt was started on farxiga. Per Dr. Terri Skains, instructed pt to restart home torsemide with reduced dose of 10 mg daily. Pt to get repeat labs in 1 week. Pt agreeable with plan and verbalized getting labs completed next Tues or Wednesday.

## 2021-04-05 ENCOUNTER — Other Ambulatory Visit: Payer: Self-pay | Admitting: Cardiology

## 2021-04-05 DIAGNOSIS — I5032 Chronic diastolic (congestive) heart failure: Secondary | ICD-10-CM

## 2021-04-05 DIAGNOSIS — R0609 Other forms of dyspnea: Secondary | ICD-10-CM

## 2021-04-05 DIAGNOSIS — R0600 Dyspnea, unspecified: Secondary | ICD-10-CM

## 2021-04-10 DIAGNOSIS — G4730 Sleep apnea, unspecified: Secondary | ICD-10-CM | POA: Diagnosis not present

## 2021-04-10 DIAGNOSIS — I5033 Acute on chronic diastolic (congestive) heart failure: Secondary | ICD-10-CM | POA: Diagnosis not present

## 2021-04-10 DIAGNOSIS — J189 Pneumonia, unspecified organism: Secondary | ICD-10-CM | POA: Diagnosis not present

## 2021-04-10 DIAGNOSIS — J449 Chronic obstructive pulmonary disease, unspecified: Secondary | ICD-10-CM | POA: Diagnosis not present

## 2021-04-18 DIAGNOSIS — G894 Chronic pain syndrome: Secondary | ICD-10-CM | POA: Diagnosis not present

## 2021-04-18 DIAGNOSIS — M545 Low back pain, unspecified: Secondary | ICD-10-CM | POA: Diagnosis not present

## 2021-04-18 DIAGNOSIS — M4727 Other spondylosis with radiculopathy, lumbosacral region: Secondary | ICD-10-CM | POA: Diagnosis not present

## 2021-04-18 DIAGNOSIS — M79652 Pain in left thigh: Secondary | ICD-10-CM | POA: Diagnosis not present

## 2021-04-20 DIAGNOSIS — M545 Low back pain, unspecified: Secondary | ICD-10-CM | POA: Diagnosis not present

## 2021-04-25 DIAGNOSIS — M25572 Pain in left ankle and joints of left foot: Secondary | ICD-10-CM | POA: Diagnosis not present

## 2021-04-26 ENCOUNTER — Other Ambulatory Visit: Payer: Self-pay

## 2021-04-26 ENCOUNTER — Ambulatory Visit (HOSPITAL_COMMUNITY)
Admission: RE | Admit: 2021-04-26 | Discharge: 2021-04-26 | Disposition: A | Discharge status: Home / Self Care | Payer: Medicare Other | Source: Ambulatory Visit | Attending: Adult Health | Admitting: Adult Health

## 2021-04-26 ENCOUNTER — Other Ambulatory Visit (HOSPITAL_COMMUNITY): Payer: Self-pay | Admitting: Adult Health

## 2021-04-26 DIAGNOSIS — D72828 Other elevated white blood cell count: Secondary | ICD-10-CM | POA: Diagnosis not present

## 2021-04-26 DIAGNOSIS — M7989 Other specified soft tissue disorders: Secondary | ICD-10-CM

## 2021-04-26 DIAGNOSIS — M79605 Pain in left leg: Secondary | ICD-10-CM | POA: Diagnosis not present

## 2021-04-26 DIAGNOSIS — Z8781 Personal history of (healed) traumatic fracture: Secondary | ICD-10-CM | POA: Diagnosis not present

## 2021-05-01 DIAGNOSIS — R059 Cough, unspecified: Secondary | ICD-10-CM | POA: Diagnosis not present

## 2021-05-03 DIAGNOSIS — M4727 Other spondylosis with radiculopathy, lumbosacral region: Secondary | ICD-10-CM | POA: Diagnosis not present

## 2021-05-09 ENCOUNTER — Other Ambulatory Visit: Payer: Self-pay

## 2021-05-09 ENCOUNTER — Other Ambulatory Visit: Payer: Self-pay | Admitting: Cardiology

## 2021-05-09 ENCOUNTER — Ambulatory Visit: Payer: Medicare Other | Admitting: Internal Medicine

## 2021-05-09 VITALS — BP 109/75 | HR 70 | Temp 98.5°F | Resp 16 | Ht 67.0 in | Wt 248.5 lb

## 2021-05-09 DIAGNOSIS — I878 Other specified disorders of veins: Secondary | ICD-10-CM | POA: Diagnosis not present

## 2021-05-09 DIAGNOSIS — I5032 Chronic diastolic (congestive) heart failure: Secondary | ICD-10-CM

## 2021-05-09 NOTE — Patient Instructions (Signed)
You don't have cellulitis  What you have is venous stasis related skin changes which at times can look like cellulitis but it changes over time   Treatment is to prevent edema --> compression stocking or coban/curlex wrap   Edema can also be helped with legs elevation when you rest, or frequent leg exercise

## 2021-05-09 NOTE — Progress Notes (Signed)
[facility]  Reason for Consult:cellulitis Referring Provider: [name]    Patient Active Problem List   Diagnosis Date Noted   Acute on chronic diastolic CHF (congestive heart failure) ([place]) 05/06/2020   Mixed hyperlipidemia 05/06/2020   Essential hypertension 05/06/2020   Atrial fibrillation ([place]) 05/06/2020   Hypokalemia 05/06/2020   Prediabetes 05/06/2020   Leukocytosis 05/06/2020   Coronary artery disease involving native coronary artery of native heart 05/06/2020   Atypical chest pain 05/06/2020   Genetic testing 09/10/2018   Family history of breast cancer    Family history of pancreatic cancer    Family history of kidney cancer    Malignant neoplasm of prostate ([place]) 05/22/2017   OM (otitis media), acute 09/08/2012   Sinusitis 09/08/2012   Conjunctivitis 09/08/2012   GASTROENTERITIS 11/10/2009   DIVERTICULOSIS, COLON 11/10/2009   NEPHROLITHIASIS, HX OF 11/10/2009   PYELONEPHRITIS NOS 05/02/2007   ABDOMINAL PAIN, LEFT LOWER QUADRANT 04/28/2007      HPI: Bobby [name]. is a 70 y.o. male hx chf, recent LLE fracture, referred by pcp for left leg cellulitis   He broke his left leg 2 months prior to this visit. Was in a cast. Noticed swelling on the same side since the fracture. After cast removed, noticed persistent swelling and red discoloration. No pain/f/c  Pcp gave him 2 courses of abx the most recent course10 days doxy finished 4 days prior to this visit  No change in how skin looks/feels with abx  A dvt u/s done and was negative  He feels well otherwise   No n/v/diarrhea    Review of Systems: ROS       Past Medical History:  Diagnosis Date   Bronchitis    CHF (congestive heart failure) ([place]) 04/2020   A/C HFpEF   Coronary artery calcification of native artery    Family history of pancreatic cancer    History of kidney stones    Hyperlipidemia    Hypertension    Pneumonia    Prostate  cancer ([place])     Social History   Tobacco Use   Smoking status: Former    Packs/day: 0.25    Years: 10.00    Pack years: 2.50    Types: Cigarettes    Quit date: 01/20/2012    Years since quitting: 9.3   Smokeless tobacco: Never  Vaping Use   Vaping Use: Never used  Substance Use Topics   Alcohol use: No   Drug use: No    Family History  Problem Relation Age of Onset   Breast cancer Sister 65       "negative genetic testing"   Pancreatic cancer Brother 17   Breast cancer Maternal Aunt    Kidney cancer Maternal Aunt        dx 30s    No Known Allergies  OBJECTIVE: Vitals:   05/09/21 1402  BP: 109/75  Pulse: 70  Resp: 16  Temp: 98.5 F (36.9 C)  TempSrc: Oral  SpO2: 97%  Weight: 248 lb 8 oz (112.7 kg)  Height: '5\' 7"'$  (1.702 m)   Body mass index is 38.92 kg/m.   Physical Exam General/constitutional: no distress, pleasant HEENT: Normocephalic, PER, Conj Clear, EOMI, Oropharynx clear Neck supple CV: rrr no mrg Lungs: clear to auscultation, normal respiratory effort Abd: Soft, Nontender Ext: no edema Skin: left LE with slight edema and hyperpigmentation of skin without tenderness/purulence/erythema Neuro: nonfocal MSK: no peripheral joint swelling/tenderness/warmth; back  spines nontender  Lab:  Microbiology:  Serology:  Imaging:   Assessment/plan: Problem List Items Addressed This Visit   None Visit Diagnoses     Venous stasis    -  Primary      Discussed that he likely had some dermatitis related to venous stasis, but no infection   Discussed need to compression wrap and prevent further persistent edema otherwise skin breakdown can occur and predispose to actual cellulitis   -consider kenalog cream if redness of venous stasis dermatitis occurs -discussed copression stocking vs coban wrap -no need to f/u id clinic    I have spent a total of 50 minutes of face-to-face and non-face-to-face time, excluding clinical staff time, preparing  to see patient, ordering tests and/or medications, and provide counseling the patient        Follow-up: No follow-ups on file.  Jabier Mutton, [facility] for [facility] [phone] pager   [phone] cell 05/09/2021, 2:12 PM

## 2021-05-14 DIAGNOSIS — I5032 Chronic diastolic (congestive) heart failure: Secondary | ICD-10-CM | POA: Diagnosis not present

## 2021-05-16 ENCOUNTER — Other Ambulatory Visit (HOSPITAL_COMMUNITY): Payer: Self-pay

## 2021-05-25 ENCOUNTER — Emergency Department (HOSPITAL_BASED_OUTPATIENT_CLINIC_OR_DEPARTMENT_OTHER)
Admission: EM | Admit: 2021-05-25 | Discharge: 2021-05-25 | Disposition: A | Discharge status: Home / Self Care | Payer: Medicare Other | Attending: Emergency Medicine | Admitting: Emergency Medicine

## 2021-05-25 ENCOUNTER — Other Ambulatory Visit: Payer: Self-pay

## 2021-05-25 ENCOUNTER — Encounter (HOSPITAL_BASED_OUTPATIENT_CLINIC_OR_DEPARTMENT_OTHER): Payer: Self-pay

## 2021-05-25 DIAGNOSIS — Z87891 Personal history of nicotine dependence: Secondary | ICD-10-CM | POA: Insufficient documentation

## 2021-05-25 DIAGNOSIS — Z79899 Other long term (current) drug therapy: Secondary | ICD-10-CM | POA: Diagnosis not present

## 2021-05-25 DIAGNOSIS — I5033 Acute on chronic diastolic (congestive) heart failure: Secondary | ICD-10-CM | POA: Diagnosis not present

## 2021-05-25 DIAGNOSIS — J029 Acute pharyngitis, unspecified: Secondary | ICD-10-CM | POA: Insufficient documentation

## 2021-05-25 DIAGNOSIS — I110 Hypertensive heart disease with heart failure: Secondary | ICD-10-CM | POA: Insufficient documentation

## 2021-05-25 DIAGNOSIS — Z8546 Personal history of malignant neoplasm of prostate: Secondary | ICD-10-CM | POA: Diagnosis not present

## 2021-05-25 DIAGNOSIS — Z955 Presence of coronary angioplasty implant and graft: Secondary | ICD-10-CM | POA: Insufficient documentation

## 2021-05-25 DIAGNOSIS — R109 Unspecified abdominal pain: Secondary | ICD-10-CM | POA: Diagnosis present

## 2021-05-25 DIAGNOSIS — K209 Esophagitis, unspecified without bleeding: Secondary | ICD-10-CM | POA: Insufficient documentation

## 2021-05-25 DIAGNOSIS — I2510 Atherosclerotic heart disease of native coronary artery without angina pectoris: Secondary | ICD-10-CM | POA: Diagnosis not present

## 2021-05-25 DIAGNOSIS — Z7982 Long term (current) use of aspirin: Secondary | ICD-10-CM | POA: Diagnosis not present

## 2021-05-25 LAB — GROUP A STREP BY PCR: Group A Strep by PCR: NOT DETECTED

## 2021-05-25 LAB — MONONUCLEOSIS SCREEN: Mono Screen: NEGATIVE

## 2021-05-25 MED ORDER — LIDOCAINE VISCOUS HCL 2 % MT SOLN: 15.0000 mL | OROMUCOSAL | 0 refills | Status: DC | PRN

## 2021-05-25 MED ORDER — NYSTATIN 100000 UNIT/ML MT SUSP: 500000.0000 [IU] | Freq: Four times a day (QID) | OROMUCOSAL | 0 refills | Status: DC

## 2021-05-25 MED ORDER — LIDOCAINE VISCOUS HCL 2 % MT SOLN
15.0000 mL | Freq: Once | OROMUCOSAL | Status: AC
  Administered 2021-05-25: 15 mL via OROMUCOSAL
  Filled 2021-05-25: qty 15

## 2021-05-25 NOTE — ED Provider Notes (Signed)
[facility] EMERGENCY DEPT Provider Note   CSN: [id number] Arrival date & time: 05/25/21  1138     History Chief Complaint  Patient presents with   Sore Throat    Bobby [name]. is a 70 y.o. male.  The history is provided by the patient. No language interpreter was used.  Sore Throat    70 year old male with significant history of prostate cancer, CHF, CAD who presents for complaints of sore throat.  Patient report for the past 2 days he noticed progressive worsening sore throat.  Described as a sore sensation worse with swallowing with accompanied pain to the left side of his neck.  Symptoms moderate in intensity, unrelieved despite using over-the-counter medication.  No associated fever, headache, runny nose, sneezing, coughing, chest pain, trouble breathing, abdominal cramping, nausea or vomiting.  No recent sick contact.  Patient has been fully vaccinated for COVID-19 as well as having booster.  He did a home COVID test yesterday which came back negative.  Past Medical History:  Diagnosis Date   Bronchitis    CHF (congestive heart failure) ([place]) 04/2020   A/C HFpEF   Coronary artery calcification of native artery    Family history of pancreatic cancer    History of kidney stones    Hyperlipidemia    Hypertension    Pneumonia    Prostate cancer [facility])     Patient Active Problem List   Diagnosis Date Noted   Acute on chronic diastolic CHF (congestive heart failure) ([place]) 05/06/2020   Mixed hyperlipidemia 05/06/2020   Essential hypertension 05/06/2020   Atrial fibrillation ([place]) 05/06/2020   Hypokalemia 05/06/2020   Prediabetes 05/06/2020   Leukocytosis 05/06/2020   Coronary artery disease involving native coronary artery of native heart 05/06/2020   Atypical chest pain 05/06/2020   Genetic testing 09/10/2018   Family history of breast cancer    Family history of pancreatic cancer    Family history of kidney cancer    Malignant neoplasm of  prostate ([place]) 05/22/2017   OM (otitis media), acute 09/08/2012   Sinusitis 09/08/2012   Conjunctivitis 09/08/2012   GASTROENTERITIS 11/10/2009   DIVERTICULOSIS, COLON 11/10/2009   NEPHROLITHIASIS, HX OF 11/10/2009   PYELONEPHRITIS NOS 05/02/2007   ABDOMINAL PAIN, LEFT LOWER QUADRANT 04/28/2007    Past Surgical History:  Procedure Laterality Date   EXTRACORPOREAL SHOCK WAVE LITHOTRIPSY Right 04/17/2017   Procedure: RIGHT EXTRACORPOREAL SHOCK WAVE LITHOTRIPSY (ESWL);  Surgeon: Kathie Rhodes, MD;  Location: [facility];  Service: Urology;  Laterality: Right;   HERNIA REPAIR     age 67   LEFT HEART CATH AND CORONARY ANGIOGRAPHY N/A 05/08/2020   Procedure: LEFT HEART CATH AND CORONARY ANGIOGRAPHY;  Surgeon: Adrian Prows, MD;  Location: [facility];  Service: Cardiovascular;  Laterality: N/A;   PROSTATE BIOPSY         Family History  Problem Relation Age of Onset   Breast cancer Sister 107       "negative genetic testing"   Pancreatic cancer Brother 42   Breast cancer Maternal Aunt    Kidney cancer Maternal Aunt        dx 44s    Social History   Tobacco Use   Smoking status: Former    Packs/day: 0.25    Years: 10.00    Pack years: 2.50    Types: Cigarettes    Quit date: 01/20/2012    Years since quitting: 9.3   Smokeless tobacco: Never  Vaping Use   Vaping Use: Never  used  Substance Use Topics   Alcohol use: No   Drug use: No    Home Medications Prior to Admission medications   Medication Sig Start Date End Date Taking? Authorizing Provider  arformoterol (BROVANA) 15 MCG/2ML NEBU Take 2 mLs (15 mcg total) by nebulization 2 (two) times daily. 11/29/20   Freddi Starr, MD  aspirin EC 81 MG tablet Take 1 tablet (81 mg total) by mouth daily. 01/21/20   Tolia, Sunit, DO  atorvastatin (LIPITOR) 40 MG tablet TAKE ONE TABLET (40 MG total) BY MOUTH DAILY 04/06/21   Tolia, Sunit, DO  budesonide (PULMICORT) 0.25 MG/2ML nebulizer solution Take 2 mLs (0.25 mg total) by  nebulization in the morning and at bedtime. 05/30/20   Freddi Starr, MD  diltiazem (CARDIZEM CD) 360 MG 24 hr capsule TAKE ONE TABLET BY MOUTH ONCE DAILY 04/06/21   Tolia, Sunit, DO  JARDIANCE 10 MG TABS tablet TAKE 1 Tablet BY MOUTH ONCE DAILY BEFORE breakfast. 05/10/21   Tolia, Sunit, DO  methocarbamol (ROBAXIN) 500 MG tablet Take 1 tablet by mouth daily as needed for muscle spasms.     [provider]  Multiple Vitamins-Minerals (MULTIVITAMIN WITH MINERALS) tablet Take 1 tablet by mouth daily.    [provider]  predniSONE (DELTASONE) 5 MG tablet Take 1 tablet (5 mg total) by mouth daily with breakfast. 10/17/20   Wyatt Portela, MD  sacubitril-valsartan (ENTRESTO) 49-51 MG Take 1 tablet by mouth 2 (two) times daily.    [provider]  solifenacin (VESICARE) 5 MG tablet Take 5 mg by mouth daily. 04/03/20   [provider]  tamsulosin (FLOMAX) 0.4 MG CAPS capsule Take 1 capsule (0.4 mg total) by mouth daily after supper. Patient taking differently: Take 0.4 mg by mouth at bedtime. 04/17/17   Kathie Rhodes, MD  traMADol (ULTRAM) 50 MG tablet Take 50 mg by mouth 2 (two) times daily as needed for moderate pain.  03/29/20   [provider]    Allergies    Patient has no known allergies.  Review of Systems   Review of Systems  Constitutional:  Negative for fever.  HENT:  Positive for sore throat and trouble swallowing. Negative for voice change.    Physical Exam Updated Vital Signs BP 136/68 (BP Location: Right Arm)   Pulse 84   Temp 98.4 F (36.9 C)   Resp 18   Ht '5\' 7"'$  (1.702 m)   Wt 112.7 kg   SpO2 99%   BMI 38.91 kg/m   Physical Exam Vitals and nursing note reviewed.  Constitutional:      General: He is not in acute distress.    Appearance: He is well-developed.     Comments: Patient is well-appearing, speaking in complete sentences, appears to be in no acute discomfort, normal phonation  HENT:     Head: Atraumatic.      Mouth/Throat:     Mouth: Mucous membranes are moist. Oral lesions present.     Pharynx: Uvula midline. Posterior oropharyngeal erythema present. No oropharyngeal exudate or uvula swelling.     Comments: Posterior oropharyngeal erythema noted with multiple circumferential ulceration approximately 2 mm in diameter scattered throughout the soft palate.  No PTA Eyes:     Conjunctiva/sclera: Conjunctivae normal.  Cardiovascular:     Rate and Rhythm: Normal rate and regular rhythm.     Heart sounds: Normal heart sounds.  Pulmonary:     Effort: Pulmonary effort is normal.     Breath sounds: No  wheezing, rhonchi or rales.  Abdominal:     Palpations: Abdomen is soft.  Musculoskeletal:     Cervical back: Neck supple.  Lymphadenopathy:     Cervical: Cervical adenopathy present.  Skin:    Findings: No rash.  Neurological:     Mental Status: He is alert.  Psychiatric:        Mood and Affect: Mood normal.    ED Results / Procedures / Treatments   Labs (all labs ordered are listed, but only abnormal results are displayed) Labs Reviewed  GROUP A STREP BY PCR  MONONUCLEOSIS SCREEN    EKG None  Radiology No results found.  Procedures Procedures   Medications Ordered in ED Medications  lidocaine (XYLOCAINE) 2 % viscous mouth solution 15 mL (has no administration in time range)    ED Course  I have reviewed the triage vital signs and the nursing notes.  Pertinent labs & imaging results that were available during my care of the patient were reviewed by me and considered in my medical decision making (see chart for details).    MDM Rules/Calculators/A&P                           BP 136/68 (BP Location: Right Arm)   Pulse 69   Temp 98.4 F (36.9 C)   Resp 18   Ht '5\' 7"'$  (1.702 m)   Wt 112.7 kg   SpO2 99%   BMI 38.91 kg/m   Final Clinical Impression(s) / ED Diagnoses Final diagnoses:  Esophagitis, acute    Rx / DC Orders ED Discharge Orders          Ordered     lidocaine (XYLOCAINE) 2 % solution  As needed        05/25/21 1517    nystatin (MYCOSTATIN) 100000 UNIT/ML suspension  4 times daily        05/25/21 1520           1:49 PM Patient here with sore throat for the past 2 days.  Exam revealed posterior oropharyngeal erythema with multiple small ulcerations to the soft palate.  No evidence of airway compromise, normal phonation, no no findings concerning for deep tissue infection at this time.  Patient has been fully vaccinated for COVID-19 and did had a negative COVID test at home last night.  Will obtain strep and mono, provide viscous lidocaine for symptom control.  Care discussed with Dr. Roslynn Amble.   3:11 PM Monospot and group A strep test came back negative.  Will discharge home with symptomatic treatment, suspect viral etiology. Dr. Roslynn Amble seen pt and agrees.  Return precaution given   I have also considered thrush as a potential cause as patient does use budesonide nebulized solution nightly this may increase the risk of candidal esophagitis.  I will prescribe nystatin solution as treatment if his symptoms persist.   Domenic Moras, PA-C 05/25/21 1525    Lucrezia Starch, MD 05/26/21 [phone]

## 2021-05-25 NOTE — Discharge Instructions (Addendum)
You have been evaluated for your sore throat.  Your strep test and your monotest came back negative.  Your sore throat is likely due to a viral infection.  Please gargle your throat with viscous lidocaine as needed for comfort.  You may take Tylenol or ibuprofen for aches and pain.  Follow-up with your doctor for further care.  Return if you have any concern.  Sometimes using Pulmicort nebulized solution regularly may increase the risk of having thrush esophagitis which is a fungal infection.  If your symptoms persist, consider rinsing mouth four times daily with Nystatin solution.

## 2021-05-25 NOTE — ED Triage Notes (Signed)
Sore throat over last 3-4 days, increasing pain with swallowing.

## 2021-05-29 ENCOUNTER — Other Ambulatory Visit: Payer: Self-pay | Admitting: *Deleted

## 2021-05-29 DIAGNOSIS — C61 Malignant neoplasm of prostate: Secondary | ICD-10-CM

## 2021-05-29 MED ORDER — ABIRATERONE ACETATE 250 MG PO TABS: 1000.0000 mg | ORAL_TABLET | Freq: Every day | ORAL | 0 refills | Status: DC

## 2021-05-30 ENCOUNTER — Other Ambulatory Visit (HOSPITAL_COMMUNITY): Payer: Self-pay

## 2021-05-30 ENCOUNTER — Telehealth: Payer: Self-pay

## 2021-05-30 ENCOUNTER — Encounter: Payer: Self-pay | Admitting: Oncology

## 2021-05-30 NOTE — Telephone Encounter (Signed)
Oral Oncology Patient Advocate Encounter   Was successful in securing patient a $3500 grant from [facility] [place] (PAF) to provide copayment coverage for Zytiga.  This will keep the out of pocket expense at $0.     I have spoken with the patient.    The billing information is as follows and has been shared with [place]: [id number] PCN:  [id number] Member ID: [id number] Group ID: [id number] Dates of Eligibility: 05/30/21 through 05/30/22  [place] [facility] [place] Phone [phone] Fax [phone] 05/30/2021 3:07 PM

## 2021-06-01 DIAGNOSIS — D485 Neoplasm of uncertain behavior of skin: Secondary | ICD-10-CM | POA: Diagnosis not present

## 2021-06-01 DIAGNOSIS — D225 Melanocytic nevi of trunk: Secondary | ICD-10-CM | POA: Diagnosis not present

## 2021-06-01 DIAGNOSIS — I872 Venous insufficiency (chronic) (peripheral): Secondary | ICD-10-CM | POA: Diagnosis not present

## 2021-06-08 ENCOUNTER — Telehealth: Payer: Self-pay | Admitting: Oncology

## 2021-06-08 NOTE — Telephone Encounter (Signed)
Called patient regarding upcoming October appointments, left a voicemail.  

## 2021-06-13 DIAGNOSIS — I5033 Acute on chronic diastolic (congestive) heart failure: Secondary | ICD-10-CM | POA: Diagnosis not present

## 2021-06-19 ENCOUNTER — Other Ambulatory Visit: Payer: Self-pay

## 2021-06-19 ENCOUNTER — Inpatient Hospital Stay: Payer: Medicare Other | Attending: Oncology

## 2021-06-19 ENCOUNTER — Telehealth: Payer: Self-pay | Admitting: *Deleted

## 2021-06-19 ENCOUNTER — Inpatient Hospital Stay: Payer: Medicare Other

## 2021-06-19 ENCOUNTER — Inpatient Hospital Stay: Payer: Medicare Other | Admitting: Oncology

## 2021-06-19 VITALS — BP 122/66 | HR 72 | Temp 97.9°F | Resp 18 | Ht 67.0 in | Wt 245.2 lb

## 2021-06-19 DIAGNOSIS — Z5111 Encounter for antineoplastic chemotherapy: Secondary | ICD-10-CM | POA: Insufficient documentation

## 2021-06-19 DIAGNOSIS — C61 Malignant neoplasm of prostate: Secondary | ICD-10-CM | POA: Diagnosis not present

## 2021-06-19 LAB — CBC WITH DIFFERENTIAL (CANCER CENTER ONLY)
Abs Immature Granulocytes: 0.03 10*3/uL (ref 0.00–0.07)
Basophils Absolute: 0.1 10*3/uL (ref 0.0–0.1)
Basophils Relative: 1 %
Eosinophils Absolute: 0.2 10*3/uL (ref 0.0–0.5)
Eosinophils Relative: 2 %
HCT: 45.3 % (ref 39.0–52.0)
Hemoglobin: 14.8 g/dL (ref 13.0–17.0)
Immature Granulocytes: 0 %
Lymphocytes Relative: 16 %
Lymphs Abs: 1.4 10*3/uL (ref 0.7–4.0)
MCH: 27.4 pg (ref 26.0–34.0)
MCHC: 32.7 g/dL (ref 30.0–36.0)
MCV: 83.9 fL (ref 80.0–100.0)
Monocytes Absolute: 0.6 10*3/uL (ref 0.1–1.0)
Monocytes Relative: 7 %
Neutro Abs: 6.3 10*3/uL (ref 1.7–7.7)
Neutrophils Relative %: 74 %
Platelet Count: 279 10*3/uL (ref 150–400)
RBC: 5.4 MIL/uL (ref 4.22–5.81)
RDW: 15.9 % — ABNORMAL HIGH (ref 11.5–15.5)
WBC Count: 8.6 10*3/uL (ref 4.0–10.5)
nRBC: 0 % (ref 0.0–0.2)

## 2021-06-19 LAB — CMP (CANCER CENTER ONLY)
ALT: 12 U/L (ref 0–44)
AST: 11 U/L — ABNORMAL LOW (ref 15–41)
Albumin: 3.9 g/dL (ref 3.5–5.0)
Alkaline Phosphatase: 106 U/L (ref 38–126)
Anion gap: 11 (ref 5–15)
BUN: 21 mg/dL (ref 8–23)
CO2: 25 mmol/L (ref 22–32)
Calcium: 9.8 mg/dL (ref 8.9–10.3)
Chloride: 104 mmol/L (ref 98–111)
Creatinine: 1.26 mg/dL — ABNORMAL HIGH (ref 0.61–1.24)
GFR, Estimated: >60 mL/min (ref >60)
Glucose, Bld: 131 mg/dL — ABNORMAL HIGH (ref 70–99)
Potassium: 3.9 mmol/L (ref 3.5–5.1)
Sodium: 140 mmol/L (ref 135–145)
Total Bilirubin: 0.6 mg/dL (ref 0.3–1.2)
Total Protein: 6.9 g/dL (ref 6.5–8.1)

## 2021-06-19 MED ORDER — LEUPROLIDE ACETATE (4 MONTH) 30 MG ~~LOC~~ KIT
30.0000 mg | PACK | Freq: Once | SUBCUTANEOUS | Status: AC
  Administered 2021-06-19: 30 mg via SUBCUTANEOUS
  Filled 2021-06-19: qty 30

## 2021-06-19 NOTE — Progress Notes (Signed)
Hematology and Oncology Follow Up Visit  Bobby [name] [id number] March 03, 1951 70 y.o. 06/19/2021 9:32 AM Bobby [name], MDSouth, Bobby Main, MD   Principle Diagnosis: 70 year old man with advanced prostate cancer with lymphadenopathy diagnosed in 2018.  He has castration-sensitive after presenting with Gleason score 4+5 = 9 and PSA of 14.7.    Prior Therapy: He is status post prostate biopsy in August 2018.  Current therapy:  Eligard 30 mg every 4 months.  He will receive Eligard in October 2022.  Zytiga 1000 mg daily with prednisone 5 mg daily started in October 2018.  Interim History: [name] is here for a follow-up visit.  Since the last visit, he reports no major changes in his health.  He denies any recent hospitalizations or illnesses.  He denies any nausea, vomiting or abdominal pain.  He denies any recent bone pain or pathological fractures.  He continues to tolerate Zytiga without any complaints.  His performance status quality of life remain excellent.     Medications: Updated on review. Current Outpatient Medications  Medication Sig Dispense Refill   abiraterone acetate (ZYTIGA) 250 MG tablet Take 4 tablets (1,000 mg total) by mouth daily. Take on an empty stomach 1 hour before or 2 hours after a meal 120 tablet 0   arformoterol (BROVANA) 15 MCG/2ML NEBU Take 2 mLs (15 mcg total) by nebulization 2 (two) times daily. 120 mL 6   aspirin EC 81 MG tablet Take 1 tablet (81 mg total) by mouth daily. 90 tablet 3   atorvastatin (LIPITOR) 40 MG tablet TAKE ONE TABLET (40 MG total) BY MOUTH DAILY 90 tablet 0   budesonide (PULMICORT) 0.25 MG/2ML nebulizer solution Take 2 mLs (0.25 mg total) by nebulization in the morning and at bedtime. 60 mL 12   diltiazem (CARDIZEM CD) 360 MG 24 hr capsule TAKE ONE TABLET BY MOUTH ONCE DAILY 90 capsule 0   JARDIANCE 10 MG TABS tablet TAKE 1 Tablet BY MOUTH ONCE DAILY BEFORE breakfast. 30 tablet 2   lidocaine (XYLOCAINE) 2 % solution Use as  directed 15 mLs in the mouth or throat as needed for mouth pain. 150 mL 0   methocarbamol (ROBAXIN) 500 MG tablet Take 1 tablet by mouth daily as needed for muscle spasms.      Multiple Vitamins-Minerals (MULTIVITAMIN WITH MINERALS) tablet Take 1 tablet by mouth daily.     nystatin (MYCOSTATIN) 100000 UNIT/ML suspension Take 5 mLs (500,000 Units total) by mouth 4 (four) times daily. 60 mL 0   predniSONE (DELTASONE) 5 MG tablet Take 1 tablet (5 mg total) by mouth daily with breakfast. 90 tablet 0   sacubitril-valsartan (ENTRESTO) 49-51 MG Take 1 tablet by mouth 2 (two) times daily.     solifenacin (VESICARE) 5 MG tablet Take 5 mg by mouth daily.     tamsulosin (FLOMAX) 0.4 MG CAPS capsule Take 1 capsule (0.4 mg total) by mouth daily after supper. (Patient taking differently: Take 0.4 mg by mouth at bedtime.) 30 capsule 0   traMADol (ULTRAM) 50 MG tablet Take 50 mg by mouth 2 (two) times daily as needed for moderate pain.      No current facility-administered medications for this visit.     Allergies: No Known Allergies     Physical Exam:    Blood pressure 122/66, pulse 72, temperature 97.9 F (36.6 C), resp. rate 18, height 5\' 7"  (1.702 m), weight 245 lb 3.2 oz (111.2 kg), SpO2 100 %.      ECOG: 1  General appearance: Comfortable appearing without any discomfort Head: Normocephalic without any trauma Oropharynx: Mucous membranes are moist and pink without any thrush or ulcers. Eyes: Pupils are equal and round reactive to light. Lymph nodes: No cervical, supraclavicular, inguinal or axillary lymphadenopathy.   Heart:regular rate and rhythm.  S1 and S2 without leg edema. Lung: Clear without any rhonchi or wheezes.  No dullness to percussion. Abdomin: Soft, nontender, nondistended with good bowel sounds.  No hepatosplenomegaly. Musculoskeletal: No joint deformity or effusion.  Full range of motion noted. Neurological: No deficits noted on motor, sensory and deep tendon  reflex exam. Skin: No petechial rash or dryness.  Appeared moist.               Lab Results: Lab Results  Component Value Date   WBC 11.4 (H) 02/20/2021   HGB 12.6 (L) 02/20/2021   HCT 37.0 (L) 02/20/2021   MCV 84.7 02/20/2021   PLT 262 02/20/2021     Chemistry      Component Value Date/Time   NA 141 03/28/2021 1310   NA 142 09/10/2017 1509   K 4.5 03/28/2021 1310   K 4.2 09/10/2017 1509   CL 104 03/28/2021 1310   CO2 22 03/28/2021 1310   CO2 25 09/10/2017 1509   BUN 17 03/28/2021 1310   BUN 19.4 09/10/2017 1509   CREATININE 1.05 03/28/2021 1310   CREATININE 1.19 02/20/2021 1317   CREATININE 1.2 09/10/2017 1509      Component Value Date/Time   CALCIUM 9.6 03/28/2021 1310   CALCIUM 9.7 09/10/2017 1509   ALKPHOS 98 02/20/2021 1317   ALKPHOS 84 09/10/2017 1509   AST 12 (L) 02/20/2021 1317   AST 14 09/10/2017 1509   ALT 11 02/20/2021 1317   ALT 18 09/10/2017 1509   BILITOT 0.6 02/20/2021 1317   BILITOT 0.38 09/10/2017 1509             Results for Bobby Seal BENSON JR. "Bobby [name]" (MRN [id number]) as of 06/19/2021 09:35  Ref. Range 06/15/2020 08:34 10/19/2020 13:17 02/20/2021 13:17  Prostate Specific Ag, Serum Latest Ref Range: 0.0 - 4.0 ng/mL <0.1 <0.1 <0.1        Impression and Plan:  70 year old man with:   1.  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   His PSA currently undetectable on the current therapy.  Risks and benefits of continuing Zytiga at this time were discussed.  Complications that include nausea, fatigue, edema and adrenal insufficiency were reiterated.  Different salvage therapy options such as systemic chemotherapy were reiterated.  He is agreeable to continue.   2.  Androgen deprivation therapy: Will receive Eligard today and repeated every 4 months.  Complications including weight gain and hot flashes among others were reviewed.  3.  Hypertension: His blood pressure is within normal  range currently.  4.  Electrolyte and liver function test monitoring: No abnormalities related to Zytiga noted at this time.  We will continue to monitor.  5.  Prognosis and goals of care: Therapy remains palliative although aggressive measures are warranted.   6.  Follow-up: In 4 months for repeat follow-up.   30  minutes were spent on this visit.  The time was dedicated to reviewing laboratory data, disease status update, treatment choices and future plan of care discussion.    Zola Button, MD 10/11/20229:32 AM

## 2021-06-19 NOTE — Telephone Encounter (Signed)
Rx for Zytiga 250 mg tabs sent to [facility] - fax confirmation received.

## 2021-06-20 ENCOUNTER — Telehealth: Payer: Self-pay | Admitting: *Deleted

## 2021-06-20 LAB — PROSTATE-SPECIFIC AG, SERUM (LABCORP): Prostate Specific Ag, Serum: <0.1 ng/mL (ref 0.0–4.0)

## 2021-06-20 NOTE — Telephone Encounter (Signed)
Notified of message below

## 2021-06-20 NOTE — Telephone Encounter (Signed)
-----   Message from Wyatt Portela, MD sent at 06/20/2021  8:38 AM EDT ----- Please let him know his PSA is still down

## 2021-06-21 ENCOUNTER — Other Ambulatory Visit: Payer: Self-pay

## 2021-06-21 ENCOUNTER — Ambulatory Visit: Payer: Medicare Other | Admitting: Cardiology

## 2021-06-21 ENCOUNTER — Encounter: Payer: Self-pay | Admitting: Cardiology

## 2021-06-21 VITALS — BP 119/74 | HR 83 | Temp 96.5°F | Resp 16 | Ht 67.0 in | Wt 245.0 lb

## 2021-06-21 DIAGNOSIS — E782 Mixed hyperlipidemia: Secondary | ICD-10-CM | POA: Diagnosis not present

## 2021-06-21 DIAGNOSIS — R0609 Other forms of dyspnea: Secondary | ICD-10-CM

## 2021-06-21 DIAGNOSIS — I10 Essential (primary) hypertension: Secondary | ICD-10-CM

## 2021-06-21 DIAGNOSIS — I2510 Atherosclerotic heart disease of native coronary artery without angina pectoris: Secondary | ICD-10-CM | POA: Diagnosis not present

## 2021-06-21 DIAGNOSIS — I5032 Chronic diastolic (congestive) heart failure: Secondary | ICD-10-CM | POA: Diagnosis not present

## 2021-06-21 DIAGNOSIS — Z87891 Personal history of nicotine dependence: Secondary | ICD-10-CM

## 2021-06-21 NOTE — Progress Notes (Signed)
ID:  Bobby [name]., DOB 1950/12/30, MRN [id number]  PCP:  Bobby Bowen, MD  Cardiologist: Bobby Kras, DO, [facility] (established care 01/21/2020)  Date: 06/21/21 Last Office Visit: 03/20/2021  Chief Complaint  Patient presents with   Chronic heart failure with preserved ejection fraction    Follow-up    Bobby [name]. is a 70 y.o. male who presents to the office with a  chief complaint of " 70-month follow-up for heart failure management."  His past medical history and cardiovascular risk factors are: Nonobstructive coronary artery disease, severe coronary artery calcification 1101 AU, chronic HFpEF/stage C/NYHA class II, prediabetes, hyperlipidemia, hypertension, advanced age, former smoker, obesity due to excess calories.  Patient is accompanied today by his wife Bobby [name].   Patient was originally referred to the office for evaluation of  coronary artery calcification.   Patient underwent an ischemic evaluation due to effort related dyspnea and severe coronary artery calcification results of the coronary CTA and left heart catheterization noted below for further reference.  Since then patient has been uptitrated on GDMT and has noted significant improvement in his symptoms over the last several months.  He remains euvolemic and no recent hospitalizations for congestive heart failure.  At the last office visit we initiated Entresto 49/51 mg p.o. twice daily and follow-up blood work independently reviewed which notes stable kidney function and electrolytes.  Patient is here for 85-month follow-up.  Patient states that his effort related dyspnea remains stable.  He has gained approximately 5 pounds since last office visit most likely secondary to caloric intake and decreased functional activity as he broke his left leg due to a mechanical fall.  He denies any chest pain at rest or with effort related activities.  No hospitalizations or urgent care visits since last office  encounter.  FUNCTIONAL STATUS: Walking more with grandkids, as per patient's wife.  ALLERGIES: No Known Allergies  MEDICATION LIST PRIOR TO VISIT: Current Meds  Medication Sig   abiraterone acetate (ZYTIGA) 250 MG tablet Take 4 tablets (1,000 mg total) by mouth daily. Take on an empty stomach 1 hour before or 2 hours after a meal   arformoterol (BROVANA) 15 MCG/2ML NEBU Take 2 mLs (15 mcg total) by nebulization 2 (two) times daily.   aspirin EC 81 MG tablet Take 1 tablet (81 mg total) by mouth daily.   atorvastatin (LIPITOR) 40 MG tablet TAKE ONE TABLET (40 MG total) BY MOUTH DAILY   budesonide (PULMICORT) 0.25 MG/2ML nebulizer solution Take 2 mLs (0.25 mg total) by nebulization in the morning and at bedtime.   diltiazem (CARDIZEM CD) 360 MG 24 hr capsule TAKE ONE TABLET BY MOUTH ONCE DAILY   JARDIANCE 10 MG TABS tablet TAKE 1 Tablet BY MOUTH ONCE DAILY BEFORE breakfast.   lidocaine (XYLOCAINE) 2 % solution Use as directed 15 mLs in the mouth or throat as needed for mouth pain.   methocarbamol (ROBAXIN) 500 MG tablet Take 1 tablet by mouth daily as needed for muscle spasms.    Multiple Vitamins-Minerals (MULTIVITAMIN WITH MINERALS) tablet Take 1 tablet by mouth daily.   nystatin (MYCOSTATIN) 100000 UNIT/ML suspension Take 5 mLs (500,000 Units total) by mouth 4 (four) times daily.   predniSONE (DELTASONE) 5 MG tablet Take 1 tablet (5 mg total) by mouth daily with breakfast.   sacubitril-valsartan (ENTRESTO) 49-51 MG Take 1 tablet by mouth 2 (two) times daily.   solifenacin (VESICARE) 5 MG tablet Take 5 mg by mouth daily.   tamsulosin (FLOMAX) 0.4  MG CAPS capsule Take 1 capsule (0.4 mg total) by mouth daily after supper. (Patient taking differently: Take 0.4 mg by mouth at bedtime.)   traMADol (ULTRAM) 50 MG tablet Take 50 mg by mouth 2 (two) times daily as needed for moderate pain.      PAST MEDICAL HISTORY: Past Medical History:  Diagnosis Date   Bronchitis    CHF (congestive heart  failure) ([place]) 04/2020   A/C HFpEF   Coronary artery calcification of native artery    Family history of pancreatic cancer    History of kidney stones    Hyperlipidemia    Hypertension    Pneumonia    Prostate cancer ([place])     PAST SURGICAL HISTORY: Past Surgical History:  Procedure Laterality Date   EXTRACORPOREAL SHOCK WAVE LITHOTRIPSY Right 04/17/2017   Procedure: RIGHT EXTRACORPOREAL SHOCK WAVE LITHOTRIPSY (ESWL);  Surgeon: Bobby Rhodes, MD;  Location: [facility];  Service: Urology;  Laterality: Right;   HERNIA REPAIR     age 3   LEFT HEART CATH AND CORONARY ANGIOGRAPHY N/A 05/08/2020   Procedure: LEFT HEART CATH AND CORONARY ANGIOGRAPHY;  Surgeon: Bobby Prows, MD;  Location: [facility];  Service: Cardiovascular;  Laterality: N/A;   PROSTATE BIOPSY      FAMILY HISTORY: The patient family history includes Breast cancer in his maternal aunt; Breast cancer (age of onset: 81) in his sister; Kidney cancer in his maternal aunt; Pancreatic cancer (age of onset: 46) in his brother.  SOCIAL HISTORY:  The patient  reports that he quit smoking about 9 years ago. His smoking use included cigarettes. He has a 2.50 pack-year smoking history. He has never used smokeless tobacco. He reports that he does not drink alcohol and does not use drugs.  REVIEW OF SYSTEMS: Review of Systems  Constitutional: Positive for weight loss. Negative for chills, fever, malaise/fatigue and weight gain.  HENT:  Negative for hoarse voice and nosebleeds.   Eyes:  Negative for discharge, double vision and pain.  Cardiovascular:  Positive for dyspnea on exertion (improving). Negative for chest pain, claudication, leg swelling, near-syncope, orthopnea, palpitations, paroxysmal nocturnal dyspnea and syncope.  Respiratory:  Positive for shortness of breath (improving). Negative for hemoptysis.   Musculoskeletal:  Negative for muscle cramps and myalgias.  Gastrointestinal:  Negative for abdominal pain, constipation,  diarrhea, hematemesis, hematochezia, melena, nausea and vomiting.  Neurological:  Negative for dizziness and light-headedness.   PHYSICAL EXAM: Vitals with BMI 06/21/2021 06/19/2021 05/25/2021  Height 5\' 7"  5\' 7"  -  Weight 245 lbs 245 lbs 3 oz -  BMI 75.91 63.84 -  Systolic 665 993 -  Diastolic 74 66 -  Pulse 83 72 69   CONSTITUTIONAL: Well-developed and well-nourished. No acute distress.  SKIN: Skin is warm and dry. No rash noted. No cyanosis. No pallor. No jaundice HEAD: Normocephalic and atraumatic.  EYES: No scleral icterus MOUTH/THROAT: Moist oral membranes.  NECK: No JVD present. No thyromegaly noted. No carotid bruits  LYMPHATIC: No visible cervical adenopathy.  CHEST Normal respiratory effort. No intercostal retractions  LUNGS: Clear to auscultation bilaterally. No stridor. No wheezes. No rales.  CARDIOVASCULAR: Regular rate and rhythm, positive S1-S2, no murmurs rubs or gallops appreciated. ABDOMINAL: Obese, soft, nontender, nondistended, positive bowel sounds all 4 quadrants. No apparent ascites.  EXTREMITIES: No bilateral pitting peripheral edema.  Brace noted on the left lower extremity. HEMATOLOGIC: No significant bruising NEUROLOGIC: Oriented to person, place, and time. Nonfocal. Normal muscle tone.  PSYCHIATRIC: Normal mood and affect. Normal behavior.  Cooperative  RADIOLOGY: CT angio chest PE with and without contrast September 02, 2019:Cardiovascular: There is mild cardiomegaly. No pericardial effusion. There is multi vessel coronary vascular calcification. There is mild atherosclerotic calcification of the thoracic aorta.  CARDIAC DATABASE: EKG: 03/20/2021: Normal sinus rhythm, 69 bpm, first-degree AV block, nonspecific T wave abnormality, occasional PVCs.  Echocardiogram: 05/06/2020: LVEF 60-65%, hyperdynamic LVEF, moderate LVH, grade 2 diastolic impairment, elevated LVEDP, RV systolic function and size are within normal limits, trivial TR, trivial MR, mild to  moderate aortic valve sclerosis without stenosis.  Stress Testing: Lexiscan/modified Bruce Tetrofosmin stress test 02/23/2020: Stress EKG showed sinus tachycardia, inferolateral T wave inversion.  SPECT images show small sized, medium intensity, mid to basal inferolateral perfusion defect with mild reversibility. Stress LVEF 82%. Low risk study.  Coronary CTA 04/26/2020: 1. Coronary calcium score of 1101. This was 89th percentile for age and sex matched control. 2. Normal coronary origin with right dominance. 3.  Minimal calcified plaque within the left main artery. Moderate to severe stenosis within the proximal/mid LAD segment to calcified plaque. Severe stenosis at the ostial segment of the 1st diagonal branch due calcified plaque. Severe stenosis within the proximal segment due to eccentric calcified plaque within the LCX. Moderate stenosis within the distal RCA due to calcified plaque. Of note, severity of the stenosis may be overestimated due to blooming artifact caused by calcified plaque. 4. CADRADS = 4. Cardiac catheterization or CT FFR is recommended. Consider symptom-guided anti-ischemic pharmacotherapy as well as risk factor modification per guideline directed care. Invasive coronary angiography recommended with revascularization per published guideline statements. 4. Study is sent for CT-FFR findings will be performed and reported   CT-FFR 04/27/2020: CT FFR analysis showed no significant stenosis.  Heart Catheterization: Left Heart Catheterization 05/08/20:  Hyperdynamic LVEF, EF 65 to 70%.  Mildly elevated LV EDP. Mild diffuse coronary artery disease with mild coronary calcification involving LAD, circumflex and dominant RCA.  LAD is very small and gives origin to a very large LAD: D1 which reaches the apex. Recommendation: Patient symptoms are related to acute diastolic heart failure with elevated LVEDP.  LABORATORY DATA: CBC Latest Ref Rng & Units 06/19/2021 02/20/2021  10/19/2020  WBC 4.0 - 10.5 K/uL 8.6 11.4(H) 10.6(H)  Hemoglobin 13.0 - 17.0 g/dL 14.8 12.6(L) 13.0  Hematocrit 39.0 - 52.0 % 45.3 37.0(L) 39.7  Platelets 150 - 400 K/uL 279 262 228    CMP Latest Ref Rng & Units 06/19/2021 03/28/2021 02/20/2021  Glucose 70 - 99 mg/dL 131(H) 123(H) 129(H)  BUN 8 - 23 mg/dL 21 17 27(H)  Creatinine 0.61 - 1.24 mg/dL 1.26(H) 1.05 1.19  Sodium 135 - 145 mmol/L 140 141 139  Potassium 3.5 - 5.1 mmol/L 3.9 4.5 4.2  Chloride 98 - 111 mmol/L 104 104 106  CO2 22 - 32 mmol/L 25 22 22   Calcium 8.9 - 10.3 mg/dL 9.8 9.6 9.6  Total Protein 6.5 - 8.1 g/dL 6.9 - 6.8  Total Bilirubin 0.3 - 1.2 mg/dL 0.6 - 0.6  Alkaline Phos 38 - 126 U/L 106 - 98  AST 15 - 41 U/L 11(L) - 12(L)  ALT 0 - 44 U/L 12 - 11   Hepatic Function Panel Recent Labs    10/19/20 1317 02/20/21 1317 06/19/21 0922  PROT 7.0 6.8 6.9  ALBUMIN 4.0 3.6 3.9  AST 10* 12* 11*  ALT 10 11 12   ALKPHOS 117 98 106  BILITOT 0.4 0.6 0.6   External Labs: Lipid Panel 12/30/2019: total 155, Triglycerides 167, HDL  44, LDL 78  12/30/2019: A1C 5.4% Glucose Random 115 BUN 14, Creatinine, Serum 0.9  03/19/2019: PSA normal   06/05/2018: TSH 1.000  IMPRESSION:    ICD-10-CM   1. Chronic heart failure with preserved ejection fraction (HFpEF) (HCC)  I50.32     2. Nonobstructive atherosclerosis of coronary artery  I25.10     3. DOE (dyspnea on exertion)  R06.09     4. Mixed hyperlipidemia  E78.2     5. Former smoker  Z87.891     59. Benign hypertension  I10     7. Class 2 severe obesity due to excess calories with serious comorbidity and body mass index (BMI) of 36.0 to 36.9 in adult [facility])  E66.01    Z68.36        RECOMMENDATIONS: Jhamir Pickup. is a 70 y.o. male whose past medical history and cardiac risk factors include: Nonobstructive coronary artery disease, severe coronary artery calcification 1101 AU, chronic HFpEF/stage C/NYHA class II, prediabetes, hyperlipidemia, hypertension,  advanced age, former smoker, obesity due to excess calories.  Chronic heart failure preserved EF, stage C, NYHA class II: No recent hospitalizations for congestive heart failure. Lost 3 pounds since last visit.  Medications reconciled. RPM PCM data reviewed.  Monitor for now.   Coronary atherosclerosis due to calcified coronary lesions in the native artery without angina pectoris: Continue aspirin. Continue statin therapy. LDL goal is less than 70mg  /dL.  Educated on importance of improving modifiable cardiovascular risk factors for the prevention/progression of CAD No additional testing warranted at this time.  Benign essential hypertension: Office blood pressures are currently at goal. Medications reconciled. Reemphasized the importance of low salt diet.   Obstructive sleep apnea on CPAP: Patient is compliant with his CPAP usage and reinforced the importance.  Mixed hyperlipidemia: Continue lipitor.  Does not endorse any myalgias.  Goal LDL is less than 70 mg/dL.  Obesity, due to excess calories: Body mass index is 38.37 kg/m. I reviewed with the patient the importance of diet, regular physical activity/exercise, weight loss.   Patient is educated on increasing physical activity gradually as tolerated.  With the goal of moderate intensity exercise for 30 minutes a day 5 days a week.  Former smoker: Educated on the importance of continued smoking cessation.  FINAL MEDICATION LIST END OF ENCOUNTER: No orders of the defined types were placed in this encounter.    Current Outpatient Medications:    abiraterone acetate (ZYTIGA) 250 MG tablet, Take 4 tablets (1,000 mg total) by mouth daily. Take on an empty stomach 1 hour before or 2 hours after a meal, Disp: 120 tablet, Rfl: 0   arformoterol (BROVANA) 15 MCG/2ML NEBU, Take 2 mLs (15 mcg total) by nebulization 2 (two) times daily., Disp: 120 mL, Rfl: 6   aspirin EC 81 MG tablet, Take 1 tablet (81 mg total) by mouth daily., Disp:  90 tablet, Rfl: 3   atorvastatin (LIPITOR) 40 MG tablet, TAKE ONE TABLET (40 MG total) BY MOUTH DAILY, Disp: 90 tablet, Rfl: 0   budesonide (PULMICORT) 0.25 MG/2ML nebulizer solution, Take 2 mLs (0.25 mg total) by nebulization in the morning and at bedtime., Disp: 60 mL, Rfl: 12   diltiazem (CARDIZEM CD) 360 MG 24 hr capsule, TAKE ONE TABLET BY MOUTH ONCE DAILY, Disp: 90 capsule, Rfl: 0   JARDIANCE 10 MG TABS tablet, TAKE 1 Tablet BY MOUTH ONCE DAILY BEFORE breakfast., Disp: 30 tablet, Rfl: 2   lidocaine (XYLOCAINE) 2 % solution, Use as directed 15 mLs  in the mouth or throat as needed for mouth pain., Disp: 150 mL, Rfl: 0   methocarbamol (ROBAXIN) 500 MG tablet, Take 1 tablet by mouth daily as needed for muscle spasms. , Disp: , Rfl:    Multiple Vitamins-Minerals (MULTIVITAMIN WITH MINERALS) tablet, Take 1 tablet by mouth daily., Disp: , Rfl:    nystatin (MYCOSTATIN) 100000 UNIT/ML suspension, Take 5 mLs (500,000 Units total) by mouth 4 (four) times daily., Disp: 60 mL, Rfl: 0   predniSONE (DELTASONE) 5 MG tablet, Take 1 tablet (5 mg total) by mouth daily with breakfast., Disp: 90 tablet, Rfl: 0   sacubitril-valsartan (ENTRESTO) 49-51 MG, Take 1 tablet by mouth 2 (two) times daily., Disp: , Rfl:    solifenacin (VESICARE) 5 MG tablet, Take 5 mg by mouth daily., Disp: , Rfl:    tamsulosin (FLOMAX) 0.4 MG CAPS capsule, Take 1 capsule (0.4 mg total) by mouth daily after supper. (Patient taking differently: Take 0.4 mg by mouth at bedtime.), Disp: 30 capsule, Rfl: 0   traMADol (ULTRAM) 50 MG tablet, Take 50 mg by mouth 2 (two) times daily as needed for moderate pain. , Disp: , Rfl:   No orders of the defined types were placed in this encounter.  --Continue cardiac medications as reconciled in final medication list. --Return in about 3 months (around 09/21/2021) for Follow up, heart failure management.. Or sooner if needed. --Continue follow-up with your primary care physician regarding the management of  your other chronic comorbid conditions.  Total time spent: 20 minutes.  Patient's questions and concerns were addressed to his satisfaction. He voices understanding of the instructions provided during this encounter.   This note was created using a voice recognition software as a result there may be grammatical errors inadvertently enclosed that do not reflect the nature of this encounter. Every attempt is made to correct such errors.  Bobby [name], [place], [facility]  Pager: [phone] Office: [phone]

## 2021-06-26 ENCOUNTER — Encounter: Payer: Self-pay | Admitting: Cardiology

## 2021-06-27 ENCOUNTER — Encounter: Payer: Self-pay | Admitting: Oncology

## 2021-06-27 ENCOUNTER — Other Ambulatory Visit (HOSPITAL_COMMUNITY): Payer: Self-pay

## 2021-06-27 ENCOUNTER — Telehealth: Payer: Self-pay

## 2021-06-27 NOTE — Telephone Encounter (Signed)
Oral Oncology Patient Advocate Encounter  Was successful in securing patient a $8000 grant from [employer] to provide copayment coverage for Zytiga.  This will keep the out of pocket expense at $0.     [employer] ID: [id number]  I have spoken with the patient.   The billing information is as follows and has been shared with [place]: [id number] PCN: [id number] Member ID: [id number] Group ID: [id number] Dates of Eligibility: 05/28/21 through 05/27/22  Fund:  prostate   [place] [facility] [place] Phone [phone] Fax [phone] 06/27/2021 11:47 AM

## 2021-07-05 DIAGNOSIS — I5033 Acute on chronic diastolic (congestive) heart failure: Secondary | ICD-10-CM | POA: Diagnosis not present

## 2021-07-06 DIAGNOSIS — I5033 Acute on chronic diastolic (congestive) heart failure: Secondary | ICD-10-CM | POA: Diagnosis not present

## 2021-07-06 DIAGNOSIS — R7303 Prediabetes: Secondary | ICD-10-CM | POA: Diagnosis not present

## 2021-07-06 DIAGNOSIS — G4730 Sleep apnea, unspecified: Secondary | ICD-10-CM | POA: Diagnosis not present

## 2021-07-06 DIAGNOSIS — I700 Atherosclerosis of aorta: Secondary | ICD-10-CM | POA: Diagnosis not present

## 2021-07-14 DIAGNOSIS — I5032 Chronic diastolic (congestive) heart failure: Secondary | ICD-10-CM | POA: Diagnosis not present

## 2021-07-18 DIAGNOSIS — M4727 Other spondylosis with radiculopathy, lumbosacral region: Secondary | ICD-10-CM | POA: Diagnosis not present

## 2021-07-18 DIAGNOSIS — M79652 Pain in left thigh: Secondary | ICD-10-CM | POA: Diagnosis not present

## 2021-07-18 DIAGNOSIS — G894 Chronic pain syndrome: Secondary | ICD-10-CM | POA: Diagnosis not present

## 2021-07-19 ENCOUNTER — Other Ambulatory Visit: Payer: Self-pay | Admitting: Cardiology

## 2021-07-19 ENCOUNTER — Other Ambulatory Visit: Payer: Self-pay

## 2021-07-19 DIAGNOSIS — I5032 Chronic diastolic (congestive) heart failure: Secondary | ICD-10-CM

## 2021-07-19 DIAGNOSIS — R0609 Other forms of dyspnea: Secondary | ICD-10-CM

## 2021-07-19 MED ORDER — ENTRESTO 49-51 MG PO TABS: 1.0000 | ORAL_TABLET | Freq: Two times a day (BID) | ORAL | 3 refills | Status: DC

## 2021-07-20 ENCOUNTER — Telehealth: Payer: Self-pay | Admitting: Cardiology

## 2021-07-20 NOTE — Telephone Encounter (Signed)
Spoke to patient he is aware PA has been fax

## 2021-07-20 NOTE — Telephone Encounter (Signed)
Patient asking about status of patient assistance paperwork. Please call back at [phone].

## 2021-08-06 ENCOUNTER — Other Ambulatory Visit: Payer: Self-pay | Admitting: *Deleted

## 2021-08-06 ENCOUNTER — Other Ambulatory Visit: Payer: Self-pay

## 2021-08-06 DIAGNOSIS — I5032 Chronic diastolic (congestive) heart failure: Secondary | ICD-10-CM

## 2021-08-06 DIAGNOSIS — C61 Malignant neoplasm of prostate: Secondary | ICD-10-CM

## 2021-08-06 MED ORDER — ABIRATERONE ACETATE 250 MG PO TABS: 1000.0000 mg | ORAL_TABLET | Freq: Every day | ORAL | 0 refills | Status: DC

## 2021-08-06 MED ORDER — EMPAGLIFLOZIN 10 MG PO TABS: 10.0000 mg | ORAL_TABLET | Freq: Every day | ORAL | 3 refills | Status: AC

## 2021-08-13 DIAGNOSIS — M4696 Unspecified inflammatory spondylopathy, lumbar region: Secondary | ICD-10-CM | POA: Diagnosis not present

## 2021-08-13 DIAGNOSIS — I5032 Chronic diastolic (congestive) heart failure: Secondary | ICD-10-CM | POA: Diagnosis not present

## 2021-08-13 DIAGNOSIS — M5416 Radiculopathy, lumbar region: Secondary | ICD-10-CM | POA: Diagnosis not present

## 2021-08-14 DIAGNOSIS — M5416 Radiculopathy, lumbar region: Secondary | ICD-10-CM | POA: Diagnosis not present

## 2021-08-24 ENCOUNTER — Other Ambulatory Visit: Payer: Self-pay | Admitting: *Deleted

## 2021-08-24 DIAGNOSIS — C61 Malignant neoplasm of prostate: Secondary | ICD-10-CM

## 2021-08-24 MED ORDER — ABIRATERONE ACETATE 250 MG PO TABS
1000.0000 mg | ORAL_TABLET | Freq: Every day | ORAL | 0 refills | Status: DC
  Filled 2021-08-24 – 2021-09-17 (×2): qty 120, 30d supply, fill #0

## 2021-08-27 ENCOUNTER — Other Ambulatory Visit (HOSPITAL_COMMUNITY): Payer: Self-pay

## 2021-08-27 ENCOUNTER — Telehealth: Payer: Self-pay

## 2021-08-27 DIAGNOSIS — M545 Low back pain, unspecified: Secondary | ICD-10-CM | POA: Diagnosis not present

## 2021-08-27 NOTE — Telephone Encounter (Signed)
Oral Chemotherapy Pharmacist Encounter  Informed patient that we will be filling his Zytiga at [facility] and that we have grants to cover patients out of pocket costs. Patient states he has ~30 days of medication left. Medication refill scheduled for January 9th.   Drema Halon, PharmD Hematology/Oncology Clinical Pharmacist [facility] [phone]

## 2021-08-28 ENCOUNTER — Telehealth: Payer: Self-pay | Admitting: *Deleted

## 2021-08-28 NOTE — Telephone Encounter (Signed)
Returned PC to patient's daughter, Wynelle Link - she called stating her father has tested positive for covid buy is asymptomatic.  She is asking if Dr. Alen Blew would want to prescribe any antiviral medication for her father.  Informed daughter Dr. Alen Blew advises that the patient contact his PCP for any medications for covid.  She verbalizes understanding.

## 2021-09-07 ENCOUNTER — Telehealth: Payer: Self-pay

## 2021-09-07 NOTE — Telephone Encounter (Signed)
Patient was not approved for patient assistance for Bobby [name]. Delene Loll was approved. please advise

## 2021-09-08 NOTE — Telephone Encounter (Signed)
Please apply for patient assistance in the new year.   ST

## 2021-09-11 ENCOUNTER — Other Ambulatory Visit (HOSPITAL_COMMUNITY): Payer: Self-pay

## 2021-09-11 ENCOUNTER — Encounter: Payer: Self-pay | Admitting: Oncology

## 2021-09-17 ENCOUNTER — Other Ambulatory Visit (HOSPITAL_COMMUNITY): Payer: Self-pay

## 2021-09-19 ENCOUNTER — Other Ambulatory Visit (HOSPITAL_COMMUNITY): Payer: Self-pay

## 2021-09-20 ENCOUNTER — Encounter: Payer: Self-pay | Admitting: Oncology

## 2021-09-27 ENCOUNTER — Encounter: Payer: Self-pay | Admitting: Oncology

## 2021-09-27 ENCOUNTER — Other Ambulatory Visit: Payer: Self-pay

## 2021-09-27 ENCOUNTER — Ambulatory Visit: Payer: Medicare PPO | Admitting: Cardiology

## 2021-09-27 ENCOUNTER — Encounter: Payer: Self-pay | Admitting: Cardiology

## 2021-09-27 VITALS — BP 114/57 | HR 88 | Temp 98.0°F | Resp 16 | Ht 67.0 in | Wt 242.0 lb

## 2021-09-27 DIAGNOSIS — Z6837 Body mass index (BMI) 37.0-37.9, adult: Secondary | ICD-10-CM

## 2021-09-27 DIAGNOSIS — Z87891 Personal history of nicotine dependence: Secondary | ICD-10-CM

## 2021-09-27 DIAGNOSIS — R0609 Other forms of dyspnea: Secondary | ICD-10-CM

## 2021-09-27 DIAGNOSIS — I5032 Chronic diastolic (congestive) heart failure: Secondary | ICD-10-CM

## 2021-09-27 DIAGNOSIS — I10 Essential (primary) hypertension: Secondary | ICD-10-CM

## 2021-09-27 DIAGNOSIS — I2510 Atherosclerotic heart disease of native coronary artery without angina pectoris: Secondary | ICD-10-CM

## 2021-09-27 DIAGNOSIS — E782 Mixed hyperlipidemia: Secondary | ICD-10-CM

## 2021-09-27 MED ORDER — ENTRESTO 49-51 MG PO TABS: 2.0000 | ORAL_TABLET | Freq: Two times a day (BID) | ORAL | 3 refills | Status: DC

## 2021-09-27 NOTE — Progress Notes (Signed)
ID:  [name]., DOB 1950-11-05, MRN [id number]  PCP:  [name], MD  Cardiologist: Rex Kras, DO, [facility] (established care 01/21/2020)  Date: 09/27/21 Last Office Visit: 06/21/2021  Chief Complaint  Patient presents with   heart failure management   Follow-up    Bobby [name]. is a 71 y.o. male who presents to the office with a  chief complaint of " 14-month follow-up for heart failure management."  His past medical history and cardiovascular risk factors are: Nonobstructive coronary artery disease, severe coronary artery calcification 1101 AU, chronic HFpEF/stage C/NYHA class II, COVID infection (08/2021), prediabetes, hyperlipidemia, hypertension, advanced age, former smoker, obesity due to excess calories.  Patient is accompanied today by his wife [name].   Patient was originally referred to the office for evaluation of  coronary artery calcification.   Patient has undergone ischemic evaluation as outlined below.  He has been followed by the practice for management of HFpEF.  Patient is secondary to medical therapy has been uptitrated in a stepwise fashion and based on renal function.  He now presents for 80-month follow-up visit.  Denies any hospitalizations or urgent care visits for cardiovascular symptoms.  Denies anginal discomfort.  He does have baseline shortness of breath with effort related activities which is due to his underlying HFpEF, recent COVID-19 infection, deconditioning.  He also has bilateral lower extremity swelling.  He does wear his compression stockings daily but chose not to for today's visit.  He continues to take torsemide on as-needed basis.  His weight is 3 pounds less compared to last office visit.  FUNCTIONAL STATUS: Walking more with grandkids, as per patient's wife.  ALLERGIES: No Known Allergies  MEDICATION LIST PRIOR TO VISIT: Current Meds  Medication Sig   abiraterone acetate (ZYTIGA) 250 MG tablet Take 1,000 mg by mouth  daily. Take on an empty stomach 1 hour before or 2 hours after a meal   arformoterol (BROVANA) 15 MCG/2ML NEBU Take 2 mLs (15 mcg total) by nebulization 2 (two) times daily.   aspirin EC 81 MG tablet Take 1 tablet (81 mg total) by mouth daily.   atorvastatin (LIPITOR) 40 MG tablet TAKE ONE TABLET BY MOUTH (40 MG total) DAILY   budesonide (PULMICORT) 0.25 MG/2ML nebulizer solution Take 2 mLs (0.25 mg total) by nebulization in the morning and at bedtime.   diltiazem (CARDIZEM CD) 360 MG 24 hr capsule TAKE ONE CAPSULE BY MOUTH ONCE DAILY   empagliflozin (JARDIANCE) 10 MG TABS tablet Take 1 tablet (10 mg total) by mouth daily before breakfast.   lidocaine (XYLOCAINE) 2 % solution Use as directed 15 mLs in the mouth or throat as needed for mouth pain.   methocarbamol (ROBAXIN) 500 MG tablet Take 1 tablet by mouth daily as needed for muscle spasms.    Multiple Vitamins-Minerals (MULTIVITAMIN WITH MINERALS) tablet Take 1 tablet by mouth daily.   nystatin (MYCOSTATIN) 100000 UNIT/ML suspension Take 5 mLs (500,000 Units total) by mouth 4 (four) times daily.   predniSONE (DELTASONE) 10 MG tablet Take 10 mg by mouth daily with breakfast.   solifenacin (VESICARE) 5 MG tablet Take 5 mg by mouth daily.   tamsulosin (FLOMAX) 0.4 MG CAPS capsule Take 1 capsule (0.4 mg total) by mouth daily after supper. (Patient taking differently: Take 0.4 mg by mouth at bedtime.)   traMADol (ULTRAM) 50 MG tablet Take 50 mg by mouth 2 (two) times daily as needed for moderate pain.    [DISCONTINUED] sacubitril-valsartan (ENTRESTO) 49-51 MG Take  1 tablet by mouth 2 (two) times daily.     PAST MEDICAL HISTORY: Past Medical History:  Diagnosis Date   Bronchitis    CHF (congestive heart failure) ([place]) 04/2020   A/C HFpEF   Coronary artery calcification of native artery    Family history of pancreatic cancer    History of kidney stones    Hyperlipidemia    Hypertension    Pneumonia    Prostate cancer ([place])     PAST  SURGICAL HISTORY: Past Surgical History:  Procedure Laterality Date   EXTRACORPOREAL SHOCK WAVE LITHOTRIPSY Right 04/17/2017   Procedure: RIGHT EXTRACORPOREAL SHOCK WAVE LITHOTRIPSY (ESWL);  Surgeon: Kathie Rhodes, MD;  Location: [facility];  Service: Urology;  Laterality: Right;   HERNIA REPAIR     age 46   LEFT HEART CATH AND CORONARY ANGIOGRAPHY N/A 05/08/2020   Procedure: LEFT HEART CATH AND CORONARY ANGIOGRAPHY;  Surgeon: Adrian Prows, MD;  Location: [facility];  Service: Cardiovascular;  Laterality: N/A;   PROSTATE BIOPSY      FAMILY HISTORY: The patient family history includes Breast cancer in his maternal aunt; Breast cancer (age of onset: 26) in his sister; Kidney cancer in his maternal aunt; Pancreatic cancer (age of onset: 55) in his brother.  SOCIAL HISTORY:  The patient  reports that he quit smoking about 9 years ago. His smoking use included cigarettes. He has a 2.50 pack-year smoking history. He has never used smokeless tobacco. He reports that he does not drink alcohol and does not use drugs.  REVIEW OF SYSTEMS: Review of Systems  Constitutional: Positive for weight loss. Negative for chills, fever, malaise/fatigue and weight gain.  HENT:  Negative for hoarse voice and nosebleeds.   Eyes:  Negative for discharge, double vision and pain.  Cardiovascular:  Positive for dyspnea on exertion (improving) and leg swelling. Negative for chest pain, claudication, near-syncope, orthopnea, palpitations, paroxysmal nocturnal dyspnea and syncope.  Respiratory:  Positive for shortness of breath (improving). Negative for hemoptysis.   Musculoskeletal:  Negative for muscle cramps and myalgias.  Gastrointestinal:  Negative for abdominal pain, constipation, diarrhea, hematemesis, hematochezia, melena, nausea and vomiting.  Neurological:  Negative for dizziness and light-headedness.   PHYSICAL EXAM: Vitals with BMI 09/27/2021 06/21/2021 06/19/2021  Height 5\' 7"  5\' 7"  5\' 7"   Weight 242 lbs  245 lbs 245 lbs 3 oz  BMI 37.89 58.85 02.77  Systolic 412 878 676  Diastolic 57 74 66  Pulse 88 83 72   CONSTITUTIONAL: Well-developed and well-nourished. No acute distress.  SKIN: Skin is warm and dry. No rash noted. No cyanosis. No pallor. No jaundice HEAD: Normocephalic and atraumatic.  EYES: No scleral icterus MOUTH/THROAT: Moist oral membranes.  NECK: No JVD present. No thyromegaly noted. No carotid bruits  LYMPHATIC: No visible cervical adenopathy.  CHEST Normal respiratory effort. No intercostal retractions  LUNGS: Clear to auscultation bilaterally. No stridor. No wheezes. No rales.  CARDIOVASCULAR: Regular rate and rhythm, positive S1-S2, no murmurs rubs or gallops appreciated. ABDOMINAL: Obese, soft, nontender, nondistended, positive bowel sounds all 4 quadrants. No apparent ascites.  EXTREMITIES: +1 bilateral pitting peripheral edema.  HEMATOLOGIC: No significant bruising NEUROLOGIC: Oriented to person, place, and time. Nonfocal. Normal muscle tone.  PSYCHIATRIC: Normal mood and affect. Normal behavior. Cooperative  RADIOLOGY: CT angio chest PE with and without contrast September 02, 2019:Cardiovascular: There is mild cardiomegaly. No pericardial effusion. There is multi vessel coronary vascular calcification. There is mild atherosclerotic calcification of the thoracic aorta.  CARDIAC DATABASE: EKG: 09/27/2021:  NSR, 81bpm, first degree AV Block, diffuse nonspecific T wave abnormalities.   Echocardiogram: 05/06/2020: LVEF 60-65%, hyperdynamic LVEF, moderate LVH, grade 2 diastolic impairment, elevated LVEDP, RV systolic function and size are within normal limits, trivial TR, trivial MR, mild to moderate aortic valve sclerosis without stenosis.  Stress Testing: Lexiscan/modified Bruce Tetrofosmin stress test 02/23/2020: Stress EKG showed sinus tachycardia, inferolateral T wave inversion.  SPECT images show small sized, medium intensity, mid to basal inferolateral perfusion  defect with mild reversibility. Stress LVEF 82%. Low risk study.  Coronary CTA 04/26/2020: 1. Coronary calcium score of 1101. This was 89th percentile for age and sex matched control. 2. Normal coronary origin with right dominance. 3.  Minimal calcified plaque within the left main artery. Moderate to severe stenosis within the proximal/mid LAD segment to calcified plaque. Severe stenosis at the ostial segment of the 1st diagonal branch due calcified plaque. Severe stenosis within the proximal segment due to eccentric calcified plaque within the LCX. Moderate stenosis within the distal RCA due to calcified plaque. Of note, severity of the stenosis may be overestimated due to blooming artifact caused by calcified plaque. 4. CADRADS = 4. Cardiac catheterization or CT FFR is recommended. Consider symptom-guided anti-ischemic pharmacotherapy as well as risk factor modification per guideline directed care. Invasive coronary angiography recommended with revascularization per published guideline statements. 4. Study is sent for CT-FFR findings will be performed and reported   CT-FFR 04/27/2020: CT FFR analysis showed no significant stenosis.  Heart Catheterization: Left Heart Catheterization 05/08/20:  Hyperdynamic LVEF, EF 65 to 70%.  Mildly elevated LV EDP. Mild diffuse coronary artery disease with mild coronary calcification involving LAD, circumflex and dominant RCA.  LAD is very small and gives origin to a very large LAD: D1 which reaches the apex. Recommendation: Patient symptoms are related to acute diastolic heart failure with elevated LVEDP.  LABORATORY DATA: CBC Latest Ref Rng & Units 06/19/2021 02/20/2021 10/19/2020  WBC 4.0 - 10.5 K/uL 8.6 11.4(H) 10.6(H)  Hemoglobin 13.0 - 17.0 g/dL 14.8 12.6(L) 13.0  Hematocrit 39.0 - 52.0 % 45.3 37.0(L) 39.7  Platelets 150 - 400 K/uL 279 262 228    CMP Latest Ref Rng & Units 06/19/2021 03/28/2021 02/20/2021  Glucose 70 - 99 mg/dL 131(H) 123(H)  129(H)  BUN 8 - 23 mg/dL 21 17 27(H)  Creatinine 0.61 - 1.24 mg/dL 1.26(H) 1.05 1.19  Sodium 135 - 145 mmol/L 140 141 139  Potassium 3.5 - 5.1 mmol/L 3.9 4.5 4.2  Chloride 98 - 111 mmol/L 104 104 106  CO2 22 - 32 mmol/L 25 22 22   Calcium 8.9 - 10.3 mg/dL 9.8 9.6 9.6  Total Protein 6.5 - 8.1 g/dL 6.9 - 6.8  Total Bilirubin 0.3 - 1.2 mg/dL 0.6 - 0.6  Alkaline Phos 38 - 126 U/L 106 - 98  AST 15 - 41 U/L 11(L) - 12(L)  ALT 0 - 44 U/L 12 - 11   Hepatic Function Panel Recent Labs    10/19/20 1317 02/20/21 1317 06/19/21 0922  PROT 7.0 6.8 6.9  ALBUMIN 4.0 3.6 3.9  AST 10* 12* 11*  ALT 10 11 12   ALKPHOS 117 98 106  BILITOT 0.4 0.6 0.6   External Labs: Lipid Panel 12/30/2019: total 155, Triglycerides 167, HDL 44, LDL 78  12/30/2019: A1C 5.4% Glucose Random 115 BUN 14, Creatinine, Serum 0.9  03/19/2019: PSA normal   06/05/2018: TSH 1.000  IMPRESSION:    ICD-10-CM   1. Chronic heart failure with preserved ejection fraction (HFpEF) (HCC)  I50.32  EKG 12-Lead    sacubitril-valsartan (ENTRESTO) 49-51 MG    Basic metabolic panel    Magnesium    Pro b natriuretic peptide (BNP)    2. Nonobstructive atherosclerosis of coronary artery  I25.10     3. DOE (dyspnea on exertion)  R06.09     4. Mixed hyperlipidemia  E78.2     5. Benign hypertension  I10     6. Former smoker  Z87.891     40. Class 2 severe obesity due to excess calories with serious comorbidity and body mass index (BMI) of 37.0 to 37.9 in adult [facility])  E66.01    Z68.37        RECOMMENDATIONS: Karlon Schlafer. is a 71 y.o. male whose past medical history and cardiac risk factors include: Nonobstructive coronary artery disease, severe coronary artery calcification 1101 AU, chronic HFpEF/stage C/NYHA class II, prediabetes, hyperlipidemia, hypertension, advanced age, former smoker, obesity due to excess calories.  Chronic heart failure preserved EF, stage C, NYHA class II: No recent hospitalizations for  congestive heart failure. Lost additional 3 pounds since last visit.  Medications reconciled. RPM PCM data reviewed.  Will order baseline blood work today.  If renal function is stable patient is asked to increase Entresto 49/51 mg 2 tablets twice a day for 1 week with repeat blood work.  The second set of blood work is relatively stable with the higher dose of Entresto we will send in a prescription for 97/103 mg 1 tablet twice a day. Encouraged him to use compression stockings when able for about 4 to 6 hours daily. Monitor for now.   Coronary atherosclerosis due to calcified coronary lesions in the native artery without angina pectoris: Continue aspirin. Continue statin therapy. LDL goal is less than 70mg  /dL.  Educated on importance of improving modifiable cardiovascular risk factors for the prevention/progression of CAD No additional testing warranted at this time.  Benign essential hypertension: Office blood pressures are currently at goal. Medications reconciled. Reemphasized the importance of low salt diet.   Obstructive sleep apnea on CPAP: Patient is compliant with his CPAP usage and reinforced the importance.  Mixed hyperlipidemia: Continue lipitor.  Does not endorse any myalgias.  Goal LDL is less than 70 mg/dL.  Obesity, due to excess calories: Body mass index is 37.9 kg/m. I reviewed with the patient the importance of diet, regular physical activity/exercise, weight loss.   Patient is educated on increasing physical activity gradually as tolerated.  With the goal of moderate intensity exercise for 30 minutes a day 5 days a week.  Former smoker: Educated on the importance of continued smoking cessation.  FINAL MEDICATION LIST END OF ENCOUNTER: Meds ordered this encounter  Medications   sacubitril-valsartan (ENTRESTO) 49-51 MG    Sig: Take 2 tablets by mouth 2 (two) times daily.    Dispense:  90 tablet    Refill:  3     Current Outpatient Medications:     abiraterone acetate (ZYTIGA) 250 MG tablet, Take 1,000 mg by mouth daily. Take on an empty stomach 1 hour before or 2 hours after a meal, Disp: , Rfl:    arformoterol (BROVANA) 15 MCG/2ML NEBU, Take 2 mLs (15 mcg total) by nebulization 2 (two) times daily., Disp: 120 mL, Rfl: 6   aspirin EC 81 MG tablet, Take 1 tablet (81 mg total) by mouth daily., Disp: 90 tablet, Rfl: 3   atorvastatin (LIPITOR) 40 MG tablet, TAKE ONE TABLET BY MOUTH (40 MG total) DAILY, Disp: 90 tablet,  Rfl: 0   budesonide (PULMICORT) 0.25 MG/2ML nebulizer solution, Take 2 mLs (0.25 mg total) by nebulization in the morning and at bedtime., Disp: 60 mL, Rfl: 12   diltiazem (CARDIZEM CD) 360 MG 24 hr capsule, TAKE ONE CAPSULE BY MOUTH ONCE DAILY, Disp: 90 capsule, Rfl: 0   empagliflozin (JARDIANCE) 10 MG TABS tablet, Take 1 tablet (10 mg total) by mouth daily before breakfast., Disp: 90 tablet, Rfl: 3   lidocaine (XYLOCAINE) 2 % solution, Use as directed 15 mLs in the mouth or throat as needed for mouth pain., Disp: 150 mL, Rfl: 0   methocarbamol (ROBAXIN) 500 MG tablet, Take 1 tablet by mouth daily as needed for muscle spasms. , Disp: , Rfl:    Multiple Vitamins-Minerals (MULTIVITAMIN WITH MINERALS) tablet, Take 1 tablet by mouth daily., Disp: , Rfl:    nystatin (MYCOSTATIN) 100000 UNIT/ML suspension, Take 5 mLs (500,000 Units total) by mouth 4 (four) times daily., Disp: 60 mL, Rfl: 0   predniSONE (DELTASONE) 10 MG tablet, Take 10 mg by mouth daily with breakfast., Disp: , Rfl:    solifenacin (VESICARE) 5 MG tablet, Take 5 mg by mouth daily., Disp: , Rfl:    tamsulosin (FLOMAX) 0.4 MG CAPS capsule, Take 1 capsule (0.4 mg total) by mouth daily after supper. (Patient taking differently: Take 0.4 mg by mouth at bedtime.), Disp: 30 capsule, Rfl: 0   traMADol (ULTRAM) 50 MG tablet, Take 50 mg by mouth 2 (two) times daily as needed for moderate pain. , Disp: , Rfl:    sacubitril-valsartan (ENTRESTO) 49-51 MG, Take 2 tablets by mouth 2  (two) times daily., Disp: 90 tablet, Rfl: 3  Orders Placed This Encounter  Procedures   Basic metabolic panel   Magnesium   Pro b natriuretic peptide (BNP)   EKG 12-Lead    --Continue cardiac medications as reconciled in final medication list. --Return in about 6 months (around 03/27/2022) for Follow up, heart failure management.. Or sooner if needed. --Continue follow-up with your primary care physician regarding the management of your other chronic comorbid conditions.  Patient's questions and concerns were addressed to his satisfaction. He voices understanding of the instructions provided during this encounter.   This note was created using a voice recognition software as a result there may be grammatical errors inadvertently enclosed that do not reflect the nature of this encounter. Every attempt is made to correct such errors.  Rex Kras, [place], [facility]  Pager: [phone] Office: [phone]

## 2021-09-28 LAB — PRO B NATRIURETIC PEPTIDE: NT-Pro BNP: 1131 pg/mL — ABNORMAL HIGH (ref 0–376)

## 2021-09-28 LAB — BASIC METABOLIC PANEL
BUN/Creatinine Ratio: 22 (ref 10–24)
BUN: 25 mg/dL (ref 8–27)
CO2: 18 mmol/L — ABNORMAL LOW (ref 20–29)
Calcium: 10.2 mg/dL (ref 8.6–10.2)
Chloride: 108 mmol/L — ABNORMAL HIGH (ref 96–106)
Creatinine, Ser: 1.13 mg/dL (ref 0.76–1.27)
Glucose: 123 mg/dL — ABNORMAL HIGH (ref 70–99)
Potassium: 4.7 mmol/L (ref 3.5–5.2)
Sodium: 143 mmol/L (ref 134–144)
eGFR: 70 mL/min/{1.73_m2} (ref >59)

## 2021-09-28 LAB — MAGNESIUM: Magnesium: 2.4 mg/dL — ABNORMAL HIGH (ref 1.6–2.3)

## 2021-10-02 ENCOUNTER — Telehealth: Payer: Self-pay | Admitting: Pharmacist

## 2021-10-02 DIAGNOSIS — I5032 Chronic diastolic (congestive) heart failure: Secondary | ICD-10-CM

## 2021-10-02 NOTE — Telephone Encounter (Signed)
Pt called to discuss recent lab results. Renal function renal function WNL and stable. NT-pro BNP noted to be significantly elevated. Pt notes that he hasnt noticed any recent complains of worsening lower extremity edema, worsening dyspnea, SOB complains. Pt does report the only recent change being a steroid injection for his back last week. Pt currently taking entresto 49/51 mg BID and plan was to increase to 97/103 mg if renal function is stable. Reviewed with pt that will discuss with Dr. Terri Skains to confirm increasing entresto dose and getting repeat labs completed in 1 week.

## 2021-10-03 MED ORDER — ENTRESTO 97-103 MG PO TABS: 1.0000 | ORAL_TABLET | Freq: Two times a day (BID) | ORAL | 0 refills | Status: DC

## 2021-10-12 ENCOUNTER — Other Ambulatory Visit (HOSPITAL_COMMUNITY): Payer: Self-pay

## 2021-10-13 LAB — BASIC METABOLIC PANEL
BUN/Creatinine Ratio: 16 (ref 10–24)
BUN: 17 mg/dL (ref 8–27)
CO2: 21 mmol/L (ref 20–29)
Calcium: 9.5 mg/dL (ref 8.6–10.2)
Chloride: 106 mmol/L (ref 96–106)
Creatinine, Ser: 1.04 mg/dL (ref 0.76–1.27)
Glucose: 99 mg/dL (ref 70–99)
Potassium: 4.4 mmol/L (ref 3.5–5.2)
Sodium: 142 mmol/L (ref 134–144)
eGFR: 78 mL/min/{1.73_m2} (ref >59)

## 2021-10-13 LAB — MAGNESIUM: Magnesium: 2.3 mg/dL (ref 1.6–2.3)

## 2021-10-13 LAB — PRO B NATRIURETIC PEPTIDE: NT-Pro BNP: 201 pg/mL (ref 0–376)

## 2021-10-15 ENCOUNTER — Other Ambulatory Visit: Payer: Self-pay | Admitting: Oncology

## 2021-10-15 ENCOUNTER — Telehealth: Payer: Self-pay | Admitting: Oncology

## 2021-10-15 ENCOUNTER — Other Ambulatory Visit (HOSPITAL_COMMUNITY): Payer: Self-pay

## 2021-10-15 DIAGNOSIS — C61 Malignant neoplasm of prostate: Secondary | ICD-10-CM

## 2021-10-15 MED ORDER — ABIRATERONE ACETATE 250 MG PO TABS
1000.0000 mg | ORAL_TABLET | Freq: Every day | ORAL | 0 refills | Status: DC
  Filled 2021-10-15: qty 120, 30d supply, fill #0

## 2021-10-15 NOTE — Progress Notes (Signed)
Reviewed results with pt. Pt reports to be tolerating Entresto 97/103 mg BID. Home BP and weight continues to remain stable and controlled. Pt reports that lower extremity edema complains and SOB have improved. Pt worried about his upcoming cortisone injection to manage his chronic back pain management. Pt concerned about it effects on his DM and CHF management. Pt with significant back pain and discomfort and only able to get moderate relief from the cortisone injections. Reviewed with pt that if the benefits of the injections overweight the potential side effects, it may be reasonable to continue with the cortisone injections. Pt to discuss it further with the spine specialist.

## 2021-10-15 NOTE — Telephone Encounter (Signed)
Rescheduled 02/15 appointment to 03/01 due to provider pal, patient has been called and notified of upcoming appointments.

## 2021-10-17 ENCOUNTER — Other Ambulatory Visit (HOSPITAL_COMMUNITY): Payer: Self-pay

## 2021-10-22 ENCOUNTER — Other Ambulatory Visit: Payer: Self-pay

## 2021-10-22 DIAGNOSIS — I5032 Chronic diastolic (congestive) heart failure: Secondary | ICD-10-CM

## 2021-10-22 MED ORDER — ENTRESTO 97-103 MG PO TABS: 1.0000 | ORAL_TABLET | Freq: Two times a day (BID) | ORAL | 3 refills | Status: DC

## 2021-10-23 ENCOUNTER — Other Ambulatory Visit: Payer: Self-pay | Admitting: Cardiology

## 2021-10-23 DIAGNOSIS — I5032 Chronic diastolic (congestive) heart failure: Secondary | ICD-10-CM

## 2021-10-23 DIAGNOSIS — R0609 Other forms of dyspnea: Secondary | ICD-10-CM

## 2021-10-24 ENCOUNTER — Inpatient Hospital Stay: Payer: Medicare Other

## 2021-10-24 ENCOUNTER — Inpatient Hospital Stay: Payer: Medicare Other | Admitting: Oncology

## 2021-10-31 ENCOUNTER — Encounter: Payer: Self-pay | Admitting: Oncology

## 2021-11-01 ENCOUNTER — Other Ambulatory Visit (HOSPITAL_COMMUNITY): Payer: Self-pay

## 2021-11-05 ENCOUNTER — Encounter: Payer: Self-pay | Admitting: Oncology

## 2021-11-07 ENCOUNTER — Inpatient Hospital Stay: Payer: Medicare PPO | Attending: Oncology

## 2021-11-07 ENCOUNTER — Inpatient Hospital Stay: Payer: Medicare PPO | Admitting: Oncology

## 2021-11-07 ENCOUNTER — Inpatient Hospital Stay: Payer: Medicare PPO

## 2021-11-07 ENCOUNTER — Other Ambulatory Visit: Payer: Self-pay

## 2021-11-07 VITALS — BP 122/67 | HR 91 | Temp 97.7°F | Resp 19 | Ht 67.0 in | Wt 249.3 lb

## 2021-11-07 DIAGNOSIS — C61 Malignant neoplasm of prostate: Secondary | ICD-10-CM | POA: Insufficient documentation

## 2021-11-07 DIAGNOSIS — Z5111 Encounter for antineoplastic chemotherapy: Secondary | ICD-10-CM | POA: Diagnosis present

## 2021-11-07 DIAGNOSIS — Z79899 Other long term (current) drug therapy: Secondary | ICD-10-CM | POA: Diagnosis not present

## 2021-11-07 LAB — CMP (CANCER CENTER ONLY)
ALT: 10 U/L (ref 0–44)
AST: 10 U/L — ABNORMAL LOW (ref 15–41)
Albumin: 3.8 g/dL (ref 3.5–5.0)
Alkaline Phosphatase: 89 U/L (ref 38–126)
Anion gap: 7 (ref 5–15)
BUN: 18 mg/dL (ref 8–23)
CO2: 24 mmol/L (ref 22–32)
Calcium: 9.1 mg/dL (ref 8.9–10.3)
Chloride: 109 mmol/L (ref 98–111)
Creatinine: 0.92 mg/dL (ref 0.61–1.24)
GFR, Estimated: >60 mL/min (ref >60)
Glucose, Bld: 129 mg/dL — ABNORMAL HIGH (ref 70–99)
Potassium: 3.8 mmol/L (ref 3.5–5.1)
Sodium: 140 mmol/L (ref 135–145)
Total Bilirubin: 0.4 mg/dL (ref 0.3–1.2)
Total Protein: 6.3 g/dL — ABNORMAL LOW (ref 6.5–8.1)

## 2021-11-07 LAB — CBC WITH DIFFERENTIAL (CANCER CENTER ONLY)
Abs Immature Granulocytes: 0.02 10*3/uL (ref 0.00–0.07)
Basophils Absolute: 0 10*3/uL (ref 0.0–0.1)
Basophils Relative: 0 %
Eosinophils Absolute: 0.2 10*3/uL (ref 0.0–0.5)
Eosinophils Relative: 2 %
HCT: 36.1 % — ABNORMAL LOW (ref 39.0–52.0)
Hemoglobin: 11.3 g/dL — ABNORMAL LOW (ref 13.0–17.0)
Immature Granulocytes: 0 %
Lymphocytes Relative: 13 %
Lymphs Abs: 1.1 10*3/uL (ref 0.7–4.0)
MCH: 23.9 pg — ABNORMAL LOW (ref 26.0–34.0)
MCHC: 31.3 g/dL (ref 30.0–36.0)
MCV: 76.5 fL — ABNORMAL LOW (ref 80.0–100.0)
Monocytes Absolute: 0.6 10*3/uL (ref 0.1–1.0)
Monocytes Relative: 7 %
Neutro Abs: 6.9 10*3/uL (ref 1.7–7.7)
Neutrophils Relative %: 78 %
Platelet Count: 223 10*3/uL (ref 150–400)
RBC: 4.72 MIL/uL (ref 4.22–5.81)
RDW: 17 % — ABNORMAL HIGH (ref 11.5–15.5)
WBC Count: 8.9 10*3/uL (ref 4.0–10.5)
nRBC: 0 % (ref 0.0–0.2)

## 2021-11-07 MED ORDER — LEUPROLIDE ACETATE (4 MONTH) 30 MG ~~LOC~~ KIT
30.0000 mg | PACK | Freq: Once | SUBCUTANEOUS | Status: AC
  Administered 2021-11-07: 30 mg via SUBCUTANEOUS
  Filled 2021-11-07: qty 30

## 2021-11-07 NOTE — Progress Notes (Signed)
Hematology and Oncology Follow Up Visit ? ?Bobby [name]. ?[id number] ?December 01, 1950 71 y.o. ?11/07/2021 8:53 AM ?Bobby [name], MDSouth, Bobby Main, MD  ? 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  He was found to have Gleason score 4+5 = 9 and PSA of 14.7 at the time of diagnosis. ? ? ?Prior Therapy: He is status post prostate biopsy in August 2018. ? ?Current therapy: ? ?Eligard 30 mg every 4 months.  Next injection will be given on November 07, 2020. ? ?Zytiga 1000 mg daily with prednisone 5 mg daily started in October 2018. ? ?Interim History: [name] is here for repeat evaluation.  Since the last visit, he reports feeling well without any major complaints.  He has tolerated Zytiga without any issues.  He denies any nausea, vomiting or abdominal pain.  He denies any worsening edema or bone pain.  He denies any falls or syncope.  He continues to eat well and has gained more weight since the last visit. ? ? ? ? ?Medications: Reviewed without changes. ?Current Outpatient Medications  ?Medication Sig Dispense Refill  ? abiraterone acetate (ZYTIGA) 250 MG tablet Take 1,000 mg by mouth daily. Take on an empty stomach 1 hour before or 2 hours after a meal    ? abiraterone acetate (ZYTIGA) 250 MG tablet Take 4 tablets (1,000 mg total) by mouth daily. Take on an empty stomach 1 hour before or 2 hours after a meal 120 tablet 0  ? arformoterol (BROVANA) 15 MCG/2ML NEBU Take 2 mLs (15 mcg total) by nebulization 2 (two) times daily. 120 mL 6  ? aspirin EC 81 MG tablet Take 1 tablet (81 mg total) by mouth daily. 90 tablet 3  ? atorvastatin (LIPITOR) 40 MG tablet TAKE ONE TABLET BY MOUTH (40 MG total) DAILY 90 tablet 0  ? budesonide (PULMICORT) 0.25 MG/2ML nebulizer solution Take 2 mLs (0.25 mg total) by nebulization in the morning and at bedtime. 60 mL 12  ? diltiazem (CARDIZEM CD) 360 MG 24 hr capsule TAKE ONE CAPSULE BY MOUTH ONCE DAILY 90  capsule 0  ? empagliflozin (JARDIANCE) 10 MG TABS tablet Take 1 tablet (10 mg total) by mouth daily before breakfast. 90 tablet 3  ? lidocaine (XYLOCAINE) 2 % solution Use as directed 15 mLs in the mouth or throat as needed for mouth pain. 150 mL 0  ? methocarbamol (ROBAXIN) 500 MG tablet Take 1 tablet by mouth daily as needed for muscle spasms.     ? Multiple Vitamins-Minerals (MULTIVITAMIN WITH MINERALS) tablet Take 1 tablet by mouth daily.    ? nystatin (MYCOSTATIN) 100000 UNIT/ML suspension Take 5 mLs (500,000 Units total) by mouth 4 (four) times daily. 60 mL 0  ? predniSONE (DELTASONE) 10 MG tablet Take 10 mg by mouth daily with breakfast.    ? sacubitril-valsartan (ENTRESTO) 97-103 MG Take 1 tablet by mouth 2 (two) times daily. 180 tablet 3  ? solifenacin (VESICARE) 5 MG tablet Take 5 mg by mouth daily.    ? tamsulosin (FLOMAX) 0.4 MG CAPS capsule Take 1 capsule (0.4 mg total) by mouth daily after supper. (Patient taking differently: Take 0.4 mg by mouth at bedtime.) 30 capsule 0  ? traMADol (ULTRAM) 50 MG tablet Take 50 mg by mouth 2 (two) times daily as needed for moderate pain.     ? ?No current facility-administered medications for this visit.  ? ? ? ?Allergies: No Known Allergies ? ? ? ? ?Physical Exam: ? ? ? ?  Blood pressure 122/67, pulse 91, temperature 97.7 ?F (36.5 ?C), temperature source Temporal, resp. rate 19, height 5\' 7"  (1.702 m), weight 249 lb 4.8 oz (113.1 kg), SpO2 100 %. ? ? ? ? ? ? ?ECOG: 1 ?  ? ?General appearance: Alert, awake without any distress. ?Head: Atraumatic without abnormalities ?Oropharynx: Without any thrush or ulcers. ?Eyes: No scleral icterus. ?Lymph nodes: No lymphadenopathy noted in the cervical, supraclavicular, or axillary nodes ?Heart:regular rate and rhythm, without any murmurs or gallops.   ?Lung: Clear to auscultation without any rhonchi, wheezes or dullness to percussion. ?Abdomin: Soft, nontender without any shifting dullness or ascites. ?Musculoskeletal: No clubbing  or cyanosis. ?Neurological: No motor or sensory deficits. ?Skin: No rashes or lesions. ? ? ? ? ? ? ? ? ? ? ? ? ? ? ?Lab Results: ?Lab Results  ?Component Value Date  ? WBC 8.6 06/19/2021  ? HGB 14.8 06/19/2021  ? HCT 45.3 06/19/2021  ? MCV 83.9 06/19/2021  ? PLT 279 06/19/2021  ? ?  Chemistry   ?   ?Component Value Date/Time  ? NA 142 10/12/2021 1151  ? NA 142 09/10/2017 1509  ? K 4.4 10/12/2021 1151  ? K 4.2 09/10/2017 1509  ? CL 106 10/12/2021 1151  ? CO2 21 10/12/2021 1151  ? CO2 25 09/10/2017 1509  ? BUN 17 10/12/2021 1151  ? BUN 19.4 09/10/2017 1509  ? CREATININE 1.04 10/12/2021 1151  ? CREATININE 1.26 (H) 06/19/2021 0174  ? CREATININE 1.2 09/10/2017 1509  ?    ?Component Value Date/Time  ? CALCIUM 9.5 10/12/2021 1151  ? CALCIUM 9.7 09/10/2017 1509  ? ALKPHOS 106 06/19/2021 0922  ? ALKPHOS 84 09/10/2017 1509  ? AST 11 (L) 06/19/2021 9449  ? AST 14 09/10/2017 1509  ? ALT 12 06/19/2021 0922  ? ALT 18 09/10/2017 1509  ? BILITOT 0.6 06/19/2021 0922  ? BILITOT 0.38 09/10/2017 1509  ?  ? ?  ?  ? ? ? ? ? Latest Reference Range & Units 02/20/21 13:17 06/19/21 09:22  ?Prostate Specific Ag, Serum 0.0 - 4.0 ng/mL <0.1 <0.1  ? ? ? ? ? ? ? ?Impression and Plan: ? ?71 year old man with: ?  ?1.  Advanced prostate cancer with lymphadenopathy diagnosed in 2018.  He has castration-sensitive disease. ? ?He continues to tolerate Zytiga without any major complications.  Risks and benefits of continuing this treatment were reviewed.  Complications include hypertension, edema and adrenal sufficiency were reviewed.  Alternative treatment options including systemic chemotherapy were also discussed but deferred at this time. ? ? ?2.  Androgen deprivation therapy: He will continue Eligard indefinitely.  Complication clinic weight gain, sexual dysfunction, hot flashes were discussed.  He is agreeable to continue. ? ?3.  Hypertension: No issues reported at this time related to [facility] and we will continue to monitor. ? ?4.  Electrolyte and  liver function test monitoring: We will continue to monitor potassium and liver function test.  No issues reported at this time. ? ?5.  Prognosis and goals of care: His disease is incurable although aggressive measures are warranted given his excellent performance status. ? ?6.  Weight gain: We discussed strategies to improve his exercise and weight loss. We emphasize cardiovascular health and the importance of healthier lifestyle associated with long-term androgen deprivation. ? ? ?7.  Follow-up: He will return in 4 months for a follow-up visit. ?  ?30  minutes were dedicated to this encounter.  The time was spent on reviewing laboratory data, disease status update,  treatment choices and addressing complications related to cancer and cancer therapy. ? ? ? ?Zola Button, MD ?3/1/20238:53 AM ?

## 2021-11-08 ENCOUNTER — Telehealth: Payer: Self-pay | Admitting: *Deleted

## 2021-11-08 ENCOUNTER — Other Ambulatory Visit (HOSPITAL_COMMUNITY): Payer: Self-pay

## 2021-11-08 LAB — PROSTATE-SPECIFIC AG, SERUM (LABCORP): Prostate Specific Ag, Serum: <0.1 ng/mL (ref 0.0–4.0)

## 2021-11-08 NOTE — Telephone Encounter (Signed)
-----   Message from Wyatt Portela, MD sent at 11/08/2021  9:31 AM EST ----- ?Please let him know his PSA is down ?

## 2021-11-08 NOTE — Telephone Encounter (Signed)
Per Dr.Shadad, called pt with message below. Pt verbalized understanding.  ?

## 2021-11-12 ENCOUNTER — Other Ambulatory Visit: Payer: Self-pay | Admitting: Oncology

## 2021-11-12 ENCOUNTER — Other Ambulatory Visit (HOSPITAL_COMMUNITY): Payer: Self-pay

## 2021-11-12 DIAGNOSIS — C61 Malignant neoplasm of prostate: Secondary | ICD-10-CM

## 2021-11-12 MED ORDER — ABIRATERONE ACETATE 250 MG PO TABS
1000.0000 mg | ORAL_TABLET | Freq: Every day | ORAL | 0 refills | Status: DC
  Filled 2021-11-22: qty 120, 30d supply, fill #0

## 2021-11-14 ENCOUNTER — Other Ambulatory Visit (HOSPITAL_COMMUNITY): Payer: Self-pay

## 2021-11-15 ENCOUNTER — Telehealth: Payer: Self-pay | Admitting: Oncology

## 2021-11-15 NOTE — Telephone Encounter (Signed)
Scheduled per 03/01 los, patient has been called and notified. ?

## 2021-11-19 ENCOUNTER — Other Ambulatory Visit (HOSPITAL_COMMUNITY): Payer: Self-pay

## 2021-11-22 ENCOUNTER — Other Ambulatory Visit (HOSPITAL_COMMUNITY): Payer: Self-pay

## 2021-12-13 ENCOUNTER — Other Ambulatory Visit (HOSPITAL_COMMUNITY): Payer: Self-pay

## 2021-12-13 ENCOUNTER — Other Ambulatory Visit: Payer: Self-pay | Admitting: Oncology

## 2021-12-13 DIAGNOSIS — C61 Malignant neoplasm of prostate: Secondary | ICD-10-CM

## 2021-12-13 MED ORDER — ABIRATERONE ACETATE 250 MG PO TABS
1000.0000 mg | ORAL_TABLET | Freq: Every day | ORAL | 0 refills | Status: DC
  Filled 2021-12-13: qty 120, 30d supply, fill #0

## 2021-12-19 ENCOUNTER — Other Ambulatory Visit (HOSPITAL_COMMUNITY): Payer: Self-pay

## 2021-12-20 ENCOUNTER — Emergency Department (HOSPITAL_COMMUNITY): Payer: Medicare PPO

## 2021-12-20 ENCOUNTER — Other Ambulatory Visit: Payer: Self-pay

## 2021-12-20 ENCOUNTER — Inpatient Hospital Stay (HOSPITAL_COMMUNITY)
Admission: EM | Admit: 2021-12-20 | Discharge: 2021-12-24 | DRG: 392 | Disposition: A | Discharge status: Home / Self Care | Payer: Medicare PPO | Attending: Family Medicine | Admitting: Family Medicine

## 2021-12-20 ENCOUNTER — Encounter (HOSPITAL_COMMUNITY): Payer: Self-pay | Admitting: Emergency Medicine

## 2021-12-20 DIAGNOSIS — Z7952 Long term (current) use of systemic steroids: Secondary | ICD-10-CM

## 2021-12-20 DIAGNOSIS — I10 Essential (primary) hypertension: Secondary | ICD-10-CM | POA: Diagnosis present

## 2021-12-20 DIAGNOSIS — Z8701 Personal history of pneumonia (recurrent): Secondary | ICD-10-CM | POA: Diagnosis not present

## 2021-12-20 DIAGNOSIS — Z7982 Long term (current) use of aspirin: Secondary | ICD-10-CM

## 2021-12-20 DIAGNOSIS — E782 Mixed hyperlipidemia: Secondary | ICD-10-CM | POA: Diagnosis present

## 2021-12-20 DIAGNOSIS — Z87442 Personal history of urinary calculi: Secondary | ICD-10-CM

## 2021-12-20 DIAGNOSIS — Z800 Family history of malignant neoplasm of digestive organs: Secondary | ICD-10-CM | POA: Diagnosis not present

## 2021-12-20 DIAGNOSIS — G4733 Obstructive sleep apnea (adult) (pediatric): Secondary | ICD-10-CM | POA: Diagnosis present

## 2021-12-20 DIAGNOSIS — I2510 Atherosclerotic heart disease of native coronary artery without angina pectoris: Secondary | ICD-10-CM | POA: Diagnosis present

## 2021-12-20 DIAGNOSIS — E669 Obesity, unspecified: Secondary | ICD-10-CM | POA: Diagnosis present

## 2021-12-20 DIAGNOSIS — Z6837 Body mass index (BMI) 37.0-37.9, adult: Secondary | ICD-10-CM

## 2021-12-20 DIAGNOSIS — I110 Hypertensive heart disease with heart failure: Secondary | ICD-10-CM | POA: Diagnosis present

## 2021-12-20 DIAGNOSIS — E876 Hypokalemia: Secondary | ICD-10-CM | POA: Diagnosis present

## 2021-12-20 DIAGNOSIS — K567 Ileus, unspecified: Secondary | ICD-10-CM | POA: Diagnosis not present

## 2021-12-20 DIAGNOSIS — C61 Malignant neoplasm of prostate: Secondary | ICD-10-CM | POA: Diagnosis present

## 2021-12-20 DIAGNOSIS — K5792 Diverticulitis of intestine, part unspecified, without perforation or abscess without bleeding: Secondary | ICD-10-CM | POA: Diagnosis present

## 2021-12-20 DIAGNOSIS — Z7984 Long term (current) use of oral hypoglycemic drugs: Secondary | ICD-10-CM

## 2021-12-20 DIAGNOSIS — I5032 Chronic diastolic (congestive) heart failure: Secondary | ICD-10-CM

## 2021-12-20 DIAGNOSIS — K5732 Diverticulitis of large intestine without perforation or abscess without bleeding: Secondary | ICD-10-CM

## 2021-12-20 DIAGNOSIS — R0682 Tachypnea, not elsewhere classified: Secondary | ICD-10-CM

## 2021-12-20 DIAGNOSIS — Z7951 Long term (current) use of inhaled steroids: Secondary | ICD-10-CM | POA: Diagnosis not present

## 2021-12-20 DIAGNOSIS — Z79899 Other long term (current) drug therapy: Secondary | ICD-10-CM

## 2021-12-20 DIAGNOSIS — Z87891 Personal history of nicotine dependence: Secondary | ICD-10-CM | POA: Diagnosis not present

## 2021-12-20 LAB — COMPREHENSIVE METABOLIC PANEL
ALT: 11 U/L (ref 0–44)
AST: 14 U/L — ABNORMAL LOW (ref 15–41)
Albumin: 4 g/dL (ref 3.5–5.0)
Alkaline Phosphatase: 86 U/L (ref 38–126)
Anion gap: 9 (ref 5–15)
BUN: 15 mg/dL (ref 8–23)
CO2: 23 mmol/L (ref 22–32)
Calcium: 9.1 mg/dL (ref 8.9–10.3)
Chloride: 105 mmol/L (ref 98–111)
Creatinine, Ser: 0.76 mg/dL (ref 0.61–1.24)
GFR, Estimated: >60 mL/min (ref >60)
Glucose, Bld: 105 mg/dL — ABNORMAL HIGH (ref 70–99)
Potassium: 3.2 mmol/L — ABNORMAL LOW (ref 3.5–5.1)
Sodium: 137 mmol/L (ref 135–145)
Total Bilirubin: 1.2 mg/dL (ref 0.3–1.2)
Total Protein: 7.3 g/dL (ref 6.5–8.1)

## 2021-12-20 LAB — CBC
HCT: 41.2 % (ref 39.0–52.0)
Hemoglobin: 13.1 g/dL (ref 13.0–17.0)
MCH: 24 pg — ABNORMAL LOW (ref 26.0–34.0)
MCHC: 31.8 g/dL (ref 30.0–36.0)
MCV: 75.5 fL — ABNORMAL LOW (ref 80.0–100.0)
Platelets: 229 10*3/uL (ref 150–400)
RBC: 5.46 MIL/uL (ref 4.22–5.81)
RDW: 18.7 % — ABNORMAL HIGH (ref 11.5–15.5)
WBC: 17.5 10*3/uL — ABNORMAL HIGH (ref 4.0–10.5)
nRBC: 0 % (ref 0.0–0.2)

## 2021-12-20 LAB — URINALYSIS, ROUTINE W REFLEX MICROSCOPIC
Bilirubin Urine: NEGATIVE
Glucose, UA: >=500 mg/dL — AB
Ketones, ur: 80 mg/dL — AB
Leukocytes,Ua: NEGATIVE
Nitrite: NEGATIVE
Protein, ur: 30 mg/dL — AB
Specific Gravity, Urine: 1.005 (ref 1.005–1.030)
pH: 5 (ref 5.0–8.0)

## 2021-12-20 LAB — BLOOD GAS, VENOUS
Acid-base deficit: 3.6 mmol/L — ABNORMAL HIGH (ref 0.0–2.0)
Bicarbonate: 20.4 mmol/L (ref 20.0–28.0)
O2 Saturation: 74.7 %
Patient temperature: 37
pCO2, Ven: 33 mmHg — ABNORMAL LOW (ref 44–60)
pH, Ven: 7.4 (ref 7.25–7.43)
pO2, Ven: 42 mmHg (ref 32–45)

## 2021-12-20 LAB — LACTIC ACID, PLASMA: Lactic Acid, Venous: 1.5 mmol/L (ref 0.5–1.9)

## 2021-12-20 LAB — LIPASE, BLOOD: Lipase: 28 U/L (ref 11–51)

## 2021-12-20 MED ORDER — SACUBITRIL-VALSARTAN 49-51 MG PO TABS
1.0000 | ORAL_TABLET | Freq: Two times a day (BID) | ORAL | Status: DC
  Administered 2021-12-20 – 2021-12-24 (×8): 1 via ORAL
  Filled 2021-12-20 (×8): qty 1

## 2021-12-20 MED ORDER — ONDANSETRON HCL 4 MG/2ML IJ SOLN
4.0000 mg | Freq: Four times a day (QID) | INTRAMUSCULAR | Status: DC | PRN
Start: 2021-12-20 — End: 2021-12-24
  Administered 2021-12-20 – 2021-12-21 (×2): 4 mg via INTRAVENOUS
  Filled 2021-12-20 (×2): qty 2

## 2021-12-20 MED ORDER — PREDNISONE 5 MG PO TABS
10.0000 mg | ORAL_TABLET | Freq: Every day | ORAL | Status: DC
  Administered 2021-12-21: 10 mg via ORAL
  Filled 2021-12-20 (×3): qty 2

## 2021-12-20 MED ORDER — IOHEXOL 300 MG/ML  SOLN
100.0000 mL | Freq: Once | INTRAMUSCULAR | Status: AC | PRN
  Administered 2021-12-20: 100 mL via INTRAVENOUS

## 2021-12-20 MED ORDER — TAMSULOSIN HCL 0.4 MG PO CAPS
0.4000 mg | ORAL_CAPSULE | Freq: Every day | ORAL | Status: DC
  Administered 2021-12-20 – 2021-12-23 (×4): 0.4 mg via ORAL
  Filled 2021-12-20 (×4): qty 1

## 2021-12-20 MED ORDER — ACETAMINOPHEN 325 MG PO TABS: 650.0000 mg | ORAL_TABLET | Freq: Four times a day (QID) | ORAL | Status: DC | PRN

## 2021-12-20 MED ORDER — ONDANSETRON 4 MG PO TBDP
4.0000 mg | ORAL_TABLET | Freq: Once | ORAL | Status: AC
  Administered 2021-12-20: 4 mg via ORAL
  Filled 2021-12-20: qty 1

## 2021-12-20 MED ORDER — HYDRALAZINE HCL 25 MG PO TABS: 25.0000 mg | ORAL_TABLET | Freq: Four times a day (QID) | ORAL | Status: DC | PRN

## 2021-12-20 MED ORDER — ABIRATERONE ACETATE 250 MG PO TABS
1000.0000 mg | ORAL_TABLET | Freq: Every day | ORAL | Status: DC
  Administered 2021-12-22 – 2021-12-23 (×2): 1000 mg via ORAL

## 2021-12-20 MED ORDER — DARIFENACIN HYDROBROMIDE ER 7.5 MG PO TB24
7.5000 mg | ORAL_TABLET | Freq: Every day | ORAL | Status: DC
  Administered 2021-12-20 – 2021-12-24 (×5): 7.5 mg via ORAL
  Filled 2021-12-20 (×5): qty 1

## 2021-12-20 MED ORDER — ADULT MULTIVITAMIN W/MINERALS CH
1.0000 | ORAL_TABLET | Freq: Every day | ORAL | Status: DC
  Administered 2021-12-20: 1 via ORAL
  Filled 2021-12-20 (×4): qty 1

## 2021-12-20 MED ORDER — ONDANSETRON HCL 4 MG PO TABS
4.0000 mg | ORAL_TABLET | Freq: Four times a day (QID) | ORAL | Status: DC | PRN
  Administered 2021-12-21: 4 mg via ORAL
  Filled 2021-12-20: qty 1

## 2021-12-20 MED ORDER — IOHEXOL 9 MG/ML PO SOLN
ORAL | Status: AC
  Filled 2021-12-20: qty 1000

## 2021-12-20 MED ORDER — SODIUM CHLORIDE 0.9 % IV SOLN: INTRAVENOUS | Status: DC | PRN

## 2021-12-20 MED ORDER — LACTATED RINGERS IV BOLUS
1000.0000 mL | Freq: Once | INTRAVENOUS | Status: AC
  Administered 2021-12-20: 1000 mL via INTRAVENOUS

## 2021-12-20 MED ORDER — HYDROMORPHONE HCL 1 MG/ML IJ SOLN
1.0000 mg | Freq: Once | INTRAMUSCULAR | Status: AC
  Administered 2021-12-20: 1 mg via INTRAVENOUS
  Filled 2021-12-20: qty 1

## 2021-12-20 MED ORDER — MORPHINE SULFATE (PF) 2 MG/ML IV SOLN
2.0000 mg | INTRAVENOUS | Status: DC | PRN
  Administered 2021-12-20 – 2021-12-21 (×2): 2 mg via INTRAVENOUS
  Filled 2021-12-20 (×2): qty 1

## 2021-12-20 MED ORDER — FUROSEMIDE 10 MG/ML IJ SOLN
40.0000 mg | Freq: Once | INTRAMUSCULAR | Status: AC
  Administered 2021-12-20: 40 mg via INTRAVENOUS
  Filled 2021-12-20: qty 4

## 2021-12-20 MED ORDER — HYDROCODONE-ACETAMINOPHEN 5-325 MG PO TABS
1.0000 | ORAL_TABLET | ORAL | Status: DC | PRN
  Administered 2021-12-20: 2 via ORAL
  Administered 2021-12-22: 1 via ORAL
  Filled 2021-12-20 (×2): qty 1
  Filled 2021-12-20: qty 2

## 2021-12-20 MED ORDER — DILTIAZEM HCL ER COATED BEADS 120 MG PO CP24
360.0000 mg | ORAL_CAPSULE | Freq: Every day | ORAL | Status: DC
  Administered 2021-12-20 – 2021-12-24 (×4): 360 mg via ORAL
  Filled 2021-12-20 (×5): qty 3

## 2021-12-20 MED ORDER — ACETAMINOPHEN 650 MG RE SUPP
650.0000 mg | Freq: Four times a day (QID) | RECTAL | Status: DC | PRN
Start: 2021-12-20 — End: 2021-12-24

## 2021-12-20 MED ORDER — POTASSIUM CHLORIDE CRYS ER 20 MEQ PO TBCR
40.0000 meq | EXTENDED_RELEASE_TABLET | Freq: Once | ORAL | Status: AC
  Administered 2021-12-20: 40 meq via ORAL
  Filled 2021-12-20: qty 2

## 2021-12-20 MED ORDER — GABAPENTIN 300 MG PO CAPS: 300.0000 mg | ORAL_CAPSULE | Freq: Three times a day (TID) | ORAL | Status: DC | PRN

## 2021-12-20 MED ORDER — PIPERACILLIN-TAZOBACTAM 3.375 G IVPB
3.3750 g | Freq: Three times a day (TID) | INTRAVENOUS | Status: DC
Start: 2021-12-21 — End: 2021-12-24
  Administered 2021-12-21 – 2021-12-24 (×11): 3.375 g via INTRAVENOUS
  Filled 2021-12-20 (×12): qty 50

## 2021-12-20 MED ORDER — ASPIRIN EC 81 MG PO TBEC
81.0000 mg | DELAYED_RELEASE_TABLET | Freq: Every day | ORAL | Status: DC
  Administered 2021-12-20 – 2021-12-24 (×4): 81 mg via ORAL
  Filled 2021-12-20 (×5): qty 1

## 2021-12-20 MED ORDER — SODIUM CHLORIDE (PF) 0.9 % IJ SOLN
INTRAMUSCULAR | Status: AC
  Filled 2021-12-20: qty 50

## 2021-12-20 MED ORDER — PIPERACILLIN-TAZOBACTAM 3.375 G IVPB
3.3750 g | Freq: Once | INTRAVENOUS | Status: AC
  Administered 2021-12-20: 3.375 g via INTRAVENOUS
  Filled 2021-12-20: qty 50

## 2021-12-20 MED ORDER — METHOCARBAMOL 500 MG PO TABS
500.0000 mg | ORAL_TABLET | Freq: Every day | ORAL | Status: DC | PRN
  Administered 2021-12-20: 500 mg via ORAL
  Filled 2021-12-20: qty 1

## 2021-12-20 MED ORDER — ATORVASTATIN CALCIUM 40 MG PO TABS
40.0000 mg | ORAL_TABLET | Freq: Every day | ORAL | Status: DC
  Administered 2021-12-20 – 2021-12-24 (×4): 40 mg via ORAL
  Filled 2021-12-20 (×5): qty 1

## 2021-12-20 MED ORDER — ENOXAPARIN SODIUM 60 MG/0.6ML IJ SOSY
50.0000 mg | PREFILLED_SYRINGE | INTRAMUSCULAR | Status: DC
  Administered 2021-12-20 – 2021-12-23 (×4): 50 mg via SUBCUTANEOUS
  Filled 2021-12-20 (×4): qty 0.6

## 2021-12-20 NOTE — ED Triage Notes (Signed)
Patient reports lower abdominal pain for 2 days which was tender upon palpation. He reports distention and inability to have a bowel movement in 2 days.  ? ? ? ?EMS vitals: ?172/74 BP ?94 HR ?18 RR ?96% SPO2 on room air ?

## 2021-12-20 NOTE — Assessment & Plan Note (Addendum)
Patient follows with Dr. Alen Blew as an outpatient.  He is currently on Eligard every 4 months in addition to [place] daily with prednisone Continue Zytiga and prednisone on discharge. ?

## 2021-12-20 NOTE — Progress Notes (Signed)
A consult was received from an ED physician for Zosyn per pharmacy dosing.  The patient's profile has been reviewed for ht/wt/allergies/indication/available labs.   ?A one time order has been placed for Zosyn 3.375g IV x1.  Further antibiotics/pharmacy consults should be ordered by admitting physician if indicated.       ?                ?Thank you, ? ?Dimple Nanas, PharmD ?12/20/2021 4:19 PM ? ?

## 2021-12-20 NOTE — H&P (Signed)
?History and Physical  ? ? ?Patient: Bobby [name]. DGL:[id number] DOB: [birth year] ?DOA: 12/20/2021 ?DOS: the patient was seen and examined on 12/20/2021 ?PCP: [name], MD  ?Patient coming from: Home ? ?Chief Complaint:  ?Chief Complaint  ?Patient presents with  ? Abdominal Pain  ? ?HPI: Bobby [name]. is a 71 y.o. male with medical history significant of prostate cancer, heart failure, hypertension, hyperlipidemia. Patient reports hurting in right lower abdomen for the last few days which has progressively worsened. Moving around helped with his pain. He did not take any medication to help with his symptoms. He reports no bowel movement in the last two days which was concerning for constipation. Episode of emesis in the ED. No fever, chills or sweats. ? ?Review of Systems: As mentioned in the history of present illness. All other systems reviewed and are negative. ?Past Medical History:  ?Diagnosis Date  ? Bronchitis   ? CHF (congestive heart failure) ([place]) 04/2020  ? A/C HFpEF  ? Coronary artery calcification of native artery   ? Family history of pancreatic cancer   ? History of kidney stones   ? Hyperlipidemia   ? Hypertension   ? Pneumonia   ? Prostate cancer ([place])   ? ?Past Surgical History:  ?Procedure Laterality Date  ? EXTRACORPOREAL SHOCK WAVE LITHOTRIPSY Right 04/17/2017  ? Procedure: RIGHT EXTRACORPOREAL SHOCK WAVE LITHOTRIPSY (ESWL);  Surgeon: Kathie Rhodes, MD;  Location: [facility];  Service: Urology;  Laterality: Right;  ? HERNIA REPAIR    ? age 33  ? LEFT HEART CATH AND CORONARY ANGIOGRAPHY N/A 05/08/2020  ? Procedure: LEFT HEART CATH AND CORONARY ANGIOGRAPHY;  Surgeon: Adrian Prows, MD;  Location: [facility];  Service: Cardiovascular;  Laterality: N/A;  ? PROSTATE BIOPSY    ? ?Social History:  reports that he quit smoking about 9 years ago. His smoking use included cigarettes. He has a 2.50 pack-year smoking history. He has never used smokeless tobacco. He reports that he does  not drink alcohol and does not use drugs. ? ?No Known Allergies ? ?Family History  ?Problem Relation Age of Onset  ? Breast cancer Sister 27  ?     "negative genetic testing"  ? Pancreatic cancer Brother 30  ? Breast cancer Maternal Aunt   ? Kidney cancer Maternal Aunt   ?     dx 75s  ? ? ?Prior to Admission medications   ?Medication Sig Start Date End Date Taking? Authorizing Provider  ?abiraterone acetate (ZYTIGA) 250 MG tablet Take 1,000 mg by mouth daily. Take on an empty stomach 1 hour before or 2 hours after a meal    [provider]  ?abiraterone acetate (ZYTIGA) 250 MG tablet Take 4 tablets (1,000 mg total) by mouth daily. Take on an empty stomach 1 hour before or 2 hours after a meal 12/13/21   Wyatt Portela, MD  ?arformoterol (BROVANA) 15 MCG/2ML NEBU Take 2 mLs (15 mcg total) by nebulization 2 (two) times daily. 11/29/20   Freddi Starr, MD  ?aspirin EC 81 MG tablet Take 1 tablet (81 mg total) by mouth daily. 01/21/20   Tolia, Sunit, DO  ?atorvastatin (LIPITOR) 40 MG tablet TAKE ONE TABLET BY MOUTH (40 MG total) DAILY 10/23/21   Tolia, Sunit, DO  ?budesonide (PULMICORT) 0.25 MG/2ML nebulizer solution Take 2 mLs (0.25 mg total) by nebulization in the morning and at bedtime. 05/30/20   Freddi Starr, MD  ?diltiazem (CARDIZEM CD) 360 MG 24 hr capsule  TAKE ONE CAPSULE BY MOUTH ONCE DAILY 10/23/21   Terri Skains, Sunit, DO  ?empagliflozin (JARDIANCE) 10 MG TABS tablet Take 1 tablet (10 mg total) by mouth daily before breakfast. 08/06/21   Tolia, Sunit, DO  ?ENTRESTO 49-51 MG Take 1 tablet by mouth 2 (two) times daily. 10/09/21   [provider]  ?lidocaine (LIDODERM) 5 % SMARTSIG:Topical 12/14/21   [provider]  ?lidocaine (XYLOCAINE) 2 % solution Use as directed 15 mLs in the mouth or throat as needed for mouth pain. 05/25/21   Domenic Moras, PA-C  ?meloxicam (MOBIC) 15 MG tablet Take 15 mg by mouth daily. 12/12/21   [provider]  ?methocarbamol (ROBAXIN) 500 MG tablet Take 1  tablet by mouth daily as needed for muscle spasms.     [provider]  ?Multiple Vitamins-Minerals (MULTIVITAMIN WITH MINERALS) tablet Take 1 tablet by mouth daily.    [provider]  ?NEURONTIN 300 MG capsule Take by mouth 3 (three) times daily as needed. 11/09/21   [provider]  ?nystatin (MYCOSTATIN) 100000 UNIT/ML suspension Take 5 mLs (500,000 Units total) by mouth 4 (four) times daily. 05/25/21   Domenic Moras, PA-C  ?predniSONE (DELTASONE) 10 MG tablet Take 10 mg by mouth daily with breakfast.    [provider]  ?predniSONE (DELTASONE) 5 MG tablet Take 5 mg by mouth every morning. 12/12/21   [provider]  ?sacubitril-valsartan (ENTRESTO) 97-103 MG Take 1 tablet by mouth 2 (two) times daily. 10/22/21 01/20/22  Tolia, Sunit, DO  ?solifenacin (VESICARE) 5 MG tablet Take 5 mg by mouth daily. 04/03/20   [provider]  ?tamsulosin (FLOMAX) 0.4 MG CAPS capsule Take 1 capsule (0.4 mg total) by mouth daily after supper. ?Patient taking differently: Take 0.4 mg by mouth at bedtime. 04/17/17   Kathie Rhodes, MD  ?traMADol (ULTRAM) 50 MG tablet Take 50 mg by mouth 2 (two) times daily as needed for moderate pain.  03/29/20   [provider]  ? ? ?Physical Exam: ?Vitals:  ? 12/20/21 1511 12/20/21 1547 12/20/21 1549 12/20/21 1623  ?BP:  (!) 129/58    ?Pulse:  91 90   ?Resp: (!) 26 (!) 31 (!) 32   ?Temp:      ?TempSrc:      ?SpO2:  94% 92%   ?Weight:    106.7 kg  ? ?General exam: Appears calm and comfortable ?Respiratory system: Clear to auscultation. Respiratory effort normal. ?Cardiovascular system: S1 & S2 heard, RRR. No murmurs. ?Gastrointestinal system: Abdomen is distended, soft and significant tender in right lower quadrant. Normal bowel sounds heard. ?Central nervous system: Alert and oriented. No focal neurological deficits. ?Musculoskeletal: Trace bilateral lower extremity edema. No calf tenderness ?Skin: No cyanosis. No rashes ?Psychiatry: Judgement and  insight appear normal. Mood & affect appropriate.  ? ?Data Reviewed: ? ?BMP significant for a potassium of 3.2 and a glucose of 105.  CBC significant for white blood cell count of 17,500, MCV of 75.5.  Lactic acid of 1.5.  Venous blood gas with a pH of 7.4 and PCO2 of 33. ? ?Assessment and Plan: ?* Acute diverticulitis ?Patient with associated leukocytosis.  Afebrile.  CT abdomen/pelvis significant for acute sigmoid diverticulitis and midline of lower abdomen/upper pelvis without evidence of abscess.  Zosyn initiated in the emergency department. ?-Obtain blood cultures prior to IV antibiotics ?-Continue IV Zosyn ?-Clear liquid diet and advance as tolerated to a soft diet ?-Analgesic support ?-Hold IV fluids for now secondary to prior tachypnea and requirement for  Lasix IV ? ?Chronic heart failure with preserved ejection fraction ([place]) ?Patient follows with Dr. Terri Skains as an outpatient. LVEF of 60-65% on most recent Transthoracic Echocardiogram with grade 2 diastolic dysfunction.  Patient is on Entresto as an outpatient.  Stable. ? ?Coronary artery disease involving native coronary artery of native heart ?Patient is on aspirin and Lipitor as an outpatient. ?-Resume home aspirin and Lipitor ? ?Hypokalemia ?Potassium of 3.2 on BMP. ?-K-Dur 40 milliequivalents x1 ? ?Essential hypertension ?Blood pressure currently well controlled.  Patient is on Entresto and diltiazem as an outpatient. ?-Resume home Entresto and diltiazem ? ?Tachypnea-resolved as of 12/20/2021 ?Seems likely related to IV fluid resuscitation initially provided to the patient.  Symptoms significantly improved after IV Lasix. ? ?Obesity (BMI 30-39.9) ?Body mass index is 36.86 kg/m?. ? ?Obstructive sleep apnea ?-Continue CPAP nightly ? ?Mixed hyperlipidemia ?-Continue home Lipitor ? ?Malignant neoplasm of prostate ([place]) ?Patient follows with Dr. Alen Blew as an outpatient.  He is currently on Eligard every 4 months in addition to Zytiga daily with  prednisone ?-Continue Zytiga and prednisone ? ? ? Advance Care Planning: Full code ? ?Consults: None ? ?Family Communication: Wife at bedside ? ?Author: ?Cordelia Poche, MD ?12/20/2021 5:16 PM ? ?For on call review [email or web address]

## 2021-12-20 NOTE — Assessment & Plan Note (Addendum)
Patient with associated leukocytosis.  Afebrile.  CT abdomen/pelvis significant for acute sigmoid diverticulitis and midline of lower abdomen/upper pelvis without evidence of abscess.  Zosyn initiated in the emergency department.  Leukocytosis resolved. Ileus seen on abdominal x-ray.  Patient transition to Augmentin to complete a 10-day course of antibiotics. ?

## 2021-12-20 NOTE — Assessment & Plan Note (Addendum)
Patient is on aspirin and Lipitor as an outpatient. Continue home aspirin and Lipitor. ?

## 2021-12-20 NOTE — Assessment & Plan Note (Addendum)
Blood pressure currently well controlled.  Patient is on Entresto and diltiazem as an outpatient. Continue home Entresto and diltiazem. ?

## 2021-12-20 NOTE — ED Provider Triage Note (Addendum)
Emergency Medicine Provider Triage Evaluation Note ? ?Bobby Forster. , a 71 y.o. male  was evaluated in triage.  Pt complains of 2 days of constipation and nausea, and 1 day of abdominal pain.  Abdominal pain of RLQ with radiation to RUQ and suprapubic region.  No flank pain bilaterally or urinary symptoms.  Denies fever, recent illness, chills.  Denies vomiting or diarrhea.  Endorses shortness of breath, but denies chest pain.  No Hx of abdominal surgeries.  Prior Hx of renal calculi about 8 years ago.  No Hx of known pulmonary disease.  Denies active nausea. ? ?Review of Systems  ?Positive: As above ?Negative: As above ? ?Physical Exam  ?BP (!) 137/97 (BP Location: Left Arm)   Pulse 95   Temp 98.4 ?F (36.9 ?C) (Oral)   Resp 18   SpO2 99%  ?Gen:   Awake, no distress  ?Resp:  Shallow breaths, tachypneic, CTAB ?MSK:   Moves extremities without difficulty  ?Other:  Significant RLQ and suprapubic tenderness; pulses 2+ of DP and radial bilat; afebrile; negative CVA tenderness bilat; RRR w/o M/R/G ? ?Medical Decision Making  ?Medically screening exam initiated at 12:05 PM.  Appropriate orders placed.  Bobby Forster. was informed that the remainder of the evaluation will be completed by another provider, this initial triage assessment does not replace that evaluation, and the importance of remaining in the ED until their evaluation is complete. ? ?Labs, imaging ordered ? ?Offered Zofran, patient declined that he is not actively nauseous. ?  ?Prince Rome, PA-C ?27/06/23 1219 ? ?  ?Prince Rome, PA-C ?76/28/31 1253 ? ?

## 2021-12-20 NOTE — Assessment & Plan Note (Addendum)
Supplementation given. Resolved. ?

## 2021-12-20 NOTE — Assessment & Plan Note (Addendum)
-  Continue home Lipitor 

## 2021-12-20 NOTE — ED Provider Notes (Signed)
?[facility] DEPT ?Provider Note ? ?CSN: [id number] ?Arrival date & time: 12/20/21 1131 ? ?Chief Complaint(s) ?Abdominal Pain ? ?HPI ?Bobby [name]. is a 71 y.o. male with PMH CHF, HLD, HTN, prostate cancer who presents emergency department for evaluation of abdominal pain.  Patient states that pain has been getting progressively worse over the last 2 days and is also noticed worsening abdominal distention.  He endorses vomiting and nausea and states he has not had a bowel movement in 2 days.  Denies chest pain, headache, fever or other systemic symptoms. ? ? ?Abdominal Pain ?Associated symptoms: nausea and vomiting   ? ?Past Medical History ?Past Medical History:  ?Diagnosis Date  ? Bronchitis   ? CHF (congestive heart failure) ([place]) 04/2020  ? A/C HFpEF  ? Coronary artery calcification of native artery   ? Family history of pancreatic cancer   ? History of kidney stones   ? Hyperlipidemia   ? Hypertension   ? Pneumonia   ? Prostate cancer ([place])   ? ?Patient Active Problem List  ? Diagnosis Date Noted  ? Acute on chronic diastolic CHF (congestive heart failure) ([place]) 05/06/2020  ? Mixed hyperlipidemia 05/06/2020  ? Essential hypertension 05/06/2020  ? Atrial fibrillation ([place]) 05/06/2020  ? Hypokalemia 05/06/2020  ? Prediabetes 05/06/2020  ? Leukocytosis 05/06/2020  ? Coronary artery disease involving native coronary artery of native heart 05/06/2020  ? Atypical chest pain 05/06/2020  ? Genetic testing 09/10/2018  ? Family history of breast cancer   ? Family history of pancreatic cancer   ? Family history of kidney cancer   ? Malignant neoplasm of prostate ([place]) 05/22/2017  ? OM (otitis media), acute 09/08/2012  ? Sinusitis 09/08/2012  ? Conjunctivitis 09/08/2012  ? GASTROENTERITIS 11/10/2009  ? DIVERTICULOSIS, COLON 11/10/2009  ? NEPHROLITHIASIS, HX OF 11/10/2009  ? PYELONEPHRITIS NOS 05/02/2007  ? ABDOMINAL PAIN, LEFT LOWER QUADRANT 04/28/2007  ? ?Home Medication(s) ?Prior to  Admission medications   ?Medication Sig Start Date End Date Taking? Authorizing Provider  ?abiraterone acetate (ZYTIGA) 250 MG tablet Take 1,000 mg by mouth daily. Take on an empty stomach 1 hour before or 2 hours after a meal    [provider]  ?abiraterone acetate (ZYTIGA) 250 MG tablet Take 4 tablets (1,000 mg total) by mouth daily. Take on an empty stomach 1 hour before or 2 hours after a meal 12/13/21   Wyatt Portela, MD  ?arformoterol (BROVANA) 15 MCG/2ML NEBU Take 2 mLs (15 mcg total) by nebulization 2 (two) times daily. 11/29/20   Freddi Starr, MD  ?aspirin EC 81 MG tablet Take 1 tablet (81 mg total) by mouth daily. 01/21/20   Tolia, Sunit, DO  ?atorvastatin (LIPITOR) 40 MG tablet TAKE ONE TABLET BY MOUTH (40 MG total) DAILY 10/23/21   Tolia, Sunit, DO  ?budesonide (PULMICORT) 0.25 MG/2ML nebulizer solution Take 2 mLs (0.25 mg total) by nebulization in the morning and at bedtime. 05/30/20   Freddi Starr, MD  ?diltiazem (CARDIZEM CD) 360 MG 24 hr capsule TAKE ONE CAPSULE BY MOUTH ONCE DAILY 10/23/21   Terri Skains, Sunit, DO  ?empagliflozin (JARDIANCE) 10 MG TABS tablet Take 1 tablet (10 mg total) by mouth daily before breakfast. 08/06/21   Tolia, Sunit, DO  ?lidocaine (XYLOCAINE) 2 % solution Use as directed 15 mLs in the mouth or throat as needed for mouth pain. 05/25/21   Domenic Moras, PA-C  ?methocarbamol (ROBAXIN) 500 MG tablet Take 1 tablet by mouth daily as needed for  muscle spasms.     [provider]  ?Multiple Vitamins-Minerals (MULTIVITAMIN WITH MINERALS) tablet Take 1 tablet by mouth daily.    [provider]  ?nystatin (MYCOSTATIN) 100000 UNIT/ML suspension Take 5 mLs (500,000 Units total) by mouth 4 (four) times daily. 05/25/21   Domenic Moras, PA-C  ?predniSONE (DELTASONE) 10 MG tablet Take 10 mg by mouth daily with breakfast.    [provider]  ?sacubitril-valsartan (ENTRESTO) 97-103 MG Take 1 tablet by mouth 2 (two) times daily. 10/22/21 01/20/22  Tolia, Sunit,  DO  ?solifenacin (VESICARE) 5 MG tablet Take 5 mg by mouth daily. 04/03/20   [provider]  ?tamsulosin (FLOMAX) 0.4 MG CAPS capsule Take 1 capsule (0.4 mg total) by mouth daily after supper. ?Patient taking differently: Take 0.4 mg by mouth at bedtime. 04/17/17   Kathie Rhodes, MD  ?traMADol (ULTRAM) 50 MG tablet Take 50 mg by mouth 2 (two) times daily as needed for moderate pain.  03/29/20   [provider]  ?                                                                                                                                  ?Past Surgical History ?Past Surgical History:  ?Procedure Laterality Date  ? EXTRACORPOREAL SHOCK WAVE LITHOTRIPSY Right 04/17/2017  ? Procedure: RIGHT EXTRACORPOREAL SHOCK WAVE LITHOTRIPSY (ESWL);  Surgeon: Kathie Rhodes, MD;  Location: [facility];  Service: Urology;  Laterality: Right;  ? HERNIA REPAIR    ? age 12  ? LEFT HEART CATH AND CORONARY ANGIOGRAPHY N/A 05/08/2020  ? Procedure: LEFT HEART CATH AND CORONARY ANGIOGRAPHY;  Surgeon: Adrian Prows, MD;  Location: [facility];  Service: Cardiovascular;  Laterality: N/A;  ? PROSTATE BIOPSY    ? ?Family History ?Family History  ?Problem Relation Age of Onset  ? Breast cancer Sister 14  ?     "negative genetic testing"  ? Pancreatic cancer Brother 53  ? Breast cancer Maternal Aunt   ? Kidney cancer Maternal Aunt   ?     dx 8s  ? ? ?Social History ?Social History  ? ?Tobacco Use  ? Smoking status: Former  ?  Packs/day: 0.25  ?  Years: 10.00  ?  Pack years: 2.50  ?  Types: Cigarettes  ?  Quit date: 01/20/2012  ?  Years since quitting: 9.9  ? Smokeless tobacco: Never  ?Vaping Use  ? Vaping Use: Never used  ?Substance Use Topics  ? Alcohol use: No  ? Drug use: No  ? ?Allergies ?Patient has no known allergies. ? ?Review of Systems ?Review of Systems  ?Gastrointestinal:  Positive for abdominal pain, nausea and vomiting.  ? ?Physical Exam ?Vital Signs  ?I have reviewed the triage vital signs ?BP (!) 144/78   Pulse 95    Temp 98.4 ?F (36.9 ?C) (Oral)   Resp (!) 26   SpO2 99%  ? ?Physical Exam ?Vitals and  nursing note reviewed.  ?Constitutional:   ?   General: He is not in acute distress. ?   Appearance: He is well-developed. He is ill-appearing.  ?HENT:  ?   Head: Normocephalic and atraumatic.  ?Eyes:  ?   Conjunctiva/sclera: Conjunctivae normal.  ?Cardiovascular:  ?   Rate and Rhythm: Normal rate and regular rhythm.  ?   Heart sounds: No murmur heard. ?Pulmonary:  ?   Effort: No respiratory distress.  ?   Breath sounds: Normal breath sounds.  ?   Comments: Tachypnea ?Abdominal:  ?   Palpations: Abdomen is soft.  ?   Tenderness: There is generalized abdominal tenderness. There is guarding.  ?Musculoskeletal:     ?   General: No swelling.  ?   Cervical back: Neck supple.  ?Skin: ?   General: Skin is warm and dry.  ?   Capillary Refill: Capillary refill takes less than 2 seconds.  ?Neurological:  ?   Mental Status: He is alert.  ?Psychiatric:     ?   Mood and Affect: Mood normal.  ? ? ?ED Results and Treatments ?Labs ?(all labs ordered are listed, but only abnormal results are displayed) ?Labs Reviewed  ?COMPREHENSIVE METABOLIC PANEL - Abnormal; Notable for the following components:  ?    Result Value  ? Potassium 3.2 (*)   ? Glucose, Bld 105 (*)   ? AST 14 (*)   ? All other components within normal limits  ?CBC - Abnormal; Notable for the following components:  ? WBC 17.5 (*)   ? MCV 75.5 (*)   ? MCH 24.0 (*)   ? RDW 18.7 (*)   ? All other components within normal limits  ?LIPASE, BLOOD  ?URINALYSIS, ROUTINE W REFLEX MICROSCOPIC  ?                                                                                                                       ? ?Radiology ?DG Chest 2 View ? ?Result Date: 12/20/2021 ?CLINICAL DATA:  Shortness of breath EXAM: CHEST - 2 VIEW COMPARISON:  05/18/2020 FINDINGS: Stable cardiomegaly with vascular congestion and basilar atelectasis versus scarring. No definite pneumonia, CHF pattern, large effusion or  pneumothorax. Trachea midline. Aorta atherosclerotic. IMPRESSION: Cardiomegaly with vascular congestion and basilar atelectasis. No interval change. Aortic Atherosclerosis (ICD10-I70.0). Electronically Signed

## 2021-12-20 NOTE — Assessment & Plan Note (Addendum)
Continue CPAP nightly. °

## 2021-12-20 NOTE — Assessment & Plan Note (Signed)
Seems likely related to IV fluid resuscitation initially provided to the patient.  Symptoms significantly improved after IV Lasix. ?

## 2021-12-20 NOTE — Progress Notes (Signed)
Pharmacy Antibiotic Note ? ?Bobby [name] [name]. is a 71 y.o. male admitted on 12/20/2021 with Intra-abdominal infection, diverticulitis.  Pharmacy has been consulted for Zosyn dosing. ? ?Plan: ?Zosyn 3.375g IV Q8H infused over 4hrs.  ?Follow up renal function, culture results, and clinical course. ? ? ?Weight: 106.7 kg (235 lb 5 oz) ? ?Temp (24hrs), Avg:98.4 ?F (36.9 ?C), Min:98.4 ?F (36.9 ?C), Max:98.4 ?F (36.9 ?C) ? ?Recent Labs  ?Lab 12/20/21 ?1228 12/20/21 ?1616  ?WBC 17.5*  --   ?CREATININE 0.76  --   ?LATICACIDVEN  --  1.5  ?  ?Estimated Creatinine Clearance: 101.4 mL/min (by C-G formula based on SCr of 0.76 mg/dL).   ? ?No Known Allergies ? ?Antimicrobials this admission: ?4/13 Zosyn >>  ? ?Dose adjustments this admission: ? ? ?Microbiology results: ?4/13 BCx:  ? ?Thank you for allowing pharmacy to be a part of this patientBobbys care. ? ?Gretta Arab PharmD, BCPS ?Clinical Pharmacist ?[facility] [phone] ?12/20/2021 5:46 PM ? ? ?

## 2021-12-20 NOTE — Assessment & Plan Note (Signed)
Body mass index is 36.86 kg/m?Bobby [name] ?

## 2021-12-20 NOTE — Assessment & Plan Note (Addendum)
Patient follows with Dr. Terri Skains as an outpatient. LVEF of 60-65% on most recent Transthoracic Echocardiogram with grade 2 diastolic dysfunction.  Patient is on Entresto as an outpatient.  Stable. Continue outpatient regimen. ?

## 2021-12-21 DIAGNOSIS — K5792 Diverticulitis of intestine, part unspecified, without perforation or abscess without bleeding: Secondary | ICD-10-CM | POA: Diagnosis not present

## 2021-12-21 LAB — BASIC METABOLIC PANEL
Anion gap: 9 (ref 5–15)
BUN: 18 mg/dL (ref 8–23)
CO2: 20 mmol/L — ABNORMAL LOW (ref 22–32)
Calcium: 8.6 mg/dL — ABNORMAL LOW (ref 8.9–10.3)
Chloride: 106 mmol/L (ref 98–111)
Creatinine, Ser: 1.1 mg/dL (ref 0.61–1.24)
GFR, Estimated: >60 mL/min (ref >60)
Glucose, Bld: 99 mg/dL (ref 70–99)
Potassium: 3.7 mmol/L (ref 3.5–5.1)
Sodium: 135 mmol/L (ref 135–145)

## 2021-12-21 LAB — CBC
HCT: 36.4 % — ABNORMAL LOW (ref 39.0–52.0)
Hemoglobin: 12 g/dL — ABNORMAL LOW (ref 13.0–17.0)
MCH: 24.5 pg — ABNORMAL LOW (ref 26.0–34.0)
MCHC: 33 g/dL (ref 30.0–36.0)
MCV: 74.3 fL — ABNORMAL LOW (ref 80.0–100.0)
Platelets: 211 10*3/uL (ref 150–400)
RBC: 4.9 MIL/uL (ref 4.22–5.81)
RDW: 18.7 % — ABNORMAL HIGH (ref 11.5–15.5)
WBC: 18.8 10*3/uL — ABNORMAL HIGH (ref 4.0–10.5)
nRBC: 0 % (ref 0.0–0.2)

## 2021-12-21 LAB — HIV ANTIBODY (ROUTINE TESTING W REFLEX): HIV Screen 4th Generation wRfx: NONREACTIVE

## 2021-12-21 MED ORDER — ALUM & MAG HYDROXIDE-SIMETH 200-200-20 MG/5ML PO SUSP
30.0000 mL | ORAL | Status: DC | PRN
  Administered 2021-12-21 (×3): 30 mL via ORAL
  Filled 2021-12-21 (×4): qty 30

## 2021-12-21 MED ORDER — POLYETHYLENE GLYCOL 3350 17 G PO PACK
17.0000 g | PACK | Freq: Two times a day (BID) | ORAL | Status: DC
  Administered 2021-12-21 – 2021-12-23 (×3): 17 g via ORAL
  Filled 2021-12-21 (×7): qty 1

## 2021-12-21 NOTE — Progress Notes (Signed)
Pharmacy Antibiotic Note ? ?Bobby [name]. is a 71 y.o. male admitted on 12/20/2021 with Intra-abdominal infection, diverticulitis.  Pharmacy has been consulted for Zosyn dosing. ? ?Plan: ?Continue Zosyn 3.375g IV Q8H infused over 4hrs.  ?No dose adjustments needed, Pharmacy will sign off note writing ? ? ?Height: 5' 6.5" (168.9 cm) ?Weight: 106.7 kg (235 lb 5 oz) ?IBW/kg (Calculated) : 64.95 ? ?Temp (24hrs), Avg:99.1 ?F (37.3 ?C), Min:98.4 ?F (36.9 ?C), Max:100 ?F (37.8 ?C) ? ?Recent Labs  ?Lab 12/20/21 ?1228 12/20/21 ?1616 12/21/21 ?2878  ?WBC 17.5*  --  18.8*  ?CREATININE 0.76  --  1.10  ?LATICACIDVEN  --  1.5  --   ? ?  ?Estimated Creatinine Clearance: 73.2 mL/min (by C-G formula based on SCr of 1.1 mg/dL).   ? ?No Known Allergies ? ?Antimicrobials this admission: ?4/13 Zosyn >>  ? ?Dose adjustments this admission: none ? ?Microbiology results: ?4/13 BCx: ngtd ? ?Thank you for allowing pharmacy to be a part of this patient?s care. ? ?Peggyann Juba, PharmD, BCPS ?[facility] [phone] ?12/21/2021 9:40 AM ? ? ?

## 2021-12-21 NOTE — Hospital Course (Addendum)
Bobby [name]. is a 71 y.o. male with medical history significant of prostate cancer, heart failure, hypertension, hyperlipidemia. Patient presented secondary to abdominal pain and was found to have sigmoid diverticulitis. Empiric antibiotics initiated with improvement of symptoms.  Hospitalization complicated by ileus which has improved prior to discharge. ?

## 2021-12-21 NOTE — Progress Notes (Addendum)
PROGRESS NOTE    Bobby [name].  QMV:[id number] DOB: Jan 27, 1951 DOA: 12/20/2021 PCP: Adrian Prince, MD   Brief Narrative: Bobby Ley Tallon Finkle. is a 71 y.o. male with medical history significant of prostate cancer, heart failure, hypertension, hyperlipidemia. Patient presented secondary to abdominal pain and was found to have sigmoid diverticulitis. Empiric antibiotics initiated.   Assessment and Plan: * Acute diverticulitis Patient with associated leukocytosis.  Afebrile.  CT abdomen/pelvis significant for acute sigmoid diverticulitis and midline of lower abdomen/upper pelvis without evidence of abscess.  Zosyn initiated in the emergency department. -Follow up blood cultures -Continue IV Zosyn -Clear liquid diet and advance as tolerated to a soft diet -Analgesic support -Hold IV fluids for now secondary to prior tachypnea and requirement for Lasix IV  Chronic heart failure with preserved ejection fraction [facility]) Patient follows with Dr. Odis Hollingshead as an outpatient. LVEF of 60-65% on most recent Transthoracic Echocardiogram with grade 2 diastolic dysfunction.  Patient is on Entresto as an outpatient.  Stable.  Coronary artery disease involving native coronary artery of native heart Patient is on aspirin and Lipitor as an outpatient. -Continue home aspirin and Lipitor  Essential hypertension Blood pressure currently well controlled.  Patient is on Entresto and diltiazem as an outpatient. -Continue home Entresto and diltiazem  Tachypnea-resolved as of 12/20/2021 Seems likely related to IV fluid resuscitation initially provided to the patient.  Symptoms significantly improved after IV Lasix.  Hypokalemia-resolved as of 12/21/2021 Supplementation given. Resolved.  Obesity (BMI 30-39.9) Body mass index is 36.86 kg/m.  Obstructive sleep apnea -Continue CPAP nightly  Mixed hyperlipidemia -Continue home Lipitor  Malignant neoplasm of prostate [facility]) Patient follows with  Dr. Clelia Croft as an outpatient.  He is currently on Eligard every 4 months in addition to [place] daily with prednisone -Continue Zytiga and prednisone    DVT prophylaxis: Lovenox Code Status:   Code Status: Full Code Family Communication: Wife at bedside Disposition Plan: Discharge home likely in 2 days pending improvement of symptoms, advancement of diet, transition to antibiotics   Consultants:  None  Procedures:  None  Antimicrobials: Zosyn    Subjective: Abdominal pain has migrated more lower mid-abdomen. Had some vomiting this morning. No bowel movement yet.   Objective: BP (!) 105/52 (BP Location: Right Arm)   Pulse 98   Temp 99.8 F (37.7 C) (Oral)   Resp 18   Ht 5' 6.5" (1.689 m)   Wt 106.7 kg   SpO2 96%   BMI 37.41 kg/m   Examination:  General exam: Appears calm and comfortable Respiratory system: Clear to auscultation. Respiratory effort normal. Cardiovascular system: S1 & S2 heard, RRR. No murmurs, rubs, gallops or clicks. Gastrointestinal system: Abdomen is distended, soft and tender in lower abdomen. Normal bowel sounds heard. Central nervous system: Alert and oriented. No focal neurological deficits. Musculoskeletal: No calf tenderness Skin: No cyanosis. No rashes Psychiatry: Judgement and insight appear normal. Mood & affect appropriate.    Data Reviewed: I have personally reviewed following labs and imaging studies  CBC Lab Results  Component Value Date   WBC 18.8 (H) 12/21/2021   RBC 4.90 12/21/2021   HGB 12.0 (L) 12/21/2021   HCT 36.4 (L) 12/21/2021   MCV 74.3 (L) 12/21/2021   MCH 24.5 (L) 12/21/2021   PLT 211 12/21/2021   MCHC 33.0 12/21/2021   RDW 18.7 (H) 12/21/2021   LYMPHSABS 1.1 11/07/2021   MONOABS 0.6 11/07/2021   EOSABS 0.2 11/07/2021   BASOSABS 0.0 11/07/2021     Last  metabolic panel Lab Results  Component Value Date   NA 135 12/21/2021   K 3.7 12/21/2021   CL 106 12/21/2021   CO2 20 (L) 12/21/2021   BUN 18  12/21/2021   CREATININE 1.10 12/21/2021   GLUCOSE 99 12/21/2021   GFRNONAA >60 12/21/2021   GFRAA 81 09/29/2020   CALCIUM 8.6 (L) 12/21/2021   PROT 7.3 12/20/2021   ALBUMIN 4.0 12/20/2021   BILITOT 1.2 12/20/2021   ALKPHOS 86 12/20/2021   AST 14 (L) 12/20/2021   ALT 11 12/20/2021   ANIONGAP 9 12/21/2021    GFR: Estimated Creatinine Clearance: 73.2 mL/min (by C-G formula based on SCr of 1.1 mg/dL).  Recent Results (from the past 240 hour(s))  Culture, blood (Routine X 2) w Reflex to ID Panel     Status: None (Preliminary result)   Collection Time: 12/20/21  4:39 PM   Specimen: BLOOD  Result Value Ref Range Status   Specimen Description   Final    BLOOD LEFT ANTECUBITAL Performed at [facility], [address], [place], [place] [address]    Special Requests   Final    BOTTLES DRAWN AEROBIC AND ANAEROBIC Blood Culture adequate volume Performed at [facility], [address] [place], [place] [address]    Culture   Final    NO GROWTH < 24 HOURS Performed at [facility], [address], [place], [place] [address]    Report Status PENDING  Incomplete  Culture, blood (Routine X 2) w Reflex to ID Panel     Status: None (Preliminary result)   Collection Time: 12/20/21  4:54 PM   Specimen: BLOOD  Result Value Ref Range Status   Specimen Description   Final    BLOOD RIGHT ANTECUBITAL Performed at [facility], [address] [place], [place] [address]    Special Requests   Final    BOTTLES DRAWN AEROBIC AND ANAEROBIC Blood Culture adequate volume Performed at [facility], [address] [place], [place] [address]    Culture   Final    NO GROWTH < 24 HOURS Performed at [facility], [address] [place], [place] [address]    Report Status PENDING  Incomplete      Radiology Studies: DG Chest 2 View  Result Date: 12/20/2021 CLINICAL DATA:  Shortness of breath EXAM: CHEST - 2  VIEW COMPARISON:  05/18/2020 FINDINGS: Stable cardiomegaly with vascular congestion and basilar atelectasis versus scarring. No definite pneumonia, CHF pattern, large effusion or pneumothorax. Trachea midline. Aorta atherosclerotic. IMPRESSION: Cardiomegaly with vascular congestion and basilar atelectasis. No interval change. Aortic Atherosclerosis (ICD10-I70.0). Electronically Signed   By: Judie Petit.  Shick M.D.   On: 12/20/2021 12:39   CT Abdomen Pelvis W Contrast  Result Date: 12/20/2021 CLINICAL DATA:  Nausea, vomiting, lower abdominal and pelvic pain EXAM: CT ABDOMEN AND PELVIS WITH CONTRAST TECHNIQUE: Multidetector CT imaging of the abdomen and pelvis was performed using the standard protocol following bolus administration of intravenous contrast. RADIATION DOSE REDUCTION: This exam was performed according to the departmental dose-optimization program which includes automated exposure control, adjustment of the mA and/or kV according to patient size and/or use of iterative reconstruction technique. CONTRAST:  OMNIPAQUE IOHEXOL 300 MG/ML  SOLN COMPARISON:  05/21/2017 FINDINGS: Lower chest: Bibasilar subpleural atelectasis versus scarring. Normal heart size. Native coronary atherosclerosis. No pericardial or pleural effusion. Hepatobiliary: Anterior right hepatic dome punctate calcified granulomata noted. Scattered hepatic hypodense cysts in the left lobe,  largest measures 19 mm, image 15/2. No biliary dilatation or obstruction pattern. Mild distension of the gallbladder but no surrounding inflammation or free fluid. Common bile duct nondilated. Pancreas: Unremarkable. No pancreatic ductal dilatation or surrounding inflammatory changes. Spleen: Normal in size without focal abnormality. Adrenals/Urinary Tract: Normal adrenal glands. No renal obstruction or hydronephrosis. Left kidney cortical hypodense cysts noted, largest in the lower pole measures 2 cm. No further imaging follow-up recommended. No hydroureter  or ureteral calculus.  Bladder unremarkable. Stomach/Bowel: Negative for bowel obstruction, significant dilatation, ileus, or free air. Normal retrocecal appendix. In the lower abdomen and upper pelvis, there is pericolonic and anterior mesenteric strandy edema/inflammation and sigmoid diverticular disease. Findings compatible with acute sigmoid diverticulitis. No fluid collection or abscess. No associated obstruction. Vascular/Lymphatic: Aorta atherosclerotic. Negative for aneurysm. No dissection or occlusive process. Mesenteric and renal vasculature appear patent. No veno-occlusive finding or adenopathy. Reproductive: No significant finding by CT Other: No abdominal wall hernia or abnormality. No abdominopelvic ascites. Musculoskeletal: Degenerative changes of the lumbosacral spine. L5 bilateral pars defects with grade 1 anterolisthesis of L5 upon S1 measuring 7 mm. No acute compression fracture. IMPRESSION: Acute sigmoid diverticulitis in the midline of the lower abdomen and upper pelvis. No associated obstruction, fluid collection, or abscess. 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 Electronically Signed   By: Judie Petit.  Shick M.D.   On: 12/20/2021 15:48      LOS: 1 day    Jacquelin Hawking, MD [facility] 12/21/2021, 4:35 PM   If 7PM-7AM, please contact night-coverage [email or web address]

## 2021-12-21 NOTE — Plan of Care (Signed)
  Problem: Health Behavior/Discharge Planning: Goal: Ability to manage health-related needs will improve Outcome: Progressing   Problem: Clinical Measurements: Goal: Ability to maintain clinical measurements within normal limits will improve Outcome: Progressing   Problem: Safety: Goal: Ability to remain free from injury will improve Outcome: Progressing   

## 2021-12-21 NOTE — Progress Notes (Signed)
?  Transition of Care (TOC) Screening Note ? ? ?Patient Details  ?Name: Bobby [name]. ?Date of Birth: 1951/07/09 ? ? ?Transition of Care (TOC) CM/SW Contact:    ?Raffaella Edison, LCSW ?Phone Number: ?12/21/2021, 9:56 AM ? ? ? ?Transition of Care Department [facility]) has reviewed patient and no TOC needs have been identified at this time. We will continue to monitor patient advancement through interdisciplinary progression rounds. If new patient transition needs arise, please place a TOC consult. ? ? ?

## 2021-12-22 ENCOUNTER — Inpatient Hospital Stay (HOSPITAL_COMMUNITY): Payer: Medicare PPO

## 2021-12-22 DIAGNOSIS — K5792 Diverticulitis of intestine, part unspecified, without perforation or abscess without bleeding: Secondary | ICD-10-CM | POA: Diagnosis not present

## 2021-12-22 LAB — URINALYSIS, COMPLETE (UACMP) WITH MICROSCOPIC
Bacteria, UA: NONE SEEN
Bilirubin Urine: NEGATIVE
Glucose, UA: >=500 mg/dL — AB
Ketones, ur: 80 mg/dL — AB
Leukocytes,Ua: NEGATIVE
Nitrite: NEGATIVE
Protein, ur: 100 mg/dL — AB
Specific Gravity, Urine: 1.031 — ABNORMAL HIGH (ref 1.005–1.030)
pH: 5 (ref 5.0–8.0)

## 2021-12-22 LAB — CBC
HCT: 37.5 % — ABNORMAL LOW (ref 39.0–52.0)
Hemoglobin: 11.7 g/dL — ABNORMAL LOW (ref 13.0–17.0)
MCH: 23.5 pg — ABNORMAL LOW (ref 26.0–34.0)
MCHC: 31.2 g/dL (ref 30.0–36.0)
MCV: 75.5 fL — ABNORMAL LOW (ref 80.0–100.0)
Platelets: 246 10*3/uL (ref 150–400)
RBC: 4.97 MIL/uL (ref 4.22–5.81)
RDW: 18.6 % — ABNORMAL HIGH (ref 11.5–15.5)
WBC: 13.4 10*3/uL — ABNORMAL HIGH (ref 4.0–10.5)
nRBC: 0 % (ref 0.0–0.2)

## 2021-12-22 MED ORDER — PREDNISONE 5 MG PO TABS: 5.0000 mg | ORAL_TABLET | Freq: Every day | ORAL | Status: DC

## 2021-12-22 MED ORDER — SODIUM CHLORIDE 0.9 % IV BOLUS
500.0000 mL | Freq: Once | INTRAVENOUS | Status: AC
  Administered 2021-12-22: 500 mL via INTRAVENOUS

## 2021-12-22 MED ORDER — PREDNISONE 5 MG PO TABS
5.0000 mg | ORAL_TABLET | Freq: Every day | ORAL | Status: DC
Start: 2021-12-22 — End: 2021-12-24
  Administered 2021-12-22 – 2021-12-24 (×3): 5 mg via ORAL
  Filled 2021-12-22 (×3): qty 1

## 2021-12-22 NOTE — Plan of Care (Signed)
  Problem: Health Behavior/Discharge Planning: Goal: Ability to manage health-related needs will improve Outcome: Progressing   Problem: Pain Managment: Goal: General experience of comfort will improve Outcome: Progressing   

## 2021-12-22 NOTE — Progress Notes (Signed)
? ?PROGRESS NOTE ? ? ? ?Bobby [name].  QPR:[id number] DOB: 01-19-1951 DOA: 12/20/2021 ?PCP: [name], MD ? ? ?Brief Narrative: ?Bobby [name]. is a 70 y.o. male with medical history significant of prostate cancer, heart failure, hypertension, hyperlipidemia. Patient presented secondary to abdominal pain and was found to have sigmoid diverticulitis. Empiric antibiotics initiated. ? ? ?Assessment and Plan: ?* Acute diverticulitis ?Patient with associated leukocytosis.  Afebrile.  CT abdomen/pelvis significant for acute sigmoid diverticulitis and midline of lower abdomen/upper pelvis without evidence of abscess.  Zosyn initiated in the emergency department. WBC has started to improve. ?-Follow up blood cultures ?-Continue IV Zosyn ?-Advance to soft diet ?-Analgesic support ?-Abdominal x-ray for continued distension and no bowel movement ? ?Chronic heart failure with preserved ejection fraction ([place]) ?Patient follows with Dr. Terri Skains as an outpatient. LVEF of 60-65% on most recent Transthoracic Echocardiogram with grade 2 diastolic dysfunction.  Patient is on Entresto as an outpatient.  Stable. ? ?Coronary artery disease involving native coronary artery of native heart ?Patient is on aspirin and Lipitor as an outpatient. ?-Continue home aspirin and Lipitor ? ?Essential hypertension ?Blood pressure currently well controlled.  Patient is on Entresto and diltiazem as an outpatient. ?-Continue home Entresto and diltiazem ? ?Tachypnea-resolved as of 12/20/2021 ?Seems likely related to IV fluid resuscitation initially provided to the patient.  Symptoms significantly improved after IV Lasix. ? ?Hypokalemia-resolved as of 12/21/2021 ?Supplementation given. Resolved. ? ?Obesity (BMI 30-39.9) ?Body mass index is 36.86 kg/m?. ? ?Obstructive sleep apnea ?-Continue CPAP nightly ? ?Mixed hyperlipidemia ?-Continue home Lipitor ? ?Malignant neoplasm of prostate ([place]) ?Patient follows with Dr. Alen Blew as an outpatient.   He is currently on Eligard every 4 months in addition to [place] daily with prednisone ?-Continue Zytiga and prednisone (confirmed patient takes prednisone 5 mg daily) ? ? ? ?DVT prophylaxis: Lovenox ?Code Status:   Code Status: Full Code ?Family Communication: Wife at bedside ?Disposition Plan: Discharge home likely in 1 day pending improvement of symptoms, transition to antibiotics, abdominal x-ray findings ? ? ?Consultants:  ?None ? ?Procedures:  ?None ? ?Antimicrobials: ?Zosyn  ? ? ?Subjective: ?Abdominal pain is improved. He has not eaten much. No emesis this morning. No bowel movement. ? ?Objective: ?BP 102/65   Pulse 93   Temp 98.6 ?F (37 ?C)   Resp 19   Ht 5' 6.5" (1.689 m)   Wt 106.7 kg   SpO2 94%   BMI 37.41 kg/m?  ? ?Examination: ? ?General exam: Appears calm and comfortable ?Respiratory system: Clear to auscultation. Respiratory effort normal. ?Cardiovascular system: S1 & S2 heard, RRR. ?Gastrointestinal system: Abdomen is distended, soft and nontender. Normal bowel sounds heard. ?Central nervous system: Alert and oriented. No focal neurological deficits. ?Musculoskeletal: No calf tenderness ?Skin: No cyanosis. No rashes ?Psychiatry: Judgement and insight appear normal. Mood & affect appropriate.  ? ? ?Data Reviewed: I have personally reviewed following labs and imaging studies ? ?CBC ?Lab Results  ?Component Value Date  ? WBC 13.4 (H) 12/22/2021  ? RBC 4.97 12/22/2021  ? HGB 11.7 (L) 12/22/2021  ? HCT 37.5 (L) 12/22/2021  ? MCV 75.5 (L) 12/22/2021  ? MCH 23.5 (L) 12/22/2021  ? PLT 246 12/22/2021  ? MCHC 31.2 12/22/2021  ? RDW 18.6 (H) 12/22/2021  ? LYMPHSABS 1.1 11/07/2021  ? MONOABS 0.6 11/07/2021  ? EOSABS 0.2 11/07/2021  ? BASOSABS 0.0 11/07/2021  ? ? ? ?Last metabolic panel ?Lab Results  ?Component Value Date  ? NA 135 12/21/2021  ? K  3.7 12/21/2021  ? CL 106 12/21/2021  ? CO2 20 (L) 12/21/2021  ? BUN 18 12/21/2021  ? CREATININE 1.10 12/21/2021  ? GLUCOSE 99 12/21/2021  ? GFRNONAA >60  12/21/2021  ? GFRAA 81 09/29/2020  ? CALCIUM 8.6 (L) 12/21/2021  ? PROT 7.3 12/20/2021  ? ALBUMIN 4.0 12/20/2021  ? BILITOT 1.2 12/20/2021  ? ALKPHOS 86 12/20/2021  ? AST 14 (L) 12/20/2021  ? ALT 11 12/20/2021  ? ANIONGAP 9 12/21/2021  ? ? ?GFR: ?Estimated Creatinine Clearance: 73.2 mL/min (by C-G formula based on SCr of 1.1 mg/dL). ? ?Recent Results (from the past 240 hour(s))  ?Culture, blood (Routine X 2) w Reflex to ID Panel     Status: None (Preliminary result)  ? Collection Time: 12/20/21  4:39 PM  ? Specimen: BLOOD  ?Result Value Ref Range Status  ? Specimen Description   Final  ?  BLOOD LEFT ANTECUBITAL ?Performed at [facility], [place] [address], [place], [place] [address] ?  ? Special Requests   Final  ?  BOTTLES DRAWN AEROBIC AND ANAEROBIC Blood Culture adequate volume ?Performed at [facility], [place] [address], [place], [place] [address] ?  ? Culture   Final  ?  NO GROWTH 2 DAYS ?Performed at [facility], [place] [address] [place], [place] [address] ?  ? Report Status PENDING  Incomplete  ?Culture, blood (Routine X 2) w Reflex to ID Panel     Status: None (Preliminary result)  ? Collection Time: 12/20/21  4:54 PM  ? Specimen: BLOOD  ?Result Value Ref Range Status  ? Specimen Description   Final  ?  BLOOD RIGHT ANTECUBITAL ?Performed at [facility], [place] [address] [place], [place] [address] ?  ? Special Requests   Final  ?  BOTTLES DRAWN AEROBIC AND ANAEROBIC Blood Culture adequate volume ?Performed at [facility], [place] [address], [place], [place] [address] ?  ? Culture   Final  ?  NO GROWTH 2 DAYS ?Performed at [facility], [place] [address] [place], [place] [address] ?  ? Report Status PENDING  Incomplete  ?  ? ? ?Radiology Studies: ?CT Abdomen Pelvis W Contrast ? ?Result Date: 12/20/2021 ?CLINICAL DATA:  Nausea, vomiting, lower abdominal and pelvic pain EXAM: CT ABDOMEN AND PELVIS WITH CONTRAST TECHNIQUE:  Multidetector CT imaging of the abdomen and pelvis was performed using the standard protocol following bolus administration of intravenous contrast. RADIATION DOSE REDUCTION: This exam was performed according to the departmental dose-optimization program which includes automated exposure control, adjustment of the mA and/or kV according to patient size and/or use of iterative reconstruction technique. CONTRAST:  12m OMNIPAQUE IOHEXOL 300 MG/ML  SOLN COMPARISON:  05/21/2017 FINDINGS: Lower chest: Bibasilar subpleural atelectasis versus scarring. Normal heart size. Native coronary atherosclerosis. No pericardial or pleural effusion. Hepatobiliary: Anterior right hepatic dome punctate calcified granulomata noted. Scattered hepatic hypodense cysts in the left lobe, largest measures 19 mm, image 15/2. No biliary dilatation or obstruction pattern. Mild distension of the gallbladder but no surrounding inflammation or free fluid. Common bile duct nondilated. Pancreas: Unremarkable. No pancreatic ductal dilatation or surrounding inflammatory changes. Spleen: Normal in size without focal abnormality. Adrenals/Urinary Tract: Normal adrenal glands. No renal obstruction or hydronephrosis. Left kidney cortical hypodense cysts noted, largest in the lower pole measures 2 cm. No further imaging follow-up recommended. No hydroureter or ureteral calculus.  Bladder unremarkable. Stomach/Bowel: Negative for bowel obstruction, significant dilatation, ileus, or free air. Normal  retrocecal appendix. In the lower abdomen and upper pelvis, there is pericolonic and anterior mesenteric strandy edema/inflammation and sigmoid diverticular disease. Findings compatible with acute sigmoid diverticulitis. No fluid collection or abscess. No associated obstruction. Vascular/Lymphatic: Aorta atherosclerotic. Negative for aneurysm. No dissection or occlusive process. Mesenteric and renal vasculature appear patent. No veno-occlusive finding or  adenopathy. Reproductive: No significant finding by CT Other: No abdominal wall hernia or abnormality. No abdominopelvic ascites. Musculoskeletal: Degenerative changes of the lumbosacral spine. L5 bilateral pars defects with grade 1

## 2021-12-23 DIAGNOSIS — K567 Ileus, unspecified: Secondary | ICD-10-CM

## 2021-12-23 DIAGNOSIS — E876 Hypokalemia: Secondary | ICD-10-CM | POA: Diagnosis not present

## 2021-12-23 DIAGNOSIS — I5032 Chronic diastolic (congestive) heart failure: Secondary | ICD-10-CM | POA: Diagnosis not present

## 2021-12-23 DIAGNOSIS — I10 Essential (primary) hypertension: Secondary | ICD-10-CM | POA: Diagnosis not present

## 2021-12-23 DIAGNOSIS — K5792 Diverticulitis of intestine, part unspecified, without perforation or abscess without bleeding: Secondary | ICD-10-CM | POA: Diagnosis not present

## 2021-12-23 LAB — CBC
HCT: 38 % — ABNORMAL LOW (ref 39.0–52.0)
Hemoglobin: 11.9 g/dL — ABNORMAL LOW (ref 13.0–17.0)
MCH: 23.6 pg — ABNORMAL LOW (ref 26.0–34.0)
MCHC: 31.3 g/dL (ref 30.0–36.0)
MCV: 75.4 fL — ABNORMAL LOW (ref 80.0–100.0)
Platelets: 285 10*3/uL (ref 150–400)
RBC: 5.04 MIL/uL (ref 4.22–5.81)
RDW: 18.4 % — ABNORMAL HIGH (ref 11.5–15.5)
WBC: 8.2 10*3/uL (ref 4.0–10.5)
nRBC: 0 % (ref 0.0–0.2)

## 2021-12-23 MED ORDER — BISACODYL 10 MG RE SUPP
10.0000 mg | Freq: Once | RECTAL | Status: AC
  Administered 2021-12-23: 10 mg via RECTAL
  Filled 2021-12-23: qty 1

## 2021-12-23 NOTE — Assessment & Plan Note (Addendum)
Secondary to diverticulitis. Previously with nausea and vomiting which has resolved.  Patient with improved bowel function with bowel movements and has persistently been able to pass gas.  Abdominal distention improved prior to discharge.  Patient given return precautions on discharge. ?

## 2021-12-23 NOTE — Progress Notes (Signed)
? ?PROGRESS NOTE ? ? ? ?Bobby [name].  ZOX:[id number] DOB: April 23, 1951 DOA: 12/20/2021 ?PCP: [name], MD ? ? ?Brief Narrative: ?Bobby Knaak. is a 71 y.o. male with medical history significant of prostate cancer, heart failure, hypertension, hyperlipidemia. Patient presented secondary to abdominal pain and was found to have sigmoid diverticulitis. Empiric antibiotics initiated. ? ? ?Assessment and Plan: ?* Acute diverticulitis ?Patient with associated leukocytosis.  Afebrile.  CT abdomen/pelvis significant for acute sigmoid diverticulitis and midline of lower abdomen/upper pelvis without evidence of abscess.  Zosyn initiated in the emergency department. WBC has started to improve. Ileus seen on abdominal x-ray. ?-Follow up blood cultures ?-Continue IV Zosyn ?-Advance to soft diet ?-Analgesic support ? ?Chronic heart failure with preserved ejection fraction ([place]) ?Patient follows with Dr. Terri Skains as an outpatient. LVEF of 60-65% on most recent Transthoracic Echocardiogram with grade 2 diastolic dysfunction.  Patient is on Entresto as an outpatient.  Stable. ? ?Coronary artery disease involving native coronary artery of native heart ?Patient is on aspirin and Lipitor as an outpatient. ?-Continue home aspirin and Lipitor ? ?Essential hypertension ?Blood pressure currently well controlled.  Patient is on Entresto and diltiazem as an outpatient. ?-Continue home Entresto and diltiazem ? ?Tachypnea-resolved as of 12/20/2021 ?Seems likely related to IV fluid resuscitation initially provided to the patient.  Symptoms significantly improved after IV Lasix. ? ?Hypokalemia-resolved as of 12/21/2021 ?Supplementation given. Resolved. ? ?Obesity (BMI 30-39.9) ?Body mass index is 36.86 kg/m?. ? ?Obstructive sleep apnea ?-Continue CPAP nightly ? ?Mixed hyperlipidemia ?-Continue home Lipitor ? ?Malignant neoplasm of prostate ([place]) ?Patient follows with Dr. Alen Blew as an outpatient.  He is currently on Eligard every  4 months in addition to [place] daily with prednisone ?-Continue Zytiga and prednisone (confirmed patient takes prednisone 5 mg daily) ? ?Ileus ([place]) ?Secondary to diverticulitis. Previously with nausea and vomiting. ?-MiraLAX ?-Ambulate ? ? ? ?DVT prophylaxis: Lovenox ?Code Status:   Code Status: Full Code ?Family Communication: Wife at bedside ?Disposition Plan: Discharge home likely in 1 day pending improvement of symptoms, transition to antibiotics, improvement of ileus ? ? ?Consultants:  ?None ? ?Procedures:  ?None ? ?Antimicrobials: ?Zosyn  ? ? ?Subjective: ?Continued abdominal distension. Bowel movement described as watery. Passing some gas. Abdomen still very distended. ? ?Objective: ?BP 106/60 (BP Location: Right Arm)   Pulse 72   Temp 98.2 ?F (36.8 ?C)   Resp 16   Ht 5' 6.5" (1.689 m)   Wt 106.7 kg   SpO2 94%   BMI 37.41 kg/m?  ? ?Examination: ? ?General exam: Appears calm and comfortable ?Respiratory system: Clear to auscultation. Respiratory effort normal. ?Cardiovascular system: S1 & S2 heard, RRR. ?Gastrointestinal system: Abdomen is distended, soft and nontender. Improving bowel sounds heard. ?Central nervous system: Alert and oriented. No focal neurological deficits. ?Musculoskeletal: No calf tenderness ?Skin: No cyanosis. No rashes ?Psychiatry: Judgement and insight appear normal. Mood & affect appropriate.   ? ? ?Data Reviewed: I have personally reviewed following labs and imaging studies ? ?CBC ?Lab Results  ?Component Value Date  ? WBC 8.2 12/23/2021  ? RBC 5.04 12/23/2021  ? HGB 11.9 (L) 12/23/2021  ? HCT 38.0 (L) 12/23/2021  ? MCV 75.4 (L) 12/23/2021  ? MCH 23.6 (L) 12/23/2021  ? PLT 285 12/23/2021  ? MCHC 31.3 12/23/2021  ? RDW 18.4 (H) 12/23/2021  ? LYMPHSABS 1.1 11/07/2021  ? MONOABS 0.6 11/07/2021  ? EOSABS 0.2 11/07/2021  ? BASOSABS 0.0 11/07/2021  ? ? ? ?Last metabolic panel ?Lab Results  ?  Component Value Date  ? NA 135 12/21/2021  ? K 3.7 12/21/2021  ? CL 106 12/21/2021  ? CO2 20  (L) 12/21/2021  ? BUN 18 12/21/2021  ? CREATININE 1.10 12/21/2021  ? GLUCOSE 99 12/21/2021  ? GFRNONAA >60 12/21/2021  ? GFRAA 81 09/29/2020  ? CALCIUM 8.6 (L) 12/21/2021  ? PROT 7.3 12/20/2021  ? ALBUMIN 4.0 12/20/2021  ? BILITOT 1.2 12/20/2021  ? ALKPHOS 86 12/20/2021  ? AST 14 (L) 12/20/2021  ? ALT 11 12/20/2021  ? ANIONGAP 9 12/21/2021  ? ? ?GFR: ?Estimated Creatinine Clearance: 73.2 mL/min (by C-G formula based on SCr of 1.1 mg/dL). ? ?Recent Results (from the past 240 hour(s))  ?Culture, blood (Routine X 2) w Reflex to ID Panel     Status: None (Preliminary result)  ? Collection Time: 12/20/21  4:39 PM  ? Specimen: BLOOD  ?Result Value Ref Range Status  ? Specimen Description   Final  ?  BLOOD LEFT ANTECUBITAL ?Performed at [facility], [place] [address] [place], [place] [address] ?  ? Special Requests   Final  ?  BOTTLES DRAWN AEROBIC AND ANAEROBIC Blood Culture adequate volume ?Performed at [facility], [place] [address], [place], [place] [address] ?  ? Culture   Final  ?  NO GROWTH 3 DAYS ?Performed at [facility], [place] [address] [place], [place] [address] ?  ? Report Status PENDING  Incomplete  ?Culture, blood (Routine X 2) w Reflex to ID Panel     Status: None (Preliminary result)  ? Collection Time: 12/20/21  4:54 PM  ? Specimen: BLOOD  ?Result Value Ref Range Status  ? Specimen Description   Final  ?  BLOOD RIGHT ANTECUBITAL ?Performed at [facility], [place] [address], [place], [place] [address] ?  ? Special Requests   Final  ?  BOTTLES DRAWN AEROBIC AND ANAEROBIC Blood Culture adequate volume ?Performed at [facility], [place] [address], [place], [place] [address] ?  ? Culture   Final  ?  NO GROWTH 3 DAYS ?Performed at [facility], [place] [address], [place], [place] [address] ?  ? Report Status PENDING  Incomplete  ?  ? ? ?Radiology Studies: ?DG Abd Portable 1V ? ?Result Date: 12/22/2021 ?CLINICAL DATA:  Abdominal pain.   Sigmoid diverticulitis. EXAM: PORTABLE ABDOMEN - 1 VIEW COMPARISON:  CT 12/20/2021 FINDINGS: There are multiple dilated loops of small bowel within the central abdomen measuring up to 4.3 cm. Gas is noted within the colon up to the level of the distal sigmoid colon. IMPRESSION: Progressive small bowel dilatation is identified within the central abdomen. In the setting of acute diverticulitis findings are favored to represent ileus. Distal bowel obstruction is considered less favored. Electronically Signed   By: Kerby Moors M.D.   On: 12/22/2021 15:49   ? ? ? LOS: 3 days  ? ? ?Cordelia Poche, MD ?[facility] ?12/23/2021, 3:05 PM ? ? ?If 7PM-7AM, please contact night-coverage [email or web address] ? ?

## 2021-12-23 NOTE — Plan of Care (Signed)
  Problem: Activity: Goal: Risk for activity intolerance will decrease Outcome: Progressing   Problem: Elimination: Goal: Will not experience complications related to bowel motility Outcome: Progressing   

## 2021-12-24 ENCOUNTER — Inpatient Hospital Stay (HOSPITAL_COMMUNITY): Payer: Medicare PPO

## 2021-12-24 DIAGNOSIS — E876 Hypokalemia: Secondary | ICD-10-CM | POA: Diagnosis not present

## 2021-12-24 DIAGNOSIS — I10 Essential (primary) hypertension: Secondary | ICD-10-CM | POA: Diagnosis not present

## 2021-12-24 DIAGNOSIS — K5792 Diverticulitis of intestine, part unspecified, without perforation or abscess without bleeding: Secondary | ICD-10-CM | POA: Diagnosis not present

## 2021-12-24 DIAGNOSIS — I5032 Chronic diastolic (congestive) heart failure: Secondary | ICD-10-CM | POA: Diagnosis not present

## 2021-12-24 MED ORDER — AMOXICILLIN-POT CLAVULANATE 875-125 MG PO TABS: 1.0000 | ORAL_TABLET | Freq: Two times a day (BID) | ORAL | 0 refills | Status: AC

## 2021-12-24 NOTE — Discharge Instructions (Addendum)
Bobby [name]., ? ?You are in the hospital with acute diverticulitis.  You are treated with antibiotics with improvement of your symptoms.  During hospitalization, you developed some slowing of your intestines called ileus.  Thankfully, this has improved.  You will be discharged with antibiotics to complete a 10-day course of treatment. Please continue a low-fiber diet for the next three weeks, followed by a high-fiber diet. Please return if you have worsening abdominal pain, nausea and vomiting, none and if you are not passing gas. ?

## 2021-12-24 NOTE — Plan of Care (Signed)
?  Problem: Activity: ?Goal: Risk for activity intolerance will decrease ?Outcome: Progressing ?  ?Problem: Pain Managment: ?Goal: General experience of comfort will improve ?Outcome: Progressing ?  ?Problem: Safety: ?Goal: Ability to remain free from injury will improve ?Outcome: Progressing ?  ?Problem: Clinical Measurements: ?Goal: Diagnostic test results will improve ?Outcome: Progressing ?  ?

## 2021-12-24 NOTE — Discharge Summary (Signed)
?Physician Discharge Summary ?  ?Patient: Bobby [name]. MRN: [id number] DOB: 1950/11/06  ?Admit date:     12/20/2021  ?Discharge date: 12/24/21  ?Discharge Physician: Cordelia Poche, MD  ? ?PCP: [name], MD  ? ?Recommendations at discharge:  ? ?PCP follow-up ? ?Discharge Diagnoses: ?Principal Problem: ?  Acute diverticulitis ?Active Problems: ?  Essential hypertension ?  Coronary artery disease involving native coronary artery of native heart ?  Chronic heart failure with preserved ejection fraction ([place]) ?  Malignant neoplasm of prostate ([place]) ?  Mixed hyperlipidemia ?  Obstructive sleep apnea ?  Obesity (BMI 30-39.9) ?  Ileus ([place]) ? ?Resolved Problems: ?  Hypokalemia ?  Tachypnea ? ?Hospital Course: ?Bobby [name]. is a 71 y.o. male with medical history significant of prostate cancer, heart failure, hypertension, hyperlipidemia. Patient presented secondary to abdominal pain and was found to have sigmoid diverticulitis. Empiric antibiotics initiated with improvement of symptoms.  Hospitalization complicated by ileus which has improved prior to discharge. ? ?Assessment and Plan: ?* Acute diverticulitis ?Patient with associated leukocytosis.  Afebrile.  CT abdomen/pelvis significant for acute sigmoid diverticulitis and midline of lower abdomen/upper pelvis without evidence of abscess.  Zosyn initiated in the emergency department.  Leukocytosis resolved. Ileus seen on abdominal x-ray.  Patient transition to Augmentin to complete a 10-day course of antibiotics. ? ?Ileus ([place]) ?Secondary to diverticulitis. Previously with nausea and vomiting which has resolved.  Patient with improved bowel function with bowel movements and has persistently been able to pass gas.  Abdominal distention improved prior to discharge.  Patient given return precautions on discharge. ? ?Chronic heart failure with preserved ejection fraction ([place]) ?Patient follows with Dr. Terri Skains as an outpatient. LVEF of 60-65% on most  recent Transthoracic Echocardiogram with grade 2 diastolic dysfunction.  Patient is on Entresto as an outpatient.  Stable. Continue outpatient regimen. ? ?Coronary artery disease involving native coronary artery of native heart ?Patient is on aspirin and Lipitor as an outpatient. Continue home aspirin and Lipitor. ? ?Essential hypertension ?Blood pressure currently well controlled.  Patient is on Entresto and diltiazem as an outpatient. Continue home Entresto and diltiazem. ? ?Tachypnea-resolved as of 12/20/2021 ?Seems likely related to IV fluid resuscitation initially provided to the patient.  Symptoms significantly improved after IV Lasix. ? ?Hypokalemia-resolved as of 12/21/2021 ?Supplementation given. Resolved. ? ?Obesity (BMI 30-39.9) ?Body mass index is 36.86 kg/m?. ? ?Obstructive sleep apnea ?Continue CPAP nightly. ? ?Mixed hyperlipidemia ?Continue home Lipitor. ? ?Malignant neoplasm of prostate ([place]) ?Patient follows with Dr. Alen Blew as an outpatient.  He is currently on Eligard every 4 months in addition to [place] daily with prednisone Continue Zytiga and prednisone on discharge. ? ? ?Consultants: None ?Procedures performed: None  ?Disposition: Home ?Diet recommendation: Soft diet/low fiber ? ?DISCHARGE MEDICATION: ?Allergies as of 12/24/2021   ?No Known Allergies ?  ? ?  ?Medication List  ?  ? ?STOP taking these medications   ? ?lidocaine 2 % solution ?Commonly known as: XYLOCAINE ?  ?nystatin 100000 UNIT/ML suspension ?Commonly known as: MYCOSTATIN ?  ? ?  ? ?TAKE these medications   ? ?abiraterone acetate 250 MG tablet ?Commonly known as: ZYTIGA ?Take 1,000 mg by mouth daily. Take on an empty stomach 1 hour before or 2 hours after a meal ?What changed: Another medication with the same name was removed. Continue taking this medication, and follow the directions you see here. ?  ?amoxicillin-clavulanate 875-125 MG tablet ?Commonly known as: Augmentin ?Take 1 tablet by mouth 2 (two)  times daily for 6 days. ?   ?arformoterol 15 MCG/2ML Nebu ?Commonly known as: BROVANA ?Take 2 mLs (15 mcg total) by nebulization 2 (two) times daily. ?  ?aspirin EC 81 MG tablet ?Take 1 tablet (81 mg total) by mouth daily. ?  ?atorvastatin 40 MG tablet ?Commonly known as: LIPITOR ?TAKE ONE TABLET BY MOUTH (40 MG total) DAILY ?What changed: See the new instructions. ?  ?budesonide 0.25 MG/2ML nebulizer solution ?Commonly known as: Pulmicort ?Take 2 mLs (0.25 mg total) by nebulization in the morning and at bedtime. ?  ?diltiazem 360 MG 24 hr capsule ?Commonly known as: CARDIZEM CD ?TAKE ONE CAPSULE BY MOUTH ONCE DAILY ?  ?empagliflozin 10 MG Tabs tablet ?Commonly known as: Jardiance ?Take 1 tablet (10 mg total) by mouth daily before breakfast. ?  ?Entresto 49-51 MG ?Generic drug: sacubitril-valsartan ?Take 1 tablet by mouth 2 (two) times daily. ?  ?lidocaine 5 % ?Commonly known as: LIDODERM ?Place 1 patch onto the skin daily. ?  ?meloxicam 15 MG tablet ?Commonly known as: MOBIC ?Take 15 mg by mouth daily. ?  ?methocarbamol 500 MG tablet ?Commonly known as: ROBAXIN ?Take 1 tablet by mouth daily as needed for muscle spasms. ?  ?multivitamin with minerals tablet ?Take 1 tablet by mouth daily. ?  ?Neurontin 300 MG capsule ?Generic drug: gabapentin ?Take 300 mg by mouth 3 (three) times daily as needed (For pain). ?  ?predniSONE 5 MG tablet ?Commonly known as: DELTASONE ?Take 5 mg by mouth daily with breakfast. ?  ?solifenacin 5 MG tablet ?Commonly known as: VESICARE ?Take 5 mg by mouth daily. ?  ?tamsulosin 0.4 MG Caps capsule ?Commonly known as: FLOMAX ?Take 1 capsule (0.4 mg total) by mouth daily after supper. ?What changed: when to take this ?  ?traMADol 50 MG tablet ?Commonly known as: ULTRAM ?Take 50 mg by mouth 2 (two) times daily as needed for moderate pain. ?  ? ?  ? ? Follow-up Information   ? ? [name], MD. Schedule an appointment as soon as possible for a visit in 1 week(s).   ?Specialty: Endocrinology ?Why: For hospital  follow-up ?Contact information: ?[address] ?[place] [address] ?[id number] ? ? ?  ?  ? ?  ?  ? ?  ? ?Discharge Exam: ? ?BP 116/69 (BP Location: Right Arm)   Pulse 75   Temp 98.1 ?F (36.7 ?C)   Resp 16   Ht 5' 6.5" (1.689 m)   Wt 106.7 kg   SpO2 96%   BMI 37.41 kg/m?  ? ?General exam: Appears calm and comfortable ?Respiratory system: Clear to auscultation. Respiratory effort normal. ?Cardiovascular system: S1 & S2 heard, RRR. No murmurs, rubs, gallops or clicks. ?Gastrointestinal system: Abdomen is distended, soft and nontender. Normal bowel sounds heard. ?Central nervous system: Alert and oriented. No focal neurological deficits. ?Musculoskeletal: 1+ pitting edema. No calf tenderness ?Skin: No cyanosis. No rashes ?Psychiatry: Judgement and insight appear normal. Mood & affect appropriate.  ? ?Condition at discharge: stable ? ?The results of significant diagnostics from this hospitalization (including imaging, microbiology, ancillary and laboratory) are listed below for reference.  ? ?Imaging Studies: ?DG Chest 2 View ? ?Result Date: 12/20/2021 ?CLINICAL DATA:  Shortness of breath EXAM: CHEST - 2 VIEW COMPARISON:  05/18/2020 FINDINGS: Stable cardiomegaly with vascular congestion and basilar atelectasis versus scarring. No definite pneumonia, CHF pattern, large effusion or pneumothorax. Trachea midline. Aorta atherosclerotic. IMPRESSION: Cardiomegaly with vascular congestion and basilar atelectasis. No interval change. Aortic Atherosclerosis (ICD10-I70.0). Electronically Signed  By: Eugenie Filler M.D.   On: 12/20/2021 12:39  ? ?CT Abdomen Pelvis W Contrast ? ?Result Date: 12/20/2021 ?CLINICAL DATA:  Nausea, vomiting, lower abdominal and pelvic pain EXAM: CT ABDOMEN AND PELVIS WITH CONTRAST TECHNIQUE: Multidetector CT imaging of the abdomen and pelvis was performed using the standard protocol following bolus administration of intravenous contrast. RADIATION DOSE REDUCTION: This exam was performed according  to the departmental dose-optimization program which includes automated exposure control, adjustment of the mA and/or kV according to patient size and/or use of iterative reconstruction technique. CONTRAST:  140m OMNIPAQUE

## 2021-12-24 NOTE — Plan of Care (Signed)
Discharge instructions given to the patient including medications and low fiber diet plan.  ?

## 2021-12-25 LAB — CULTURE, BLOOD (ROUTINE X 2)
Culture: NO GROWTH
Culture: NO GROWTH
Special Requests: ADEQUATE
Special Requests: ADEQUATE

## 2022-01-07 ENCOUNTER — Other Ambulatory Visit (HOSPITAL_COMMUNITY): Payer: Self-pay

## 2022-01-07 ENCOUNTER — Other Ambulatory Visit: Payer: Self-pay | Admitting: Oncology

## 2022-01-07 DIAGNOSIS — C61 Malignant neoplasm of prostate: Secondary | ICD-10-CM

## 2022-01-07 MED ORDER — ABIRATERONE ACETATE 250 MG PO TABS
1000.0000 mg | ORAL_TABLET | Freq: Every day | ORAL | 0 refills | Status: DC
  Filled 2022-01-09: qty 120, 30d supply, fill #0

## 2022-01-09 ENCOUNTER — Other Ambulatory Visit (HOSPITAL_COMMUNITY): Payer: Self-pay

## 2022-01-14 ENCOUNTER — Other Ambulatory Visit (HOSPITAL_COMMUNITY): Payer: Self-pay

## 2022-01-21 ENCOUNTER — Other Ambulatory Visit: Payer: Self-pay | Admitting: Cardiology

## 2022-02-19 ENCOUNTER — Other Ambulatory Visit: Payer: Self-pay | Admitting: Oncology

## 2022-02-19 ENCOUNTER — Other Ambulatory Visit (HOSPITAL_COMMUNITY): Payer: Self-pay

## 2022-02-19 DIAGNOSIS — C61 Malignant neoplasm of prostate: Secondary | ICD-10-CM

## 2022-02-19 MED ORDER — ABIRATERONE ACETATE 250 MG PO TABS
1000.0000 mg | ORAL_TABLET | Freq: Every day | ORAL | 0 refills | Status: DC
  Filled 2022-02-19: qty 120, 30d supply, fill #0

## 2022-02-22 ENCOUNTER — Other Ambulatory Visit (HOSPITAL_COMMUNITY): Payer: Self-pay

## 2022-03-06 ENCOUNTER — Inpatient Hospital Stay: Payer: Medicare PPO

## 2022-03-06 ENCOUNTER — Inpatient Hospital Stay: Payer: Medicare PPO | Admitting: Oncology

## 2022-03-07 ENCOUNTER — Other Ambulatory Visit: Payer: Medicare PPO

## 2022-03-07 ENCOUNTER — Ambulatory Visit: Payer: Medicare PPO | Admitting: Oncology

## 2022-03-07 ENCOUNTER — Ambulatory Visit: Payer: Medicare PPO

## 2022-03-14 ENCOUNTER — Other Ambulatory Visit: Payer: Self-pay | Admitting: Oncology

## 2022-03-14 ENCOUNTER — Other Ambulatory Visit (HOSPITAL_COMMUNITY): Payer: Self-pay

## 2022-03-14 DIAGNOSIS — C61 Malignant neoplasm of prostate: Secondary | ICD-10-CM

## 2022-03-14 MED ORDER — ABIRATERONE ACETATE 250 MG PO TABS
1000.0000 mg | ORAL_TABLET | Freq: Every day | ORAL | 0 refills | Status: DC
  Filled 2022-03-14: qty 120, 30d supply, fill #0

## 2022-03-18 ENCOUNTER — Other Ambulatory Visit (HOSPITAL_COMMUNITY): Payer: Self-pay

## 2022-03-27 ENCOUNTER — Ambulatory Visit: Payer: Medicare PPO | Admitting: Cardiology

## 2022-03-28 ENCOUNTER — Encounter: Payer: Self-pay | Admitting: Cardiology

## 2022-03-28 ENCOUNTER — Ambulatory Visit: Payer: Medicare PPO | Admitting: Cardiology

## 2022-03-28 VITALS — BP 124/57 | HR 75 | Temp 98.0°F | Resp 17 | Ht 66.5 in | Wt 241.0 lb

## 2022-03-28 DIAGNOSIS — Z87891 Personal history of nicotine dependence: Secondary | ICD-10-CM

## 2022-03-28 DIAGNOSIS — I5032 Chronic diastolic (congestive) heart failure: Secondary | ICD-10-CM

## 2022-03-28 DIAGNOSIS — I10 Essential (primary) hypertension: Secondary | ICD-10-CM

## 2022-03-28 DIAGNOSIS — I2510 Atherosclerotic heart disease of native coronary artery without angina pectoris: Secondary | ICD-10-CM

## 2022-03-28 DIAGNOSIS — E782 Mixed hyperlipidemia: Secondary | ICD-10-CM

## 2022-03-28 NOTE — Progress Notes (Signed)
ID:  Bobby Forster., DOB 1951/02/19, MRN [id number]  PCP:  [name], MD  Cardiologist: Rex Kras, DO, [facility] (established care 01/21/2020)  Date: 03/28/22 Last Office Visit: 09/27/2021  Chief Complaint  Patient presents with   heart failure management   Follow-up    Bobby Carmickle. is a 71 y.o. male whose past medical history and cardiovascular risk factors are: Nonobstructive coronary artery disease, severe coronary artery calcification 1101 AU, chronic HFpEF/stage C/NYHA class II, COVID infection (08/2021), prediabetes, hyperlipidemia, hypertension, advanced age, former smoker, obesity due to excess calories.  Patient is accompanied by his wife Bobby [name] at today's visit.  Originally referred to the practice for evaluation of CAC.  He underwent ischemic work-up as outlined below and was noted to have HFpEF and started on guideline directed medical therapy.  He now presents for 21-monthfollow-up visit.  Since last office visit his EDelene Lollhas been uptitrated to 97/103 mg p.o. twice daily.  Over the last 6 months patient states that his shortness of breath has remained relatively stable predominately with effort related activities.  At times he does have lower extremity swelling worse at night and better in the morning.  He is currently on maximally tolerated medical therapy with torsemide to use on a as needed basis.  No hospitalizations for congestive heart failure or angina pectoris.  FUNCTIONAL STATUS: Walking more with grandkids, as per patient's wife.  ALLERGIES: No Known Allergies  MEDICATION LIST PRIOR TO VISIT: Current Meds  Medication Sig   abiraterone acetate (ZYTIGA) 250 MG tablet Take 1,000 mg by mouth daily. Take on an empty stomach 1 hour before or 2 hours after a meal   abiraterone acetate (ZYTIGA) 250 MG tablet Take 4 tablets (1,000 mg total) by mouth daily. Take on an empty stomach 1 hour before or 2 hours after a meal   aspirin EC 81 MG tablet  Take 1 tablet (81 mg total) by mouth daily.   atorvastatin (LIPITOR) 40 MG tablet TAKE ONE TABLET BY MOUTH (40 MG total) DAILY   diltiazem (CARDIZEM CD) 360 MG 24 hr capsule TAKE ONE CAPSULE BY MOUTH ONCE DAILY (Patient taking differently: Take 360 mg by mouth daily.)   empagliflozin (JARDIANCE) 10 MG TABS tablet Take 1 tablet (10 mg total) by mouth daily before breakfast.   lidocaine (LIDODERM) 5 % Place 1 patch onto the skin daily.   meloxicam (MOBIC) 15 MG tablet Take 15 mg by mouth daily.   methocarbamol (ROBAXIN) 500 MG tablet Take 1 tablet by mouth daily as needed for muscle spasms.    NEURONTIN 300 MG capsule Take 300 mg by mouth 3 (three) times daily as needed (For pain).   predniSONE (DELTASONE) 5 MG tablet Take 5 mg by mouth daily with breakfast.   sacubitril-valsartan (ENTRESTO) 97-103 MG Take 1 tablet by mouth 2 (two) times daily.   solifenacin (VESICARE) 5 MG tablet Take 5 mg by mouth daily.   tamsulosin (FLOMAX) 0.4 MG CAPS capsule Take 1 capsule (0.4 mg total) by mouth daily after supper. (Patient taking differently: Take 0.4 mg by mouth at bedtime.)   traMADol (ULTRAM) 50 MG tablet Take 50 mg by mouth 2 (two) times daily as needed for moderate pain.    [DISCONTINUED] losartan (COZAAR) 50 MG tablet Take 50 mg by mouth daily.     PAST MEDICAL HISTORY: Past Medical History:  Diagnosis Date   Bronchitis    CHF (congestive heart failure) ([facility] 04/2020   A/C HFpEF   Coronary  artery calcification of native artery    Family history of pancreatic cancer    History of kidney stones    Hyperlipidemia    Hypertension    Pneumonia    Prostate cancer [facility])     PAST SURGICAL HISTORY: Past Surgical History:  Procedure Laterality Date   EXTRACORPOREAL SHOCK WAVE LITHOTRIPSY Right 04/17/2017   Procedure: RIGHT EXTRACORPOREAL SHOCK WAVE LITHOTRIPSY (ESWL);  Surgeon: Kathie Rhodes, MD;  Location: [facility];  Service: Urology;  Laterality: Right;   HERNIA REPAIR     age 78   LEFT HEART  CATH AND CORONARY ANGIOGRAPHY N/A 05/08/2020   Procedure: LEFT HEART CATH AND CORONARY ANGIOGRAPHY;  Surgeon: Adrian Prows, MD;  Location: [facility];  Service: Cardiovascular;  Laterality: N/A;   PROSTATE BIOPSY      FAMILY HISTORY: The patient family history includes Breast cancer in his maternal aunt; Breast cancer (age of onset: 33) in his sister; Kidney cancer in his maternal aunt; Pancreatic cancer (age of onset: 78) in his brother.  SOCIAL HISTORY:  The patient  reports that he quit smoking about 10 years ago. His smoking use included cigarettes. He has a 2.50 pack-year smoking history. He has never used smokeless tobacco. He reports that he does not drink alcohol and does not use drugs.  REVIEW OF SYSTEMS: Review of Systems  Constitutional: Positive for weight loss. Negative for chills, fever, malaise/fatigue and weight gain.  HENT:  Negative for hoarse voice and nosebleeds.   Eyes:  Negative for discharge, double vision and pain.  Cardiovascular:  Positive for dyspnea on exertion (improved) and leg swelling. Negative for chest pain, claudication, near-syncope, orthopnea, palpitations, paroxysmal nocturnal dyspnea and syncope.  Respiratory:  Positive for shortness of breath (improved). Negative for hemoptysis.   Musculoskeletal:  Negative for muscle cramps and myalgias.  Gastrointestinal:  Negative for abdominal pain, constipation, diarrhea, hematemesis, hematochezia, melena, nausea and vomiting.  Neurological:  Negative for dizziness and light-headedness.    PHYSICAL EXAM:    03/28/2022    3:19 PM 12/24/2021    5:36 AM 12/23/2021    9:54 PM  Vitals with BMI  Height 5' 6.5"    Weight 241 lbs    BMI 24.40    Systolic 102 725 366  Diastolic 57 69 66  Pulse 75 75 81   CONSTITUTIONAL: Well-developed and well-nourished. No acute distress.  SKIN: Skin is warm and dry. No rash noted. No cyanosis. No pallor. No jaundice HEAD: Normocephalic and atraumatic.  EYES: No scleral  icterus MOUTH/THROAT: Moist oral membranes.  NECK: No JVD present. No thyromegaly noted. No carotid bruits  CHEST Normal respiratory effort. No intercostal retractions  LUNGS: Clear to auscultation bilaterally. No stridor. No wheezes. No rales.  CARDIOVASCULAR: Regular rate and rhythm, positive S1-S2, no murmurs rubs or gallops appreciated. ABDOMINAL: Obese, soft, nontender, nondistended, positive bowel sounds all 4 quadrants. No apparent ascites.  EXTREMITIES: +1 bilateral pitting peripheral edema, warm to touch,   HEMATOLOGIC: No significant bruising NEUROLOGIC: Oriented to person, place, and time. Nonfocal. Normal muscle tone.  PSYCHIATRIC: Normal mood and affect. Normal behavior. Cooperative  RADIOLOGY: CT angio chest PE with and without contrast September 02, 2019:Cardiovascular: There is mild cardiomegaly. No pericardial effusion. There is multi vessel coronary vascular calcification. There is mild atherosclerotic calcification of the thoracic aorta.  CARDIAC DATABASE: EKG: 09/27/2021: NSR, 81bpm, first degree AV Block, diffuse nonspecific T wave abnormalities.  03/28/2022: Normal sinus rhythm, 69bpm, first-degree AV block, without underlying injury pattern.  Echocardiogram: 05/06/2020: LVEF  60-65%, hyperdynamic LVEF, moderate LVH, grade 2 diastolic impairment, elevated LVEDP, RV systolic function and size are within normal limits, trivial TR, trivial MR, mild to moderate aortic valve sclerosis without stenosis.  Stress Testing: Lexiscan/modified Bruce Tetrofosmin stress test 02/23/2020: Stress EKG showed sinus tachycardia, inferolateral T wave inversion.  SPECT images show small sized, medium intensity, mid to basal inferolateral perfusion defect with mild reversibility. Stress LVEF 82%. Low risk study.  Coronary CTA 04/26/2020: 1. Coronary calcium score of 1101. This was 89th percentile for age and sex matched control. 2. Normal coronary origin with right dominance. 3.  Minimal  calcified plaque within the left main artery. Moderate to severe stenosis within the proximal/mid LAD segment to calcified plaque. Severe stenosis at the ostial segment of the 1st diagonal branch due calcified plaque. Severe stenosis within the proximal segment due to eccentric calcified plaque within the LCX. Moderate stenosis within the distal RCA due to calcified plaque. Of note, severity of the stenosis may be overestimated due to blooming artifact caused by calcified plaque. 4. CADRADS = 4. Cardiac catheterization or CT FFR is recommended. Consider symptom-guided anti-ischemic pharmacotherapy as well as risk factor modification per guideline directed care. Invasive coronary angiography recommended with revascularization per published guideline statements. 4. Study is sent for CT-FFR findings will be performed and reported   CT-FFR 04/27/2020: CT FFR analysis showed no significant stenosis.  Heart Catheterization: Left Heart Catheterization 05/08/20:  Hyperdynamic LVEF, EF 65 to 70%.  Mildly elevated LV EDP. Mild diffuse coronary artery disease with mild coronary calcification involving LAD, circumflex and dominant RCA.  LAD is very small and gives origin to a very large LAD: D1 which reaches the apex. Recommendation: Patient symptoms are related to acute diastolic heart failure with elevated LVEDP.  LABORATORY DATA:    Latest Ref Rng & Units 12/23/2021    3:12 AM 12/22/2021    3:32 AM 12/21/2021    3:46 AM  CBC  WBC 4.0 - 10.5 K/uL 8.2  13.4  18.8   Hemoglobin 13.0 - 17.0 g/dL 11.9  11.7  12.0   Hematocrit 39.0 - 52.0 % 38.0  37.5  36.4   Platelets 150 - 400 K/uL 285  246  211        Latest Ref Rng & Units 12/21/2021    3:46 AM 12/20/2021   12:28 PM 11/07/2021    8:54 AM  CMP  Glucose 70 - 99 mg/dL 99  105  129   BUN 8 - 23 mg/dL '18  15  18   '$ Creatinine 0.61 - 1.24 mg/dL 1.10  0.76  0.92   Sodium 135 - 145 mmol/L 135  137  140   Potassium 3.5 - 5.1 mmol/L 3.7  3.2  3.8    Chloride 98 - 111 mmol/L 106  105  109   CO2 22 - 32 mmol/L '20  23  24   '$ Calcium 8.9 - 10.3 mg/dL 8.6  9.1  9.1   Total Protein 6.5 - 8.1 g/dL  7.3  6.3   Total Bilirubin 0.3 - 1.2 mg/dL  1.2  0.4   Alkaline Phos 38 - 126 U/L  86  89   AST 15 - 41 U/L  14  10   ALT 0 - 44 U/L  11  10    Hepatic Function Panel Recent Labs    06/19/21 0922 11/07/21 0854 12/20/21 1228  PROT 6.9 6.3* 7.3  ALBUMIN 3.9 3.8 4.0  AST 11* 10* 14*  ALT 12 10  11  ALKPHOS 106 89 86  BILITOT 0.6 0.4 1.2   Lab Results  Component Value Date   CHOL 122 03/16/2020   HDL 45 03/16/2020   LDLCALC 55 03/16/2020   TRIG 123 03/16/2020   External Labs: Lipid Panel 12/30/2019: total 155, Triglycerides 167, HDL 44, LDL 78  12/30/2019: A1C 5.4% Glucose Random 115 BUN 14, Creatinine, Serum 0.9  03/19/2019: PSA normal   06/05/2018: TSH 1.000  IMPRESSION:    ICD-10-CM   1. Chronic heart failure with preserved ejection fraction (HFpEF) (HCC)  I50.32 EKG 12-Lead    Pro b natriuretic peptide (BNP)    Basic metabolic panel    Magnesium    2. Nonobstructive atherosclerosis of coronary artery  I25.10     3. Mixed hyperlipidemia  E78.2     4. Benign hypertension  I10     5. Former smoker  Z87.891     79. Class 2 severe obesity due to excess calories with serious comorbidity and body mass index (BMI) of 37.0 to 37.9 in adult [facility])  E66.01    Z68.37        RECOMMENDATIONS: Bobby Sauber. is a 71 y.o. male whose past medical history and cardiac risk factors include: Nonobstructive coronary artery disease, severe coronary artery calcification 1101 AU, chronic HFpEF/stage C/NYHA class II, prediabetes, hyperlipidemia, hypertension, advanced age, former smoker, obesity due to excess calories.  Chronic heart failure with preserved ejection fraction (HFpEF) ([place]) Has remained stable over the last 6 months. Has lost weight with lifestyle changes and remains overall euvolemic with minimal lower extremity  swelling which improves by morning likely secondary to chronic venous insufficiency. Medications reconciled. Re emphasized the importance of a low-salt diet, strict I's and O's, daily weights. Increase physical activity to 30 minutes a day 5 days a week. Check BMP, NT proBNP, magnesium levels -if labs remain stable will start low torsemide; otherwise, on prn basis.   Nonobstructive atherosclerosis of coronary artery Continue aspirin and statin therapy. No angina pectoris since last office visit. Medications reconciled.  Educated importance of improving his modifiable cardiovascular risk factors.  Mixed hyperlipidemia Currently on atorvastatin.   He denies myalgia or other side effects. Recommend a goal LDL of less than 70 mg/dL. Recommend having fasting lipid profile done with his PCP at the next annual well visit.  Benign hypertension Office blood pressures are well controlled. Continue current medical therapy  Obstructive sleep apnea on CPAP: Patient is compliant with his CPAP usage and reinforced the importance.   FINAL MEDICATION LIST END OF ENCOUNTER: No orders of the defined types were placed in this encounter.    Current Outpatient Medications:    abiraterone acetate (ZYTIGA) 250 MG tablet, Take 1,000 mg by mouth daily. Take on an empty stomach 1 hour before or 2 hours after a meal, Disp: , Rfl:    abiraterone acetate (ZYTIGA) 250 MG tablet, Take 4 tablets (1,000 mg total) by mouth daily. Take on an empty stomach 1 hour before or 2 hours after a meal, Disp: 120 tablet, Rfl: 0   aspirin EC 81 MG tablet, Take 1 tablet (81 mg total) by mouth daily., Disp: 90 tablet, Rfl: 3   atorvastatin (LIPITOR) 40 MG tablet, TAKE ONE TABLET BY MOUTH (40 MG total) DAILY, Disp: 90 tablet, Rfl: 0   diltiazem (CARDIZEM CD) 360 MG 24 hr capsule, TAKE ONE CAPSULE BY MOUTH ONCE DAILY (Patient taking differently: Take 360 mg by mouth daily.), Disp: 90 capsule, Rfl: 0   empagliflozin (  JARDIANCE) 10  MG TABS tablet, Take 1 tablet (10 mg total) by mouth daily before breakfast., Disp: 90 tablet, Rfl: 3   lidocaine (LIDODERM) 5 %, Place 1 patch onto the skin daily., Disp: , Rfl:    meloxicam (MOBIC) 15 MG tablet, Take 15 mg by mouth daily., Disp: , Rfl:    methocarbamol (ROBAXIN) 500 MG tablet, Take 1 tablet by mouth daily as needed for muscle spasms. , Disp: , Rfl:    NEURONTIN 300 MG capsule, Take 300 mg by mouth 3 (three) times daily as needed (For pain)., Disp: , Rfl:    predniSONE (DELTASONE) 5 MG tablet, Take 5 mg by mouth daily with breakfast., Disp: , Rfl:    sacubitril-valsartan (ENTRESTO) 97-103 MG, Take 1 tablet by mouth 2 (two) times daily., Disp: , Rfl:    solifenacin (VESICARE) 5 MG tablet, Take 5 mg by mouth daily., Disp: , Rfl:    tamsulosin (FLOMAX) 0.4 MG CAPS capsule, Take 1 capsule (0.4 mg total) by mouth daily after supper. (Patient taking differently: Take 0.4 mg by mouth at bedtime.), Disp: 30 capsule, Rfl: 0   traMADol (ULTRAM) 50 MG tablet, Take 50 mg by mouth 2 (two) times daily as needed for moderate pain. , Disp: , Rfl:   Orders Placed This Encounter  Procedures   Pro b natriuretic peptide (BNP)   Basic metabolic panel   Magnesium   EKG 12-Lead    --Continue cardiac medications as reconciled in final medication list. --Return in about 6 months (around 09/28/2022) for Follow up, heart failure management.. Or sooner if needed. --Continue follow-up with your primary care physician regarding the management of your other chronic comorbid conditions.  Patient's questions and concerns were addressed to his satisfaction. He voices understanding of the instructions provided during this encounter.   This note was created using a voice recognition software as a result there may be grammatical errors inadvertently enclosed that do not reflect the nature of this encounter. Every attempt is made to correct such errors.  Rex Kras, [place],  [facility]  Pager: [phone] Office:  [phone]

## 2022-04-02 LAB — BASIC METABOLIC PANEL
BUN/Creatinine Ratio: 25 — ABNORMAL HIGH (ref 10–24)
BUN: 24 mg/dL (ref 8–27)
CO2: 23 mmol/L (ref 20–29)
Calcium: 9.3 mg/dL (ref 8.6–10.2)
Chloride: 108 mmol/L — ABNORMAL HIGH (ref 96–106)
Creatinine, Ser: 0.97 mg/dL (ref 0.76–1.27)
Glucose: 103 mg/dL — ABNORMAL HIGH (ref 70–99)
Potassium: 4.7 mmol/L (ref 3.5–5.2)
Sodium: 143 mmol/L (ref 134–144)
eGFR: 84 mL/min/{1.73_m2} (ref >59)

## 2022-04-02 LAB — PRO B NATRIURETIC PEPTIDE: NT-Pro BNP: 172 pg/mL (ref 0–376)

## 2022-04-02 LAB — MAGNESIUM: Magnesium: 2.4 mg/dL — ABNORMAL HIGH (ref 1.6–2.3)

## 2022-04-04 ENCOUNTER — Other Ambulatory Visit (HOSPITAL_COMMUNITY): Payer: Self-pay

## 2022-04-04 ENCOUNTER — Other Ambulatory Visit: Payer: Self-pay | Admitting: Oncology

## 2022-04-04 DIAGNOSIS — C61 Malignant neoplasm of prostate: Secondary | ICD-10-CM

## 2022-04-04 MED ORDER — ABIRATERONE ACETATE 250 MG PO TABS
1000.0000 mg | ORAL_TABLET | Freq: Every day | ORAL | 0 refills | Status: DC
  Filled 2022-04-04: qty 120, 30d supply, fill #0

## 2022-04-08 ENCOUNTER — Other Ambulatory Visit (HOSPITAL_COMMUNITY): Payer: Self-pay

## 2022-04-09 NOTE — Progress Notes (Signed)
Called and spoke with patient regarding his recent lab results.  Patient said he has not taken the Torsemide in a week and wants to know if you think this why he was feeling bad yesterday? Please advise.

## 2022-04-10 ENCOUNTER — Other Ambulatory Visit (HOSPITAL_COMMUNITY): Payer: Self-pay

## 2022-04-10 ENCOUNTER — Telehealth: Payer: Self-pay

## 2022-04-10 NOTE — Telephone Encounter (Signed)
Today and yesterday Bobby [name] has had some symptomatic hypotensive episodes. SBP avg 99 compared to last months SBP avg was 115. He is dizzy, lightheaded and lethargic. The symptoms are worse when he stands up quickly but have improved in those instances after telling him to stand up slowly. He did have episodes of diarrhea on Monday but has been re-hydrating since then.   What do you think about decreasing or holding either his diltiazem or entresto?

## 2022-04-11 NOTE — Telephone Encounter (Signed)
Per conversation with Dr. Terri Skains. Will have patient hold dose of Entresto if SBP<110 and reiterate torsemide is PRN for weight maintenance.

## 2022-04-12 NOTE — Telephone Encounter (Signed)
Please let him know to call us back if he is holding his Entresto often.   Dr. Terri Skains

## 2022-04-15 DIAGNOSIS — M5416 Radiculopathy, lumbar region: Secondary | ICD-10-CM | POA: Diagnosis not present

## 2022-04-15 DIAGNOSIS — I10 Essential (primary) hypertension: Secondary | ICD-10-CM | POA: Diagnosis not present

## 2022-04-16 ENCOUNTER — Inpatient Hospital Stay: Payer: Medicare PPO | Attending: Oncology

## 2022-04-16 ENCOUNTER — Inpatient Hospital Stay: Payer: Medicare PPO | Admitting: Oncology

## 2022-04-16 ENCOUNTER — Other Ambulatory Visit: Payer: Self-pay

## 2022-04-16 ENCOUNTER — Telehealth: Payer: Self-pay

## 2022-04-16 ENCOUNTER — Inpatient Hospital Stay: Payer: Medicare PPO

## 2022-04-16 VITALS — BP 102/58 | HR 81 | Temp 97.7°F | Resp 18 | Ht 66.5 in | Wt 240.3 lb

## 2022-04-16 DIAGNOSIS — Z5111 Encounter for antineoplastic chemotherapy: Secondary | ICD-10-CM | POA: Diagnosis not present

## 2022-04-16 DIAGNOSIS — D509 Iron deficiency anemia, unspecified: Secondary | ICD-10-CM

## 2022-04-16 DIAGNOSIS — C61 Malignant neoplasm of prostate: Secondary | ICD-10-CM

## 2022-04-16 LAB — CBC WITH DIFFERENTIAL (CANCER CENTER ONLY)
Abs Immature Granulocytes: 0.01 10*3/uL (ref 0.00–0.07)
Basophils Absolute: 0.1 10*3/uL (ref 0.0–0.1)
Basophils Relative: 1 %
Eosinophils Absolute: 0.2 10*3/uL (ref 0.0–0.5)
Eosinophils Relative: 3 %
HCT: 27.2 % — ABNORMAL LOW (ref 39.0–52.0)
Hemoglobin: 8.5 g/dL — ABNORMAL LOW (ref 13.0–17.0)
Immature Granulocytes: 0 %
Lymphocytes Relative: 18 %
Lymphs Abs: 1.2 10*3/uL (ref 0.7–4.0)
MCH: 23.6 pg — ABNORMAL LOW (ref 26.0–34.0)
MCHC: 31.3 g/dL (ref 30.0–36.0)
MCV: 75.6 fL — ABNORMAL LOW (ref 80.0–100.0)
Monocytes Absolute: 0.5 10*3/uL (ref 0.1–1.0)
Monocytes Relative: 8 %
Neutro Abs: 4.7 10*3/uL (ref 1.7–7.7)
Neutrophils Relative %: 70 %
Platelet Count: 303 10*3/uL (ref 150–400)
RBC: 3.6 MIL/uL — ABNORMAL LOW (ref 4.22–5.81)
RDW: 18.6 % — ABNORMAL HIGH (ref 11.5–15.5)
WBC Count: 6.7 10*3/uL (ref 4.0–10.5)
nRBC: 0 % (ref 0.0–0.2)

## 2022-04-16 LAB — CMP (CANCER CENTER ONLY)
ALT: 10 U/L (ref 0–44)
AST: 9 U/L — ABNORMAL LOW (ref 15–41)
Albumin: 3.9 g/dL (ref 3.5–5.0)
Alkaline Phosphatase: 83 U/L (ref 38–126)
Anion gap: 5 (ref 5–15)
BUN: 19 mg/dL (ref 8–23)
CO2: 27 mmol/L (ref 22–32)
Calcium: 8.9 mg/dL (ref 8.9–10.3)
Chloride: 108 mmol/L (ref 98–111)
Creatinine: 1.03 mg/dL (ref 0.61–1.24)
GFR, Estimated: >60 mL/min (ref >60)
Glucose, Bld: 121 mg/dL — ABNORMAL HIGH (ref 70–99)
Potassium: 3.7 mmol/L (ref 3.5–5.1)
Sodium: 140 mmol/L (ref 135–145)
Total Bilirubin: 0.5 mg/dL (ref 0.3–1.2)
Total Protein: 6.2 g/dL — ABNORMAL LOW (ref 6.5–8.1)

## 2022-04-16 LAB — IRON AND IRON BINDING CAPACITY (CC-WL,HP ONLY)
Iron: 23 ug/dL — ABNORMAL LOW (ref 45–182)
Saturation Ratios: 6 % — ABNORMAL LOW (ref 17.9–39.5)
TIBC: 409 ug/dL (ref 250–450)
UIBC: 386 ug/dL — ABNORMAL HIGH (ref 117–376)

## 2022-04-16 LAB — FERRITIN: Ferritin: 5 ng/mL — ABNORMAL LOW (ref 24–336)

## 2022-04-16 MED ORDER — LEUPROLIDE ACETATE (4 MONTH) 30 MG ~~LOC~~ KIT
30.0000 mg | PACK | Freq: Once | SUBCUTANEOUS | Status: AC
  Administered 2022-04-16: 30 mg via SUBCUTANEOUS
  Filled 2022-04-16: qty 30

## 2022-04-16 MED ORDER — FERROUS SULFATE 325 (65 FE) MG PO TBEC
325.0000 mg | DELAYED_RELEASE_TABLET | Freq: Two times a day (BID) | ORAL | 3 refills | Status: DC
Start: 2022-04-16 — End: 2022-08-15

## 2022-04-16 NOTE — Telephone Encounter (Signed)
-----   Message from Wyatt Portela, MD sent at 04/16/2022  3:44 PM EDT ----- Please let him know his iron is low and needs to be on iron supplement. I will send an Rx for him to pick up

## 2022-04-16 NOTE — Progress Notes (Signed)
Hematology and Oncology Follow Up Visit  Bobby [name] [id number] 1950/10/16 71 y.o. 04/16/2022 10:16 AM [name], MDSouth, [name], MD   Principle Diagnosis: 71 year old man with advanced prostate cancer with lymphadenopathy diagnosed in 2018.  He has castration-sensitive disease with with lymphadenopathy, Gleason score 4+5 = 9 and PSA of 14.7.    Prior Therapy: He is status post prostate biopsy in August 2018.  Current therapy:  Eligard 30 mg every 4 months.  Next injection will be given on November 07, 2020.  Zytiga 1000 mg daily with prednisone 5 mg daily started in October 2018.  Interim History: [name] returns today for repeat evaluation.  Since last visit, he reports no major changes in his health.  He has reported some occasional dyspnea on exertion and fatigue but no other complaints.  He denies any nausea, vomiting or abdominal pain.  He denies any hematochezia, melena or hemoptysis.  He denies any changes in his bowel habits.  He was hospitalized back in April for diverticulitis and CT scan of the abdomen and pelvis did not show any acute pathology otherwise.     Medications: Updated on review. Current Outpatient Medications  Medication Sig Dispense Refill   abiraterone acetate (ZYTIGA) 250 MG tablet Take 1,000 mg by mouth daily. Take on an empty stomach 1 hour before or 2 hours after a meal     abiraterone acetate (ZYTIGA) 250 MG tablet Take 4 tablets (1,000 mg total) by mouth daily. Take on an empty stomach 1 hour before or 2 hours after a meal 120 tablet 0   aspirin EC 81 MG tablet Take 1 tablet (81 mg total) by mouth daily. 90 tablet 3   atorvastatin (LIPITOR) 40 MG tablet TAKE ONE TABLET BY MOUTH (40 MG total) DAILY 90 tablet 0   diltiazem (CARDIZEM CD) 360 MG 24 hr capsule TAKE ONE CAPSULE BY MOUTH ONCE DAILY (Patient taking differently: Take 360 mg by mouth daily.) 90 capsule 0   empagliflozin (JARDIANCE) 10 MG TABS tablet Take 1 tablet (10 mg total) by  mouth daily before breakfast. 90 tablet 3   lidocaine (LIDODERM) 5 % Place 1 patch onto the skin daily.     meloxicam (MOBIC) 15 MG tablet Take 15 mg by mouth daily.     methocarbamol (ROBAXIN) 500 MG tablet Take 1 tablet by mouth daily as needed for muscle spasms.      NEURONTIN 300 MG capsule Take 300 mg by mouth 3 (three) times daily as needed (For pain).     predniSONE (DELTASONE) 5 MG tablet Take 5 mg by mouth daily with breakfast.     sacubitril-valsartan (ENTRESTO) 97-103 MG Take 1 tablet by mouth 2 (two) times daily.     solifenacin (VESICARE) 5 MG tablet Take 5 mg by mouth daily.     tamsulosin (FLOMAX) 0.4 MG CAPS capsule Take 1 capsule (0.4 mg total) by mouth daily after supper. (Patient taking differently: Take 0.4 mg by mouth at bedtime.) 30 capsule 0   traMADol (ULTRAM) 50 MG tablet Take 50 mg by mouth 2 (two) times daily as needed for moderate pain.      No current facility-administered medications for this visit.     Allergies: No Known Allergies     Physical Exam:  Blood pressure (!) 102/58, pulse 81, temperature 97.7 F (36.5 C), temperature source Temporal, resp. rate 18, height 5' 6.5" (1.689 m), weight 240 lb 4.8 oz (109 kg), SpO2 99 %.  ECOG: 1      General appearance: Alert, awake without any distress. Head: Atraumatic without abnormalities Oropharynx: Without any thrush or ulcers. Eyes: No scleral icterus. Lymph nodes: No lymphadenopathy noted in the cervical, supraclavicular, or axillary nodes Heart:regular rate and rhythm, without any murmurs or gallops.   Lung: Clear to auscultation without any rhonchi, wheezes or dullness to percussion. Abdomin: Soft, nontender without any shifting dullness or ascites. Musculoskeletal: No clubbing or cyanosis. Neurological: No motor or sensory deficits. Skin: No rashes or lesions.                Lab Results: Lab Results  Component Value Date   WBC 8.2 12/23/2021   HGB 11.9 (L)  12/23/2021   HCT 38.0 (L) 12/23/2021   MCV 75.4 (L) 12/23/2021   PLT 285 12/23/2021     Chemistry      Component Value Date/Time   NA 143 04/01/2022 0830   NA 142 09/10/2017 1509   K 4.7 04/01/2022 0830   K 4.2 09/10/2017 1509   CL 108 (H) 04/01/2022 0830   CO2 23 04/01/2022 0830   CO2 25 09/10/2017 1509   BUN 24 04/01/2022 0830   BUN 19.4 09/10/2017 1509   CREATININE 0.97 04/01/2022 0830   CREATININE 0.92 11/07/2021 0854   CREATININE 1.2 09/10/2017 1509      Component Value Date/Time   CALCIUM 9.3 04/01/2022 0830   CALCIUM 9.7 09/10/2017 1509   ALKPHOS 86 12/20/2021 1228   ALKPHOS 84 09/10/2017 1509   AST 14 (L) 12/20/2021 1228   AST 10 (L) 11/07/2021 0854   AST 14 09/10/2017 1509   ALT 11 12/20/2021 1228   ALT 10 11/07/2021 0854   ALT 18 09/10/2017 1509   BILITOT 1.2 12/20/2021 1228   BILITOT 0.4 11/07/2021 0854   BILITOT 0.38 09/10/2017 1509                   Impression and Plan:  71 year old man with:   1.  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    The natural course of this disease was reviewed at this time and treatment choices were discussed.  Continues to tolerate Zytiga without any major complications.  Alternative treatment options including chemotherapy others will be deferred for the time being.  These will be utilized if he has progression of disease.  He is agreeable to proceed.  2.  Androgen deprivation therapy: He is currently on Eligard which will be repeated every 4 months.  Complication clinic weight gain, hot flashes sexual dysfunction were reiterated.  This will be continued indefinitely.  3.  Microcytic anemia: Etiology is unclear without any clear bleeding.  This could be related to iron losses from diverticulitis.  We will check iron studies and replace as needed.  4.  Electrolyte and liver function test monitoring: This will be monitored on Zytiga without any issues.   5.  Prognosis  and goals of care: Therapy remains palliative although aggressive measures are warranted.  6.  Weight gain: We have discussed strategies to improve nutritional intake and healthier eating.   7.  Follow-up: In 4 months for a repeat evaluation.   30  minutes were spent on this visit.  The time was dedicated to reviewing laboratory data, disease status update and outlining future plan of care reviewed.    Zola Button, MD 8/8/202310:16 AM

## 2022-04-16 NOTE — Telephone Encounter (Signed)
Contacted Patient left VM message regarding iron levels, prescription sent to pharmacy, and to call back with any questions or concerns.

## 2022-04-16 NOTE — Addendum Note (Signed)
Addended by: Wyatt Portela on: 04/16/2022 03:46 PM   Modules accepted: Orders

## 2022-04-17 LAB — PROSTATE-SPECIFIC AG, SERUM (LABCORP): Prostate Specific Ag, Serum: <0.1 ng/mL (ref 0.0–4.0)

## 2022-04-18 ENCOUNTER — Encounter: Payer: Self-pay | Admitting: Oncology

## 2022-04-18 NOTE — Telephone Encounter (Addendum)
Spoke with patient to relay message below.  ----- Message from Wyatt Portela, MD sent at 04/17/2022  3:38 PM EDT ----- Please let him know his PSA is low

## 2022-04-26 DIAGNOSIS — J069 Acute upper respiratory infection, unspecified: Secondary | ICD-10-CM | POA: Diagnosis not present

## 2022-04-26 DIAGNOSIS — R051 Acute cough: Secondary | ICD-10-CM | POA: Diagnosis not present

## 2022-04-26 DIAGNOSIS — Z1152 Encounter for screening for COVID-19: Secondary | ICD-10-CM | POA: Diagnosis not present

## 2022-04-26 DIAGNOSIS — J449 Chronic obstructive pulmonary disease, unspecified: Secondary | ICD-10-CM | POA: Diagnosis not present

## 2022-04-26 DIAGNOSIS — R0981 Nasal congestion: Secondary | ICD-10-CM | POA: Diagnosis not present

## 2022-04-26 DIAGNOSIS — I5032 Chronic diastolic (congestive) heart failure: Secondary | ICD-10-CM | POA: Diagnosis not present

## 2022-04-26 DIAGNOSIS — I130 Hypertensive heart and chronic kidney disease with heart failure and stage 1 through stage 4 chronic kidney disease, or unspecified chronic kidney disease: Secondary | ICD-10-CM | POA: Diagnosis not present

## 2022-04-26 DIAGNOSIS — N1831 Chronic kidney disease, stage 3a: Secondary | ICD-10-CM | POA: Diagnosis not present

## 2022-04-29 ENCOUNTER — Other Ambulatory Visit: Payer: Self-pay | Admitting: Oncology

## 2022-04-29 ENCOUNTER — Other Ambulatory Visit (HOSPITAL_COMMUNITY): Payer: Self-pay

## 2022-04-29 DIAGNOSIS — C61 Malignant neoplasm of prostate: Secondary | ICD-10-CM

## 2022-04-29 MED ORDER — ABIRATERONE ACETATE 250 MG PO TABS
1000.0000 mg | ORAL_TABLET | Freq: Every day | ORAL | 0 refills | Status: DC
  Filled 2022-04-29: qty 120, 30d supply, fill #0

## 2022-04-29 NOTE — Telephone Encounter (Signed)
Please change his entresto to 49/'51mg'$  po bid.  Send a new script if needed.  Ask him about his weight and see if its stable and if any shortness of breath.   Thanks  Dr. Terri Skains

## 2022-04-29 NOTE — Telephone Encounter (Signed)
Patient has been having to hold at least one Entresto dose often (previous instructions were to hold for SBP <110).   Average Systolic BP Level 444.58 mmHg Lowest Systolic BP Level 91 mmHg Highest Systolic BP Level 483 mmHg

## 2022-04-30 ENCOUNTER — Other Ambulatory Visit: Payer: Self-pay

## 2022-04-30 MED ORDER — SACUBITRIL-VALSARTAN 49-51 MG PO TABS: 1.0000 | ORAL_TABLET | Freq: Two times a day (BID) | ORAL | 5 refills | Status: DC

## 2022-04-30 NOTE — Telephone Encounter (Signed)
He can use torsemide on as needed basis.   Dr. Terri Skains

## 2022-04-30 NOTE — Progress Notes (Signed)
Dose reduction from 97/'103mg'$  due to soft BP

## 2022-05-08 ENCOUNTER — Other Ambulatory Visit (HOSPITAL_COMMUNITY): Payer: Self-pay

## 2022-05-15 ENCOUNTER — Other Ambulatory Visit: Payer: Self-pay | Admitting: Cardiology

## 2022-05-15 DIAGNOSIS — I5032 Chronic diastolic (congestive) heart failure: Secondary | ICD-10-CM | POA: Diagnosis not present

## 2022-05-20 DIAGNOSIS — M5416 Radiculopathy, lumbar region: Secondary | ICD-10-CM | POA: Diagnosis not present

## 2022-05-30 ENCOUNTER — Other Ambulatory Visit: Payer: Self-pay | Admitting: Oncology

## 2022-05-30 ENCOUNTER — Other Ambulatory Visit (HOSPITAL_COMMUNITY): Payer: Self-pay

## 2022-05-30 DIAGNOSIS — C61 Malignant neoplasm of prostate: Secondary | ICD-10-CM

## 2022-05-30 MED ORDER — ABIRATERONE ACETATE 250 MG PO TABS
1000.0000 mg | ORAL_TABLET | Freq: Every day | ORAL | 0 refills | Status: DC
  Filled 2022-05-30 – 2022-06-06 (×2): qty 120, 30d supply, fill #0

## 2022-06-03 ENCOUNTER — Other Ambulatory Visit (HOSPITAL_COMMUNITY): Payer: Self-pay

## 2022-06-06 ENCOUNTER — Telehealth: Payer: Self-pay

## 2022-06-06 ENCOUNTER — Other Ambulatory Visit: Payer: Self-pay | Admitting: Cardiology

## 2022-06-06 ENCOUNTER — Other Ambulatory Visit (HOSPITAL_COMMUNITY): Payer: Self-pay

## 2022-06-06 DIAGNOSIS — I5032 Chronic diastolic (congestive) heart failure: Secondary | ICD-10-CM

## 2022-06-06 DIAGNOSIS — R0609 Other forms of dyspnea: Secondary | ICD-10-CM

## 2022-06-06 NOTE — Telephone Encounter (Signed)
Patient has run out of funds on grants. Spoke with patient and have him waitlisted for when funds open. He is okay with paying the $100 co-pay for now.   Berdine Addison, [facility] [place]  [phone] (phone) [phone] (fax)

## 2022-06-10 DIAGNOSIS — M545 Low back pain, unspecified: Secondary | ICD-10-CM | POA: Diagnosis not present

## 2022-06-10 DIAGNOSIS — M5416 Radiculopathy, lumbar region: Secondary | ICD-10-CM | POA: Diagnosis not present

## 2022-06-10 DIAGNOSIS — Z6836 Body mass index (BMI) 36.0-36.9, adult: Secondary | ICD-10-CM | POA: Diagnosis not present

## 2022-06-14 DIAGNOSIS — I5032 Chronic diastolic (congestive) heart failure: Secondary | ICD-10-CM | POA: Diagnosis not present

## 2022-06-27 ENCOUNTER — Other Ambulatory Visit: Payer: Self-pay | Admitting: Oncology

## 2022-06-27 ENCOUNTER — Other Ambulatory Visit (HOSPITAL_COMMUNITY): Payer: Self-pay

## 2022-06-27 DIAGNOSIS — C61 Malignant neoplasm of prostate: Secondary | ICD-10-CM

## 2022-06-27 MED ORDER — ABIRATERONE ACETATE 250 MG PO TABS
1000.0000 mg | ORAL_TABLET | Freq: Every day | ORAL | 0 refills | Status: DC
  Filled 2022-06-27: qty 120, 30d supply, fill #0

## 2022-07-02 ENCOUNTER — Other Ambulatory Visit: Payer: Self-pay | Admitting: Cardiology

## 2022-07-02 DIAGNOSIS — I5032 Chronic diastolic (congestive) heart failure: Secondary | ICD-10-CM

## 2022-07-03 ENCOUNTER — Other Ambulatory Visit (HOSPITAL_COMMUNITY): Payer: Self-pay

## 2022-07-03 DIAGNOSIS — M5416 Radiculopathy, lumbar region: Secondary | ICD-10-CM | POA: Diagnosis not present

## 2022-07-10 DIAGNOSIS — J0100 Acute maxillary sinusitis, unspecified: Secondary | ICD-10-CM | POA: Diagnosis not present

## 2022-07-10 DIAGNOSIS — J449 Chronic obstructive pulmonary disease, unspecified: Secondary | ICD-10-CM | POA: Diagnosis not present

## 2022-07-10 DIAGNOSIS — R051 Acute cough: Secondary | ICD-10-CM | POA: Diagnosis not present

## 2022-07-10 DIAGNOSIS — I5032 Chronic diastolic (congestive) heart failure: Secondary | ICD-10-CM | POA: Diagnosis not present

## 2022-07-10 DIAGNOSIS — G4730 Sleep apnea, unspecified: Secondary | ICD-10-CM | POA: Diagnosis not present

## 2022-07-10 DIAGNOSIS — Z1152 Encounter for screening for COVID-19: Secondary | ICD-10-CM | POA: Diagnosis not present

## 2022-07-10 DIAGNOSIS — R5383 Other fatigue: Secondary | ICD-10-CM | POA: Diagnosis not present

## 2022-07-10 DIAGNOSIS — R0981 Nasal congestion: Secondary | ICD-10-CM | POA: Diagnosis not present

## 2022-07-11 ENCOUNTER — Other Ambulatory Visit (HOSPITAL_COMMUNITY): Payer: Self-pay

## 2022-07-11 ENCOUNTER — Telehealth: Payer: Self-pay | Admitting: Pharmacy Technician

## 2022-07-11 ENCOUNTER — Encounter: Payer: Self-pay | Admitting: Oncology

## 2022-07-11 NOTE — Telephone Encounter (Signed)
Oral Oncology Patient Advocate Encounter  Was successful in securing patient a $8,000 grant from [facility] to provide copayment coverage for Abiraterone.  This will keep the out of pocket expense at $0.     [employer] ID: [id number]  I have spoken with the patient.   The billing information is as follows and has been shared with [facility].    RxBin: [id number] PCN: [id number] Member ID: [id number] Group ID: [id number] Dates of Eligibility: 06/11/22 through 06/11/23  Fund:  Eunice, CPhT-[facility] [place] Direct Number: [phone]  Fax: [phone]

## 2022-07-15 DIAGNOSIS — I5032 Chronic diastolic (congestive) heart failure: Secondary | ICD-10-CM | POA: Diagnosis not present

## 2022-07-24 DIAGNOSIS — Z79891 Long term (current) use of opiate analgesic: Secondary | ICD-10-CM | POA: Diagnosis not present

## 2022-07-24 DIAGNOSIS — G894 Chronic pain syndrome: Secondary | ICD-10-CM | POA: Diagnosis not present

## 2022-07-24 DIAGNOSIS — M5416 Radiculopathy, lumbar region: Secondary | ICD-10-CM | POA: Diagnosis not present

## 2022-07-24 DIAGNOSIS — Z79899 Other long term (current) drug therapy: Secondary | ICD-10-CM | POA: Diagnosis not present

## 2022-07-26 ENCOUNTER — Other Ambulatory Visit (HOSPITAL_COMMUNITY): Payer: Self-pay

## 2022-07-26 ENCOUNTER — Other Ambulatory Visit: Payer: Self-pay | Admitting: Oncology

## 2022-07-26 DIAGNOSIS — C61 Malignant neoplasm of prostate: Secondary | ICD-10-CM

## 2022-07-26 MED ORDER — ABIRATERONE ACETATE 250 MG PO TABS
1000.0000 mg | ORAL_TABLET | Freq: Every day | ORAL | 0 refills | Status: DC
  Filled 2022-07-26: qty 120, 30d supply, fill #0

## 2022-07-29 ENCOUNTER — Other Ambulatory Visit (HOSPITAL_COMMUNITY): Payer: Self-pay

## 2022-07-29 DIAGNOSIS — E785 Hyperlipidemia, unspecified: Secondary | ICD-10-CM | POA: Diagnosis not present

## 2022-07-29 DIAGNOSIS — C61 Malignant neoplasm of prostate: Secondary | ICD-10-CM | POA: Diagnosis not present

## 2022-07-29 DIAGNOSIS — J449 Chronic obstructive pulmonary disease, unspecified: Secondary | ICD-10-CM | POA: Diagnosis not present

## 2022-07-29 DIAGNOSIS — N1831 Chronic kidney disease, stage 3a: Secondary | ICD-10-CM | POA: Diagnosis not present

## 2022-07-29 DIAGNOSIS — D500 Iron deficiency anemia secondary to blood loss (chronic): Secondary | ICD-10-CM | POA: Diagnosis not present

## 2022-07-29 DIAGNOSIS — R7303 Prediabetes: Secondary | ICD-10-CM | POA: Diagnosis not present

## 2022-07-29 DIAGNOSIS — I130 Hypertensive heart and chronic kidney disease with heart failure and stage 1 through stage 4 chronic kidney disease, or unspecified chronic kidney disease: Secondary | ICD-10-CM | POA: Diagnosis not present

## 2022-07-29 DIAGNOSIS — I5033 Acute on chronic diastolic (congestive) heart failure: Secondary | ICD-10-CM | POA: Diagnosis not present

## 2022-07-29 DIAGNOSIS — I700 Atherosclerosis of aorta: Secondary | ICD-10-CM | POA: Diagnosis not present

## 2022-07-31 ENCOUNTER — Other Ambulatory Visit (HOSPITAL_COMMUNITY): Payer: Self-pay

## 2022-08-13 ENCOUNTER — Telehealth: Payer: Self-pay | Admitting: Oncology

## 2022-08-13 NOTE — Telephone Encounter (Signed)
Called patient regarding upcoming December appointments, left a voicemail. 

## 2022-08-14 ENCOUNTER — Telehealth: Payer: Self-pay

## 2022-08-15 ENCOUNTER — Ambulatory Visit: Payer: Medicare PPO | Admitting: Cardiology

## 2022-08-15 ENCOUNTER — Encounter: Payer: Self-pay | Admitting: Cardiology

## 2022-08-15 ENCOUNTER — Ambulatory Visit: Payer: Medicare PPO

## 2022-08-15 ENCOUNTER — Other Ambulatory Visit: Payer: Medicare PPO

## 2022-08-15 ENCOUNTER — Ambulatory Visit: Payer: Medicare PPO | Admitting: Oncology

## 2022-08-15 VITALS — BP 136/77 | HR 94 | Resp 18 | Ht 66.5 in | Wt 233.6 lb

## 2022-08-15 DIAGNOSIS — Z6837 Body mass index (BMI) 37.0-37.9, adult: Secondary | ICD-10-CM | POA: Diagnosis not present

## 2022-08-15 DIAGNOSIS — I10 Essential (primary) hypertension: Secondary | ICD-10-CM | POA: Diagnosis not present

## 2022-08-15 DIAGNOSIS — E782 Mixed hyperlipidemia: Secondary | ICD-10-CM

## 2022-08-15 DIAGNOSIS — I5032 Chronic diastolic (congestive) heart failure: Secondary | ICD-10-CM

## 2022-08-15 DIAGNOSIS — Z87891 Personal history of nicotine dependence: Secondary | ICD-10-CM | POA: Diagnosis not present

## 2022-08-15 DIAGNOSIS — I2510 Atherosclerotic heart disease of native coronary artery without angina pectoris: Secondary | ICD-10-CM | POA: Diagnosis not present

## 2022-08-15 NOTE — Progress Notes (Signed)
ID:  Bobby Forster., DOB 1951/06/23, MRN [id number]  PCP:  [name], MD  Cardiologist: Rex Kras, DO, [facility] (established care 01/21/2020)  Date: 08/15/22 Last Office Visit: 03/28/2022  Chief Complaint  Patient presents with   Hypotension   Shortness of Breath    Bobby Fiumara. is a 71 y.o. male whose past medical history and cardiovascular risk factors are: Nonobstructive coronary artery disease, severe coronary artery calcification 1101 AU, chronic HFpEF/stage C/NYHA class II, COVID infection (08/2021), prediabetes, hyperlipidemia, hypertension, advanced age, former smoker, obesity due to excess calories.  Patient is accompanied by his wife Bobby [name] at today's visit.  Initially referred to the practice for evaluation and management of coronary artery calcification.  Since then he has been diagnosed with nonobstructive CAD and HFpEF and has been on directed medical therapy.  His overall volume status has improved and with compliance of medication he has avoided hospitalization for cardiovascular reasons since August 2021.  Patient presents today for an acute visit given concerns for hypotension, feeling tired/fatigued, requiring frequent naps, decreased appetite, and back pain.   Patient is currently enrolled into remote patient monitoring and ambulatory blood pressure data reviewed.  Patient's blood pressure not consistent with hypotension.  His orthostatic vital signs are also negative.  Patient's wife informs me today that he has been increasing his pain medications due to ongoing back pain which has led to the increase consumption of the Lyrica and tramadol.  He has an upcoming appointment with oncology next week given his history of prostate cancer.  FUNCTIONAL STATUS: Walking more with grandkids, as per patient's wife.  ALLERGIES: No Known Allergies  MEDICATION LIST PRIOR TO VISIT: Current Meds  Medication Sig   abiraterone acetate (ZYTIGA) 250 MG tablet  Take 4 tablets (1,000 mg total) by mouth daily. Take on an empty stomach 1 hour before or 2 hours after a meal   aspirin EC 81 MG tablet Take 1 tablet (81 mg total) by mouth daily.   atorvastatin (LIPITOR) 40 MG tablet TAKE ONE TABLET BY MOUTH (40 MG total) DAILY   diltiazem (CARDIZEM CD) 360 MG 24 hr capsule TAKE ONE CAPSULE BY MOUTH ONCE DAILY   empagliflozin (JARDIANCE) 10 MG TABS tablet Take 1 tablet (10 mg total) by mouth daily before breakfast.   lidocaine (LIDODERM) 5 % Place 1 patch onto the skin daily.   predniSONE (DELTASONE) 5 MG tablet Take 5 mg by mouth daily with breakfast.   pregabalin (LYRICA) 75 MG capsule Take 75 mg by mouth 2 (two) times daily.   sacubitril-valsartan (ENTRESTO) 49-51 MG Take 1 tablet by mouth 2 (two) times daily.   solifenacin (VESICARE) 5 MG tablet Take 5 mg by mouth daily.   tamsulosin (FLOMAX) 0.4 MG CAPS capsule Take 1 capsule (0.4 mg total) by mouth daily after supper. (Patient taking differently: Take 0.4 mg by mouth at bedtime.)   torsemide (DEMADEX) 20 MG tablet TAKE 1/2 TABLET BY MOUTH DAILY (Patient taking differently: Take 10 mg by mouth daily as needed (weight gain.).)   traMADol (ULTRAM) 50 MG tablet Take 50 mg by mouth 2 (two) times daily as needed for moderate pain.      PAST MEDICAL HISTORY: Past Medical History:  Diagnosis Date   Bronchitis    CHF (congestive heart failure) ([place]) 04/2020   A/C HFpEF   Coronary artery calcification of native artery    Family history of pancreatic cancer    History of kidney stones    Hyperlipidemia  Hypertension    Pneumonia    Prostate cancer [name])     PAST SURGICAL HISTORY: Past Surgical History:  Procedure Laterality Date   EXTRACORPOREAL SHOCK WAVE LITHOTRIPSY Right 04/17/2017   Procedure: RIGHT EXTRACORPOREAL SHOCK WAVE LITHOTRIPSY (ESWL);  Surgeon: Kathie Rhodes, MD;  Location: [facility];  Service: Urology;  Laterality: Right;   HERNIA REPAIR     age 19   LEFT HEART CATH AND CORONARY  ANGIOGRAPHY N/A 05/08/2020   Procedure: LEFT HEART CATH AND CORONARY ANGIOGRAPHY;  Surgeon: Adrian Prows, MD;  Location: [place] [facility];  Service: Cardiovascular;  Laterality: N/A;   PROSTATE BIOPSY      FAMILY HISTORY: The patient family history includes Breast cancer in his maternal aunt; Breast cancer (age of onset: 7) in his sister; Kidney cancer in his maternal aunt; Pancreatic cancer (age of onset: 22) in his brother.  SOCIAL HISTORY:  The patient  reports that he quit smoking about 10 years ago. His smoking use included cigarettes. He has a 2.50 pack-year smoking history. He has never used smokeless tobacco. He reports current alcohol use. He reports that he does not use drugs.  REVIEW OF SYSTEMS: Review of Systems  Constitutional: Positive for decreased appetite, malaise/fatigue and weight loss. Negative for chills, fever and weight gain.  HENT:  Negative for hoarse voice and nosebleeds.   Eyes:  Negative for discharge, double vision and pain.  Cardiovascular:  Positive for dyspnea on exertion (stable). Negative for chest pain, claudication, leg swelling, near-syncope, orthopnea, palpitations, paroxysmal nocturnal dyspnea and syncope.  Respiratory:  Positive for shortness of breath (stable). Negative for hemoptysis.   Musculoskeletal:  Negative for muscle cramps and myalgias.  Gastrointestinal:  Negative for abdominal pain, constipation, diarrhea, hematemesis, hematochezia, melena, nausea and vomiting.  Neurological:  Negative for dizziness and light-headedness.    PHYSICAL EXAM:    08/15/2022    3:08 PM 04/16/2022   10:36 AM 03/28/2022    3:19 PM  Vitals with BMI  Height 5' 6.5" 5' 6.5" 5' 6.5"  Weight 233 lbs 10 oz 240 lbs 5 oz 241 lbs  BMI 37.14 76.16 07.37  Systolic 106 269 485  Diastolic 77 58 57  Pulse 94 81 75   Orthostatic VS for the past 72 hrs (Last 3 readings):  Orthostatic BP Patient Position BP Location Cuff Size Orthostatic Pulse  08/15/22 1526 114/68  Standing Left Arm Large 88  08/15/22 1525 109/60 Sitting Left Arm Large 96  08/15/22 1524 108/63 Supine Left Arm Large 96   CONSTITUTIONAL: Well-developed and well-nourished. No acute distress.  SKIN: Skin is warm and dry. No rash noted. No cyanosis. No pallor. No jaundice HEAD: Normocephalic and atraumatic.  EYES: No scleral icterus MOUTH/THROAT: Moist oral membranes.  NECK: No JVD present. No thyromegaly noted. No carotid bruits  CHEST Normal respiratory effort. No intercostal retractions  LUNGS: Clear to auscultation bilaterally. No stridor. No wheezes. No rales.  CARDIOVASCULAR: Regular rate and rhythm, positive S1-S2, no murmurs rubs or gallops appreciated. ABDOMINAL: Obese, soft, nontender, nondistended, positive bowel sounds all 4 quadrants. No apparent ascites.  EXTREMITIES: +1 bilateral pitting peripheral edema, warm to touch,   HEMATOLOGIC: No significant bruising NEUROLOGIC: Oriented to person, place, and time. Nonfocal. Normal muscle tone.  PSYCHIATRIC: Normal mood and affect. Normal behavior. Cooperative  RADIOLOGY: CT angio chest PE with and without contrast September 02, 2019:Cardiovascular: There is mild cardiomegaly. No pericardial effusion. There is multi vessel coronary vascular calcification. There is mild atherosclerotic calcification of the thoracic aorta.  CARDIAC DATABASE: EKG: 09/27/2021: NSR, 81bpm, first degree AV Block, diffuse nonspecific T wave abnormalities.  03/28/2022: Normal sinus rhythm, 69bpm, first-degree AV block, without underlying injury pattern. 08/15/2022: Normal sinus rhythm, 90 bpm, low voltage in the precordial leads, nonspecific T wave abnormality  Echocardiogram: 05/06/2020: LVEF 60-65%, hyperdynamic LVEF, moderate LVH, grade 2 diastolic impairment, elevated LVEDP, RV systolic function and size are within normal limits, trivial TR, trivial MR, mild to moderate aortic valve sclerosis without stenosis.  Stress Testing: Lexiscan/modified Bruce  Tetrofosmin stress test 02/23/2020: Stress EKG showed sinus tachycardia, inferolateral T wave inversion.  SPECT images show small sized, medium intensity, mid to basal inferolateral perfusion defect with mild reversibility. Stress LVEF 82%. Low risk study.  Coronary CTA 04/26/2020: 1. Coronary calcium score of 1101. This was 89th percentile for age and sex matched control. 2. Normal coronary origin with right dominance. 3.  Minimal calcified plaque within the left main artery. Moderate to severe stenosis within the proximal/mid LAD segment to calcified plaque. Severe stenosis at the ostial segment of the 1st diagonal branch due calcified plaque. Severe stenosis within the proximal segment due to eccentric calcified plaque within the LCX. Moderate stenosis within the distal RCA due to calcified plaque. Of note, severity of the stenosis may be overestimated due to blooming artifact caused by calcified plaque. 4. CADRADS = 4. Cardiac catheterization or CT FFR is recommended. Consider symptom-guided anti-ischemic pharmacotherapy as well as risk factor modification per guideline directed care. Invasive coronary angiography recommended with revascularization per published guideline statements. 4. Study is sent for CT-FFR findings will be performed and reported   CT-FFR 04/27/2020: CT FFR analysis showed no significant stenosis.  Heart Catheterization: Left Heart Catheterization 05/08/20:  Hyperdynamic LVEF, EF 65 to 70%.  Mildly elevated LV EDP. Mild diffuse coronary artery disease with mild coronary calcification involving LAD, circumflex and dominant RCA.  LAD is very small and gives origin to a very large LAD: D1 which reaches the apex. Recommendation: Patient symptoms are related to acute diastolic heart failure with elevated LVEDP.  LABORATORY DATA:    Latest Ref Rng & Units 04/16/2022   10:09 AM 12/23/2021    3:12 AM 12/22/2021    3:32 AM  CBC  WBC 4.0 - 10.5 K/uL 6.7  8.2  13.4    Hemoglobin 13.0 - 17.0 g/dL 8.5  11.9  11.7   Hematocrit 39.0 - 52.0 % 27.2  38.0  37.5   Platelets 150 - 400 K/uL 303  285  246        Latest Ref Rng & Units 04/16/2022   10:09 AM 04/01/2022    8:30 AM 12/21/2021    3:46 AM  CMP  Glucose 70 - 99 mg/dL 121  103  99   BUN 8 - 23 mg/dL '19  24  18   '$ Creatinine 0.61 - 1.24 mg/dL 1.03  0.97  1.10   Sodium 135 - 145 mmol/L 140  143  135   Potassium 3.5 - 5.1 mmol/L 3.7  4.7  3.7   Chloride 98 - 111 mmol/L 108  108  106   CO2 22 - 32 mmol/L '27  23  20   '$ Calcium 8.9 - 10.3 mg/dL 8.9  9.3  8.6   Total Protein 6.5 - 8.1 g/dL 6.2     Total Bilirubin 0.3 - 1.2 mg/dL 0.5     Alkaline Phos 38 - 126 U/L 83     AST 15 - 41 U/L 9     ALT 0 -  44 U/L 10      Hepatic Function Panel Recent Labs    11/07/21 0854 12/20/21 1228 04/16/22 1009  PROT 6.3* 7.3 6.2*  ALBUMIN 3.8 4.0 3.9  AST 10* 14* 9*  ALT '10 11 10  '$ ALKPHOS 89 86 83  BILITOT 0.4 1.2 0.5   Lab Results  Component Value Date   CHOL 122 03/16/2020   HDL 45 03/16/2020   LDLCALC 55 03/16/2020   TRIG 123 03/16/2020   External Labs: Lipid Panel 12/30/2019: total 155, Triglycerides 167, HDL 44, LDL 78  12/30/2019: A1C 5.4% Glucose Random 115 BUN 14, Creatinine, Serum 0.9  03/19/2019: PSA normal   06/05/2018: TSH 1.000  IMPRESSION:    ICD-10-CM   1. Chronic heart failure with preserved ejection fraction (HFpEF) (HCC)  I50.32 EKG 12-Lead    Lipid Panel With LDL/HDL Ratio    LDL cholesterol, direct    Comp. Metabolic Panel (12)    PCV ECHOCARDIOGRAM COMPLETE    2. Nonobstructive atherosclerosis of coronary artery  I25.10 Lipid Panel With LDL/HDL Ratio    LDL cholesterol, direct    Comp. Metabolic Panel (12)    PCV ECHOCARDIOGRAM COMPLETE    3. Mixed hyperlipidemia  E78.2 Lipid Panel With LDL/HDL Ratio    LDL cholesterol, direct    Comp. Metabolic Panel (12)    4. Benign hypertension  I10     5. Former smoker  Z87.891     30. Class 2 severe obesity due to excess  calories with serious comorbidity and body mass index (BMI) of 37.0 to 37.9 in adult [facility])  E66.01    Z68.37        RECOMMENDATIONS: Eulon Allnutt. is a 71 y.o. male whose past medical history and cardiac risk factors include: Nonobstructive coronary artery disease, severe coronary artery calcification 1101 AU, chronic HFpEF/stage C/NYHA class II, prediabetes, hyperlipidemia, hypertension, advanced age, former smoker, obesity due to excess calories.  Chronic heart failure with preserved ejection fraction (HFpEF) (HCC) Clinically appears to be euvolemic and not in overt heart failure. Orthostatic vital signs are negative Ambulatory blood pressure monitoring results reviewed and within acceptable limits.  I suspect that his soft blood pressures are secondary to increased analgesic medications. Patient and his wife are advised to hold Entresto if the systolic blood pressure less than 110 mmHg. If he does hold Entresto frequently due to soft blood pressures at home we will reduce the Entresto to 24/26 mg p.o. twice daily Of note he takes torsemide on as needed basis. Patient is lost 8 pounds since last office visit, July 2023. Will recheck labs and echocardiogram given the change in clinical status. Strict I's and O's and daily weights Reemphasized the importance of low-salt diet.  Nonobstructive atherosclerosis of coronary artery Continue aspirin and statin therapy. No angina pectoris since last office visit. Medications reconciled and recommendations noted above Educated importance of improving his modifiable cardiovascular risk factors.  Mixed hyperlipidemia Currently on atorvastatin.   He denies myalgia or other side effects. Recommend a goal LDL of less than 70 mg/dL. Will check a fasting lipid profile, direct LDL, and CMP  Benign hypertension Office blood pressures are well controlled. Ambulatory blood pressure readings reviewed. Recommendations noted above. Emphasized  importance of low-salt diet and to be cognizant of too much analgesic medications. Continue current medical therapy  Obstructive sleep apnea on CPAP: Patient is compliant with his CPAP usage and reinforced the importance.   FINAL MEDICATION LIST END OF ENCOUNTER: No orders of the defined  types were placed in this encounter.    Current Outpatient Medications:    abiraterone acetate (ZYTIGA) 250 MG tablet, Take 4 tablets (1,000 mg total) by mouth daily. Take on an empty stomach 1 hour before or 2 hours after a meal, Disp: 120 tablet, Rfl: 0   aspirin EC 81 MG tablet, Take 1 tablet (81 mg total) by mouth daily., Disp: 90 tablet, Rfl: 3   atorvastatin (LIPITOR) 40 MG tablet, TAKE ONE TABLET BY MOUTH (40 MG total) DAILY, Disp: 90 tablet, Rfl: PRN   diltiazem (CARDIZEM CD) 360 MG 24 hr capsule, TAKE ONE CAPSULE BY MOUTH ONCE DAILY, Disp: 90 capsule, Rfl: 0   empagliflozin (JARDIANCE) 10 MG TABS tablet, Take 1 tablet (10 mg total) by mouth daily before breakfast., Disp: 90 tablet, Rfl: 3   lidocaine (LIDODERM) 5 %, Place 1 patch onto the skin daily., Disp: , Rfl:    predniSONE (DELTASONE) 5 MG tablet, Take 5 mg by mouth daily with breakfast., Disp: , Rfl:    pregabalin (LYRICA) 75 MG capsule, Take 75 mg by mouth 2 (two) times daily., Disp: , Rfl:    sacubitril-valsartan (ENTRESTO) 49-51 MG, Take 1 tablet by mouth 2 (two) times daily., Disp: 60 tablet, Rfl: 5   solifenacin (VESICARE) 5 MG tablet, Take 5 mg by mouth daily., Disp: , Rfl:    tamsulosin (FLOMAX) 0.4 MG CAPS capsule, Take 1 capsule (0.4 mg total) by mouth daily after supper. (Patient taking differently: Take 0.4 mg by mouth at bedtime.), Disp: 30 capsule, Rfl: 0   torsemide (DEMADEX) 20 MG tablet, TAKE 1/2 TABLET BY MOUTH DAILY (Patient taking differently: Take 10 mg by mouth daily as needed (weight gain.).), Disp: 30 tablet, Rfl: 2   traMADol (ULTRAM) 50 MG tablet, Take 50 mg by mouth 2 (two) times daily as needed for moderate pain. ,  Disp: , Rfl:   Orders Placed This Encounter  Procedures   Lipid Panel With LDL/HDL Ratio   LDL cholesterol, direct   Comp. Metabolic Panel (12)   EKG 12-Lead   PCV ECHOCARDIOGRAM COMPLETE    --Continue cardiac medications as reconciled in final medication list. --Return in about 6 months (around 02/14/2023) for Follow up, heart failure management.. Or sooner if needed. --Continue follow-up with your primary care physician regarding the management of your other chronic comorbid conditions.  Patient's questions and concerns were addressed to his satisfaction. He voices understanding of the instructions provided during this encounter.   This note was created using a voice recognition software as a result there may be grammatical errors inadvertently enclosed that do not reflect the nature of this encounter. Every attempt is made to correct such errors.  Rex Kras, [place], [facility]  Pager: [phone] Office: [phone]

## 2022-08-22 ENCOUNTER — Inpatient Hospital Stay: Payer: Medicare PPO

## 2022-08-22 ENCOUNTER — Inpatient Hospital Stay: Payer: Medicare PPO | Attending: Oncology

## 2022-08-22 ENCOUNTER — Telehealth: Payer: Self-pay | Admitting: Oncology

## 2022-08-22 ENCOUNTER — Inpatient Hospital Stay: Payer: Medicare PPO | Admitting: Oncology

## 2022-08-22 ENCOUNTER — Other Ambulatory Visit: Payer: Self-pay

## 2022-08-22 VITALS — BP 130/61 | HR 77 | Temp 97.8°F | Resp 18 | Ht 66.5 in | Wt 238.5 lb

## 2022-08-22 DIAGNOSIS — C61 Malignant neoplasm of prostate: Secondary | ICD-10-CM

## 2022-08-22 DIAGNOSIS — D509 Iron deficiency anemia, unspecified: Secondary | ICD-10-CM

## 2022-08-22 DIAGNOSIS — Z5111 Encounter for antineoplastic chemotherapy: Secondary | ICD-10-CM | POA: Diagnosis not present

## 2022-08-22 LAB — CBC WITH DIFFERENTIAL (CANCER CENTER ONLY)
Abs Immature Granulocytes: 0.06 10*3/uL (ref 0.00–0.07)
Basophils Absolute: 0.1 10*3/uL (ref 0.0–0.1)
Basophils Relative: 1 %
Eosinophils Absolute: 0.2 10*3/uL (ref 0.0–0.5)
Eosinophils Relative: 2 %
HCT: 35.6 % — ABNORMAL LOW (ref 39.0–52.0)
Hemoglobin: 10.7 g/dL — ABNORMAL LOW (ref 13.0–17.0)
Immature Granulocytes: 1 %
Lymphocytes Relative: 7 %
Lymphs Abs: 0.8 10*3/uL (ref 0.7–4.0)
MCH: 20.7 pg — ABNORMAL LOW (ref 26.0–34.0)
MCHC: 30.1 g/dL (ref 30.0–36.0)
MCV: 68.9 fL — ABNORMAL LOW (ref 80.0–100.0)
Monocytes Absolute: 0.4 10*3/uL (ref 0.1–1.0)
Monocytes Relative: 4 %
Neutro Abs: 8.9 10*3/uL — ABNORMAL HIGH (ref 1.7–7.7)
Neutrophils Relative %: 85 %
Platelet Count: 385 10*3/uL (ref 150–400)
RBC: 5.17 MIL/uL (ref 4.22–5.81)
RDW: 21.2 % — ABNORMAL HIGH (ref 11.5–15.5)
WBC Count: 10.4 10*3/uL (ref 4.0–10.5)
nRBC: 0 % (ref 0.0–0.2)

## 2022-08-22 LAB — CMP (CANCER CENTER ONLY)
ALT: 9 U/L (ref 0–44)
AST: 10 U/L — ABNORMAL LOW (ref 15–41)
Albumin: 3.9 g/dL (ref 3.5–5.0)
Alkaline Phosphatase: 106 U/L (ref 38–126)
Anion gap: 6 (ref 5–15)
BUN: 17 mg/dL (ref 8–23)
CO2: 28 mmol/L (ref 22–32)
Calcium: 9.5 mg/dL (ref 8.9–10.3)
Chloride: 108 mmol/L (ref 98–111)
Creatinine: 0.88 mg/dL (ref 0.61–1.24)
GFR, Estimated: >60 mL/min (ref >60)
Glucose, Bld: 148 mg/dL — ABNORMAL HIGH (ref 70–99)
Potassium: 4 mmol/L (ref 3.5–5.1)
Sodium: 142 mmol/L (ref 135–145)
Total Bilirubin: 0.4 mg/dL (ref 0.3–1.2)
Total Protein: 6.1 g/dL — ABNORMAL LOW (ref 6.5–8.1)

## 2022-08-22 LAB — IRON AND IRON BINDING CAPACITY (CC-WL,HP ONLY)
Iron: 20 ug/dL — ABNORMAL LOW (ref 45–182)
Saturation Ratios: 5 % — ABNORMAL LOW (ref 17.9–39.5)
TIBC: 395 ug/dL (ref 250–450)
UIBC: 375 ug/dL (ref 117–376)

## 2022-08-22 LAB — FERRITIN: Ferritin: 8 ng/mL — ABNORMAL LOW (ref 24–336)

## 2022-08-22 MED ORDER — LEUPROLIDE ACETATE (4 MONTH) 30 MG ~~LOC~~ KIT
30.0000 mg | PACK | Freq: Once | SUBCUTANEOUS | Status: AC
  Administered 2022-08-22: 30 mg via SUBCUTANEOUS
  Filled 2022-08-22: qty 30

## 2022-08-22 NOTE — Telephone Encounter (Signed)
Spoke with patient after his MD visit with Dr. Alen Blew. Sent staff message to DB scheduler for Dr. Benay Spice to coordinate f/u in 4 months with labs.

## 2022-08-22 NOTE — Progress Notes (Signed)
Hematology and Oncology Follow Up Visit  Bobby [name] [id number] [birth year] 71 y.o. 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  He presented with lymphadenopathy, Gleason score 4+5 = 9 and PSA of 14.7 at the time of diagnosis.   Prior Therapy: He is status post prostate biopsy in August 2018.  Current therapy:  Eligard 30 mg every 4 months.  He will receive injection today and repeated in 4 months.  Zytiga 1000 mg daily with prednisone 5 mg daily started in October 2018.  Interim History: Bobby [name] returns today for a follow-up visit.  Since last visit, he reports that no major changes in his health.  He continues to have issues with chronic back pain although manageable at this time.  He had imaging studies including MRI in the past transabdominally spinal stenosis.  He denies any complications related to Zytiga.  He denies excessive fatigue, weakness.  He denies any bone pain or pathological fractures.     Medications: Updated on review.  Current Outpatient Medications  Medication Sig Dispense Refill   abiraterone acetate (ZYTIGA) 250 MG tablet Take 4 tablets (1,000 mg total) by mouth daily. Take on an empty stomach 1 hour before or 2 hours after a meal 120 tablet 0   aspirin EC 81 MG tablet Take 1 tablet (81 mg total) by mouth daily. 90 tablet 3   atorvastatin (LIPITOR) 40 MG tablet TAKE ONE TABLET BY MOUTH (40 MG total) DAILY 90 tablet PRN   diltiazem (CARDIZEM CD) 360 MG 24 hr capsule TAKE ONE CAPSULE BY MOUTH ONCE DAILY 90 capsule 0   empagliflozin (JARDIANCE) 10 MG TABS tablet Take 1 tablet (10 mg total) by mouth daily before breakfast. 90 tablet 3   lidocaine (LIDODERM) 5 % Place 1 patch onto the skin daily.     predniSONE (DELTASONE) 5 MG tablet Take 5 mg by mouth daily with breakfast.     pregabalin (LYRICA) 75 MG capsule  Take 75 mg by mouth 2 (two) times daily.     sacubitril-valsartan (ENTRESTO) 49-51 MG Take 1 tablet by mouth 2 (two) times daily. 60 tablet 5   solifenacin (VESICARE) 5 MG tablet Take 5 mg by mouth daily.     tamsulosin (FLOMAX) 0.4 MG CAPS capsule Take 1 capsule (0.4 mg total) by mouth daily after supper. (Patient taking differently: Take 0.4 mg by mouth at bedtime.) 30 capsule 0   torsemide (DEMADEX) 20 MG tablet TAKE 1/2 TABLET BY MOUTH DAILY (Patient taking differently: Take 10 mg by mouth daily as needed (weight gain.).) 30 tablet 2   traMADol (ULTRAM) 50 MG tablet Take 50 mg by mouth 2 (two) times daily as needed for moderate pain.      No current facility-administered medications for this visit.     Allergies: No Known Allergies     Physical Exam:       Blood pressure 130/61, pulse 77, temperature 97.8 F (36.6 C), temperature source Temporal, resp. rate 18, height 5' 6.5" (1.689 m), weight 238 lb 8 oz (108.2 kg), SpO2 100 %.      ECOG: 1     General appearance: Comfortable appearing without any discomfort Head: Normocephalic without any trauma Oropharynx: Mucous membranes are moist and pink without any thrush or ulcers. Eyes: Pupils are equal and round reactive to light. Lymph nodes: No cervical, supraclavicular, inguinal or axillary lymphadenopathy.   Heart:regular rate and rhythm.  S1 and S2 without leg edema. Lung: Clear without any rhonchi or wheezes.  No dullness to percussion. Abdomin: Soft, nontender, nondistended with good bowel sounds.  No hepatosplenomegaly. Musculoskeletal: No joint deformity or effusion.  Full range of motion noted. Neurological: No deficits noted on motor, sensory and deep tendon reflex exam. Skin: No petechial rash or dryness.  Appeared moist.                 Lab Results: Lab Results  Component Value Date   WBC 6.7 04/16/2022   HGB 8.5 (L) 04/16/2022   HCT 27.2 (L) 04/16/2022   MCV 75.6 (L) 04/16/2022   PLT 303  04/16/2022     Chemistry      Component Value Date/Time   NA 140 04/16/2022 1009   NA 143 04/01/2022 0830   NA 142 09/10/2017 1509   K 3.7 04/16/2022 1009   K 4.2 09/10/2017 1509   CL 108 04/16/2022 1009   CO2 27 04/16/2022 1009   CO2 25 09/10/2017 1509   BUN 19 04/16/2022 1009   BUN 24 04/01/2022 0830   BUN 19.4 09/10/2017 1509   CREATININE 1.03 04/16/2022 1009   CREATININE 1.2 09/10/2017 1509      Component Value Date/Time   CALCIUM 8.9 04/16/2022 1009   CALCIUM 9.7 09/10/2017 1509   ALKPHOS 83 04/16/2022 1009   ALKPHOS 84 09/10/2017 1509   AST 9 (L) 04/16/2022 1009   AST 14 09/10/2017 1509   ALT 10 04/16/2022 1009   ALT 18 09/10/2017 1509   BILITOT 0.5 04/16/2022 1009   BILITOT 0.38 09/10/2017 1509            Latest Reference Range & Units 11/07/21 08:54 04/16/22 10:09  Prostate Specific Ag, Serum 0.0 - 4.0 ng/mL <0.1 <0.1          Impression and Plan:  71 year old man with:   1.  Advanced prostate cancer with lymphadenopathy diagnosed in 2018.  He has castration-sensitive disease.  The natural course of his disease was reviewed and treatment choices were discussed.  His PSA continues to be undetectable without any major complications.  Risks and benefits of continuing this approach were discussed.  Alternative treatment options including PARP inhibitor, Taxotere chemotherapy among others will be deferred unless he has castration-resistant disease.  He is agreeable to continue at this time.    2.  Androgen deprivation therapy: He will receive Eligard today and repeated in 4 months.  This will be continued indefinitely.  Complications including weight gain, hot flashes and sexual dysfunction were discussed.   3.  Iron deficiency anemia diagnosed in August 2023.  He was found to have a hemoglobin of 8.5 with iron of 23 and saturation of 6%.  Ferritin was 5.  His hemoglobin continues to improve with iron supplement up to 10.7.  4.  Electrolyte and liver  function test monitoring: No abnormalities detected at this time.  This will continue be monitored on Zytiga.  Hypokalemia, elevated AST and ALT have been noted in the past.   5.  Prognosis and goals of care: His disease is incurable although aggressive measures are warranted given his excellent performance status.  6.  Weight gain: Continue discussed her allergies to prove his nutritional intake and strategies to lose weight.   7.  Follow-up: He will return in 4 months for repeat follow-up.   30  minutes were dedicated to this visit.  The time was spent on updating his disease status, treatment choices and outlining future  plan of care review.    Zola Button, MD 12/14/20231:50 PM

## 2022-08-23 ENCOUNTER — Other Ambulatory Visit (HOSPITAL_COMMUNITY): Payer: Self-pay

## 2022-08-23 ENCOUNTER — Other Ambulatory Visit: Payer: Self-pay

## 2022-08-23 ENCOUNTER — Other Ambulatory Visit: Payer: Self-pay | Admitting: Oncology

## 2022-08-23 DIAGNOSIS — C61 Malignant neoplasm of prostate: Secondary | ICD-10-CM

## 2022-08-23 MED ORDER — ABIRATERONE ACETATE 250 MG PO TABS
1000.0000 mg | ORAL_TABLET | Freq: Every day | ORAL | 0 refills | Status: DC
  Filled 2022-08-23: qty 120, 30d supply, fill #0

## 2022-08-24 LAB — PROSTATE-SPECIFIC AG, SERUM (LABCORP): Prostate Specific Ag, Serum: <0.1 ng/mL (ref 0.0–4.0)

## 2022-08-26 ENCOUNTER — Telehealth: Payer: Self-pay | Admitting: *Deleted

## 2022-08-26 ENCOUNTER — Other Ambulatory Visit (HOSPITAL_COMMUNITY): Payer: Self-pay

## 2022-08-26 NOTE — Telephone Encounter (Signed)
LM with note below 

## 2022-08-26 NOTE — Telephone Encounter (Signed)
-----   Message from Wyatt Portela, MD sent at 08/26/2022  8:40 AM EST ----- Please let him know his PSA is still low

## 2022-08-27 ENCOUNTER — Other Ambulatory Visit (HOSPITAL_COMMUNITY): Payer: Self-pay

## 2022-08-27 ENCOUNTER — Other Ambulatory Visit: Payer: Self-pay

## 2022-08-30 DIAGNOSIS — I5032 Chronic diastolic (congestive) heart failure: Secondary | ICD-10-CM | POA: Diagnosis not present

## 2022-08-30 DIAGNOSIS — E782 Mixed hyperlipidemia: Secondary | ICD-10-CM | POA: Diagnosis not present

## 2022-08-30 DIAGNOSIS — I2510 Atherosclerotic heart disease of native coronary artery without angina pectoris: Secondary | ICD-10-CM | POA: Diagnosis not present

## 2022-08-31 LAB — LIPID PANEL WITH LDL/HDL RATIO
Cholesterol, Total: 122 mg/dL (ref 100–199)
HDL: 63 mg/dL (ref >39)
LDL Chol Calc (NIH): 41 mg/dL (ref 0–99)
LDL/HDL Ratio: 0.7 ratio (ref 0.0–3.6)
Triglycerides: 99 mg/dL (ref 0–149)
VLDL Cholesterol Cal: 18 mg/dL (ref 5–40)

## 2022-08-31 LAB — COMP. METABOLIC PANEL (12)
AST: 10 IU/L (ref 0–40)
Albumin/Globulin Ratio: 2.2 (ref 1.2–2.2)
Albumin: 4.1 g/dL (ref 3.9–4.9)
Alkaline Phosphatase: 111 IU/L (ref 44–121)
BUN/Creatinine Ratio: 18 (ref 10–24)
BUN: 17 mg/dL (ref 8–27)
Bilirubin Total: 0.5 mg/dL (ref 0.0–1.2)
Calcium: 9.4 mg/dL (ref 8.6–10.2)
Chloride: 106 mmol/L (ref 96–106)
Creatinine, Ser: 0.93 mg/dL (ref 0.76–1.27)
Globulin, Total: 1.9 g/dL (ref 1.5–4.5)
Glucose: 93 mg/dL (ref 70–99)
Potassium: 4.2 mmol/L (ref 3.5–5.2)
Sodium: 140 mmol/L (ref 134–144)
Total Protein: 6 g/dL (ref 6.0–8.5)
eGFR: 88 mL/min/{1.73_m2} (ref >59)

## 2022-08-31 LAB — LDL CHOLESTEROL, DIRECT: LDL Direct: 48 mg/dL (ref 0–99)

## 2022-09-04 ENCOUNTER — Ambulatory Visit: Payer: Medicare PPO

## 2022-09-04 DIAGNOSIS — I2510 Atherosclerotic heart disease of native coronary artery without angina pectoris: Secondary | ICD-10-CM | POA: Diagnosis not present

## 2022-09-04 DIAGNOSIS — I5032 Chronic diastolic (congestive) heart failure: Secondary | ICD-10-CM | POA: Diagnosis not present

## 2022-09-04 NOTE — Progress Notes (Signed)
Pt aware.

## 2022-09-08 IMAGING — CR DG CHEST 2V
2 series · 2 of 2 positions shown · non-contrast
Comparison: Seven days ago

CLINICAL DATA: Shortness of breath

EXAM:
CHEST - 2 VIEW

[w chest pa]
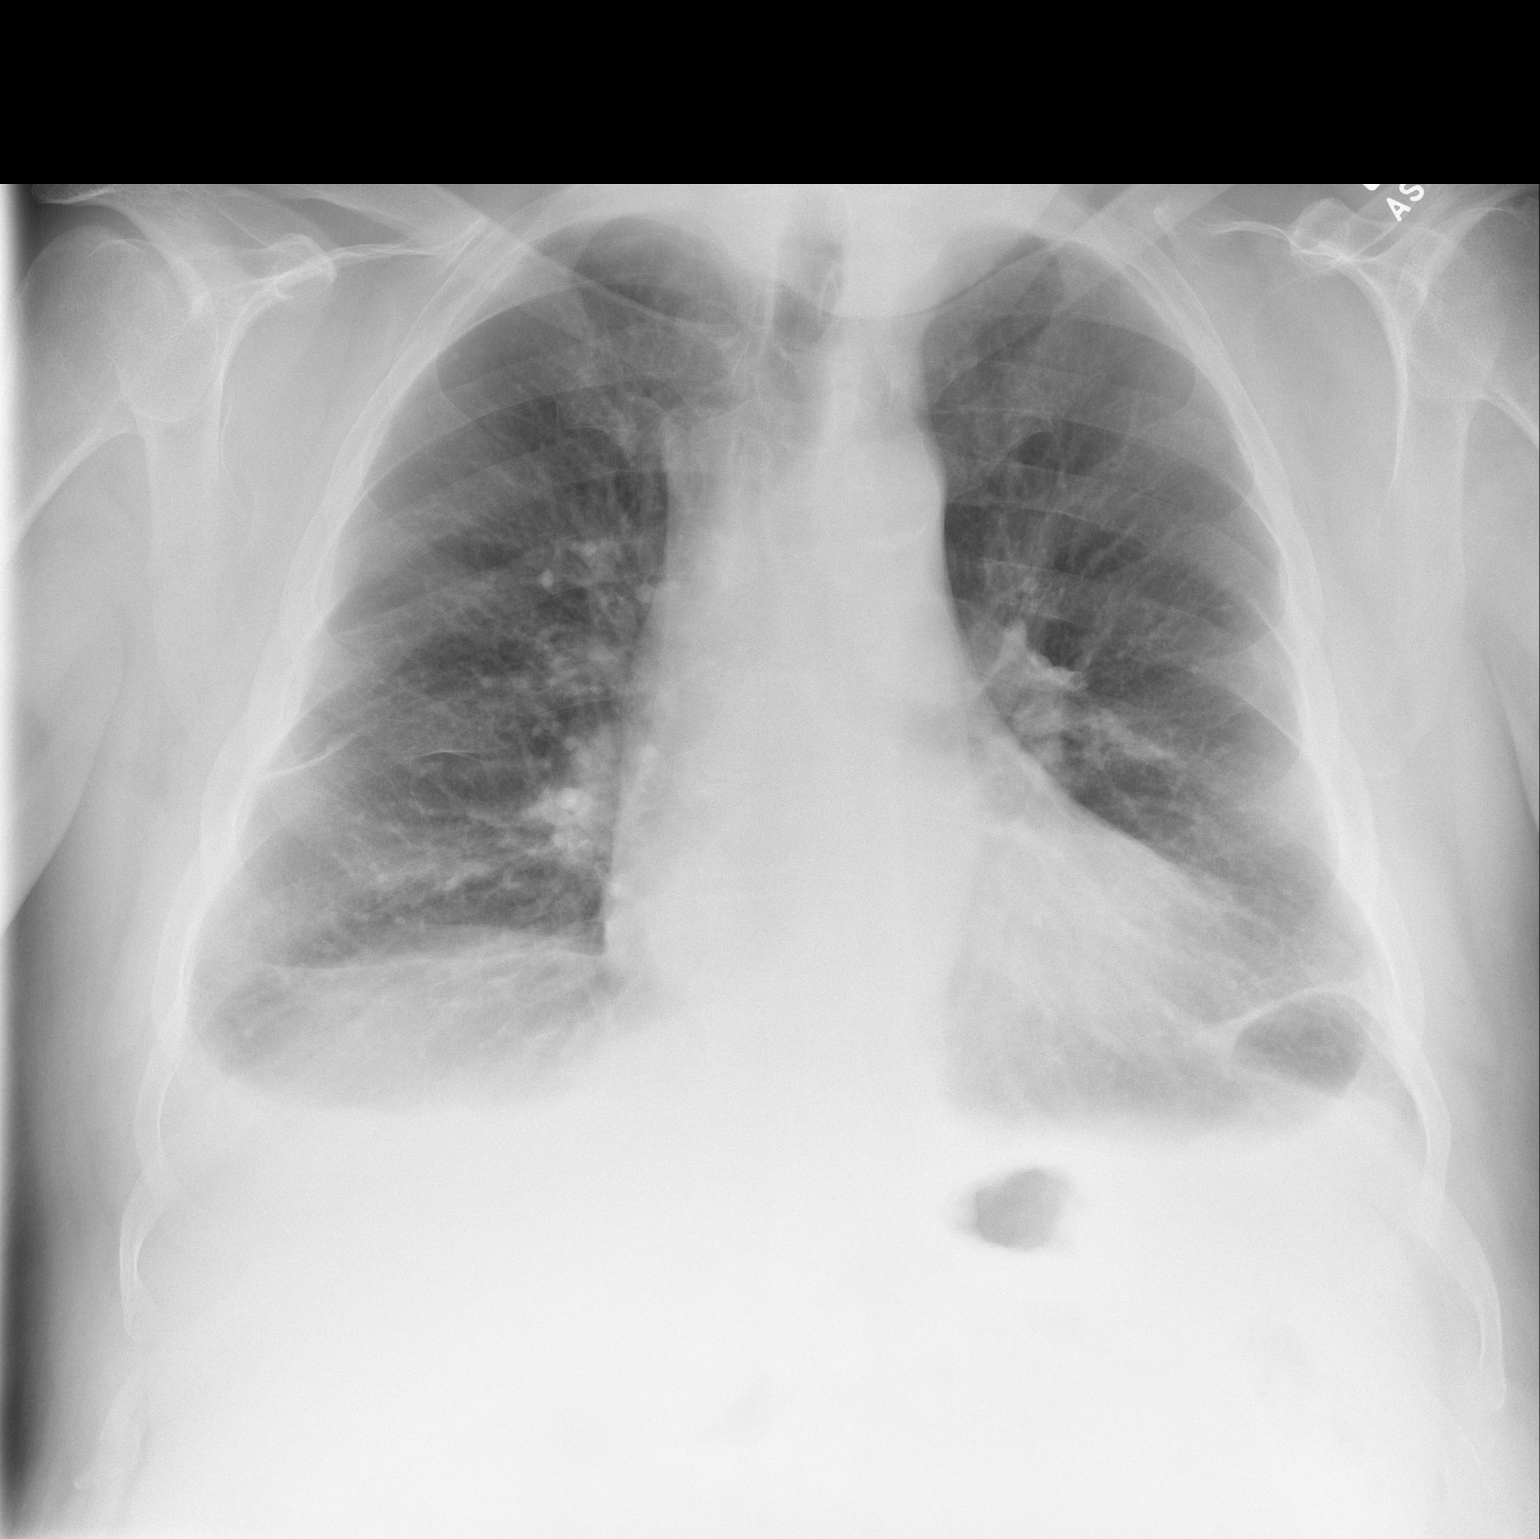

[w chest lat *]
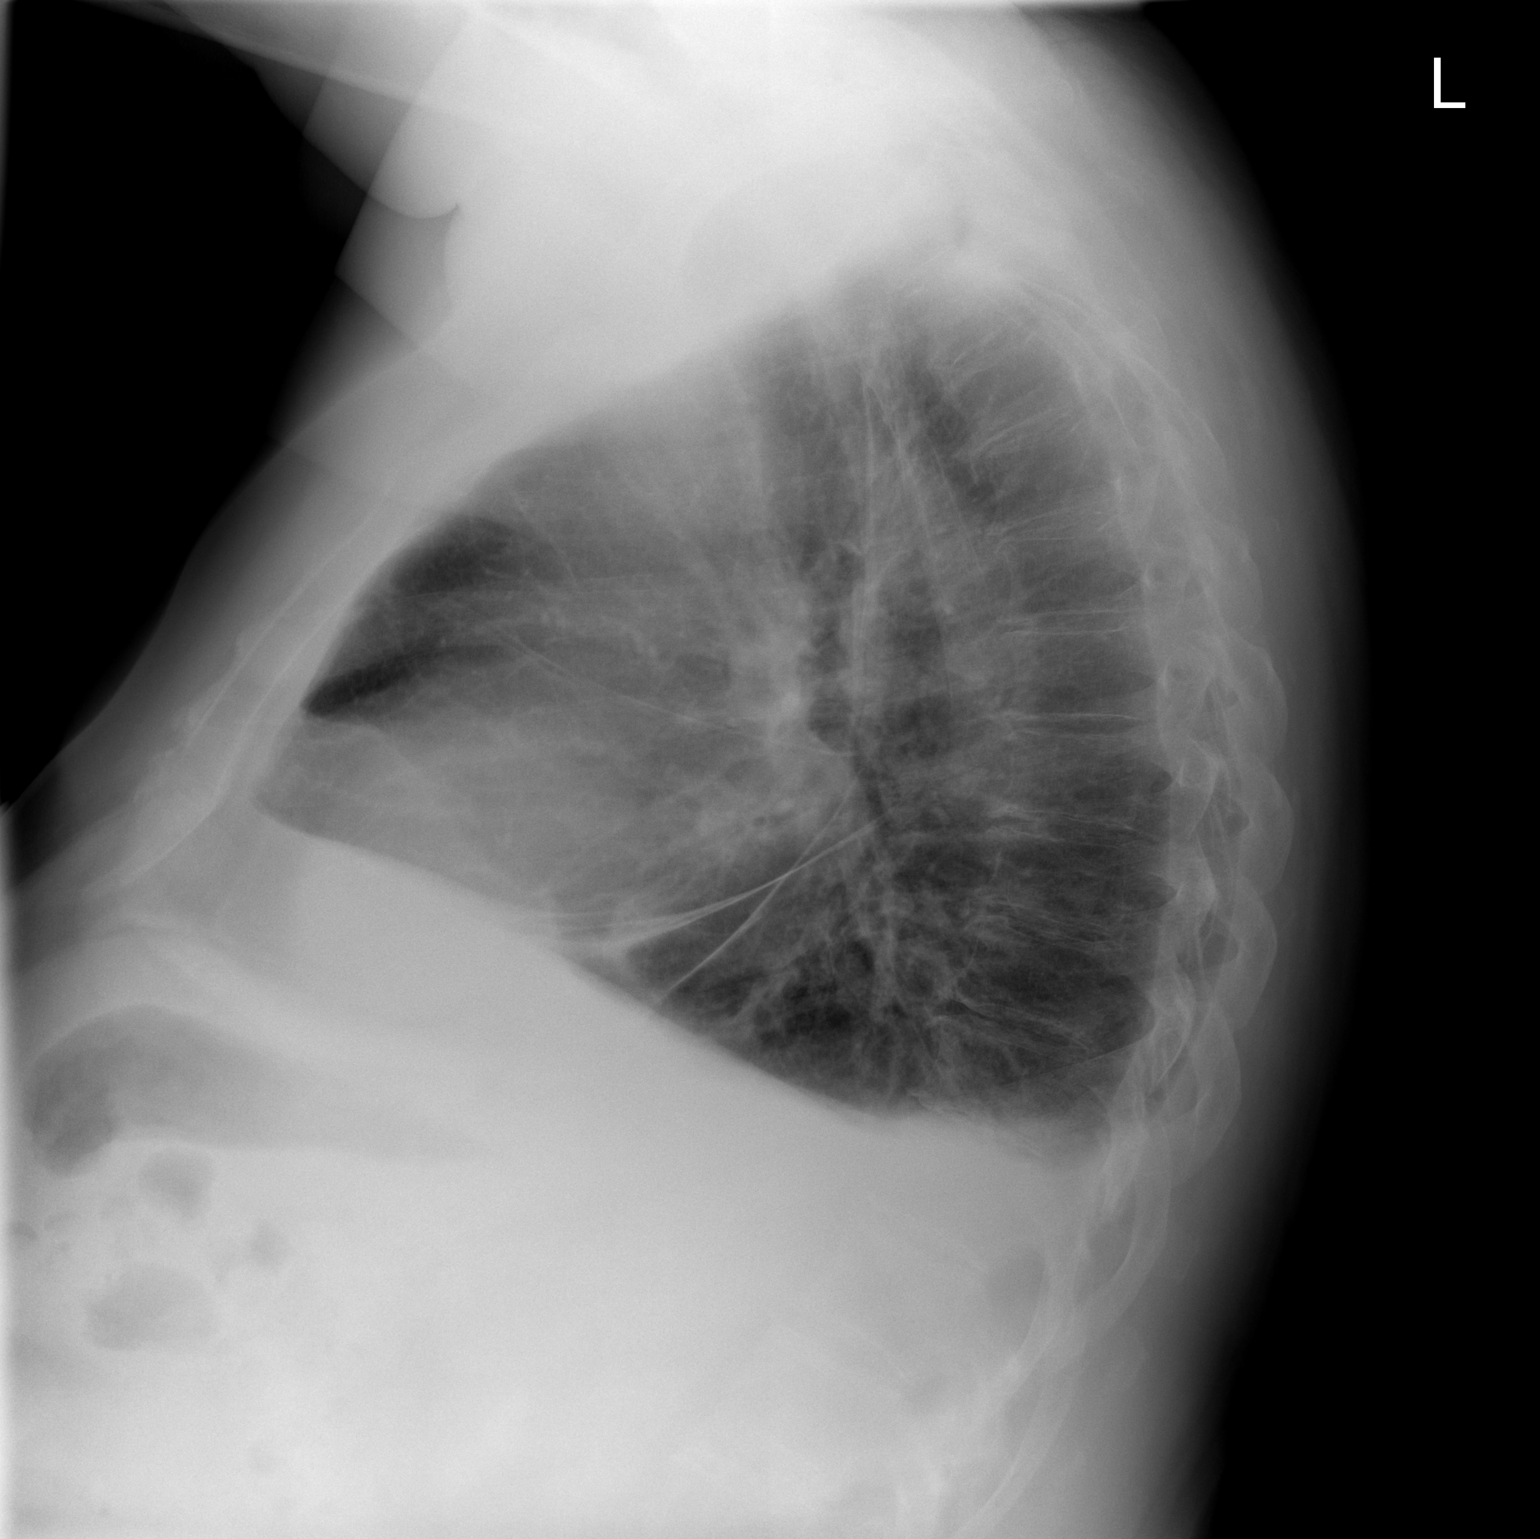

[2 of 2 positions shown; findings below may reference images not displayed]

FINDINGS: Borderline heart size that is stable. Mild interstitial coarsening
and trace pleural effusions. No edema, consolidation, or
pneumothorax. Mild pulmonary scarring on the right. No acute osseous
finding.
IMPRESSION: Trace pleural effusions.  No infiltrate or edema.

## 2022-09-12 ENCOUNTER — Other Ambulatory Visit: Payer: Self-pay

## 2022-09-12 DIAGNOSIS — M545 Low back pain, unspecified: Secondary | ICD-10-CM | POA: Diagnosis not present

## 2022-09-12 DIAGNOSIS — Z6836 Body mass index (BMI) 36.0-36.9, adult: Secondary | ICD-10-CM | POA: Diagnosis not present

## 2022-09-12 DIAGNOSIS — Z79899 Other long term (current) drug therapy: Secondary | ICD-10-CM | POA: Diagnosis not present

## 2022-09-12 DIAGNOSIS — M5416 Radiculopathy, lumbar region: Secondary | ICD-10-CM | POA: Diagnosis not present

## 2022-09-12 MED ORDER — SACUBITRIL-VALSARTAN 49-51 MG PO TABS: 1.0000 | ORAL_TABLET | Freq: Two times a day (BID) | ORAL | 10 refills | Status: DC

## 2022-09-14 DIAGNOSIS — I5032 Chronic diastolic (congestive) heart failure: Secondary | ICD-10-CM | POA: Diagnosis not present

## 2022-09-18 ENCOUNTER — Other Ambulatory Visit: Payer: Self-pay | Admitting: Oncology

## 2022-09-18 ENCOUNTER — Other Ambulatory Visit (HOSPITAL_COMMUNITY): Payer: Self-pay

## 2022-09-18 DIAGNOSIS — C61 Malignant neoplasm of prostate: Secondary | ICD-10-CM

## 2022-09-18 MED ORDER — ABIRATERONE ACETATE 250 MG PO TABS
1000.0000 mg | ORAL_TABLET | Freq: Every day | ORAL | 0 refills | Status: DC
  Filled 2022-09-18: qty 120, 30d supply, fill #0

## 2022-09-23 ENCOUNTER — Other Ambulatory Visit: Payer: Self-pay

## 2022-10-03 ENCOUNTER — Other Ambulatory Visit: Payer: Self-pay | Admitting: Cardiology

## 2022-10-03 DIAGNOSIS — I5032 Chronic diastolic (congestive) heart failure: Secondary | ICD-10-CM

## 2022-10-03 DIAGNOSIS — R0609 Other forms of dyspnea: Secondary | ICD-10-CM

## 2022-10-04 ENCOUNTER — Ambulatory Visit: Payer: Medicare PPO | Admitting: Cardiology

## 2022-10-04 ENCOUNTER — Telehealth: Payer: Self-pay

## 2022-10-04 NOTE — Telephone Encounter (Signed)
Patient has been diagnosed with COVID and wants to know if you can call in the medication Paxlovid for him to pharmacy on file. Says his PCP isn't returning his calls and he needs it as soon as possible.

## 2022-10-07 ENCOUNTER — Other Ambulatory Visit: Payer: Self-pay

## 2022-10-07 ENCOUNTER — Other Ambulatory Visit: Payer: Self-pay | Admitting: Cardiology

## 2022-10-07 ENCOUNTER — Telehealth: Payer: Self-pay | Admitting: *Deleted

## 2022-10-07 DIAGNOSIS — I5032 Chronic diastolic (congestive) heart failure: Secondary | ICD-10-CM

## 2022-10-07 MED ORDER — PREDNISONE 5 MG PO TABS: 5.0000 mg | ORAL_TABLET | Freq: Every day | ORAL | 1 refills | Status: DC

## 2022-10-07 NOTE — Telephone Encounter (Signed)
Bobby [name] is a former patient of Dr Alen Blew. States he has been taking prednisone for 5 years.He called pharmacy for a refill and they state they have not heard from [place]. Was last prescribed while in hospital- no provider listed.  (Takes Zytiga)  Scheduled to see Dr Benay Spice 4/12

## 2022-10-07 NOTE — Telephone Encounter (Signed)
Called patient back and was informed about the message above.

## 2022-10-07 NOTE — Telephone Encounter (Signed)
Please refer him back to PCP.   Dr. Terri Skains

## 2022-10-14 ENCOUNTER — Other Ambulatory Visit: Payer: Self-pay | Admitting: Oncology

## 2022-10-15 ENCOUNTER — Other Ambulatory Visit (HOSPITAL_COMMUNITY): Payer: Self-pay

## 2022-10-15 ENCOUNTER — Other Ambulatory Visit: Payer: Self-pay

## 2022-10-15 ENCOUNTER — Other Ambulatory Visit: Payer: Self-pay | Admitting: Oncology

## 2022-10-15 DIAGNOSIS — C61 Malignant neoplasm of prostate: Secondary | ICD-10-CM

## 2022-10-15 DIAGNOSIS — I5032 Chronic diastolic (congestive) heart failure: Secondary | ICD-10-CM | POA: Diagnosis not present

## 2022-10-15 MED ORDER — ABIRATERONE ACETATE 250 MG PO TABS
1000.0000 mg | ORAL_TABLET | Freq: Every day | ORAL | 2 refills | Status: DC
  Filled 2022-10-15: qty 120, 30d supply, fill #0
  Filled 2022-11-13: qty 120, 30d supply, fill #1
  Filled 2022-12-10 (×2): qty 120, 30d supply, fill #2

## 2022-10-18 ENCOUNTER — Other Ambulatory Visit: Payer: Self-pay

## 2022-10-18 MED ORDER — SACUBITRIL-VALSARTAN 49-51 MG PO TABS: 1.0000 | ORAL_TABLET | Freq: Two times a day (BID) | ORAL | 1 refills | Status: DC

## 2022-10-18 MED ORDER — SACUBITRIL-VALSARTAN 49-51 MG PO TABS: 1.0000 | ORAL_TABLET | Freq: Two times a day (BID) | ORAL | 3 refills | Status: DC

## 2022-10-21 ENCOUNTER — Other Ambulatory Visit: Payer: Self-pay

## 2022-11-05 DIAGNOSIS — M4316 Spondylolisthesis, lumbar region: Secondary | ICD-10-CM | POA: Diagnosis not present

## 2022-11-05 DIAGNOSIS — M5416 Radiculopathy, lumbar region: Secondary | ICD-10-CM | POA: Diagnosis not present

## 2022-11-08 DIAGNOSIS — I872 Venous insufficiency (chronic) (peripheral): Secondary | ICD-10-CM | POA: Diagnosis not present

## 2022-11-08 DIAGNOSIS — D225 Melanocytic nevi of trunk: Secondary | ICD-10-CM | POA: Diagnosis not present

## 2022-11-13 ENCOUNTER — Other Ambulatory Visit (HOSPITAL_COMMUNITY): Payer: Self-pay

## 2022-11-14 DIAGNOSIS — I5032 Chronic diastolic (congestive) heart failure: Secondary | ICD-10-CM | POA: Diagnosis not present

## 2022-11-15 ENCOUNTER — Other Ambulatory Visit (HOSPITAL_COMMUNITY): Payer: Self-pay

## 2022-11-29 DIAGNOSIS — I5032 Chronic diastolic (congestive) heart failure: Secondary | ICD-10-CM | POA: Diagnosis not present

## 2022-11-29 DIAGNOSIS — N1831 Chronic kidney disease, stage 3a: Secondary | ICD-10-CM | POA: Diagnosis not present

## 2022-11-29 DIAGNOSIS — D500 Iron deficiency anemia secondary to blood loss (chronic): Secondary | ICD-10-CM | POA: Diagnosis not present

## 2022-11-29 DIAGNOSIS — I130 Hypertensive heart and chronic kidney disease with heart failure and stage 1 through stage 4 chronic kidney disease, or unspecified chronic kidney disease: Secondary | ICD-10-CM | POA: Diagnosis not present

## 2022-11-29 DIAGNOSIS — I2510 Atherosclerotic heart disease of native coronary artery without angina pectoris: Secondary | ICD-10-CM | POA: Diagnosis not present

## 2022-11-29 DIAGNOSIS — R7303 Prediabetes: Secondary | ICD-10-CM | POA: Diagnosis not present

## 2022-11-29 DIAGNOSIS — C61 Malignant neoplasm of prostate: Secondary | ICD-10-CM | POA: Diagnosis not present

## 2022-11-29 DIAGNOSIS — E785 Hyperlipidemia, unspecified: Secondary | ICD-10-CM | POA: Diagnosis not present

## 2022-12-10 ENCOUNTER — Other Ambulatory Visit (HOSPITAL_COMMUNITY): Payer: Self-pay

## 2022-12-12 ENCOUNTER — Other Ambulatory Visit: Payer: Self-pay | Admitting: *Deleted

## 2022-12-12 ENCOUNTER — Other Ambulatory Visit (HOSPITAL_COMMUNITY): Payer: Self-pay

## 2022-12-12 DIAGNOSIS — C61 Malignant neoplasm of prostate: Secondary | ICD-10-CM

## 2022-12-12 DIAGNOSIS — D509 Iron deficiency anemia, unspecified: Secondary | ICD-10-CM

## 2022-12-14 DIAGNOSIS — I5032 Chronic diastolic (congestive) heart failure: Secondary | ICD-10-CM | POA: Diagnosis not present

## 2022-12-20 ENCOUNTER — Other Ambulatory Visit: Payer: Self-pay | Admitting: *Deleted

## 2022-12-20 ENCOUNTER — Inpatient Hospital Stay: Payer: Medicare PPO

## 2022-12-20 ENCOUNTER — Encounter: Payer: Self-pay | Admitting: *Deleted

## 2022-12-20 ENCOUNTER — Inpatient Hospital Stay: Payer: Medicare PPO | Admitting: Oncology

## 2022-12-20 ENCOUNTER — Inpatient Hospital Stay: Payer: Medicare PPO | Attending: Oncology

## 2022-12-20 VITALS — BP 137/62 | HR 66 | Temp 98.1°F | Resp 18 | Ht 66.5 in | Wt 237.0 lb

## 2022-12-20 DIAGNOSIS — C61 Malignant neoplasm of prostate: Secondary | ICD-10-CM | POA: Diagnosis not present

## 2022-12-20 DIAGNOSIS — R590 Localized enlarged lymph nodes: Secondary | ICD-10-CM | POA: Insufficient documentation

## 2022-12-20 DIAGNOSIS — Z862 Personal history of diseases of the blood and blood-forming organs and certain disorders involving the immune mechanism: Secondary | ICD-10-CM | POA: Insufficient documentation

## 2022-12-20 DIAGNOSIS — Z5111 Encounter for antineoplastic chemotherapy: Secondary | ICD-10-CM | POA: Insufficient documentation

## 2022-12-20 DIAGNOSIS — D509 Iron deficiency anemia, unspecified: Secondary | ICD-10-CM | POA: Diagnosis not present

## 2022-12-20 LAB — CBC WITH DIFFERENTIAL (CANCER CENTER ONLY)
Abs Immature Granulocytes: 0.05 10*3/uL (ref 0.00–0.07)
Basophils Absolute: 0.1 10*3/uL (ref 0.0–0.1)
Basophils Relative: 1 %
Eosinophils Absolute: 0.1 10*3/uL (ref 0.0–0.5)
Eosinophils Relative: 1 %
HCT: 44.9 % (ref 39.0–52.0)
Hemoglobin: 13.7 g/dL (ref 13.0–17.0)
Immature Granulocytes: 1 %
Lymphocytes Relative: 8 %
Lymphs Abs: 0.8 10*3/uL (ref 0.7–4.0)
MCH: 22.4 pg — ABNORMAL LOW (ref 26.0–34.0)
MCHC: 30.5 g/dL (ref 30.0–36.0)
MCV: 73.4 fL — ABNORMAL LOW (ref 80.0–100.0)
Monocytes Absolute: 0.5 10*3/uL (ref 0.1–1.0)
Monocytes Relative: 5 %
Neutro Abs: 9.4 10*3/uL — ABNORMAL HIGH (ref 1.7–7.7)
Neutrophils Relative %: 84 %
Platelet Count: 276 10*3/uL (ref 150–400)
RBC: 6.12 MIL/uL — ABNORMAL HIGH (ref 4.22–5.81)
RDW: 20.1 % — ABNORMAL HIGH (ref 11.5–15.5)
WBC Count: 10.9 10*3/uL — ABNORMAL HIGH (ref 4.0–10.5)
nRBC: 0 % (ref 0.0–0.2)

## 2022-12-20 LAB — CMP (CANCER CENTER ONLY)
ALT: 11 U/L (ref 0–44)
AST: 13 U/L — ABNORMAL LOW (ref 15–41)
Albumin: 4.6 g/dL (ref 3.5–5.0)
Alkaline Phosphatase: 95 U/L (ref 38–126)
Anion gap: 10 (ref 5–15)
BUN: 15 mg/dL (ref 8–23)
CO2: 24 mmol/L (ref 22–32)
Calcium: 10.1 mg/dL (ref 8.9–10.3)
Chloride: 105 mmol/L (ref 98–111)
Creatinine: 0.91 mg/dL (ref 0.61–1.24)
GFR, Estimated: >60 mL/min (ref >60)
Glucose, Bld: 123 mg/dL — ABNORMAL HIGH (ref 70–99)
Potassium: 4.4 mmol/L (ref 3.5–5.1)
Sodium: 139 mmol/L (ref 135–145)
Total Bilirubin: 0.5 mg/dL (ref 0.3–1.2)
Total Protein: 7.4 g/dL (ref 6.5–8.1)

## 2022-12-20 LAB — URINALYSIS, COMPLETE (UACMP) WITH MICROSCOPIC
Bacteria, UA: NONE SEEN
Bilirubin Urine: NEGATIVE
Glucose, UA: >1000 mg/dL — AB
Ketones, ur: NEGATIVE mg/dL
Leukocytes,Ua: NEGATIVE
Nitrite: NEGATIVE
Protein, ur: NEGATIVE mg/dL
Specific Gravity, Urine: 1.018 (ref 1.005–1.030)
pH: 7 (ref 5.0–8.0)

## 2022-12-20 LAB — FERRITIN: Ferritin: 6 ng/mL — ABNORMAL LOW (ref 24–336)

## 2022-12-20 MED ORDER — LEUPROLIDE ACETATE (4 MONTH) 30 MG ~~LOC~~ KIT
30.0000 mg | PACK | Freq: Once | SUBCUTANEOUS | Status: AC
  Administered 2022-12-20: 30 mg via SUBCUTANEOUS
  Filled 2022-12-20: qty 30

## 2022-12-20 NOTE — Patient Instructions (Signed)
Leuprolide Suspension for Injection (Prostate Cancer) What is this medication? LEUPROLIDE (loo PROE lide) reduces the symptoms of prostate cancer. It works by decreasing levels of the hormone testosterone in the body. This prevents prostate cancer cells from spreading or growing. This medicine may be used for other purposes; ask your health care provider or pharmacist if you have questions. COMMON BRAND NAME(S): Eligard, Lupron Depot, Lupron Depot-Ped, Lutrate Depot, Viadur What should I tell my care team before I take this medication? They need to know if you have any of these conditions: Diabetes Heart disease Heart failure High or low levels of electrolytes, such as magnesium, potassium, or sodium in your blood Irregular heartbeat or rhythm Seizures An unusual or allergic reaction to leuprolide, other medications, foods, dyes, or preservatives Pregnant or trying to get pregnant Breast-feeding How should I use this medication? This medication is injected under the skin or into a muscle. It is given by your care team in a hospital or clinic setting. Talk to your care team about the use of this medication in children. Special care may be needed. Overdosage: If you think you have taken too much of this medicine contact a poison control center or emergency room at once. NOTE: This medicine is only for you. Do not share this medicine with others. What if I miss a dose? Keep appointments for follow-up doses. It is important not to miss your dose. Call your care team if you are unable to keep an appointment. What may interact with this medication? Do not take this medication with any of the following: Cisapride Dronedarone Ketoconazole Levoketoconazole Pimozide Thioridazine This medication may also interact with the following: Other medications that cause heart rhythm changes This list may not describe all possible interactions. Give your health care provider a list of all the medicines,  herbs, non-prescription drugs, or dietary supplements you use. Also tell them if you smoke, drink alcohol, or use illegal drugs. Some items may interact with your medicine. What should I watch for while using this medication? Visit your care team for regular checks on your progress. Tell your care team if your symptoms do not start to get better or if they get worse. This medication may increase blood sugar. The risk may be higher in patients who already have diabetes. Ask your care team what you can do to lower the risk of diabetes while taking this medication. This medication may cause infertility. Talk to your care team if you are concerned about your fertility. Heart attacks and strokes have been reported with the use of this medication. Get emergency help if you develop signs or symptoms of a heart attack or stroke. Talk to your care team about the risks and benefits of this medication. What side effects may I notice from receiving this medication? Side effects that you should report to your care team as soon as possible: Allergic reactions--skin rash, itching, hives, swelling of the face, lips, tongue, or throat Heart attack--pain or tightness in the chest, shoulders, arms, or jaw, nausea, shortness of breath, cold or clammy skin, feeling faint or lightheaded Heart rhythm changes--fast or irregular heartbeat, dizziness, feeling faint or lightheaded, chest pain, trouble breathing High blood sugar (hyperglycemia)--increased thirst or amount of urine, unusual weakness or fatigue, blurry vision Mood swings, irritability, hostility Seizures Stroke--sudden numbness or weakness of the face, arm, or leg, trouble speaking, confusion, trouble walking, loss of balance or coordination, dizziness, severe headache, change in vision Thoughts of suicide or self-harm, worsening mood, feelings of depression Side   effects that usually do not require medical attention (report to your care team if they continue or  are bothersome): Bone pain Change in sex drive or performance General discomfort and fatigue Hot flashes Muscle pain Pain, redness, or irritation at injection site Swelling of the ankles, hands, or feet This list may not describe all possible side effects. Call your doctor for medical advice about side effects. You may report side effects to FDA at 1-800-FDA-1088. Where should I keep my medication? This medication is given in a hospital or clinic. It will not be stored at home. NOTE: This sheet is a summary. It may not cover all possible information. If you have questions about this medicine, talk to your doctor, pharmacist, or health care provider.  2023 Elsevier/Gold Standard (2021-11-05 00:00:00)  

## 2022-12-20 NOTE — Progress Notes (Signed)
Called [facility] to request all past endoscopy/colonoscopy reports and as well as [facility] for report of possible CT done on 05/21/2017 to be faxed.

## 2022-12-20 NOTE — Progress Notes (Signed)
[facility] OFFICE PROGRESS NOTE   Diagnosis: Prostate cancer  INTERVAL HISTORY:   Mr Bobby [name] was diagnosed with prostate cancer in 2018 when he presented with an elevated PSA.  He was referred to Dr. Annabell Howells and underwent a prostate biopsy 04/24/2017.  This confirmed a Gleason 4+5 equal 9 prostate cancer.  A bone scan was negative for evidence of metastatic disease.  A CT revealed sigmoid diverticulitis and enlarged retroperitoneal and pelvic lymph nodes.  He was referred to Dr. Clelia Croft and seen in the multidisciplinary prostate cancer clinic.  The adenopathy noted on CT was felt to be highly suggestive of malignancy.  Radiation was not recommended.  He was started on systemic therapy with leuprolide and abiraterone/prednisone.  He entered clinical remission after starting androgen deprivation therapy and abiraterone.  When he saw Dr. Clelia Croft 08/22/2022 the PSA returned at less than 0.1.  He was diagnosed with diverticulitis and April 2023.  He was diagnosed with iron deficiency anemia in August 2023.  He reports taking iron for approximately 1 month.  Iron was discontinued secondary to nausea and constipation.  Mr Bobby [name] reports feeling well.  He has chronic pain of the low back and is considering low lumbar surgery after the pain has not improved with multiple interventions.   Medical history: CHF Hyperlipidemia Iron deficiency anemia August 2023 Diverticulitis April 2023 Lumbar disc disease Past surgical history: Inguinal hernia repair as a child  Family history: His brother had pancreatic cancer at age 61.  A sister has breast cancer.  Social history: He is retired from Manufacturing engineer.  He lives with his wife in [place].  He smokes cigarettes in the past.  Social alcohol use.  No risk factor for hepatitis.  Review of systems: Positives-decreased urinary stream, low back pain, fatigue, nocturia  A complete review of systems was otherwise  negative  Objective:  Vital signs in last 24 hours:  Blood pressure 137/62, pulse 66, temperature 98.1 F (36.7 C), temperature source Oral, resp. rate 18, height 5' 6.5" (1.689 m), weight 237 lb (107.5 kg), SpO2 99 %.    HEENT: Oropharynx without visible mass, neck without mass Lymphatics: No cervical, supraclavicular, axillary, or inguinal nodes Resp: Lungs clear bilaterally Cardio: Regular rate and rhythm GI: No hepatosplenomegaly Vascular: No leg edema Neuro: The motor exam appears intact in the upper and lower extremities bilaterally Musculoskeletal: No spine tenderness    Lab Results:  Lab Results  Component Value Date   WBC 10.9 (H) 12/20/2022   HGB 13.7 12/20/2022   HCT 44.9 12/20/2022   MCV 73.4 (L) 12/20/2022   PLT 276 12/20/2022   NEUTROABS 9.4 (H) 12/20/2022    CMP  Lab Results  Component Value Date   NA 139 12/20/2022   K 4.4 12/20/2022   CL 105 12/20/2022   CO2 24 12/20/2022   GLUCOSE 123 (H) 12/20/2022   BUN 15 12/20/2022   CREATININE 0.91 12/20/2022   CALCIUM 10.1 12/20/2022   PROT 7.4 12/20/2022   ALBUMIN 4.6 12/20/2022   AST 13 (L) 12/20/2022   ALT 11 12/20/2022   ALKPHOS 95 12/20/2022   BILITOT 0.5 12/20/2022   GFRNONAA >60 12/20/2022   GFRAA 81 09/29/2020    Medications: I have reviewed the patient's current medications.   Assessment/Plan: Prostate cancer-Gleason 4+5 = 9, PSA 14.15 April 2017 Negative bone scan September 2018 CT September 2018-diverticulitis in the sigmoid colon, enlarged retroperitoneal and pelvic lymph nodes Evaluated in the multidisciplinary prostate cancer clinic and considered to have metastatic  disease, radiation not recommended Started treatment with leuprolide and abiraterone/prednisone (October 2018) CT abdomen/pelvis 12/20/2021- acute sigmoid diverticulitis, no adenopathy  2.  CHF 3.  Hyperlipidemia 4.  Lumbar disc disease 5.  Iron deficiency anemia August 2023 6.  History of Kidney stones 7.   Hypertension    Disposition: [name] was diagnosed with metastatic prostate cancer in 2018. He remains in clinical remission while on androgen deprivation therapy and zytiga.  He will continue the current treatment.  He is scheduled for luprolide today.  We will follow up on the PSA from today.  He was diagnosed with iron deficiency anemia in August of 2023.  No source for blood loss has been identified. We will request his most recent colonoscopy report and he will return stool hemoccult cards.We will check a urinalysis today.   Mr Bobby [name] will return for an office visit  in 6 weeks.  Thornton Papas, MD  12/20/2022  2:21 PM

## 2022-12-21 LAB — PROSTATE-SPECIFIC AG, SERUM (LABCORP): Prostate Specific Ag, Serum: <0.1 ng/mL (ref 0.0–4.0)

## 2022-12-23 ENCOUNTER — Telehealth: Payer: Self-pay

## 2022-12-23 NOTE — Telephone Encounter (Signed)
Patient gave verbal understanding and had no further questions or concerns  

## 2022-12-23 NOTE — Telephone Encounter (Signed)
-----   Message from Ladene Artist, MD sent at 12/21/2022 11:59 AM EDT ----- Please call patient, the psa remains low, f/u as scheduled

## 2022-12-31 ENCOUNTER — Encounter: Payer: Self-pay | Admitting: Pulmonary Disease

## 2022-12-31 ENCOUNTER — Ambulatory Visit: Payer: Medicare PPO | Admitting: Pulmonary Disease

## 2022-12-31 ENCOUNTER — Other Ambulatory Visit: Payer: Self-pay | Admitting: Oncology

## 2022-12-31 VITALS — BP 118/58 | HR 69 | Ht 66.0 in | Wt 239.0 lb

## 2022-12-31 DIAGNOSIS — G4733 Obstructive sleep apnea (adult) (pediatric): Secondary | ICD-10-CM | POA: Diagnosis not present

## 2022-12-31 NOTE — Patient Instructions (Signed)
DME referral for CPAP supplies  New mask  Graded exercise as tolerated  Good luck with your surgery  I do not believe there is any restriction to your surgery at the present time as long as you are feeling fine going into it  Call with significant concerns  I will see you a year from now

## 2022-12-31 NOTE — Progress Notes (Signed)
[address] Bobby [name]    [id number]    1951/03/31  Primary Care Physician:South, Jeannett Senior, MD  Referring Physician: Adrian Prince, MD [address] [place],  [place] [address]  Chief complaint:   History of obstructive sleep apnea  HPI:  Uses CPAP on a nightly basis Has been compliant with CPAP  Not having any difficulty  Breathing feels better  Tries to stay active  Continues to work on weight loss efforts  Admits to significant dryness of his mouth in the mornings Denies headaches Did have some weight loss  Usually goes to bed about 11 PM, falls asleep easily About 3 awakenings Final wake up time about 7 AM  Reformed smoker  He does have some shortness of breath with exertion Chronic back pain  Outpatient Encounter Medications as of 12/31/2022  Medication Sig   abiraterone acetate (ZYTIGA) 250 MG tablet Take 4 tablets (1,000 mg total) by mouth daily. Take on an empty stomach 1 hour before or 2 hours after a meal   aspirin EC 81 MG tablet Take 1 tablet (81 mg total) by mouth daily.   atorvastatin (LIPITOR) 40 MG tablet TAKE ONE TABLET BY MOUTH (40 MG total) DAILY   diltiazem (CARDIZEM CD) 360 MG 24 hr capsule TAKE ONE CAPSULE BY MOUTH ONCE DAILY   empagliflozin (JARDIANCE) 10 MG TABS tablet Take 1 tablet (10 mg total) by mouth daily before breakfast.   lidocaine (LIDODERM) 5 % Place 1 patch onto the skin daily.   predniSONE (DELTASONE) 5 MG tablet TAKE 1 Tablet BY MOUTH ONCE DAILY with BREAKFAST   pregabalin (LYRICA) 75 MG capsule Take 75 mg by mouth daily.   sacubitril-valsartan (ENTRESTO) 49-51 MG Take 1 tablet by mouth 2 (two) times daily.   solifenacin (VESICARE) 5 MG tablet Take 5 mg by mouth daily.   tamsulosin (FLOMAX) 0.4 MG CAPS capsule Take 1 capsule (0.4 mg total) by mouth daily after supper. (Patient taking differently: Take 0.4 mg by mouth at bedtime.)   torsemide (DEMADEX) 20 MG tablet take 1/2 TABLET BY MOUTH DAILY   traMADol (ULTRAM) 50  MG tablet Take 50 mg by mouth 2 (two) times daily as needed for moderate pain.    No facility-administered encounter medications on file as of 12/31/2022.    Allergies as of 12/31/2022   (No Known Allergies)    Past Medical History:  Diagnosis Date   Bronchitis    CHF (congestive heart failure) 04/2020   A/C HFpEF   Coronary artery calcification of native artery    Family history of pancreatic cancer    History of kidney stones    Hyperlipidemia    Hypertension    Pneumonia    Prostate cancer     Past Surgical History:  Procedure Laterality Date   EXTRACORPOREAL SHOCK WAVE LITHOTRIPSY Right 04/17/2017   Procedure: RIGHT EXTRACORPOREAL SHOCK WAVE LITHOTRIPSY (ESWL);  Surgeon: Ihor Gully, MD;  Location: [facility];  Service: Urology;  Laterality: Right;   HERNIA REPAIR     age 72   LEFT HEART CATH AND CORONARY ANGIOGRAPHY N/A 05/08/2020   Procedure: LEFT HEART CATH AND CORONARY ANGIOGRAPHY;  Surgeon: Yates Decamp, MD;  Location: [facility]AS[facility];  Service: Cardiovascular;  Laterality: N/A;   PROSTATE BIOPSY      Family History  Problem Relation Age of Onset   Breast cancer Sister 25       "negative genetic testing"   Pancreatic cancer Brother 76   Breast  cancer Maternal Aunt    Kidney cancer Maternal Aunt        dx 63s    Social History   Socioeconomic History   Marital status: Married    Spouse name: [name]   Number of children: 2   Years of education: Not on file   Highest education level: Not on file  Occupational History   Not on file  Tobacco Use   Smoking status: Former    Packs/day: 0.25    Years: 10.00    Additional pack years: 0.00    Total pack years: 2.50    Types: Cigarettes    Quit date: 01/20/2012    Years since quitting: 10.9   Smokeless tobacco: Never  Vaping Use   Vaping Use: Never used  Substance and Sexual Activity   Alcohol use: Yes    Comment: occ   Drug use: No   Sexual activity: Yes  Other Topics Concern   Not on file  Social  History Narrative   Not on file   Social Determinants of Health   Financial Resource Strain: Not on file  Food Insecurity: No Food Insecurity (12/20/2022)   Hunger Vital Sign    Worried About Running Out of Food in the Last Year: Never true    Ran Out of Food in the Last Year: Never true  Transportation Needs: No Transportation Needs (12/20/2022)   PRAPARE - Administrator, Civil Service (Medical): No    Lack of Transportation (Non-Medical): No  Physical Activity: Not on file  Stress: Not on file  Social Connections: Not on file  Intimate Partner Violence: Not At Risk (12/20/2022)   Humiliation, Afraid, Rape, and Kick questionnaire    Fear of Current or Ex-Partner: No    Emotionally Abused: No    Physically Abused: No    Sexually Abused: No    Review of Systems  Constitutional:  Positive for fatigue.  Respiratory:  Positive for apnea. Negative for shortness of breath.   Psychiatric/Behavioral:  Positive for sleep disturbance.     Vitals:   12/31/22 1132  BP: (!) 118/58  Pulse: 69  SpO2: 100%     Physical Exam Constitutional:      Appearance: He is obese.  HENT:     Head: Normocephalic and atraumatic.     Mouth/Throat:     Mouth: Mucous membranes are moist.  Eyes:     General: No scleral icterus.    Pupils: Pupils are equal, round, and reactive to light.  Cardiovascular:     Rate and Rhythm: Normal rate and regular rhythm.     Heart sounds: No murmur heard.    No friction rub.  Pulmonary:     Effort: No respiratory distress.     Breath sounds: No stridor. No wheezing or rhonchi.  Musculoskeletal:     Cervical back: No rigidity or tenderness.  Neurological:     Mental Status: He is alert.  Psychiatric:        Mood and Affect: Mood normal.    Data Reviewed: Sleep study was reviewed by myself Compliance data reviewed showing excellent compliance with CPAP Average use of 7 hours 26 minutes AutoSet 5-15 95 percentile pressure of 11.7 with an AHI  of 1.4  Assessment:  Moderate obstructive sleep apnea Continues to tolerate CPAP well Functioning well  Obesity Continues to work on weight loss efforts  Obstructive lung disease -Is on Pulmicort/Brovana  Plan/Recommendations: Continue CPAP on a nightly basis  Follow-up in a  year  Continue bronchodilator treatments  He will be having back surgery at some point soon, he has no preclusions of back surgery from a pulmonary perspective  Encouraged to stay active  Encouraged to have an exercise routine on a regular basis  Follow-up in a year   Virl Diamond MD [facility] 12/31/2022, 11:40 AM  CC: Adrian Prince, MD

## 2023-01-06 ENCOUNTER — Telehealth: Payer: Self-pay | Admitting: Pulmonary Disease

## 2023-01-06 DIAGNOSIS — M5416 Radiculopathy, lumbar region: Secondary | ICD-10-CM | POA: Diagnosis not present

## 2023-01-06 DIAGNOSIS — M4316 Spondylolisthesis, lumbar region: Secondary | ICD-10-CM | POA: Diagnosis not present

## 2023-01-06 NOTE — Telephone Encounter (Signed)
Pt is having surgery on his lower back and will need surgical clearance form  Provider: Sharolyn Douglas  Fax: [phone]

## 2023-01-07 ENCOUNTER — Other Ambulatory Visit (HOSPITAL_COMMUNITY): Payer: Self-pay

## 2023-01-07 ENCOUNTER — Other Ambulatory Visit: Payer: Self-pay

## 2023-01-07 ENCOUNTER — Other Ambulatory Visit: Payer: Self-pay | Admitting: Oncology

## 2023-01-07 DIAGNOSIS — G4733 Obstructive sleep apnea (adult) (pediatric): Secondary | ICD-10-CM | POA: Diagnosis not present

## 2023-01-07 DIAGNOSIS — C61 Malignant neoplasm of prostate: Secondary | ICD-10-CM

## 2023-01-07 MED ORDER — ABIRATERONE ACETATE 250 MG PO TABS
1000.0000 mg | ORAL_TABLET | Freq: Every day | ORAL | 2 refills | Status: DC
Start: 2023-01-07 — End: 2023-04-04
  Filled 2023-01-07: qty 120, 30d supply, fill #0
  Filled 2023-02-04: qty 120, 30d supply, fill #1
  Filled 2023-03-05: qty 120, 30d supply, fill #2

## 2023-01-07 NOTE — Telephone Encounter (Signed)
Fax received from Dr. Sharolyn Douglas with [facility]  to perform Low Back Surgery on patient.  Patient needs surgery clearance. Surgery is Pending. Patient was seen on 12/31/22. Office protocol is a risk assessment can be sent to surgeon if patient has been seen in 60 days or less.   Sending to Dr. Val Eagle for risk assessment or recommendations if patient needs to be seen in office prior to surgical procedure.

## 2023-01-09 NOTE — Telephone Encounter (Signed)
Patient is cleared for surgery from a pulmonary perspective  Has obstructive sleep apnea adequately controlled with CPAP Tolerating CPAP well  Should do well with surgery from a pulmonary perspective, he should be encouraged to bring his CPAP into the hospital to use

## 2023-01-09 NOTE — Telephone Encounter (Signed)
OV notes and clearance form have been faxed back to [facility]. Nothing further needed at this time.

## 2023-01-13 ENCOUNTER — Other Ambulatory Visit: Payer: Self-pay

## 2023-01-13 DIAGNOSIS — I5032 Chronic diastolic (congestive) heart failure: Secondary | ICD-10-CM | POA: Diagnosis not present

## 2023-01-20 ENCOUNTER — Telehealth: Payer: Self-pay | Admitting: *Deleted

## 2023-01-20 NOTE — Telephone Encounter (Signed)
Call from [facility] requesting Dr. Truett Perna provide oncology medical clearance for upcoming procedure planned that is 1 night overnight surgery: L4-S1 tranforaminal lumbar interbody fusion.  They will fax form for MD to complete when is back in office next week.

## 2023-01-21 ENCOUNTER — Encounter: Payer: Self-pay | Admitting: Oncology

## 2023-01-21 ENCOUNTER — Other Ambulatory Visit (HOSPITAL_BASED_OUTPATIENT_CLINIC_OR_DEPARTMENT_OTHER): Payer: Self-pay

## 2023-01-21 DIAGNOSIS — D509 Iron deficiency anemia, unspecified: Secondary | ICD-10-CM

## 2023-01-21 LAB — OCCULT BLOOD X 1 CARD TO LAB, STOOL
Fecal Occult Bld: NEGATIVE
Fecal Occult Bld: NEGATIVE
Fecal Occult Bld: NEGATIVE

## 2023-01-29 ENCOUNTER — Encounter: Payer: Self-pay | Admitting: Cardiology

## 2023-01-29 ENCOUNTER — Telehealth: Payer: Self-pay

## 2023-01-29 NOTE — Telephone Encounter (Signed)
Called and let patient know about surgical clearance information.

## 2023-02-04 ENCOUNTER — Other Ambulatory Visit (HOSPITAL_COMMUNITY): Payer: Self-pay

## 2023-02-05 ENCOUNTER — Inpatient Hospital Stay: Payer: Medicare PPO

## 2023-02-05 ENCOUNTER — Telehealth: Payer: Self-pay

## 2023-02-05 ENCOUNTER — Inpatient Hospital Stay: Payer: Medicare PPO | Attending: Oncology | Admitting: Oncology

## 2023-02-05 VITALS — BP 121/56 | HR 63 | Temp 98.2°F | Resp 18 | Ht 66.0 in | Wt 240.0 lb

## 2023-02-05 DIAGNOSIS — G8929 Other chronic pain: Secondary | ICD-10-CM | POA: Diagnosis not present

## 2023-02-05 DIAGNOSIS — C61 Malignant neoplasm of prostate: Secondary | ICD-10-CM | POA: Diagnosis not present

## 2023-02-05 DIAGNOSIS — D509 Iron deficiency anemia, unspecified: Secondary | ICD-10-CM

## 2023-02-05 DIAGNOSIS — M545 Low back pain, unspecified: Secondary | ICD-10-CM | POA: Insufficient documentation

## 2023-02-05 LAB — CBC WITH DIFFERENTIAL (CANCER CENTER ONLY)
Abs Immature Granulocytes: 0.02 10*3/uL (ref 0.00–0.07)
Basophils Absolute: 0.1 10*3/uL (ref 0.0–0.1)
Basophils Relative: 1 %
Eosinophils Absolute: 0.3 10*3/uL (ref 0.0–0.5)
Eosinophils Relative: 3 %
HCT: 41.9 % (ref 39.0–52.0)
Hemoglobin: 13.2 g/dL (ref 13.0–17.0)
Immature Granulocytes: 0 %
Lymphocytes Relative: 16 %
Lymphs Abs: 1.3 10*3/uL (ref 0.7–4.0)
MCH: 23.9 pg — ABNORMAL LOW (ref 26.0–34.0)
MCHC: 31.5 g/dL (ref 30.0–36.0)
MCV: 75.8 fL — ABNORMAL LOW (ref 80.0–100.0)
Monocytes Absolute: 0.8 10*3/uL (ref 0.1–1.0)
Monocytes Relative: 10 %
Neutro Abs: 5.8 10*3/uL (ref 1.7–7.7)
Neutrophils Relative %: 70 %
Platelet Count: 250 10*3/uL (ref 150–400)
RBC: 5.53 MIL/uL (ref 4.22–5.81)
RDW: 19.9 % — ABNORMAL HIGH (ref 11.5–15.5)
WBC Count: 8.3 10*3/uL (ref 4.0–10.5)
nRBC: 0 % (ref 0.0–0.2)

## 2023-02-05 LAB — FERRITIN: Ferritin: 7 ng/mL — ABNORMAL LOW (ref 24–336)

## 2023-02-05 NOTE — Telephone Encounter (Signed)
-----   Message from Ladene Artist, MD sent at 02/05/2023 12:33 PM EDT ----- Please call patient, iron level is still low, I made a referral to [facility] gastroenterology, follow-up as scheduled

## 2023-02-05 NOTE — Telephone Encounter (Signed)
Patient gave verbal understanding and had no further questions or concerns  

## 2023-02-05 NOTE — Progress Notes (Signed)
  [facility] OFFICE PROGRESS NOTE   Diagnosis: Prostate cancer  INTERVAL HISTORY:   Bobby [name] returns as scheduled.  He continues abiraterone and prednisone.  No bleeding.  Good appetite.  He has chronic low back pain.  He is being scheduled for lumbar fusion surgery.    Objective:  Vital signs in last 24 hours:  Blood pressure (!) 121/56, pulse 63, temperature 98.2 F (36.8 C), resp. rate 18, height 5\' 6"  (1.676 m), weight 240 lb (108.9 kg), SpO2 98 %.    Lymphatics: No cervical, supraclavicular, axillary, or inguinal nodes Resp: Lungs clear bilaterally Cardio: Regular rate and rhythm GI: No hepatosplenomegaly, no mass, nontender Vascular: No leg edema   Lab Results:  Lab Results  Component Value Date   WBC 8.3 02/05/2023   HGB 13.2 02/05/2023   HCT 41.9 02/05/2023   MCV 75.8 (L) 02/05/2023   PLT 250 02/05/2023   NEUTROABS 5.8 02/05/2023    CMP  Lab Results  Component Value Date   NA 139 12/20/2022   K 4.4 12/20/2022   CL 105 12/20/2022   CO2 24 12/20/2022   GLUCOSE 123 (H) 12/20/2022   BUN 15 12/20/2022   CREATININE 0.91 12/20/2022   CALCIUM 10.1 12/20/2022   PROT 7.4 12/20/2022   ALBUMIN 4.6 12/20/2022   AST 13 (L) 12/20/2022   ALT 11 12/20/2022   ALKPHOS 95 12/20/2022   BILITOT 0.5 12/20/2022   GFRNONAA >60 12/20/2022   GFRAA 81 09/29/2020    No results found for: "CEA1", "CEA", "ZOX096", "CA125"  Lab Results  Component Value Date   INR 1.0 05/06/2020   LABPROT 13.1 05/06/2020    Imaging:  No results found.  Medications: I have reviewed the patient's current medications.   Assessment/Plan: Prostate cancer-Gleason 4+5 = 9, PSA 14.15 April 2017 Negative bone scan September 2018 CT September 2018-diverticulitis in the sigmoid colon, enlarged retroperitoneal and pelvic lymph nodes Evaluated in the multidisciplinary prostate cancer clinic and considered to have metastatic disease, radiation not recommended Started treatment  with leuprolide and abiraterone/prednisone (October 2018) CT abdomen/pelvis 12/20/2021- acute sigmoid diverticulitis, no adenopathy  2.  CHF 3.  Hyperlipidemia 4.  Lumbar disc disease 5.  Iron deficiency anemia August 2023 Low ferritin 12/20/2022, 02/05/2023 01/20/2023-stool Hemoccult cards negative Colonoscopy 07/09/2018-polyps removed from the ascending and transverse colon-tubular adenomas 6.  History of Kidney stones 7.  Hypertension      Disposition: Bobby [name] has a history of metastatic prostate cancer.  Is in clinical remission.  He will continue every 47-month leuprolide and abiraterone/prednisone.  He is scheduled for an office visit and leuprolide in August.  Bobby [name] is being scheduled for low back surgery.  He has persistent iron deficiency.  The red cells are microcytic, but he is not anemic.  Stool Hemoccult cards were negative.  I will refer him to gastroenterology to consider additional evaluation for a source of blood loss.  He has a history of colon polyps.  Bobby Papas, MD  02/05/2023  11:23 AM

## 2023-02-06 ENCOUNTER — Other Ambulatory Visit: Payer: Self-pay | Admitting: Cardiology

## 2023-02-06 DIAGNOSIS — R0609 Other forms of dyspnea: Secondary | ICD-10-CM

## 2023-02-06 DIAGNOSIS — I5032 Chronic diastolic (congestive) heart failure: Secondary | ICD-10-CM

## 2023-02-07 ENCOUNTER — Other Ambulatory Visit (HOSPITAL_COMMUNITY): Payer: Self-pay

## 2023-02-12 DIAGNOSIS — I5032 Chronic diastolic (congestive) heart failure: Secondary | ICD-10-CM | POA: Diagnosis not present

## 2023-02-14 ENCOUNTER — Ambulatory Visit: Payer: Medicare PPO | Admitting: Cardiology

## 2023-02-14 DIAGNOSIS — M48062 Spinal stenosis, lumbar region with neurogenic claudication: Secondary | ICD-10-CM | POA: Insufficient documentation

## 2023-02-21 DIAGNOSIS — M4316 Spondylolisthesis, lumbar region: Secondary | ICD-10-CM | POA: Diagnosis not present

## 2023-02-25 ENCOUNTER — Encounter: Payer: Self-pay | Admitting: Cardiology

## 2023-02-25 ENCOUNTER — Ambulatory Visit: Payer: Medicare PPO | Admitting: Cardiology

## 2023-02-25 VITALS — BP 122/74 | HR 66 | Ht 66.0 in | Wt 239.6 lb

## 2023-02-25 DIAGNOSIS — I10 Essential (primary) hypertension: Secondary | ICD-10-CM

## 2023-02-25 DIAGNOSIS — Z01810 Encounter for preprocedural cardiovascular examination: Secondary | ICD-10-CM | POA: Diagnosis not present

## 2023-02-25 DIAGNOSIS — I2510 Atherosclerotic heart disease of native coronary artery without angina pectoris: Secondary | ICD-10-CM | POA: Diagnosis not present

## 2023-02-25 DIAGNOSIS — I5032 Chronic diastolic (congestive) heart failure: Secondary | ICD-10-CM | POA: Diagnosis not present

## 2023-02-25 DIAGNOSIS — M4316 Spondylolisthesis, lumbar region: Secondary | ICD-10-CM | POA: Diagnosis not present

## 2023-02-25 DIAGNOSIS — Z87891 Personal history of nicotine dependence: Secondary | ICD-10-CM

## 2023-02-25 DIAGNOSIS — M47816 Spondylosis without myelopathy or radiculopathy, lumbar region: Secondary | ICD-10-CM | POA: Diagnosis not present

## 2023-02-25 DIAGNOSIS — E782 Mixed hyperlipidemia: Secondary | ICD-10-CM

## 2023-02-25 DIAGNOSIS — M4807 Spinal stenosis, lumbosacral region: Secondary | ICD-10-CM | POA: Diagnosis not present

## 2023-02-25 DIAGNOSIS — Z01818 Encounter for other preprocedural examination: Secondary | ICD-10-CM | POA: Diagnosis not present

## 2023-02-25 DIAGNOSIS — G4733 Obstructive sleep apnea (adult) (pediatric): Secondary | ICD-10-CM

## 2023-02-25 NOTE — Progress Notes (Signed)
ID:  Bobby Savoy., DOB 07-01-1951, MRN [id number]  PCP:  Adrian Prince, MD  Cardiologist: Tessa Lerner, DO, [facility] (established care 01/21/2020)  Date: 02/25/23 Last Office Visit: 08/15/2022  Chief Complaint  Patient presents with   Chronic heart failure with preserved ejection fraction (HFp   Follow-up   Pre-op Exam    Bobby Krolczyk. is a 72 y.o. male whose past medical history and cardiovascular risk factors are: Nonobstructive coronary artery disease, severe coronary artery calcification 1101 AU, chronic HFpEF/stage C/NYHA class II, COVID infection (08/2021), prediabetes, hyperlipidemia, hypertension, advanced age, former smoker, obesity due to excess calories.  Patient is accompanied by his wife [name] at today's visit.  Patient is being followed by the practice given his severe CAC and HFpEF.  He presents today for 74-month follow-up visit.  Denies anginal chest pain or heart failure symptoms.  Patient is scheduled to have back surgery in the upcoming Monday with Dr. Noel Gerold. He is being considered for L4-L5, L5-S1 Transforaminal lumbar interbody fusion with instrumentation.   FUNCTIONAL STATUS: Walking more with grandkids, as per patient's wife.  ALLERGIES: No Known Allergies  MEDICATION LIST PRIOR TO VISIT: Current Meds  Medication Sig   abiraterone acetate (ZYTIGA) 250 MG tablet Take 4 tablets (1,000 mg total) by mouth daily. Take on an empty stomach 1 hour before or 2 hours after a meal   aspirin EC 81 MG tablet Take 1 tablet (81 mg total) by mouth daily.   atorvastatin (LIPITOR) 40 MG tablet TAKE ONE TABLET BY MOUTH (40 MG total) DAILY   diltiazem (CARDIZEM CD) 360 MG 24 hr capsule TAKE ONE CAPSULE BY MOUTH ONCE DAILY   empagliflozin (JARDIANCE) 10 MG TABS tablet Take 1 tablet (10 mg total) by mouth daily before breakfast.   lidocaine (LIDODERM) 5 % Place 1 patch onto the skin daily.   predniSONE (DELTASONE) 5 MG tablet TAKE 1 Tablet BY MOUTH ONCE DAILY  with BREAKFAST   pregabalin (LYRICA) 75 MG capsule Take 75 mg by mouth daily.   sacubitril-valsartan (ENTRESTO) 49-51 MG Take 1 tablet by mouth 2 (two) times daily.   solifenacin (VESICARE) 5 MG tablet Take 5 mg by mouth daily.   tamsulosin (FLOMAX) 0.4 MG CAPS capsule Take 1 capsule (0.4 mg total) by mouth daily after supper. (Patient taking differently: Take 0.4 mg by mouth at bedtime.)   traMADol (ULTRAM) 50 MG tablet Take 50 mg by mouth 2 (two) times daily as needed for moderate pain.      PAST MEDICAL HISTORY: Past Medical History:  Diagnosis Date   Bronchitis    CHF (congestive heart failure) (HCC) 04/2020   A/C HFpEF   Coronary artery calcification of native artery    Family history of pancreatic cancer    History of kidney stones    Hyperlipidemia    Hypertension    Pneumonia    Prostate cancer (HCC)     PAST SURGICAL HISTORY: Past Surgical History:  Procedure Laterality Date   EXTRACORPOREAL SHOCK WAVE LITHOTRIPSY Right 04/17/2017   Procedure: RIGHT EXTRACORPOREAL SHOCK WAVE LITHOTRIPSY (ESWL);  Surgeon: Ihor Gully, MD;  Location: [facility];  Service: Urology;  Laterality: Right;   HERNIA REPAIR     age 15   LEFT HEART CATH AND CORONARY ANGIOGRAPHY N/A 05/08/2020   Procedure: LEFT HEART CATH AND CORONARY ANGIOGRAPHY;  Surgeon: Yates Decamp, MD;  Location: [facility] INVASIVE CV LAB;  Service: Cardiovascular;  Laterality: N/A;   PROSTATE BIOPSY      FAMILY HISTORY:  The patient family history includes Breast cancer in his maternal aunt; Breast cancer (age of onset: 63) in his sister; Kidney cancer in his maternal aunt; Pancreatic cancer (age of onset: 32) in his brother.  SOCIAL HISTORY:  The patient  reports that he quit smoking about 11 years ago. His smoking use included cigarettes. He has a 2.50 pack-year smoking history. He has never used smokeless tobacco. He reports current alcohol use. He reports that he does not use drugs.  REVIEW OF SYSTEMS: Review of Systems   Cardiovascular:  Negative for chest pain, claudication, dyspnea on exertion, irregular heartbeat, leg swelling, near-syncope, orthopnea, palpitations, paroxysmal nocturnal dyspnea and syncope.  Respiratory:  Positive for shortness of breath (chronic and stable).   Hematologic/Lymphatic: Negative for bleeding problem.  Musculoskeletal:  Negative for muscle cramps and myalgias.  Neurological:  Negative for dizziness and light-headedness.    PHYSICAL EXAM:    02/25/2023   11:43 AM 02/05/2023   10:48 AM 12/31/2022   11:32 AM  Vitals with BMI  Height 5\' 6"  5\' 6"  5\' 6"   Weight 239 lbs 10 oz 240 lbs 239 lbs  BMI 38.69 38.76 38.59  Systolic 122 121 540  Diastolic 74 56 58  Pulse 66 63 69   Physical Exam  Constitutional: No distress.  Age appropriate, hemodynamically stable.   Neck: No JVD present.  Cardiovascular: Normal rate, regular rhythm, S1 normal, S2 normal, intact distal pulses and normal pulses. Exam reveals no gallop, no S3 and no S4.  No murmur heard. Pulmonary/Chest: Effort normal and breath sounds normal. No stridor. He has no wheezes. He has no rales.  Abdominal: Soft. Bowel sounds are normal. He exhibits no distension. There is no abdominal tenderness.  Abdominal obesity  Musculoskeletal:        General: No edema.     Cervical back: Neck supple.  Neurological: He is alert and oriented to person, place, and time. He has intact cranial nerves (2-12).  Skin: Skin is warm and moist.    RADIOLOGY: CT angio chest PE with and without contrast September 02, 2019:Cardiovascular: There is mild cardiomegaly. No pericardial effusion. There is multi vessel coronary vascular calcification. There is mild atherosclerotic calcification of the thoracic aorta.  CARDIAC DATABASE: EKG: February 25, 2023: Sinus rhythm, 63 bpm, first-degree AV block, nonspecific ST-T changes  Echocardiogram: 09/04/2022:  Normal LV systolic function with visual EF 60-65%. Left ventricle cavity is normal in  size. Normal left ventricular wall thickness. Normal global wall motion. Doppler evidence of grade II (pseudonormal) diastolic dysfunction, elevated LAP.  Mild tricuspid regurgitation. Mild pulmonary hypertension. RVSP measures 37 mmHg.  Proximal ascending aorta upper limit of normal, 37 mm.  Compared to 05/06/2020 otherwise no significant change.   Stress Testing: Lexiscan/modified Bruce Tetrofosmin stress test 02/23/2020: Stress EKG showed sinus tachycardia, inferolateral T wave inversion.  SPECT images show small sized, medium intensity, mid to basal inferolateral perfusion defect with mild reversibility. Stress LVEF 82%. Low risk study.  Coronary CTA 04/26/2020: 1. Coronary calcium score of 1101. This was 89th percentile for age and sex matched control. 2. Normal coronary origin with right dominance. 3.  Minimal calcified plaque within the left main artery. Moderate to severe stenosis within the proximal/mid LAD segment to calcified plaque. Severe stenosis at the ostial segment of the 1st diagonal branch due calcified plaque. Severe stenosis within the proximal segment due to eccentric calcified plaque within the LCX. Moderate stenosis within the distal RCA due to calcified plaque. Of note, severity of the stenosis  may be overestimated due to blooming artifact caused by calcified plaque. 4. CADRADS = 4. Cardiac catheterization or CT FFR is recommended. Consider symptom-guided anti-ischemic pharmacotherapy as well as risk factor modification per guideline directed care. Invasive coronary angiography recommended with revascularization per published guideline statements. 4. Study is sent for CT-FFR findings will be performed and reported   CT-FFR 04/27/2020: CT FFR analysis showed no significant stenosis.  Heart Catheterization: Left Heart Catheterization 05/08/20:  Hyperdynamic LVEF, EF 65 to 70%.  Mildly elevated LV EDP. Mild diffuse coronary artery disease with mild coronary  calcification involving LAD, circumflex and dominant RCA.  LAD is very small and gives origin to a very large LAD: D1 which reaches the apex. Recommendation: Patient symptoms are related to acute diastolic heart failure with elevated LVEDP.  LABORATORY DATA:    Latest Ref Rng & Units 02/05/2023   10:31 AM 12/20/2022    1:09 PM 08/22/2022    2:00 PM  CBC  WBC 4.0 - 10.5 K/uL 8.3  10.9  10.4   Hemoglobin 13.0 - 17.0 g/dL 09.8  11.9  14.7   Hematocrit 39.0 - 52.0 % 41.9  44.9  35.6   Platelets 150 - 400 K/uL 250  276  385        Latest Ref Rng & Units 12/20/2022    1:09 PM 08/30/2022   10:11 AM 08/22/2022    2:00 PM  CMP  Glucose 70 - 99 mg/dL 829  93  562   BUN 8 - 23 mg/dL 15  17  17    Creatinine 0.61 - 1.24 mg/dL 1.30  8.65  7.84   Sodium 135 - 145 mmol/L 139  140  142   Potassium 3.5 - 5.1 mmol/L 4.4  4.2  4.0   Chloride 98 - 111 mmol/L 105  106  108   CO2 22 - 32 mmol/L 24   28   Calcium 8.9 - 10.3 mg/dL 69.6  9.4  9.5   Total Protein 6.5 - 8.1 g/dL 7.4  6.0  6.1   Total Bilirubin 0.3 - 1.2 mg/dL 0.5  0.5  0.4   Alkaline Phos 38 - 126 U/L 95  111  106   AST 15 - 41 U/L 13  10  10    ALT 0 - 44 U/L 11   9    Hepatic Function Panel Recent Labs    04/16/22 1009 08/22/22 1400 08/30/22 1011 12/20/22 1309  PROT 6.2* 6.1* 6.0 7.4  ALBUMIN 3.9 3.9 4.1 4.6  AST 9* 10* 10 13*  ALT 10 9  --  11  ALKPHOS 83 106 111 95  BILITOT 0.5 0.4 0.5 0.5   Lab Results  Component Value Date   CHOL 122 08/30/2022   HDL 63 08/30/2022   LDLCALC 41 08/30/2022   LDLDIRECT 48 08/30/2022   TRIG 99 08/30/2022   External Labs: Lipid Panel 12/30/2019: total 155, Triglycerides 167, HDL 44, LDL 78  12/30/2019: A1C 5.4% Glucose Random 115 BUN 14, Creatinine, Serum 0.9  03/19/2019: PSA normal   06/05/2018: TSH 1.000  IMPRESSION:    ICD-10-CM   1. Preop cardiovascular exam  Z01.810     2. Chronic heart failure with preserved ejection fraction (HFpEF) (HCC)  I50.32 EKG 12-Lead    3.  Nonobstructive atherosclerosis of coronary artery  I25.10     4. Mixed hyperlipidemia  E78.2     5. Benign hypertension  I10     6. Obstructive sleep apnea  G47.33  7. Former smoker  Z87.891        RECOMMENDATIONS: Bobby Randel. is a 72 y.o. male whose past medical history and cardiac risk factors include: Nonobstructive coronary artery disease, severe coronary artery calcification 1101 AU, chronic HFpEF/stage C/NYHA class II, prediabetes, hyperlipidemia, hypertension, advanced age, former smoker, obesity due to excess calories.  Preoperative risk stratification Denies anginal chest pain or heart failure symptoms. Patient does have history of chronic HFpEF but he is optimized with regards to symptom and volume. Currently on guideline directed medical therapy at the maximally tolerated doses. EKG is nonischemic. Patient has an upcoming preoperative appointment later today. Recommend holding aspirin for 7 days and Jardiance for 3 days prior to surgery Would like to see him back in close follow-up postsurgery  Chronic heart failure with preserved ejection fraction (HFpEF) (HCC) Stage C, NYHA class II. Euvolemic on physical examination. On maximally tolerated GDMT. Echocardiogram from December 2023 notes preserved LVEF, grade 2 diastolic dysfunction, elevated LAP, no significant change compared to prior.  See report for additional details  Nonobstructive atherosclerosis of coronary artery Currently on antiplatelet therapy and statins. Denies angina pectoris. EKG nonischemic. Secondary prevention discussed with patient and wife at today's office visit. Plan was discussed initiation Wegovy be but due to the upcoming surgery we will hold off.  Mixed hyperlipidemia Currently on atorvastatin.   He denies myalgia or other side effects.  Benign hypertension Office blood pressures are well-controlled. Medications reconciled. No changes warranted at this  time  Obstructive sleep apnea Patient is a compliant with CPAP use on a regular basis.  FINAL MEDICATION LIST END OF ENCOUNTER: No orders of the defined types were placed in this encounter.    Current Outpatient Medications:    abiraterone acetate (ZYTIGA) 250 MG tablet, Take 4 tablets (1,000 mg total) by mouth daily. Take on an empty stomach 1 hour before or 2 hours after a meal, Disp: 120 tablet, Rfl: 2   aspirin EC 81 MG tablet, Take 1 tablet (81 mg total) by mouth daily., Disp: 90 tablet, Rfl: 3   atorvastatin (LIPITOR) 40 MG tablet, TAKE ONE TABLET BY MOUTH (40 MG total) DAILY, Disp: 90 tablet, Rfl: PRN   diltiazem (CARDIZEM CD) 360 MG 24 hr capsule, TAKE ONE CAPSULE BY MOUTH ONCE DAILY, Disp: 90 capsule, Rfl: 1   empagliflozin (JARDIANCE) 10 MG TABS tablet, Take 1 tablet (10 mg total) by mouth daily before breakfast., Disp: 90 tablet, Rfl: 3   lidocaine (LIDODERM) 5 %, Place 1 patch onto the skin daily., Disp: , Rfl:    predniSONE (DELTASONE) 5 MG tablet, TAKE 1 Tablet BY MOUTH ONCE DAILY with BREAKFAST, Disp: 90 tablet, Rfl: 1   pregabalin (LYRICA) 75 MG capsule, Take 75 mg by mouth daily., Disp: , Rfl:    sacubitril-valsartan (ENTRESTO) 49-51 MG, Take 1 tablet by mouth 2 (two) times daily., Disp: 180 tablet, Rfl: 3   solifenacin (VESICARE) 5 MG tablet, Take 5 mg by mouth daily., Disp: , Rfl:    tamsulosin (FLOMAX) 0.4 MG CAPS capsule, Take 1 capsule (0.4 mg total) by mouth daily after supper. (Patient taking differently: Take 0.4 mg by mouth at bedtime.), Disp: 30 capsule, Rfl: 0   traMADol (ULTRAM) 50 MG tablet, Take 50 mg by mouth 2 (two) times daily as needed for moderate pain. , Disp: , Rfl:   Orders Placed This Encounter  Procedures   EKG 12-Lead    --Continue cardiac medications as reconciled in final medication list. --Return in about 3  months (around 05/28/2023) for Follow up HFpEF. Or sooner if needed. --Continue follow-up with your primary care physician regarding the  management of your other chronic comorbid conditions.  Patient's questions and concerns were addressed to his satisfaction. He voices understanding of the instructions provided during this encounter.   This note was created using a voice recognition software as a result there may be grammatical errors inadvertently enclosed that do not reflect the nature of this encounter. Every attempt is made to correct such errors.  Tessa Lerner, [place], [facility]  Pager:  [phone] Office: [phone]

## 2023-03-03 DIAGNOSIS — Z4789 Encounter for other orthopedic aftercare: Secondary | ICD-10-CM | POA: Diagnosis not present

## 2023-03-03 DIAGNOSIS — M4726 Other spondylosis with radiculopathy, lumbar region: Secondary | ICD-10-CM | POA: Diagnosis not present

## 2023-03-03 DIAGNOSIS — M532X7 Spinal instabilities, lumbosacral region: Secondary | ICD-10-CM | POA: Diagnosis not present

## 2023-03-03 DIAGNOSIS — M2578 Osteophyte, vertebrae: Secondary | ICD-10-CM | POA: Diagnosis not present

## 2023-03-03 DIAGNOSIS — M4310 Spondylolisthesis, site unspecified: Secondary | ICD-10-CM | POA: Diagnosis not present

## 2023-03-03 DIAGNOSIS — M48061 Spinal stenosis, lumbar region without neurogenic claudication: Secondary | ICD-10-CM | POA: Diagnosis not present

## 2023-03-03 DIAGNOSIS — Q762 Congenital spondylolisthesis: Secondary | ICD-10-CM | POA: Diagnosis not present

## 2023-03-03 DIAGNOSIS — Z741 Need for assistance with personal care: Secondary | ICD-10-CM | POA: Diagnosis not present

## 2023-03-03 DIAGNOSIS — Z981 Arthrodesis status: Secondary | ICD-10-CM | POA: Diagnosis not present

## 2023-03-03 DIAGNOSIS — M4316 Spondylolisthesis, lumbar region: Secondary | ICD-10-CM | POA: Diagnosis not present

## 2023-03-03 DIAGNOSIS — M4317 Spondylolisthesis, lumbosacral region: Secondary | ICD-10-CM | POA: Diagnosis not present

## 2023-03-03 DIAGNOSIS — R269 Unspecified abnormalities of gait and mobility: Secondary | ICD-10-CM | POA: Diagnosis not present

## 2023-03-03 DIAGNOSIS — M47816 Spondylosis without myelopathy or radiculopathy, lumbar region: Secondary | ICD-10-CM | POA: Diagnosis not present

## 2023-03-03 DIAGNOSIS — M48062 Spinal stenosis, lumbar region with neurogenic claudication: Secondary | ICD-10-CM | POA: Diagnosis not present

## 2023-03-03 DIAGNOSIS — M532X6 Spinal instabilities, lumbar region: Secondary | ICD-10-CM | POA: Diagnosis not present

## 2023-03-03 DIAGNOSIS — R2681 Unsteadiness on feet: Secondary | ICD-10-CM | POA: Diagnosis not present

## 2023-03-03 DIAGNOSIS — M5416 Radiculopathy, lumbar region: Secondary | ICD-10-CM | POA: Diagnosis not present

## 2023-03-04 DIAGNOSIS — R2681 Unsteadiness on feet: Secondary | ICD-10-CM | POA: Diagnosis not present

## 2023-03-04 DIAGNOSIS — R269 Unspecified abnormalities of gait and mobility: Secondary | ICD-10-CM | POA: Diagnosis not present

## 2023-03-04 DIAGNOSIS — Z981 Arthrodesis status: Secondary | ICD-10-CM | POA: Diagnosis not present

## 2023-03-04 DIAGNOSIS — M4726 Other spondylosis with radiculopathy, lumbar region: Secondary | ICD-10-CM | POA: Diagnosis not present

## 2023-03-04 DIAGNOSIS — Z741 Need for assistance with personal care: Secondary | ICD-10-CM | POA: Diagnosis not present

## 2023-03-04 DIAGNOSIS — R6889 Other general symptoms and signs: Secondary | ICD-10-CM | POA: Insufficient documentation

## 2023-03-04 DIAGNOSIS — M48062 Spinal stenosis, lumbar region with neurogenic claudication: Secondary | ICD-10-CM | POA: Diagnosis not present

## 2023-03-04 DIAGNOSIS — M2578 Osteophyte, vertebrae: Secondary | ICD-10-CM | POA: Diagnosis not present

## 2023-03-04 DIAGNOSIS — M4317 Spondylolisthesis, lumbosacral region: Secondary | ICD-10-CM | POA: Diagnosis not present

## 2023-03-04 DIAGNOSIS — M4310 Spondylolisthesis, site unspecified: Secondary | ICD-10-CM | POA: Diagnosis not present

## 2023-03-04 DIAGNOSIS — M47816 Spondylosis without myelopathy or radiculopathy, lumbar region: Secondary | ICD-10-CM | POA: Diagnosis not present

## 2023-03-04 DIAGNOSIS — M532X7 Spinal instabilities, lumbosacral region: Secondary | ICD-10-CM | POA: Diagnosis not present

## 2023-03-05 ENCOUNTER — Other Ambulatory Visit (HOSPITAL_COMMUNITY): Payer: Self-pay

## 2023-03-10 ENCOUNTER — Other Ambulatory Visit (HOSPITAL_COMMUNITY): Payer: Self-pay

## 2023-04-01 DIAGNOSIS — G894 Chronic pain syndrome: Secondary | ICD-10-CM | POA: Diagnosis not present

## 2023-04-01 DIAGNOSIS — M4316 Spondylolisthesis, lumbar region: Secondary | ICD-10-CM | POA: Diagnosis not present

## 2023-04-01 DIAGNOSIS — Z79891 Long term (current) use of opiate analgesic: Secondary | ICD-10-CM | POA: Diagnosis not present

## 2023-04-03 ENCOUNTER — Other Ambulatory Visit: Payer: Self-pay

## 2023-04-04 ENCOUNTER — Other Ambulatory Visit: Payer: Self-pay | Admitting: Oncology

## 2023-04-04 ENCOUNTER — Other Ambulatory Visit (HOSPITAL_COMMUNITY): Payer: Self-pay

## 2023-04-04 ENCOUNTER — Other Ambulatory Visit: Payer: Self-pay

## 2023-04-04 DIAGNOSIS — C61 Malignant neoplasm of prostate: Secondary | ICD-10-CM

## 2023-04-04 MED ORDER — ABIRATERONE ACETATE 250 MG PO TABS
1000.0000 mg | ORAL_TABLET | Freq: Every day | ORAL | 2 refills | Status: DC
Start: 2023-04-04 — End: 2023-08-18
  Filled 2023-04-04: qty 120, 30d supply, fill #0
  Filled 2023-05-07: qty 120, 30d supply, fill #1
  Filled 2023-06-27 – 2023-07-30 (×2): qty 120, 30d supply, fill #2

## 2023-04-07 ENCOUNTER — Other Ambulatory Visit: Payer: Self-pay

## 2023-04-23 ENCOUNTER — Inpatient Hospital Stay: Payer: Medicare PPO | Admitting: Oncology

## 2023-04-23 ENCOUNTER — Telehealth: Payer: Self-pay | Admitting: *Deleted

## 2023-04-23 ENCOUNTER — Inpatient Hospital Stay: Payer: Medicare PPO | Attending: Oncology

## 2023-04-23 ENCOUNTER — Inpatient Hospital Stay: Payer: Medicare PPO

## 2023-04-23 VITALS — BP 134/74 | HR 75 | Temp 98.1°F | Resp 18 | Wt 240.0 lb

## 2023-04-23 DIAGNOSIS — D509 Iron deficiency anemia, unspecified: Secondary | ICD-10-CM

## 2023-04-23 DIAGNOSIS — C778 Secondary and unspecified malignant neoplasm of lymph nodes of multiple regions: Secondary | ICD-10-CM | POA: Diagnosis not present

## 2023-04-23 DIAGNOSIS — C61 Malignant neoplasm of prostate: Secondary | ICD-10-CM | POA: Diagnosis not present

## 2023-04-23 DIAGNOSIS — Z5111 Encounter for antineoplastic chemotherapy: Secondary | ICD-10-CM | POA: Insufficient documentation

## 2023-04-23 DIAGNOSIS — M4316 Spondylolisthesis, lumbar region: Secondary | ICD-10-CM | POA: Diagnosis not present

## 2023-04-23 LAB — FERRITIN: Ferritin: 12 ng/mL — ABNORMAL LOW (ref 24–336)

## 2023-04-23 LAB — CMP (CANCER CENTER ONLY)
ALT: 6 U/L (ref 0–44)
AST: 9 U/L — ABNORMAL LOW (ref 15–41)
Albumin: 4 g/dL (ref 3.5–5.0)
Alkaline Phosphatase: 108 U/L (ref 38–126)
Anion gap: 9 (ref 5–15)
BUN: 19 mg/dL (ref 8–23)
CO2: 25 mmol/L (ref 22–32)
Calcium: 9.2 mg/dL (ref 8.9–10.3)
Chloride: 106 mmol/L (ref 98–111)
Creatinine: 0.99 mg/dL (ref 0.61–1.24)
GFR, Estimated: >60 mL/min (ref >60)
Glucose, Bld: 103 mg/dL — ABNORMAL HIGH (ref 70–99)
Potassium: 3.7 mmol/L (ref 3.5–5.1)
Sodium: 140 mmol/L (ref 135–145)
Total Bilirubin: 0.4 mg/dL (ref 0.3–1.2)
Total Protein: 6.6 g/dL (ref 6.5–8.1)

## 2023-04-23 LAB — CBC WITH DIFFERENTIAL (CANCER CENTER ONLY)
Abs Immature Granulocytes: 0.02 10*3/uL (ref 0.00–0.07)
Basophils Absolute: 0.1 10*3/uL (ref 0.0–0.1)
Basophils Relative: 1 %
Eosinophils Absolute: 0.2 10*3/uL (ref 0.0–0.5)
Eosinophils Relative: 3 %
HCT: 39.2 % (ref 39.0–52.0)
Hemoglobin: 12.4 g/dL — ABNORMAL LOW (ref 13.0–17.0)
Immature Granulocytes: 0 %
Lymphocytes Relative: 14 %
Lymphs Abs: 1.1 10*3/uL (ref 0.7–4.0)
MCH: 24.5 pg — ABNORMAL LOW (ref 26.0–34.0)
MCHC: 31.6 g/dL (ref 30.0–36.0)
MCV: 77.5 fL — ABNORMAL LOW (ref 80.0–100.0)
Monocytes Absolute: 0.5 10*3/uL (ref 0.1–1.0)
Monocytes Relative: 7 %
Neutro Abs: 5.6 10*3/uL (ref 1.7–7.7)
Neutrophils Relative %: 75 %
Platelet Count: 245 10*3/uL (ref 150–400)
RBC: 5.06 MIL/uL (ref 4.22–5.81)
RDW: 16.6 % — ABNORMAL HIGH (ref 11.5–15.5)
WBC Count: 7.5 10*3/uL (ref 4.0–10.5)
nRBC: 0 % (ref 0.0–0.2)

## 2023-04-23 MED ORDER — LEUPROLIDE ACETATE (4 MONTH) 30 MG ~~LOC~~ KIT
30.0000 mg | PACK | Freq: Once | SUBCUTANEOUS | Status: AC
  Administered 2023-04-23: 30 mg via SUBCUTANEOUS
  Filled 2023-04-23: qty 30

## 2023-04-23 MED ORDER — FERROUS SULFATE 325 (65 FE) MG PO TBEC: 325.0000 mg | DELAYED_RELEASE_TABLET | Freq: Two times a day (BID) | ORAL | Status: DC

## 2023-04-23 NOTE — Telephone Encounter (Signed)
Called referral coordinator to f/u on referral to GI. Was informed that provider he was scheduled with is  no longer working there. Patient will be called this week to reschedule.

## 2023-04-23 NOTE — Patient Instructions (Signed)

## 2023-04-23 NOTE — Progress Notes (Signed)
  [facility] OFFICE PROGRESS NOTE   Diagnosis: Prostate cancer  INTERVAL HISTORY:   Bobby. Longs returns as scheduled.  He continues abiraterone.  He is due for Lupron today.  No hot flashes.  No rash or diarrhea.  No new site of pain. He underwent lumbar spine surgery in June.  He reports lower back pain is no better.  He is scheduled to begin a steroid Dosepak tomorrow.  He is followed by orthopedics at [facility]. He was scheduled to see gastroenterology for evaluation of iron deficiency, but reports the appointment was canceled.  Objective:  Vital signs in last 24 hours:  Blood pressure 134/74, pulse 75, temperature 98.1 F (36.7 C), temperature source Oral, resp. rate 18, weight 240 lb (108.9 kg), SpO2 99%.    Lymphatics: No cervical, supraclavicular, axillary, or inguinal nodes Resp: Lungs clear bilaterally Cardio: Regular rate and rhythm GI: Nontender, no mass, no hepatosplenomegaly Vascular: No leg edema  Skin: Healed lower back incision  Lab Results:  Lab Results  Component Value Date   WBC 7.5 04/23/2023   HGB 12.4 (L) 04/23/2023   HCT 39.2 04/23/2023   MCV 77.5 (L) 04/23/2023   PLT 245 04/23/2023   NEUTROABS 5.6 04/23/2023    CMP  Lab Results  Component Value Date   NA 140 04/23/2023   K 3.7 04/23/2023   CL 106 04/23/2023   CO2 25 04/23/2023   GLUCOSE 103 (H) 04/23/2023   BUN 19 04/23/2023   CREATININE 0.99 04/23/2023   CALCIUM 9.2 04/23/2023   PROT 6.6 04/23/2023   ALBUMIN 4.0 04/23/2023   AST 9 (L) 04/23/2023   ALT 6 04/23/2023   ALKPHOS 108 04/23/2023   BILITOT 0.4 04/23/2023   GFRNONAA >60 04/23/2023   GFRAA 81 09/29/2020     Medications: I have reviewed the patient's current medications.   Assessment/Plan: Prostate cancer-Gleason 4+5 = 9, PSA 14.15 April 2017 Negative bone scan September 2018 CT September 2018-diverticulitis in the sigmoid colon, enlarged retroperitoneal and pelvic lymph nodes Evaluated in the  multidisciplinary prostate cancer clinic and considered to have metastatic disease, radiation not recommended Started treatment with leuprolide and abiraterone/prednisone (October 2018) CT abdomen/pelvis 12/20/2021- acute sigmoid diverticulitis, no adenopathy  2.  CHF 3.  Hyperlipidemia 4.  Lumbar disc disease 5.  Iron deficiency anemia August 2023 Low ferritin 12/20/2022, 02/05/2023 01/20/2023-stool Hemoccult cards negative Colonoscopy 07/09/2018-polyps removed from the ascending and transverse colon-tubular adenomas 6.  History of Kidney stones 7.  Hypertension 8.  L4-S1 laminectomy and fusion 03/03/2023    Disposition: Bobby [name] is in clinical remission from prostate cancer.  We will follow-up on the PSA from today.  He will continue abiraterone and leuprolide.  He will receive a leuprolide injection today. He is recovering from lumbar spine surgery formed in June.  He will continue follow-up with orthopedics for management of persistent pain.  He has iron deficiency anemia.  We referred him to gastroenterology in May, but he has not yet been evaluated.  We will recontact GI today.  We will check a urinalysis today.  He will begin ferrous sulfate twice daily.  Bobby Ritsema will return for an office visit in 2 months.  Thornton Papas, MD  04/23/2023  12:02 PM

## 2023-04-24 LAB — PROSTATE-SPECIFIC AG, SERUM (LABCORP): Prostate Specific Ag, Serum: <0.1 ng/mL (ref 0.0–4.0)

## 2023-05-06 ENCOUNTER — Other Ambulatory Visit (HOSPITAL_COMMUNITY): Payer: Self-pay

## 2023-05-07 ENCOUNTER — Other Ambulatory Visit: Payer: Self-pay

## 2023-05-16 ENCOUNTER — Other Ambulatory Visit: Payer: Self-pay | Admitting: Orthopedic Surgery

## 2023-05-16 DIAGNOSIS — M4316 Spondylolisthesis, lumbar region: Secondary | ICD-10-CM

## 2023-05-20 ENCOUNTER — Ambulatory Visit
Admission: RE | Admit: 2023-05-20 | Discharge: 2023-05-20 | Disposition: A | Discharge status: Home / Self Care | Payer: Medicare PPO | Source: Ambulatory Visit | Attending: Orthopedic Surgery | Admitting: Orthopedic Surgery

## 2023-05-20 DIAGNOSIS — M4316 Spondylolisthesis, lumbar region: Secondary | ICD-10-CM

## 2023-05-20 DIAGNOSIS — I700 Atherosclerosis of aorta: Secondary | ICD-10-CM | POA: Diagnosis not present

## 2023-05-20 DIAGNOSIS — M545 Low back pain, unspecified: Secondary | ICD-10-CM | POA: Diagnosis not present

## 2023-05-28 ENCOUNTER — Telehealth: Payer: Self-pay | Admitting: Cardiology

## 2023-05-28 DIAGNOSIS — M4316 Spondylolisthesis, lumbar region: Secondary | ICD-10-CM | POA: Diagnosis not present

## 2023-05-28 NOTE — Telephone Encounter (Signed)
Called to r/s appt on 09/27 with Dr. Odis Hollingshead due to merge with [facility]. Patient states that he wants to wait to see Dr. Odis Hollingshead, advised pt to c/b after 06/10/23 and schedules should be finalized

## 2023-05-30 ENCOUNTER — Ambulatory Visit: Payer: Medicare PPO | Admitting: Cardiology

## 2023-06-06 ENCOUNTER — Ambulatory Visit: Payer: Medicare PPO | Admitting: Cardiology

## 2023-06-09 DIAGNOSIS — M960 Pseudarthrosis after fusion or arthrodesis: Secondary | ICD-10-CM | POA: Insufficient documentation

## 2023-06-10 ENCOUNTER — Telehealth: Payer: Self-pay | Admitting: *Deleted

## 2023-06-10 ENCOUNTER — Other Ambulatory Visit: Payer: Self-pay | Admitting: Cardiology

## 2023-06-10 ENCOUNTER — Other Ambulatory Visit (HOSPITAL_COMMUNITY): Payer: Self-pay

## 2023-06-10 NOTE — Telephone Encounter (Signed)
Patient left VM requesting a reschedule of his 10/14 appointment with Dr. Truett Perna. Forwarded message to scheduler.

## 2023-06-11 DIAGNOSIS — M5416 Radiculopathy, lumbar region: Secondary | ICD-10-CM | POA: Diagnosis not present

## 2023-06-13 DIAGNOSIS — M960 Pseudarthrosis after fusion or arthrodesis: Secondary | ICD-10-CM | POA: Diagnosis not present

## 2023-06-20 ENCOUNTER — Other Ambulatory Visit: Payer: Self-pay | Admitting: Oncology

## 2023-06-23 ENCOUNTER — Inpatient Hospital Stay: Payer: Medicare PPO | Admitting: Oncology

## 2023-06-23 ENCOUNTER — Inpatient Hospital Stay: Payer: Medicare PPO

## 2023-06-23 ENCOUNTER — Ambulatory Visit: Payer: Medicare PPO | Attending: Cardiology | Admitting: Cardiology

## 2023-06-23 VITALS — BP 102/58 | HR 67 | Resp 16 | Ht 66.0 in | Wt 240.0 lb

## 2023-06-23 DIAGNOSIS — Z6838 Body mass index (BMI) 38.0-38.9, adult: Secondary | ICD-10-CM | POA: Diagnosis not present

## 2023-06-23 DIAGNOSIS — I10 Essential (primary) hypertension: Secondary | ICD-10-CM

## 2023-06-23 DIAGNOSIS — I2510 Atherosclerotic heart disease of native coronary artery without angina pectoris: Secondary | ICD-10-CM

## 2023-06-23 DIAGNOSIS — E66812 Obesity, class 2: Secondary | ICD-10-CM

## 2023-06-23 DIAGNOSIS — E782 Mixed hyperlipidemia: Secondary | ICD-10-CM | POA: Diagnosis not present

## 2023-06-23 DIAGNOSIS — I5032 Chronic diastolic (congestive) heart failure: Secondary | ICD-10-CM | POA: Diagnosis not present

## 2023-06-23 DIAGNOSIS — G4733 Obstructive sleep apnea (adult) (pediatric): Secondary | ICD-10-CM | POA: Diagnosis not present

## 2023-06-23 DIAGNOSIS — Z87891 Personal history of nicotine dependence: Secondary | ICD-10-CM

## 2023-06-23 NOTE — Patient Instructions (Signed)
Medication Instructions:  Your physician recommends that you continue on your current medications as directed. Please refer to the Current Medication list given to you today.  *If you need a refill on your cardiac medications before your next appointment, please call your pharmacy*  Lab Work: None ordered If you have labs (blood work) drawn today and your tests are completely normal, you will receive your results only by: MyChart Message (if you have MyChart) OR A paper copy in the mail If you have any lab test that is abnormal or we need to change your treatment, we will call you to review the results.  Testing/Procedures: None ordered  Follow-Up: At [facility], you and your health needs are our priority.  As part of our continuing mission to provide you with exceptional heart care, we have created designated Provider Care Teams.  These Care Teams include your primary Cardiologist (physician) and Advanced Practice Providers (APPs -  Physician Assistants and Nurse Practitioners) who all work together to provide you with the care you need, when you need it.  We recommend signing up for the patient portal called "MyChart".  Sign up information is provided on this After Visit Summary.  MyChart is used to connect with patients for Virtual Visits (Telemedicine).  Patients are able to view lab/test results, encounter notes, upcoming appointments, etc.  Non-urgent messages can be sent to your provider as well.   To learn more about what you can do with MyChart, go to [email or web address].    Your next appointment:   6 month(s)  The format for your next appointment:   In Person  Provider:   Jari Favre, PA-C, Ronie Spies, PA-C, Robin Searing, NP, Eligha Bridegroom, NP, Tereso Newcomer, PA-C, or Perlie Gold, PA-C. Then, [employer], DO will plan to see you again in 1 year(s).{

## 2023-06-23 NOTE — Progress Notes (Unsigned)
Cardiology Office Note:  .   Date:  06/25/2023  ID:  Bobby [name]., DOB 07-25-1951, MRN [id number] PCP:  Adrian Prince, MD  Former Cardiology Providers: None [facility] Providers Cardiologist:  Tessa Lerner, DO , [facility] (established care 01/21/2020) Electrophysiologist:  None  Click to update primary MD,subspecialty MD or APP then REFRESH:1}    Chief Complaint  Patient presents with   Chronic heart failure with preserved ejection fraction   Follow-up    History of Present Illness: .   Bobby [name]. is a 72 y.o. Caucasian male whose past medical history and cardiovascular risk factors includes: Nonobstructive coronary artery disease, severe coronary artery calcification 1101 AU, chronic HFpEF/stage C/NYHA class II, COVID infection (08/2021), prediabetes, hyperlipidemia, hypertension, advanced age, former smoker, obesity due to excess calories.   Patient is being followed in the practice given his history of severe CAC.  He is accompanied by his wife [name] at today's office visit.   Since last office visit he denies anginal chest pain.  Shortness of breath remains relatively stable.  He denies heart failure symptoms.  Overall functional capacity has been limited due to recent back surgery.  Patient states that he did undergo trams foraminal lumbar interbody fusion with instrumentation since last office visit; however, he is scheduled for a redo surgery in the coming weeks.  Review of Systems: .   Review of Systems  Cardiovascular:  Negative for chest pain, claudication, dyspnea on exertion, irregular heartbeat, leg swelling, near-syncope, orthopnea, palpitations, paroxysmal nocturnal dyspnea and syncope.  Respiratory:  Positive for shortness of breath (chronic and stable).   Hematologic/Lymphatic: Negative for bleeding problem.  Musculoskeletal:  Positive for back pain. Negative for muscle cramps and myalgias.  Neurological:  Negative for dizziness and  light-headedness.    Studies Reviewed:   EKG: February 25, 2023: Sinus rhythm, 63 bpm, first-degree AV block, nonspecific ST-T changes   Echocardiogram: 09/04/2022:  Normal LV systolic function with visual EF 60-65%. Left ventricle cavity is normal in size. Normal left ventricular wall thickness. Normal global wall motion. Doppler evidence of grade II (pseudonormal) diastolic dysfunction, elevated LAP.  Mild tricuspid regurgitation. Mild pulmonary hypertension. RVSP measures 37 mmHg.  Proximal ascending aorta upper limit of normal, 37 mm.  Compared to 05/06/2020 otherwise no significant change.    Stress Testing: Lexiscan/modified Bruce Tetrofosmin stress test 02/23/2020: Stress EKG showed sinus tachycardia, inferolateral T wave inversion.  SPECT images show small sized, medium intensity, mid to basal inferolateral perfusion defect with mild reversibility. Stress LVEF 82%. Low risk study.   Coronary CTA 04/26/2020: 1. Coronary calcium score of 1101. This was 89th percentile for age and sex matched control. 2. Normal coronary origin with right dominance. 3.  Minimal calcified plaque within the left main artery. Moderate to severe stenosis within the proximal/mid LAD segment to calcified plaque. Severe stenosis at the ostial segment of the 1st diagonal branch due calcified plaque. Severe stenosis within the proximal segment due to eccentric calcified plaque within the LCX. Moderate stenosis within the distal RCA due to calcified plaque. Of note, severity of the stenosis may be overestimated due to blooming artifact caused by calcified plaque. 4. CADRADS = 4. Cardiac catheterization or CT FFR is recommended. Consider symptom-guided anti-ischemic pharmacotherapy as well as risk factor modification per guideline directed care. Invasive coronary angiography recommended with revascularization per published guideline statements. 4. Study is sent for CT-FFR findings will be performed and reported     CT-FFR 04/27/2020: CT FFR analysis showed no  significant stenosis.   Heart Catheterization: Left Heart Catheterization 05/08/20:  Hyperdynamic LVEF, EF 65 to 70%.  Mildly elevated LV EDP. Mild diffuse coronary artery disease with mild coronary calcification involving LAD, circumflex and dominant RCA.  LAD is very small and gives origin to a very large LAD: D1 which reaches the apex. Recommendation: Patient symptoms are related to acute diastolic heart failure with elevated LVEDP.  RADIOLOGY: NA  Risk Assessment/Calculations:   NA   Labs:       Latest Ref Rng & Units 04/23/2023   11:19 AM 02/05/2023   10:31 AM 12/20/2022    1:09 PM  CBC  WBC 4.0 - 10.5 K/uL 7.5  8.3  10.9   Hemoglobin 13.0 - 17.0 g/dL 16.1  09.6  04.5   Hematocrit 39.0 - 52.0 % 39.2  41.9  44.9   Platelets 150 - 400 K/uL 245  250  276        Latest Ref Rng & Units 04/23/2023   11:19 AM 12/20/2022    1:09 PM 08/30/2022   10:11 AM  BMP  Glucose 70 - 99 mg/dL 409  811  93   BUN 8 - 23 mg/dL 19  15  17    Creatinine 0.61 - 1.24 mg/dL 9.14  7.82  9.56   BUN/Creat Ratio 10 - 24   18   Sodium 135 - 145 mmol/L 140  139  140   Potassium 3.5 - 5.1 mmol/L 3.7  4.4  4.2   Chloride 98 - 111 mmol/L 106  105  106   CO2 22 - 32 mmol/L 25  24    Calcium 8.9 - 10.3 mg/dL 9.2  21.3  9.4       Latest Ref Rng & Units 04/23/2023   11:19 AM 12/20/2022    1:09 PM 08/30/2022   10:11 AM  CMP  Glucose 70 - 99 mg/dL 086  578  93   BUN 8 - 23 mg/dL 19  15  17    Creatinine 0.61 - 1.24 mg/dL 4.69  6.29  5.28   Sodium 135 - 145 mmol/L 140  139  140   Potassium 3.5 - 5.1 mmol/L 3.7  4.4  4.2   Chloride 98 - 111 mmol/L 106  105  106   CO2 22 - 32 mmol/L 25  24    Calcium 8.9 - 10.3 mg/dL 9.2  41.3  9.4   Total Protein 6.5 - 8.1 g/dL 6.6  7.4  6.0   Total Bilirubin 0.3 - 1.2 mg/dL 0.4  0.5  0.5   Alkaline Phos 38 - 126 U/L 108  95  111   AST 15 - 41 U/L 9  13  10    ALT 0 - 44 U/L 6  11      Lab Results  Component Value Date    CHOL 122 08/30/2022   HDL 63 08/30/2022   LDLCALC 41 08/30/2022   LDLDIRECT 48 08/30/2022   TRIG 99 08/30/2022   No results for input(s): "LIPOA" in the last 8760 hours. No components found for: "NTPROBNP" No results for input(s): "PROBNP" in the last 8760 hours. No results for input(s): "TSH" in the last 8760 hours.   Physical Exam:    Today's Vitals   06/23/23 0953  BP: (!) 102/58  Pulse: 67  Resp: 16  SpO2: 96%  Weight: 240 lb (108.9 kg)  Height: 5\' 6"  (1.676 m)   Body mass index is 38.74 kg/m. Wt Readings from Last 3 Encounters:  06/23/23 240 lb (108.9 kg)  04/23/23 240 lb (108.9 kg)  02/25/23 239 lb 9.6 oz (108.7 kg)    Physical Exam  Constitutional: No distress.  Age appropriate, hemodynamically stable.   Neck: No JVD present.  Cardiovascular: Normal rate, regular rhythm, S1 normal, S2 normal, intact distal pulses and normal pulses. Exam reveals no gallop, no S3 and no S4.  No murmur heard. Pulmonary/Chest: Effort normal and breath sounds normal. No stridor. He has no wheezes. He has no rales.  Abdominal: Soft. Bowel sounds are normal. He exhibits no distension. There is no abdominal tenderness.  Abdominal obesity  Musculoskeletal:        General: No edema.     Cervical back: Neck supple.  Neurological: He is alert and oriented to person, place, and time. He has intact cranial nerves (2-12).  Skin: Skin is warm and moist.    Impression & Recommendation(s):  Impression:   ICD-10-CM   1. Chronic heart failure with preserved ejection fraction (HFpEF) (HCC)  I50.32     2. Nonobstructive atherosclerosis of coronary artery  I25.10     3. Mixed hyperlipidemia  E78.2     4. Benign hypertension  I10     5. Obstructive sleep apnea  G47.33     6. Former smoker  Z87.891     7. Class 2 severe obesity due to excess calories with serious comorbidity and body mass index (BMI) of 38.0 to 38.9 in adult [facility])  Z61.096    E66.01    Z68.38         Recommendation(s):  Chronic heart failure with preserved ejection fraction (HFpEF) (HCC) Stage C, NYHA class II: Euvolemic on physical examination. Continue Jardiance 10 mg p.o. daily. Continue Entresto 49/51 mg p.o. twice daily. Continue Cardizem 360 mg p.o. daily. No additional medication titration warranted at this time as he is euvolemic and blood pressures are soft.  Patient is aware of the holding parameters that are on his Entresto.  Nonobstructive atherosclerosis of coronary artery Denies anginal chest pain. Continue antiplatelet therapy and statin therapy. Prior EKGs have been nonischemic. Reemphasized the importance of secondary prevention with focus on improving her modifiable cardiovascular risk factors such as glycemic control, lipid management, blood pressure control, weight loss.  Mixed hyperlipidemia Currently on atorvastatin 40 mg p.o. daily.   He denies myalgia or other side effects. Most recent lipids dated December 2023, independently reviewed as noted above. No recent labs available for review, patient plans to have them done in December 2024 and will send [place] a copy for reference  Benign hypertension Office blood pressures are soft. Medications reconciled. He is aware of the holding parameters on his heart failure medications.  If he ends up holding Entresto quite often will have to reduce his dose patient and wife are agreeable  Obstructive sleep apnea Compliant with CPAP on a regular basis  Patient is being considered for redo surgery.  From a cardiovascular standpoint he is overall acceptable risk for upcoming noncardiac surgery.  I have asked him to hold Jardiance 3 days prior to his surgery.  Orders Placed:  No orders of the defined types were placed in this encounter.  Final Medication List:   No orders of the defined types were placed in this encounter.   Medications Discontinued During This Encounter  Medication Reason   ferrous sulfate 325 (65  FE) MG EC tablet      Current Outpatient Medications:    abiraterone acetate (ZYTIGA) 250 MG tablet, Take 4 tablets (  1,000 mg total) by mouth daily. Take on an empty stomach 1 hour before or 2 hours after a meal, Disp: 120 tablet, Rfl: 2   aspirin EC 81 MG tablet, Take 1 tablet (81 mg total) by mouth daily., Disp: 90 tablet, Rfl: 3   atorvastatin (LIPITOR) 40 MG tablet, TAKE ONE TABLET BY MOUTH (40 MG total) DAILY, Disp: 90 tablet, Rfl: 0   diltiazem (CARDIZEM CD) 360 MG 24 hr capsule, TAKE ONE CAPSULE BY MOUTH ONCE DAILY, Disp: 90 capsule, Rfl: 1   empagliflozin (JARDIANCE) 10 MG TABS tablet, Take 1 tablet (10 mg total) by mouth daily before breakfast., Disp: 90 tablet, Rfl: 3   HYDROcodone-acetaminophen (NORCO/VICODIN) 5-325 MG tablet, Take 1 tablet by mouth every 4 (four) hours as needed., Disp: , Rfl:    lidocaine (LIDODERM) 5 %, Place 1 patch onto the skin as needed., Disp: , Rfl:    methocarbamol (ROBAXIN) 500 MG tablet, Take 500 mg by mouth 3 (three) times daily., Disp: , Rfl:    predniSONE (DELTASONE) 5 MG tablet, TAKE 1 Tablet BY MOUTH ONCE DAILY with BREAKFAST, Disp: 90 tablet, Rfl: 3   pregabalin (LYRICA) 75 MG capsule, Take 75 mg by mouth daily., Disp: , Rfl:    sacubitril-valsartan (ENTRESTO) 49-51 MG, Take 1 tablet by mouth 2 (two) times daily., Disp: 180 tablet, Rfl: 3   solifenacin (VESICARE) 5 MG tablet, Take 5 mg by mouth daily., Disp: , Rfl:    tamsulosin (FLOMAX) 0.4 MG CAPS capsule, Take 1 capsule (0.4 mg total) by mouth daily after supper. (Patient taking differently: Take 0.4 mg by mouth at bedtime.), Disp: 30 capsule, Rfl: 0   traMADol (ULTRAM) 50 MG tablet, Take 50 mg by mouth 2 (two) times daily as needed for moderate pain., Disp: , Rfl:   Consent:   N/A  Disposition:   Follow-up in 6 months with APP and myself in a year.   His questions and concerns were addressed to his satisfaction. He voices understanding of the recommendations provided during this encounter.     Signed, Tessa Lerner, DO, [facility]  [facility]  [address] [place], [place] [address] 06/25/2023 7:13 PM

## 2023-06-24 DIAGNOSIS — M960 Pseudarthrosis after fusion or arthrodesis: Secondary | ICD-10-CM | POA: Diagnosis not present

## 2023-06-25 ENCOUNTER — Encounter: Payer: Self-pay | Admitting: Cardiology

## 2023-06-26 ENCOUNTER — Other Ambulatory Visit: Payer: Self-pay

## 2023-06-27 ENCOUNTER — Other Ambulatory Visit: Payer: Self-pay

## 2023-06-27 NOTE — Progress Notes (Signed)
Specialty Pharmacy Refill Coordination Note  Bobby [name]. is a 72 y.o. male contacted today regarding refills of specialty medication(s) Abiraterone Acetate   Patient requested Delivery   Delivery date: 07/01/23   Verified address: [address] [place] [address]   Medication will be filled on 06/30/23.

## 2023-06-30 ENCOUNTER — Other Ambulatory Visit (HOSPITAL_COMMUNITY): Payer: Self-pay

## 2023-06-30 ENCOUNTER — Other Ambulatory Visit: Payer: Self-pay

## 2023-06-30 ENCOUNTER — Encounter: Payer: Self-pay | Admitting: Oncology

## 2023-06-30 ENCOUNTER — Telehealth: Payer: Self-pay

## 2023-06-30 DIAGNOSIS — E669 Obesity, unspecified: Secondary | ICD-10-CM | POA: Diagnosis not present

## 2023-06-30 DIAGNOSIS — M810 Age-related osteoporosis without current pathological fracture: Secondary | ICD-10-CM | POA: Diagnosis not present

## 2023-06-30 DIAGNOSIS — I5022 Chronic systolic (congestive) heart failure: Secondary | ICD-10-CM | POA: Diagnosis not present

## 2023-06-30 DIAGNOSIS — M4316 Spondylolisthesis, lumbar region: Secondary | ICD-10-CM | POA: Diagnosis not present

## 2023-06-30 DIAGNOSIS — I110 Hypertensive heart disease with heart failure: Secondary | ICD-10-CM | POA: Diagnosis not present

## 2023-06-30 DIAGNOSIS — T84216A Breakdown (mechanical) of internal fixation device of vertebrae, initial encounter: Secondary | ICD-10-CM | POA: Diagnosis not present

## 2023-06-30 DIAGNOSIS — Z981 Arthrodesis status: Secondary | ICD-10-CM | POA: Diagnosis not present

## 2023-06-30 DIAGNOSIS — M960 Pseudarthrosis after fusion or arthrodesis: Secondary | ICD-10-CM | POA: Diagnosis not present

## 2023-06-30 DIAGNOSIS — M48062 Spinal stenosis, lumbar region with neurogenic claudication: Secondary | ICD-10-CM | POA: Diagnosis not present

## 2023-06-30 DIAGNOSIS — M5416 Radiculopathy, lumbar region: Secondary | ICD-10-CM | POA: Diagnosis not present

## 2023-06-30 DIAGNOSIS — T84226A Displacement of internal fixation device of vertebrae, initial encounter: Secondary | ICD-10-CM | POA: Diagnosis not present

## 2023-06-30 DIAGNOSIS — T84038A Mechanical loosening of other internal prosthetic joint, initial encounter: Secondary | ICD-10-CM | POA: Diagnosis not present

## 2023-06-30 DIAGNOSIS — M4726 Other spondylosis with radiculopathy, lumbar region: Secondary | ICD-10-CM | POA: Diagnosis not present

## 2023-06-30 DIAGNOSIS — M4317 Spondylolisthesis, lumbosacral region: Secondary | ICD-10-CM | POA: Diagnosis not present

## 2023-06-30 NOTE — Telephone Encounter (Signed)
Oral Oncology Patient Advocate Encounter  Was successful in securing patient a $8,000.00 grant from [facility] to provide copayment coverage for Abiraterone.  This will keep the out of pocket expense at $0.     [employer] ID: [id number]   The billing information is as follows and has been shared with [facility].    RxBin: [id number] PCN: [id number] Member ID: [id number] Group ID: [id number] Dates of Eligibility: 06/12/23 through 06/10/24  Fund:  Prostate Cancer - Medicare Access   Ardeen Fillers, CPhT Oncology Pharmacy Patient Advocate  [facility]  [phone] (phone) [phone] (fax) 06/30/2023 11:26 AM

## 2023-06-30 NOTE — Progress Notes (Signed)
Spoke with Wife about copay $100.00 check with [employer]. grant is expired and  has him waitlisted for when Prostate funding opens up again. Informed all this to the wife & wants to hold off on this refill she believe patient has a whole bottle and if that's not correct she will call back to fill.  I have let Romeo Apple know the outcome of the call as well.

## 2023-07-01 DIAGNOSIS — I130 Hypertensive heart and chronic kidney disease with heart failure and stage 1 through stage 4 chronic kidney disease, or unspecified chronic kidney disease: Secondary | ICD-10-CM | POA: Insufficient documentation

## 2023-07-01 DIAGNOSIS — D500 Iron deficiency anemia secondary to blood loss (chronic): Secondary | ICD-10-CM | POA: Insufficient documentation

## 2023-07-01 DIAGNOSIS — N1831 Chronic kidney disease, stage 3a: Secondary | ICD-10-CM | POA: Insufficient documentation

## 2023-07-01 NOTE — Telephone Encounter (Signed)
Called and spoke to patient's wife, Bobby [name], regarding renewal of [employer]. They are aware grant has been renewed through 06/10/2024 and that Abiraterone co-pay will remain at $0.00. Patient's wife indicated that patient was currently in hospital and that they had a month's worth of medication on hand. Patient also knows to call me at [phone] with any questions or concerns regarding receiving medication or if there is any unexpected change in co-pay.    Ardeen Fillers, CPhT Oncology Pharmacy Patient Advocate  [facility]  [phone] (phone) [phone] (fax) 07/01/2023 8:40 AM

## 2023-07-24 ENCOUNTER — Ambulatory Visit: Payer: Medicare PPO | Admitting: Oncology

## 2023-07-24 ENCOUNTER — Other Ambulatory Visit: Payer: Self-pay

## 2023-07-24 ENCOUNTER — Other Ambulatory Visit: Payer: Medicare PPO

## 2023-07-25 ENCOUNTER — Other Ambulatory Visit: Payer: Self-pay | Admitting: Cardiology

## 2023-07-25 DIAGNOSIS — I5032 Chronic diastolic (congestive) heart failure: Secondary | ICD-10-CM

## 2023-07-25 DIAGNOSIS — R0609 Other forms of dyspnea: Secondary | ICD-10-CM

## 2023-07-28 ENCOUNTER — Other Ambulatory Visit: Payer: Self-pay

## 2023-07-29 DIAGNOSIS — M960 Pseudarthrosis after fusion or arthrodesis: Secondary | ICD-10-CM | POA: Diagnosis not present

## 2023-07-29 DIAGNOSIS — Z981 Arthrodesis status: Secondary | ICD-10-CM | POA: Diagnosis not present

## 2023-07-29 DIAGNOSIS — M4316 Spondylolisthesis, lumbar region: Secondary | ICD-10-CM | POA: Diagnosis not present

## 2023-07-30 ENCOUNTER — Other Ambulatory Visit: Payer: Self-pay

## 2023-07-30 NOTE — Progress Notes (Signed)
Specialty Pharmacy Refill Coordination Note  Bobby [name]. is a 72 y.o. male contacted today regarding refills of specialty medication(s) Abiraterone Acetate   Patient requested Delivery   Delivery date: 08/06/23   Verified address: [address] [place] [address]   Medication will be filled on 08/05/23.

## 2023-08-05 ENCOUNTER — Other Ambulatory Visit: Payer: Self-pay

## 2023-08-18 ENCOUNTER — Other Ambulatory Visit: Payer: Self-pay

## 2023-08-18 ENCOUNTER — Other Ambulatory Visit (HOSPITAL_COMMUNITY): Payer: Self-pay

## 2023-08-18 ENCOUNTER — Other Ambulatory Visit: Payer: Self-pay | Admitting: Oncology

## 2023-08-18 DIAGNOSIS — C61 Malignant neoplasm of prostate: Secondary | ICD-10-CM

## 2023-08-18 MED ORDER — ABIRATERONE ACETATE 250 MG PO TABS
1000.0000 mg | ORAL_TABLET | Freq: Every day | ORAL | 0 refills | Status: DC
  Filled 2023-08-19: qty 120, 30d supply, fill #0

## 2023-08-18 NOTE — Progress Notes (Signed)
     Specialty Pharmacy Refill Coordination Note  Bobby [name]. is a 72 y.o. male contacted today regarding refills of specialty medication(s) Abiraterone Acetate   Patient requested Delivery   Delivery date: 08/28/23   Verified address: [address], [place], [place]   Medication will be filled on 08/27/23.  Refill request pending

## 2023-08-19 ENCOUNTER — Other Ambulatory Visit (HOSPITAL_COMMUNITY): Payer: Self-pay

## 2023-08-22 DIAGNOSIS — I130 Hypertensive heart and chronic kidney disease with heart failure and stage 1 through stage 4 chronic kidney disease, or unspecified chronic kidney disease: Secondary | ICD-10-CM | POA: Diagnosis not present

## 2023-08-22 DIAGNOSIS — E785 Hyperlipidemia, unspecified: Secondary | ICD-10-CM | POA: Diagnosis not present

## 2023-08-22 DIAGNOSIS — C61 Malignant neoplasm of prostate: Secondary | ICD-10-CM | POA: Diagnosis not present

## 2023-08-22 DIAGNOSIS — I5033 Acute on chronic diastolic (congestive) heart failure: Secondary | ICD-10-CM | POA: Diagnosis not present

## 2023-08-22 DIAGNOSIS — D500 Iron deficiency anemia secondary to blood loss (chronic): Secondary | ICD-10-CM | POA: Diagnosis not present

## 2023-08-22 DIAGNOSIS — R7303 Prediabetes: Secondary | ICD-10-CM | POA: Diagnosis not present

## 2023-08-22 DIAGNOSIS — J449 Chronic obstructive pulmonary disease, unspecified: Secondary | ICD-10-CM | POA: Diagnosis not present

## 2023-08-22 DIAGNOSIS — N1831 Chronic kidney disease, stage 3a: Secondary | ICD-10-CM | POA: Diagnosis not present

## 2023-08-25 ENCOUNTER — Other Ambulatory Visit: Payer: Self-pay | Admitting: *Deleted

## 2023-08-25 ENCOUNTER — Other Ambulatory Visit: Payer: Medicare PPO

## 2023-08-25 ENCOUNTER — Inpatient Hospital Stay: Payer: Medicare PPO | Attending: Oncology | Admitting: Oncology

## 2023-08-25 ENCOUNTER — Inpatient Hospital Stay: Payer: Medicare PPO

## 2023-08-25 ENCOUNTER — Encounter: Payer: Self-pay | Admitting: *Deleted

## 2023-08-25 VITALS — BP 129/61 | HR 75 | Temp 98.1°F | Resp 18 | Ht 66.0 in | Wt 241.0 lb

## 2023-08-25 DIAGNOSIS — D509 Iron deficiency anemia, unspecified: Secondary | ICD-10-CM | POA: Diagnosis not present

## 2023-08-25 DIAGNOSIS — C61 Malignant neoplasm of prostate: Secondary | ICD-10-CM

## 2023-08-25 DIAGNOSIS — Z5111 Encounter for antineoplastic chemotherapy: Secondary | ICD-10-CM | POA: Diagnosis not present

## 2023-08-25 LAB — CBC WITH DIFFERENTIAL (CANCER CENTER ONLY)
Abs Immature Granulocytes: 0.02 10*3/uL (ref 0.00–0.07)
Basophils Absolute: 0 10*3/uL (ref 0.0–0.1)
Basophils Relative: 1 %
Eosinophils Absolute: 0.2 10*3/uL (ref 0.0–0.5)
Eosinophils Relative: 3 %
HCT: 41.5 % (ref 39.0–52.0)
Hemoglobin: 12.8 g/dL — ABNORMAL LOW (ref 13.0–17.0)
Immature Granulocytes: 0 %
Lymphocytes Relative: 18 %
Lymphs Abs: 1.2 10*3/uL (ref 0.7–4.0)
MCH: 24.3 pg — ABNORMAL LOW (ref 26.0–34.0)
MCHC: 30.8 g/dL (ref 30.0–36.0)
MCV: 78.9 fL — ABNORMAL LOW (ref 80.0–100.0)
Monocytes Absolute: 0.6 10*3/uL (ref 0.1–1.0)
Monocytes Relative: 9 %
Neutro Abs: 4.7 10*3/uL (ref 1.7–7.7)
Neutrophils Relative %: 69 %
Platelet Count: 249 10*3/uL (ref 150–400)
RBC: 5.26 MIL/uL (ref 4.22–5.81)
RDW: 16.4 % — ABNORMAL HIGH (ref 11.5–15.5)
WBC Count: 6.8 10*3/uL (ref 4.0–10.5)
nRBC: 0 % (ref 0.0–0.2)

## 2023-08-25 LAB — URINALYSIS, COMPLETE (UACMP) WITH MICROSCOPIC
Bacteria, UA: NONE SEEN
Bilirubin Urine: NEGATIVE
Glucose, UA: >1000 mg/dL — AB
Ketones, ur: NEGATIVE mg/dL
Leukocytes,Ua: NEGATIVE
Nitrite: NEGATIVE
Specific Gravity, Urine: 1.04 — ABNORMAL HIGH (ref 1.005–1.030)
pH: 5.5 (ref 5.0–8.0)

## 2023-08-25 LAB — FERRITIN: Ferritin: 9 ng/mL — ABNORMAL LOW (ref 24–336)

## 2023-08-25 MED ORDER — FERROUS SULFATE 325 (65 FE) MG PO TBEC: 325.0000 mg | DELAYED_RELEASE_TABLET | Freq: Every day | ORAL | Status: DC

## 2023-08-25 MED ORDER — LEUPROLIDE ACETATE (4 MONTH) 30 MG ~~LOC~~ KIT
30.0000 mg | PACK | Freq: Once | SUBCUTANEOUS | Status: AC
Start: 2023-08-25 — End: 2023-08-25
  Administered 2023-08-25: 30 mg via SUBCUTANEOUS
  Filled 2023-08-25: qty 30

## 2023-08-25 NOTE — Progress Notes (Signed)
Faxed referral order, demographics and medical records to [facility] for referral to Dr. Annabell Howells. Fax #[phone]

## 2023-08-25 NOTE — Progress Notes (Signed)
  [facility] OFFICE PROGRESS NOTE   Diagnosis: Cancer, anemia  INTERVAL HISTORY:   Bobby [name] returns as scheduled.  He reports improvement in back pain since starting a spine stimulator.  He uses the stimulator 2 hours/day. He continues abiraterone/prednisone.  He is due for a Lupron injection today.  The pain is at the low back.  No other pain. No bleeding from the urine or bowels.  He is not taking iron.  He has not seen gastroenterology.  Objective:  Vital signs in last 24 hours:  Blood pressure 129/61, pulse 75, temperature 98.1 F (36.7 C), temperature source Temporal, resp. rate 18, height 5\' 6"  (1.676 m), weight 241 lb (109.3 kg), SpO2 99%.    Lymphatics: No cervical, supraclavicular, axillary, or inguinal nodes Resp: Lungs clear bilaterally Cardio: Regular rate and rhythm GI: No hepatosplenomegaly, nontender Vascular: Trace edema to left greater than right lower leg  Lab Results:  Lab Results  Component Value Date   WBC 6.8 08/25/2023   HGB 12.8 (L) 08/25/2023   HCT 41.5 08/25/2023   MCV 78.9 (L) 08/25/2023   PLT 249 08/25/2023   NEUTROABS 4.7 08/25/2023    CMP  Lab Results  Component Value Date   NA 140 04/23/2023   K 3.7 04/23/2023   CL 106 04/23/2023   CO2 25 04/23/2023   GLUCOSE 103 (H) 04/23/2023   BUN 19 04/23/2023   CREATININE 0.99 04/23/2023   CALCIUM 9.2 04/23/2023   PROT 6.6 04/23/2023   ALBUMIN 4.0 04/23/2023   AST 9 (L) 04/23/2023   ALT 6 04/23/2023   ALKPHOS 108 04/23/2023   BILITOT 0.4 04/23/2023   GFRNONAA >60 04/23/2023   GFRAA 81 09/29/2020    No results found for: "CEA1", "CEA", "JYN829", "CA125"  Lab Results  Component Value Date   INR 1.0 05/06/2020   LABPROT 13.1 05/06/2020    Imaging:  No results found.  Medications: I have reviewed the patient's current medications.   Assessment/Plan: Prostate cancer-Gleason 4+5 = 9, PSA 14.15 April 2017 Negative bone scan September 2018 CT September  2018-diverticulitis in the sigmoid colon, enlarged retroperitoneal and pelvic lymph nodes Evaluated in the multidisciplinary prostate cancer clinic and considered to have metastatic disease, radiation not recommended Started treatment with leuprolide and abiraterone/prednisone (October 2018) CT abdomen/pelvis 12/20/2021- acute sigmoid diverticulitis, no adenopathy  2.  CHF 3.  Hyperlipidemia 4.  Lumbar disc disease 5.  Iron deficiency anemia August 2023 Low ferritin 12/20/2022, 02/05/2023 01/20/2023-stool Hemoccult cards negative Colonoscopy 07/09/2018-polyps removed from the ascending and transverse colon-tubular adenomas 6.  History of Kidney stones 7.  Hypertension 8.  L4-S1 laminectomy and fusion 03/03/2023     Disposition: Bobby [name] has metastatic hormone sensitive prostate cancer.  He remains in clinical remission while on abiraterone/prednisone and leuprolide.  He will receive a leuprolide injection today.  We will follow-up on the PSA from today.  Bobby [name] has iron deficiency anemia.  No clear source for blood loss has been identified.  I recommended he follow-up with gastroenterology.  He has microscopic hematuria.  This may be responsible for the iron deficiency.  I recommend he schedule an appointment with Dr. Annabell [name] for further evaluation.  Bobby [name] will return for an office and lab visit in 4 months.  Bobby Papas, MD  08/25/2023  10:21 AM

## 2023-08-25 NOTE — Patient Instructions (Signed)

## 2023-08-25 NOTE — Patient Instructions (Signed)
Start ferrous sulfate 325 mg daily-take with breakfast To avoid constipation start Colace (docusate sodium) 100 mg 1-2 daily   If not effective start MiraLax 17 grams daily

## 2023-08-26 ENCOUNTER — Telehealth: Payer: Self-pay | Admitting: *Deleted

## 2023-08-26 LAB — PROSTATE-SPECIFIC AG, SERUM (LABCORP): Prostate Specific Ag, Serum: <0.1 ng/mL (ref 0.0–4.0)

## 2023-08-26 NOTE — Telephone Encounter (Signed)
Notified that PSA is in undetectable range. F/U as scheduled.

## 2023-08-26 NOTE — Telephone Encounter (Signed)
-----   Message from Thornton Papas sent at 08/26/2023  1:54 PM EST ----- Please call patient, PSA remains undetectable, follow-up as scheduled

## 2023-08-27 ENCOUNTER — Other Ambulatory Visit (HOSPITAL_COMMUNITY): Payer: Self-pay

## 2023-08-29 ENCOUNTER — Other Ambulatory Visit: Payer: Self-pay | Admitting: Cardiology

## 2023-09-17 ENCOUNTER — Other Ambulatory Visit: Payer: Self-pay

## 2023-09-17 ENCOUNTER — Other Ambulatory Visit: Payer: Self-pay | Admitting: Oncology

## 2023-09-17 DIAGNOSIS — C61 Malignant neoplasm of prostate: Secondary | ICD-10-CM

## 2023-09-17 MED ORDER — ABIRATERONE ACETATE 250 MG PO TABS
1000.0000 mg | ORAL_TABLET | Freq: Every day | ORAL | 2 refills | Status: DC
  Filled 2023-09-17: qty 120, 30d supply, fill #0
  Filled 2023-10-22: qty 120, 30d supply, fill #1
  Filled 2023-11-14: qty 120, 30d supply, fill #2

## 2023-09-17 NOTE — Progress Notes (Signed)
 Specialty Pharmacy Ongoing Clinical Assessment Note  Bobby Vaun Hyndman. is a 74 y.o. male who is being followed by the specialty pharmacy service for RxSp Oncology   Patient's specialty medication(s) reviewed today: Abiraterone  Acetate (ZYTIGA )   Missed doses in the last 4 weeks: 0   Patient/Caregiver did not have any additional questions or concerns.   Therapeutic benefit summary: Patient is achieving benefit   Adverse events/side effects summary: No adverse events/side effects   Patient's therapy is appropriate to: Continue    Goals Addressed             This Visit's Progress    Slow Disease Progression       Patient is on track. Patient will maintain adherence. PSA remains undetectable.          Follow up:  6 months  Bobby [name] M Bricia Taher Specialty Pharmacist

## 2023-09-17 NOTE — Progress Notes (Signed)
 Specialty Pharmacy Refill Coordination Note  Bobby [name]. is a 73 y.o. male contacted today regarding refills of specialty medication(s) Abiraterone  Acetate (ZYTIGA )   Patient requested Delivery   Delivery date: 09/26/23   Verified address: [address]   [place] [address]   Medication will be filled on 09/25/23.   Pending refill request

## 2023-09-17 NOTE — Progress Notes (Signed)
 Pending Refill Request Completed.

## 2023-09-23 ENCOUNTER — Other Ambulatory Visit (HOSPITAL_COMMUNITY): Payer: Self-pay

## 2023-09-25 ENCOUNTER — Encounter: Payer: Self-pay | Admitting: Oncology

## 2023-09-25 ENCOUNTER — Other Ambulatory Visit (HOSPITAL_COMMUNITY): Payer: Self-pay

## 2023-10-01 DIAGNOSIS — Z6836 Body mass index (BMI) 36.0-36.9, adult: Secondary | ICD-10-CM | POA: Diagnosis not present

## 2023-10-01 DIAGNOSIS — I10 Essential (primary) hypertension: Secondary | ICD-10-CM | POA: Diagnosis not present

## 2023-10-01 DIAGNOSIS — M4326 Fusion of spine, lumbar region: Secondary | ICD-10-CM | POA: Diagnosis not present

## 2023-10-01 DIAGNOSIS — M4316 Spondylolisthesis, lumbar region: Secondary | ICD-10-CM | POA: Diagnosis not present

## 2023-10-03 DIAGNOSIS — Z860100 Personal history of colon polyps, unspecified: Secondary | ICD-10-CM | POA: Diagnosis not present

## 2023-10-03 DIAGNOSIS — D509 Iron deficiency anemia, unspecified: Secondary | ICD-10-CM | POA: Diagnosis not present

## 2023-10-08 DIAGNOSIS — R8271 Bacteriuria: Secondary | ICD-10-CM | POA: Diagnosis not present

## 2023-10-08 DIAGNOSIS — R3121 Asymptomatic microscopic hematuria: Secondary | ICD-10-CM | POA: Diagnosis not present

## 2023-10-08 DIAGNOSIS — C61 Malignant neoplasm of prostate: Secondary | ICD-10-CM | POA: Diagnosis not present

## 2023-10-14 ENCOUNTER — Other Ambulatory Visit: Payer: Self-pay

## 2023-10-20 ENCOUNTER — Other Ambulatory Visit: Payer: Self-pay | Admitting: Cardiology

## 2023-10-22 ENCOUNTER — Other Ambulatory Visit: Payer: Self-pay

## 2023-10-22 NOTE — Progress Notes (Signed)
Specialty Pharmacy Refill Coordination Note  Bobby [name]. is a 73 y.o. male contacted today regarding refills of specialty medication(s) Abiraterone Acetate [name])   Patient requested Delivery   Delivery date: 10/30/23   Verified address: [address] [place], [place] [address]   Medication will be filled on 10/29/2023.

## 2023-10-27 ENCOUNTER — Other Ambulatory Visit: Payer: Self-pay

## 2023-10-27 NOTE — Progress Notes (Signed)
 Patient was contacted via mychart that due to possible impending winter storm, medication will arrive on Tuesday 10/28/23.

## 2023-10-30 DIAGNOSIS — R3121 Asymptomatic microscopic hematuria: Secondary | ICD-10-CM | POA: Diagnosis not present

## 2023-11-06 ENCOUNTER — Telehealth: Payer: Self-pay | Admitting: Cardiology

## 2023-11-06 MED ORDER — SACUBITRIL-VALSARTAN 49-51 MG PO TABS: 1.0000 | ORAL_TABLET | Freq: Two times a day (BID) | ORAL | 2 refills | Status: DC

## 2023-11-06 NOTE — Telephone Encounter (Signed)
*  STAT* If patient is at the pharmacy, call can be transferred to refill team.   1. Which medications need to be refilled? (please list name of each medication and dose if known)   sacubitril-valsartan (ENTRESTO) 49-51 MG   2. Would you like to learn more about the convenience, safety, & potential cost savings by using the [facility]?   3. Are you open to using the [facility] (Type [facility]. ).  4. Which pharmacy/location (including street and city if local pharmacy) is medication to be sent to?  [facility] [id number] - [place], [place] - [address]   5. Do they need a 30 day or 90 day supply?   90 day  Caller ([place]) stated patient is completely out of this medication.  Caller noted they do not carry this medication and patient requested refill be sent to [facility].

## 2023-11-06 NOTE — Telephone Encounter (Signed)
 Pt's medication was sent to pt's pharmacy as requested. Confirmation received.

## 2023-11-11 ENCOUNTER — Other Ambulatory Visit: Payer: Self-pay

## 2023-11-13 ENCOUNTER — Other Ambulatory Visit: Payer: Self-pay

## 2023-11-14 ENCOUNTER — Other Ambulatory Visit: Payer: Self-pay

## 2023-11-14 NOTE — Progress Notes (Signed)
 Specialty Pharmacy Refill Coordination Note  Bobby [name]. is a 73 y.o. male contacted today regarding refills of specialty medication(s) Abiraterone Acetate (ZYTIGA)   Patient requested (Patient-Rptd) Delivery   Delivery date: (Patient-Rptd) 11/19/23   Verified address: (Patient-Rptd) [address], [place], [place]   Medication will be filled on 03.11.25.

## 2023-11-26 ENCOUNTER — Encounter: Payer: Self-pay | Admitting: Oncology

## 2023-12-03 DIAGNOSIS — R3121 Asymptomatic microscopic hematuria: Secondary | ICD-10-CM | POA: Diagnosis not present

## 2023-12-16 ENCOUNTER — Other Ambulatory Visit: Payer: Self-pay

## 2023-12-17 ENCOUNTER — Other Ambulatory Visit: Payer: Self-pay | Admitting: Oncology

## 2023-12-17 ENCOUNTER — Other Ambulatory Visit: Payer: Self-pay

## 2023-12-17 DIAGNOSIS — C61 Malignant neoplasm of prostate: Secondary | ICD-10-CM

## 2023-12-17 MED ORDER — ABIRATERONE ACETATE 250 MG PO TABS
1000.0000 mg | ORAL_TABLET | Freq: Every day | ORAL | 2 refills | Status: DC
  Filled 2023-12-17: qty 120, 30d supply, fill #0
  Filled 2024-01-14: qty 120, 30d supply, fill #1
  Filled 2024-02-13: qty 120, 30d supply, fill #2

## 2023-12-17 NOTE — Progress Notes (Signed)
 Specialty Pharmacy Refill Coordination Note  Bobby [name]. is a 73 y.o. male contacted today regarding refills of specialty medication(s) Abiraterone Acetate (ZYTIGA)   Patient requested (Patient-Rptd) Delivery   Delivery date: (Patient-Rptd) 12/24/23   Verified address: (Patient-Rptd) [address], [place],    Medication will be filled on 04.15.25.   This fill date is pending response to refill request from provider. Patient is aware and if they have not received fill by intended date they must follow up with pharmacy.

## 2023-12-18 ENCOUNTER — Encounter: Payer: Self-pay | Admitting: Oncology

## 2023-12-22 ENCOUNTER — Other Ambulatory Visit: Payer: Self-pay

## 2023-12-24 DIAGNOSIS — K080 Exfoliation of teeth due to systemic causes: Secondary | ICD-10-CM | POA: Diagnosis not present

## 2023-12-25 ENCOUNTER — Inpatient Hospital Stay: Payer: Medicare PPO | Attending: Oncology

## 2023-12-25 ENCOUNTER — Inpatient Hospital Stay: Payer: Medicare PPO | Admitting: Oncology

## 2023-12-25 ENCOUNTER — Inpatient Hospital Stay: Payer: Medicare PPO

## 2023-12-25 VITALS — BP 130/61 | HR 71 | Temp 98.1°F | Resp 18 | Ht 66.0 in | Wt 225.5 lb

## 2023-12-25 DIAGNOSIS — C61 Malignant neoplasm of prostate: Secondary | ICD-10-CM

## 2023-12-25 DIAGNOSIS — Z5111 Encounter for antineoplastic chemotherapy: Secondary | ICD-10-CM | POA: Diagnosis not present

## 2023-12-25 DIAGNOSIS — Z79899 Other long term (current) drug therapy: Secondary | ICD-10-CM | POA: Diagnosis not present

## 2023-12-25 DIAGNOSIS — D509 Iron deficiency anemia, unspecified: Secondary | ICD-10-CM | POA: Diagnosis not present

## 2023-12-25 LAB — CBC WITH DIFFERENTIAL (CANCER CENTER ONLY)
Abs Immature Granulocytes: 0.02 10*3/uL (ref 0.00–0.07)
Basophils Absolute: 0.1 10*3/uL (ref 0.0–0.1)
Basophils Relative: 1 %
Eosinophils Absolute: 0.2 10*3/uL (ref 0.0–0.5)
Eosinophils Relative: 2 %
HCT: 42.1 % (ref 39.0–52.0)
Hemoglobin: 13.7 g/dL (ref 13.0–17.0)
Immature Granulocytes: 0 %
Lymphocytes Relative: 12 %
Lymphs Abs: 1 10*3/uL (ref 0.7–4.0)
MCH: 25.8 pg — ABNORMAL LOW (ref 26.0–34.0)
MCHC: 32.5 g/dL (ref 30.0–36.0)
MCV: 79.4 fL — ABNORMAL LOW (ref 80.0–100.0)
Monocytes Absolute: 0.7 10*3/uL (ref 0.1–1.0)
Monocytes Relative: 7 %
Neutro Abs: 6.9 10*3/uL (ref 1.7–7.7)
Neutrophils Relative %: 78 %
Platelet Count: 250 10*3/uL (ref 150–400)
RBC: 5.3 MIL/uL (ref 4.22–5.81)
RDW: 16.8 % — ABNORMAL HIGH (ref 11.5–15.5)
WBC Count: 8.9 10*3/uL (ref 4.0–10.5)
nRBC: 0 % (ref 0.0–0.2)

## 2023-12-25 LAB — FERRITIN: Ferritin: 20 ng/mL — ABNORMAL LOW (ref 24–336)

## 2023-12-25 MED ORDER — LEUPROLIDE ACETATE (4 MONTH) 30 MG ~~LOC~~ KIT
30.0000 mg | PACK | Freq: Once | SUBCUTANEOUS | Status: AC
Start: 2023-12-25 — End: 2023-12-25
  Administered 2023-12-25: 30 mg via SUBCUTANEOUS
  Filled 2023-12-25: qty 30

## 2023-12-25 NOTE — Patient Instructions (Signed)
 Leuprolide Emulsion for Injection What is this medication? LEUPROLIDE (loo PROE lide) reduces the symptoms of prostate cancer. It works by decreasing levels of the hormone testosterone in the body. This prevents prostate cancer cells from spreading or growing. This medicine may be used for other purposes; ask your health care provider or pharmacist if you have questions. COMMON BRAND NAME(S): CAMCEVI What should I tell my care team before I take this medication? They need to know if you have any of these conditions: Diabetes Heart disease Heart failure High or low levels of electrolytes, such as magnesium, potassium, or sodium in your blood Irregular heartbeat or rhythm Seizures An unusual or allergic reaction to leuprolide, other medications, foods, dyes, or preservatives Pregnant or trying to get pregnant Breastfeeding How should I use this medication? This medication is injected under the skin. It is given by your care team in a hospital or clinic setting. Talk to your care team about the use of this medication in children. Special care may be needed. Overdosage: If you think you have taken too much of this medicine contact a poison control center or emergency room at once. NOTE: This medicine is only for you. Do not share this medicine with others. What if I miss a dose? Keep appointments for follow-up doses. It is important not to miss your dose. Call your care team if you are unable to keep an appointment. What may interact with this medication? Do not take this medication with any of the following: Cisapride Dronedarone Ketoconazole Levoketoconazole Pimozide Thioridazine This medication may also interact with the following: Other medications that cause heart rhythm changes This list may not describe all possible interactions. Give your health care provider a list of all the medicines, herbs, non-prescription drugs, or dietary supplements you use. Also tell them if you smoke,  drink alcohol, or use illegal drugs. Some items may interact with your medicine. What should I watch for while using this medication? Visit your care team for regular checks on your progress. Tell your care team if your symptoms do not start to get better or if they get worse. This medication may increase blood sugar. The risk may be higher in patients who already have diabetes. Ask your care team what you can do to lower the risk of diabetes while taking this medication. This medication may cause infertility. Talk to your care team if you are concerned about your fertility. Heart attacks and strokes have been reported with the use of this medication. Get emergency help if you develop signs or symptoms of a heart attack or stroke. Talk to your care team about the risks and benefits of this medication. What side effects may I notice from receiving this medication? Side effects that you should report to your care team as soon as possible: Allergic reactions--skin rash, itching, hives, swelling of the face, lips, tongue, or throat Heart attack--pain or tightness in the chest, shoulders, arms, or jaw, nausea, shortness of breath, cold or clammy skin, feeling faint or lightheaded Heart rhythm changes--fast or irregular heartbeat, dizziness, feeling faint or lightheaded, chest pain, trouble breathing High blood sugar (hyperglycemia)--increased thirst or amount of urine, unusual weakness or fatigue, blurry vision New or worsening seizures Redness, blistering, peeling, or loosening of the skin, including inside the mouth Stroke--sudden numbness or weakness of the face, arm, or leg, trouble speaking, confusion, trouble walking, loss of balance or coordination, dizziness, severe headache, change in vision Swelling and pain of the tumor site or lymph nodes Side effects that  usually do not require medical attention (report these to your care team if they continue or are bothersome): Change in sex drive or  performance Hot flashes Joint pain Pain, redness, or irritation at injection site Swelling of the ankles, hands, or feet Unusual weakness or fatigue This list may not describe all possible side effects. Call your doctor for medical advice about side effects. You may report side effects to FDA at 1-800-FDA-1088. Where should I keep my medication? This medication is given in a hospital or clinic. It will not be stored at home. NOTE: This sheet is a summary. It may not cover all possible information. If you have questions about this medicine, talk to your doctor, pharmacist, or health care provider.  2024 Elsevier/Gold Standard (2023-08-08 00:00:00)

## 2023-12-25 NOTE — Progress Notes (Signed)
  [facility] OFF[facility] PROGRESS NOTE   Diagnosis: Prostate cancer, iron deficiency anemia  INTERVAL HISTORY:   Bobby [name] returns as scheduled.  He continues abiraterone and prednisone.  He is due for a Lupron injection today.  No hot flashes.  He has diminished sexual function since beginning hormonal therapy. Back pain has improved following the most recent back surgery several months ago.  No bleeding.  He reports the most recent urinalysis with Dr. Annabell Howells showed no blood.  He saw Dr. Dulce Sellar for evaluation of iron deficiency anemia, but did not undergo an endoscopic evaluation.  He is taking iron.  He reports intentional weight loss by dieting.  Objective:  Vital signs in last 24 hours:  Blood pressure 130/61, pulse 71, temperature 98.1 F (36.7 C), temperature source Temporal, resp. rate 18, height 5\' 6"  (1.676 m), weight 225 lb 8 oz (102.3 kg), SpO2 98%.    Lymphatics: No cervical, supraclavicular, axillary, or inguinal nodes Resp: Lungs clear bilaterally Cardio: Regular rate and rhythm GI: No hepatosplenomegaly Vascular: No leg edema   Lab Results:  Lab Results  Component Value Date   WBC 8.9 12/25/2023   HGB 13.7 12/25/2023   HCT 42.1 12/25/2023   MCV 79.4 (L) 12/25/2023   PLT 250 12/25/2023   NEUTROABS 6.9 12/25/2023    CMP  Lab Results  Component Value Date   NA 140 04/23/2023   K 3.7 04/23/2023   CL 106 04/23/2023   CO2 25 04/23/2023   GLUCOSE 103 (H) 04/23/2023   BUN 19 04/23/2023   CREATININE 0.99 04/23/2023   CALCIUM 9.2 04/23/2023   PROT 6.6 04/23/2023   ALBUMIN 4.0 04/23/2023   AST 9 (L) 04/23/2023   ALT 6 04/23/2023   ALKPHOS 108 04/23/2023   BILITOT 0.4 04/23/2023   GFRNONAA >60 04/23/2023   GFRAA 81 09/29/2020     Medications: I have reviewed the patient's current medications.   Assessment/Plan: Prostate cancer-Gleason 4+5 = 9, PSA 14.15 April 2017 Negative bone scan September 2018 CT September 2018-diverticulitis in the  sigmoid colon, enlarged retroperitoneal and pelvic lymph nodes Evaluated in the multidisciplinary prostate cancer clinic and considered to have metastatic disease, radiation not recommended Started treatment with leuprolide and abiraterone/prednisone (October 2018) CT abdomen/pelvis 12/20/2021- acute sigmoid diverticulitis, no adenopathy  2.  CHF 3.  Hyperlipidemia 4.  Lumbar disc disease 5.  Iron deficiency anemia August 2023 Low ferritin 12/20/2022, 02/05/2023 01/20/2023-stool Hemoccult cards negative Colonoscopy 07/09/2018-polyps removed from the ascending and transverse colon-tubular adenomas 6.  History of Kidney stones 7.  Hypertension 8.  L4-S1 laminectomy and fusion 03/03/2023      Disposition: Bobby [name] is in clinical remission from prostate cancer.  He is maintained on androgen deprivation therapy since October 2018.  The PSA was undetectable in December 2024.  Will follow-up on the PSA from today.  He will continue abiraterone/prednisone.  He will receive a leuprolide injection today.  Bobby [name] reports sexual dysfunction since beginning androgen deprivation therapy.  I discussed the possibility of holding hormonal therapy and considering local therapy if a restaging evaluation shows no measurable disease.  He was noted to have enlarged pelvic/retroperitoneal lymph nodes at presentation that were felt to most likely be related to prostate cancer versus diverticulitis.  I will contact Dr. Annabell Howells to get his opinion.  We would also consider intermittent androgen deprivation therapy.  He will be scheduled for an office visit in 4 months.  I will see him sooner depending on communication from Dr. Annabell Howells.  He continues iron therapy.  We will follow-up on the ferritin level from today.  I recommend he continue follow-up with gastroenterology.  Coni Deep, MD  12/25/2023  11:54 AM

## 2023-12-26 ENCOUNTER — Telehealth: Payer: Self-pay | Admitting: *Deleted

## 2023-12-26 LAB — PROSTATE-SPECIFIC AG, SERUM (LABCORP): Prostate Specific Ag, Serum: <0.1 ng/mL (ref 0.0–4.0)

## 2023-12-26 NOTE — Telephone Encounter (Signed)
 LVM to check MyChart for MD assessment of his lab results.

## 2023-12-26 NOTE — Telephone Encounter (Signed)
-----   Message from Coni Deep sent at 12/25/2023  4:20 PM EDT ----- Please call patient, the hemoglobin remains normal, the iron level is still low, continue daily iron, follow-up with Dr. Kimble Pennant to consider an endoscopic evaluation

## 2023-12-31 DIAGNOSIS — M4316 Spondylolisthesis, lumbar region: Secondary | ICD-10-CM | POA: Diagnosis not present

## 2023-12-31 DIAGNOSIS — M4326 Fusion of spine, lumbar region: Secondary | ICD-10-CM | POA: Diagnosis not present

## 2023-12-31 DIAGNOSIS — I10 Essential (primary) hypertension: Secondary | ICD-10-CM | POA: Diagnosis not present

## 2024-01-06 ENCOUNTER — Telehealth: Payer: Self-pay | Admitting: *Deleted

## 2024-01-06 ENCOUNTER — Other Ambulatory Visit: Payer: Self-pay | Admitting: *Deleted

## 2024-01-06 DIAGNOSIS — C61 Malignant neoplasm of prostate: Secondary | ICD-10-CM

## 2024-01-06 NOTE — Telephone Encounter (Signed)
 Called Bobby [name] with appointment for PSMA PET at [facility] on 5/8 at 1:45/2:00. No prep except to be well hydrated. Awaiting MD to provide appointment for scan review. Patient not able to come on Monday or Wednesday.

## 2024-01-06 NOTE — Progress Notes (Signed)
 Per Dr. Scherrie Curt: Needs PSMA PET scan with OV 1 week afterwards. Order placed and notified managed care to start on PA process.

## 2024-01-07 ENCOUNTER — Encounter: Payer: Self-pay | Admitting: *Deleted

## 2024-01-07 ENCOUNTER — Telehealth: Payer: Self-pay | Admitting: Oncology

## 2024-01-07 NOTE — Progress Notes (Signed)
 Per Dr. Scherrie Curt: Needs 30 minute f/u with him on 01/16/24 at 11:20 to review PSMA PET scan. High priority scheduling message sent.

## 2024-01-07 NOTE — Telephone Encounter (Signed)
 Called to schedule f/u appt per inbasket. LVM to return call for scheduling.

## 2024-01-12 ENCOUNTER — Other Ambulatory Visit: Payer: Self-pay

## 2024-01-13 DIAGNOSIS — M5459 Other low back pain: Secondary | ICD-10-CM | POA: Diagnosis not present

## 2024-01-14 ENCOUNTER — Other Ambulatory Visit: Payer: Self-pay

## 2024-01-14 NOTE — Progress Notes (Signed)
 Specialty Pharmacy Refill Coordination Note  Bobby Rim. is a 73 y.o. male contacted today regarding refills of specialty medication(s) Abiraterone  Acetate (ZYTIGA )   Patient requested (Patient-Rptd) Delivery   Delivery date: (Patient-Rptd) 01/21/24   Verified address: (Patient-Rptd) [address], [place], [place]   Medication will be filled on 05.13.25.

## 2024-01-15 ENCOUNTER — Encounter (HOSPITAL_COMMUNITY)
Admission: RE | Admit: 2024-01-15 | Discharge: 2024-01-15 | Disposition: A | Discharge status: Home / Self Care | Source: Ambulatory Visit | Attending: Oncology | Admitting: Oncology

## 2024-01-15 DIAGNOSIS — C61 Malignant neoplasm of prostate: Secondary | ICD-10-CM | POA: Diagnosis not present

## 2024-01-15 MED ORDER — FLOTUFOLASTAT F 18 GALLIUM 296-5846 MBQ/ML IV SOLN
8.0000 | Freq: Once | INTRAVENOUS | Status: AC
Start: 2024-01-15 — End: 2024-01-15
  Administered 2024-01-15: 7.5 via INTRAVENOUS
  Filled 2024-01-15: qty 8

## 2024-01-16 ENCOUNTER — Inpatient Hospital Stay: Attending: Oncology | Admitting: Oncology

## 2024-01-16 VITALS — BP 130/68 | HR 68 | Temp 97.9°F | Resp 18 | Ht 66.0 in | Wt 223.0 lb

## 2024-01-16 DIAGNOSIS — R5381 Other malaise: Secondary | ICD-10-CM | POA: Insufficient documentation

## 2024-01-16 DIAGNOSIS — C61 Malignant neoplasm of prostate: Secondary | ICD-10-CM | POA: Insufficient documentation

## 2024-01-16 DIAGNOSIS — M5459 Other low back pain: Secondary | ICD-10-CM | POA: Diagnosis not present

## 2024-01-16 DIAGNOSIS — C778 Secondary and unspecified malignant neoplasm of lymph nodes of multiple regions: Secondary | ICD-10-CM | POA: Diagnosis not present

## 2024-01-16 NOTE — Progress Notes (Signed)
 [facility] OFFICE PROGRESS NOTE   Diagnosis: Prostate cancer  INTERVAL HISTORY:   Mr. Cabreros returns as scheduled.  He continues abiraterone .  He complains of malaise.  He relates this to back pain and lack of testosterone .  The back pain is improved since he started physical therapy.  He does not have significant hot flashes.  He is here today with his wife. He relates weight loss to changing his diet. Objective:  Vital signs in last 24 hours:  Blood pressure 130/68, pulse 68, temperature 97.9 F (36.6 C), temperature source Temporal, resp. rate 18, height 5\' 6"  (1.676 m), weight 223 lb (101.2 kg), SpO2 98%.     Lab Results:  Lab Results  Component Value Date   WBC 8.9 12/25/2023   HGB 13.7 12/25/2023   HCT 42.1 12/25/2023   MCV 79.4 (L) 12/25/2023   PLT 250 12/25/2023   NEUTROABS 6.9 12/25/2023    CMP  Lab Results  Component Value Date   NA 140 04/23/2023   K 3.7 04/23/2023   CL 106 04/23/2023   CO2 25 04/23/2023   GLUCOSE 103 (H) 04/23/2023   BUN 19 04/23/2023   CREATININE 0.99 04/23/2023   CALCIUM  9.2 04/23/2023   PROT 6.6 04/23/2023   ALBUMIN 4.0 04/23/2023   AST 9 (L) 04/23/2023   ALT 6 04/23/2023   ALKPHOS 108 04/23/2023   BILITOT 0.4 04/23/2023   GFRNONAA >60 04/23/2023   GFRAA 81 09/29/2020    No results found for: "CEA1", "CEA", "CAN199", "CA125"  Lab Results  Component Value Date   INR 1.0 05/06/2020   LABPROT 13.1 05/06/2020    Imaging:  NM PET (PSMA) SKULL TO MID THIGH Result Date: 01/15/2024 CLINICAL DATA:  Initial treatment strategy for metastatic prostate cancer. Gleason 7. PSA less than [id number]. EXAM: NUCLEAR MEDICINE PET SKULL BASE TO THIGH TECHNIQUE: 7.5 mCi F18 Posluma ) was injected intravenously. Full-ring PET imaging was performed from the skull base to thigh after the radiotracer. CT data was obtained and used for attenuation correction and anatomic localization. COMPARISON:  None Available. FINDINGS: NECK There is  physiologic distribution of tracer along salivary glands and lacrimal gland. No specific abnormal uptake seen in the neck including along lymph node change of the submandibular, posterior triangle or internal jugular regions. Incidental CT finding: There is some streak artifact related to the patient's dental hardware. Paranasal sinuses and mastoid air cells are clear. Scattered vascular calcifications. Slightly small thyroid  gland. The submandibular and parotid glands are preserved. Note made of a lipoma along the musculature of the right shoulder. CHEST No specific abnormal radiotracer uptake identified above blood pool in the axillary regions, hilum or mediastinum. No abnormal lung uptake. Incidental CT finding: Scattered vascular calcifications are identified including along the coronary arteries. Heart is normal in size. No significant pericardial effusion. Thoracic aorta is normal course and caliber with scattered calcified plaque. Motion. Slightly patulous thoracic esophagus. Mild dependent atelectasis. No consolidation, pneumothorax or effusion. ABDOMEN/PELVIS Prostate: No focal activity in the prostate bed. Lymph nodes: No abnormal radiotracer accumulation within pelvic or abdominal nodes. Liver: No evidence of liver metastasis. Incidental CT finding: Grossly, there multiple small hepatic cystic foci. Gallbladder is nondilated. Few punctate calcifications in the dome liver. The spleen, adrenal glands and pancreas are grossly preserved. No abnormal calcifications seen within either kidney nor along the course of either ureter. Bladder is underdistended. Large bowel is nondilated with scattered stool. Extensive colonic diverticula. Normal retrocecal appendix. There is wall thickening, possible circular muscle  hypertrophy in the sigmoid colon. The sigmoid colon is underdistended. The stomach and small bowel is nondilated. Distal small bowel stool appearance seen, nonspecific. No free air or free fluid.  SKELETON No focal activity to suggest skeletal metastasis. Scattered degenerative changes along the spine and pelvis. Fixation hardware along the lumbar spine with screws extending into the sacrum and iliac bones. IMPRESSION: No areas of abnormal radiotracer uptake to suggest recurrent or metastatic disease. Electronically Signed   By: Adrianna Horde M.D.   On: 01/15/2024 17:07    Medications: I have reviewed the patient's current medications.   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  CHF 3.  Hyperlipidemia 4.  Lumbar disc disease 5.  Iron deficiency anemia August 2023 Low ferritin 12/20/2022, 02/05/2023 01/20/2023-stool Hemoccult cards negative Colonoscopy 07/09/2018-polyps removed from the ascending and transverse colon-tubular adenomas 6.  History of Kidney stones 7.  Hypertension 8.  L4-S1 laminectomy and fusion 03/03/2023     Disposition: Mr. Staver has been maintained on androgen deprivation therapy since 2019 when he was diagnosed with prostate cancer.  He was felt to have metastatic disease involving retroperitoneal/pelvic lymph nodes.  He is in clinical remission with an undetectable PSA.  The restaging PSMA PET reveals no evidence of prostate cancer.  I discussed treatment options with Mr. Balagot including continuing the current therapy, intermittent androgen deprivation therapy, and referring him for definitive radiation.  I explained there is some data suggesting an inferior outcome  with intermittent as compared to continuous androgen deprivation therapy in patients with advanced prostate cancer.  He understands he most likely had metastatic disease at presentation, but this has not been confirmed.  We discussed the risk of developing recurrent prostate cancer while off of androgen deprivation therapy.  We discussed the potential for cure if he were to receive radiation.  I offered him a referral to radiation oncology.  He does not wish to see the radiation oncologist at present.  He feels most comfortable continuing the current treatment.  He will return for an office visit and Lupron  in August.  Coni Deep, MD  01/16/2024  1:03 PM

## 2024-01-20 DIAGNOSIS — M5459 Other low back pain: Secondary | ICD-10-CM | POA: Diagnosis not present

## 2024-01-23 DIAGNOSIS — M5459 Other low back pain: Secondary | ICD-10-CM | POA: Diagnosis not present

## 2024-01-27 DIAGNOSIS — M5459 Other low back pain: Secondary | ICD-10-CM | POA: Diagnosis not present

## 2024-01-30 DIAGNOSIS — M5459 Other low back pain: Secondary | ICD-10-CM | POA: Diagnosis not present

## 2024-02-03 DIAGNOSIS — M5459 Other low back pain: Secondary | ICD-10-CM | POA: Diagnosis not present

## 2024-02-06 DIAGNOSIS — M5459 Other low back pain: Secondary | ICD-10-CM | POA: Diagnosis not present

## 2024-02-10 DIAGNOSIS — M5459 Other low back pain: Secondary | ICD-10-CM | POA: Diagnosis not present

## 2024-02-13 ENCOUNTER — Other Ambulatory Visit: Payer: Self-pay

## 2024-02-13 DIAGNOSIS — M5459 Other low back pain: Secondary | ICD-10-CM | POA: Diagnosis not present

## 2024-02-13 NOTE — Progress Notes (Signed)
 Specialty Pharmacy Refill Coordination Note  Bobby [name]. is a 73 y.o. male contacted today regarding refills of specialty medication(s) Abiraterone  Acetate (ZYTIGA )   Patient requested Delivery   Delivery date: 02/18/24   Verified address: [address], [place], [place]   Medication will be filled on 02/17/24.

## 2024-02-17 DIAGNOSIS — M5459 Other low back pain: Secondary | ICD-10-CM | POA: Diagnosis not present

## 2024-02-20 DIAGNOSIS — M5459 Other low back pain: Secondary | ICD-10-CM | POA: Diagnosis not present

## 2024-02-27 DIAGNOSIS — M5459 Other low back pain: Secondary | ICD-10-CM | POA: Diagnosis not present

## 2024-03-01 DIAGNOSIS — M4326 Fusion of spine, lumbar region: Secondary | ICD-10-CM | POA: Diagnosis not present

## 2024-03-01 DIAGNOSIS — Z1330 Encounter for screening examination for mental health and behavioral disorders, unspecified: Secondary | ICD-10-CM | POA: Diagnosis not present

## 2024-03-01 DIAGNOSIS — M5416 Radiculopathy, lumbar region: Secondary | ICD-10-CM | POA: Diagnosis not present

## 2024-03-02 DIAGNOSIS — M5459 Other low back pain: Secondary | ICD-10-CM | POA: Diagnosis not present

## 2024-03-05 DIAGNOSIS — M5459 Other low back pain: Secondary | ICD-10-CM | POA: Diagnosis not present

## 2024-03-09 ENCOUNTER — Other Ambulatory Visit (HOSPITAL_COMMUNITY): Payer: Self-pay

## 2024-03-09 ENCOUNTER — Other Ambulatory Visit: Payer: Self-pay

## 2024-03-09 ENCOUNTER — Other Ambulatory Visit: Payer: Self-pay | Admitting: Oncology

## 2024-03-09 DIAGNOSIS — M5459 Other low back pain: Secondary | ICD-10-CM | POA: Diagnosis not present

## 2024-03-09 DIAGNOSIS — C61 Malignant neoplasm of prostate: Secondary | ICD-10-CM

## 2024-03-09 MED ORDER — ABIRATERONE ACETATE 250 MG PO TABS
1000.0000 mg | ORAL_TABLET | Freq: Every day | ORAL | 2 refills | Status: DC
  Filled 2024-03-09: qty 120, 30d supply, fill #0
  Filled 2024-04-09 – 2024-04-15 (×2): qty 120, 30d supply, fill #1
  Filled 2024-05-14: qty 120, 30d supply, fill #2

## 2024-03-09 NOTE — Progress Notes (Signed)
 Specialty Pharmacy Refill Coordination Note  Bobby [name]. is a 73 y.o. male contacted today regarding refills of specialty medication(s) Abiraterone  Acetate (ZYTIGA )   Patient requested Delivery   Delivery date: 03/19/24   Verified address: [address], [place], [place]   Medication will be filled on 03/18/24. This fill date is pending response to refill request from provider. Patient is aware and if they have not received fill by intended date they must follow up with pharmacy.

## 2024-03-09 NOTE — Progress Notes (Signed)
 Specialty Pharmacy Ongoing Clinical Assessment Note  Bobby [name]. is a 73 y.o. male who is being followed by the specialty pharmacy service for RxSp Oncology   Patient's specialty medication(s) reviewed today: Abiraterone  Acetate (ZYTIGA )   Missed doses in the last 4 weeks: 0   Patient/Caregiver did not have any additional questions or concerns.   Therapeutic benefit summary: Patient is achieving benefit   Adverse events/side effects summary: Experienced adverse events/side effects (hot flashes and fatigue, both of which are tolerable)   Patient's therapy is appropriate to: Continue    Goals Addressed             This Visit's Progress    Slow Disease Progression   On track    Patient is on track. Patient will maintain adherence. PSA remains undetectable.          Follow up: 6 months  Silvano LOISE Dolly Specialty Pharmacist

## 2024-03-17 DIAGNOSIS — M5459 Other low back pain: Secondary | ICD-10-CM | POA: Diagnosis not present

## 2024-03-18 ENCOUNTER — Other Ambulatory Visit: Payer: Self-pay

## 2024-03-24 DIAGNOSIS — M5459 Other low back pain: Secondary | ICD-10-CM | POA: Diagnosis not present

## 2024-03-26 DIAGNOSIS — M5459 Other low back pain: Secondary | ICD-10-CM | POA: Diagnosis not present

## 2024-03-30 DIAGNOSIS — M5459 Other low back pain: Secondary | ICD-10-CM | POA: Diagnosis not present

## 2024-04-02 ENCOUNTER — Other Ambulatory Visit (HOSPITAL_COMMUNITY): Payer: Self-pay

## 2024-04-02 DIAGNOSIS — M5459 Other low back pain: Secondary | ICD-10-CM | POA: Diagnosis not present

## 2024-04-06 DIAGNOSIS — M5459 Other low back pain: Secondary | ICD-10-CM | POA: Diagnosis not present

## 2024-04-09 ENCOUNTER — Other Ambulatory Visit: Payer: Self-pay

## 2024-04-11 IMAGING — CT CT ABD-PELV W/ CM
2 of 5 series · 16 of 46 positions shown, 18 images · IV contrast (agent unspecified)
Comparison: 05/21/2017

CLINICAL DATA: Nausea, vomiting, lower abdominal and pelvic pain

EXAM:
CT ABDOMEN AND PELVIS WITH CONTRAST
TECHNIQUE: Multidetector CT imaging of the abdomen and pelvis was performed
using the standard protocol following bolus administration of
intravenous contrast.

[Series 2: axial st · axial · 0.95mm/px · z∈[+986,+1426]mm · 13 of 102 slices shown, 15 images]
[im 7/102  soft-tissue]
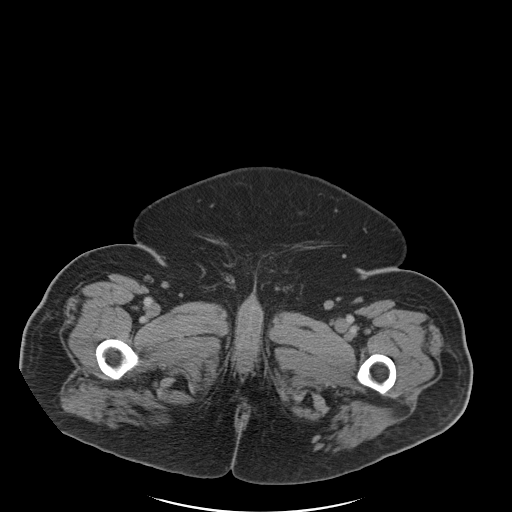
[im 7/102  bone]
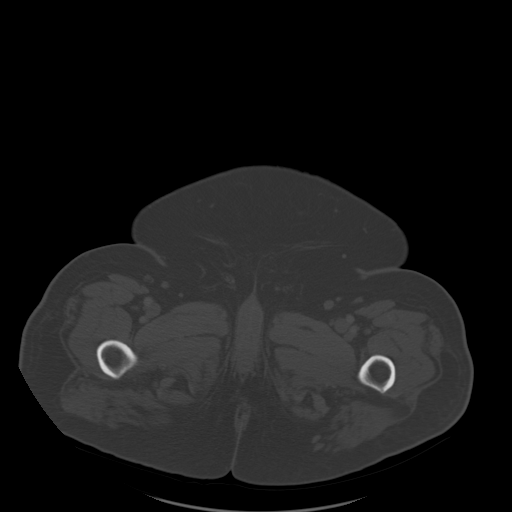
[im 14/102  soft-tissue]
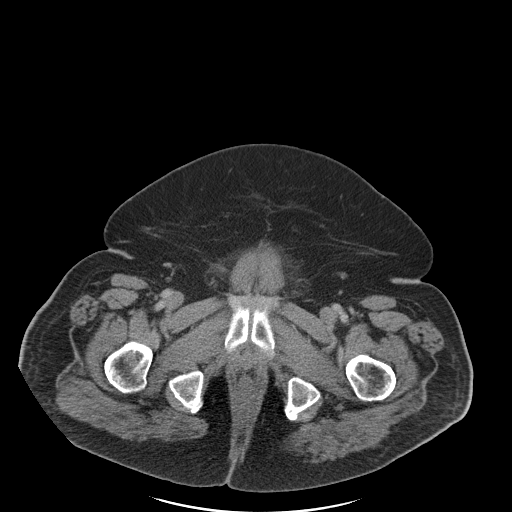
[im 21/102  soft-tissue]
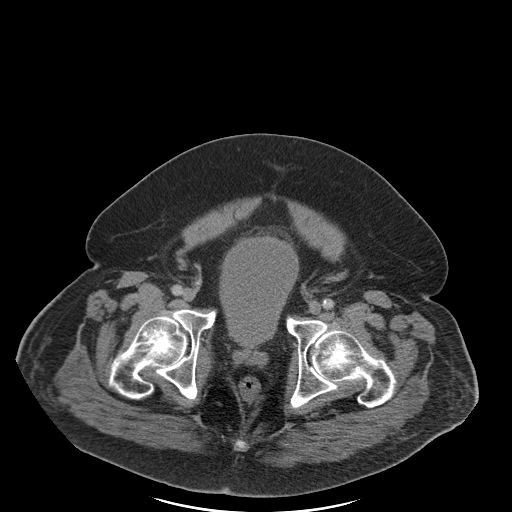
[im 27/102  soft-tissue]
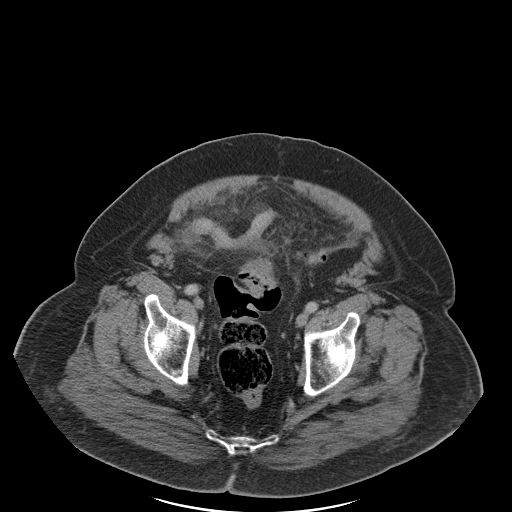
[im 34/102  soft-tissue]
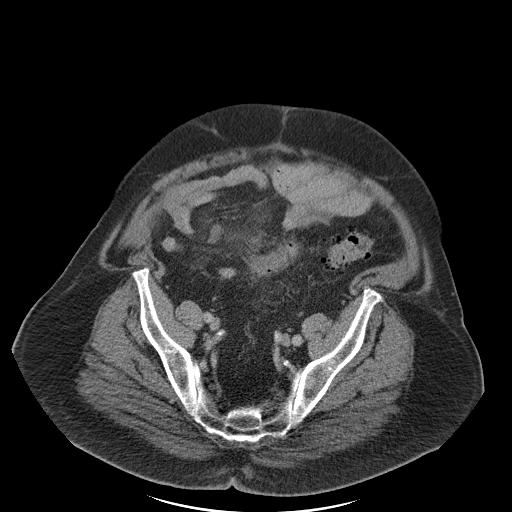
[im 41/102  soft-tissue]
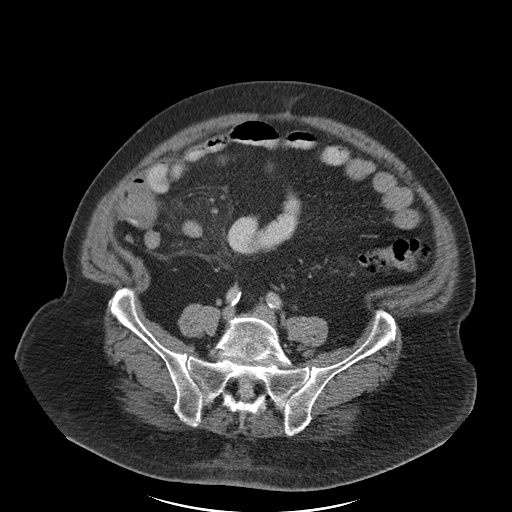
[im 54/102  soft-tissue]
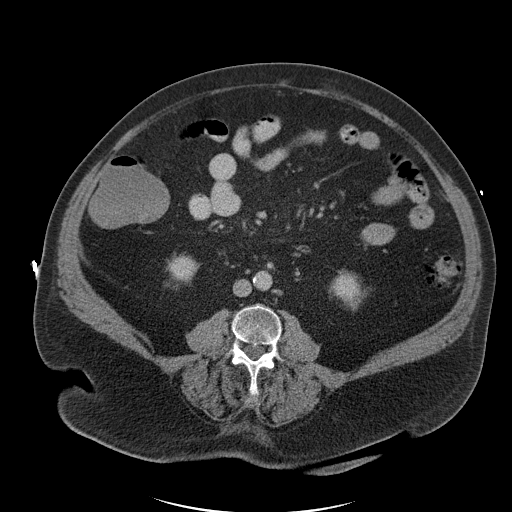
[im 61/102  soft-tissue]
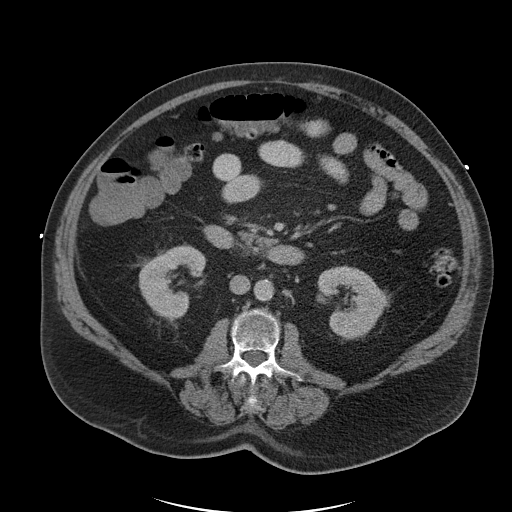
[im 68/102  soft-tissue]
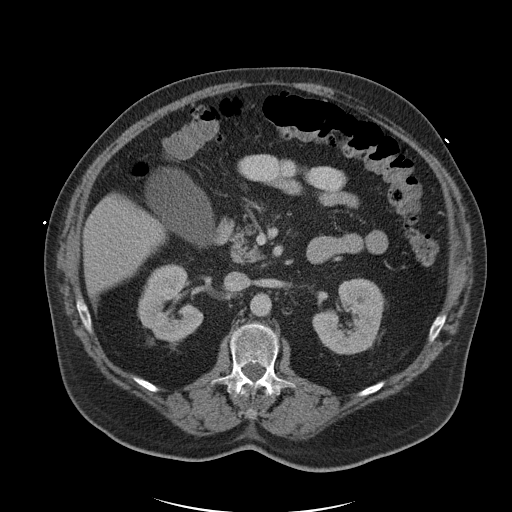
[im 68/102  bone]
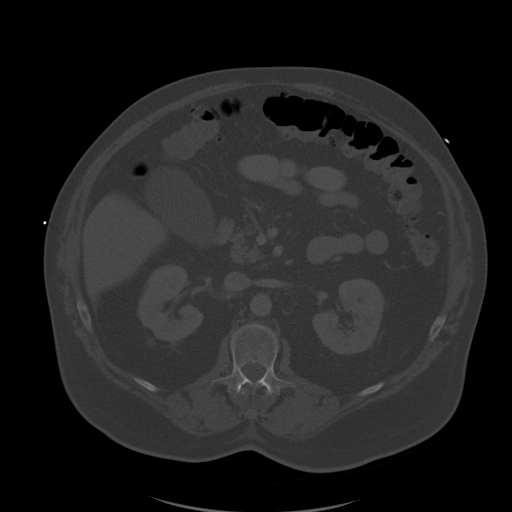
[im 75/102  soft-tissue]
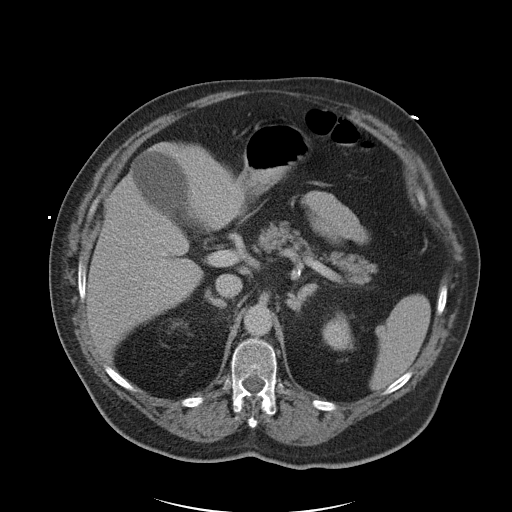
[im 81/102  soft-tissue]
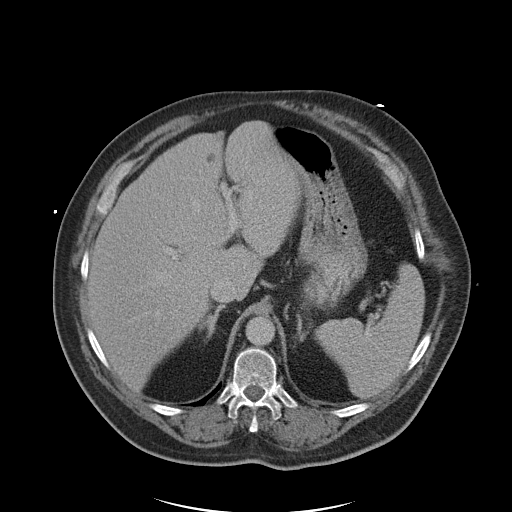
[im 88/102  soft-tissue]
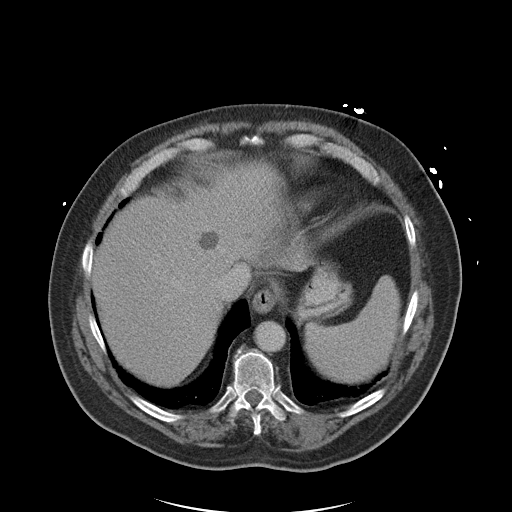
[im 95/102  soft-tissue]
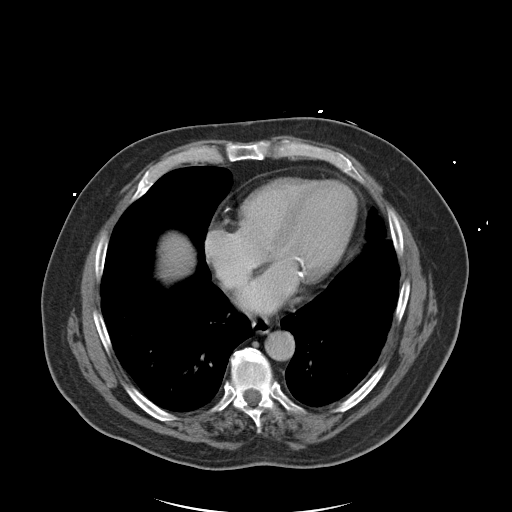

[Series 5: coronal st · coronal · 0.96mm/px · 3 of 203 slices shown]
[im 68/203  soft-tissue]
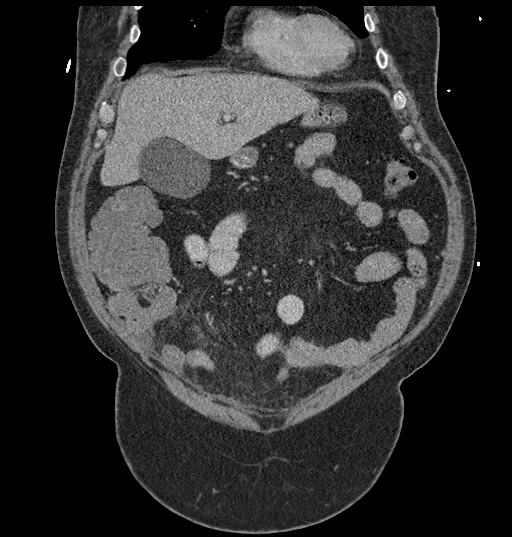
[im 90/203  soft-tissue]
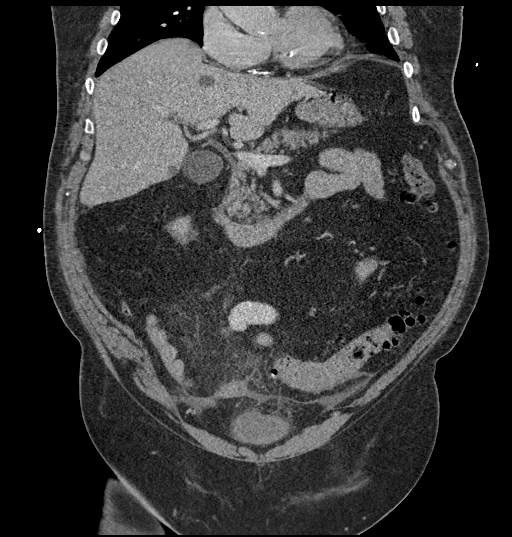
[im 113/203  soft-tissue]
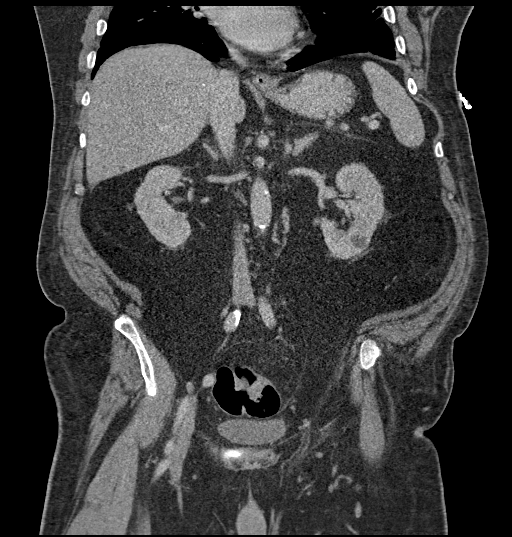

[16 of 46 positions shown; findings below may reference images not displayed]

RADIATION DOSE REDUCTION: This exam was performed according to the
departmental dose-optimization program which includes automated
exposure control, adjustment of the mA and/or kV according to
patient size and/or use of iterative reconstruction technique.

CONTRAST:  100mL OMNIPAQUE IOHEXOL 300 MG/ML  SOLN
FINDINGS: Lower chest: Bibasilar subpleural atelectasis versus scarring.
Normal heart size. Native coronary atherosclerosis. No pericardial
or pleural effusion.

Hepatobiliary: Anterior right hepatic dome punctate calcified
granulomata noted. Scattered hepatic hypodense cysts in the left
lobe, largest measures 19 mm, image [DATE]. No biliary dilatation or
obstruction pattern. Mild distension of the gallbladder but no
surrounding inflammation or free fluid. Common bile duct nondilated.

Pancreas: Unremarkable. No pancreatic ductal dilatation or
surrounding inflammatory changes.

Spleen: Normal in size without focal abnormality.

Adrenals/Urinary Tract: Normal adrenal glands. No renal obstruction
or hydronephrosis. Left kidney cortical hypodense cysts noted,
largest in the lower pole measures 2 cm. No further imaging
follow-up recommended.

No hydroureter or ureteral calculus.  Bladder unremarkable.

Stomach/Bowel: Negative for bowel obstruction, significant
dilatation, ileus, or free air. Normal retrocecal appendix.

In the lower abdomen and upper pelvis, there is pericolonic and
anterior mesenteric strandy edema/inflammation and sigmoid
diverticular disease. Findings compatible with acute sigmoid
diverticulitis. No fluid collection or abscess. No associated
obstruction.

Vascular/Lymphatic: Aorta atherosclerotic. Negative for aneurysm. No
dissection or occlusive process. Mesenteric and renal vasculature
appear patent. No Quadro Folhado finding or adenopathy.

Reproductive: No significant finding by CT

Other: No abdominal wall hernia or abnormality. No abdominopelvic
ascites.

Musculoskeletal: Degenerative changes of the lumbosacral spine. L5
bilateral pars defects with grade 1 anterolisthesis of L5 upon S1
measuring 7 mm. No acute compression fracture.
IMPRESSION: Acute sigmoid diverticulitis in the midline of the lower abdomen and
upper pelvis. No associated obstruction, fluid collection, or
abscess.

Bilateral L5 pars defects and L5 on S1 anterolisthesis as above

Aortic Atherosclerosis ([id number]-SHS.S).

## 2024-04-13 DIAGNOSIS — M5459 Other low back pain: Secondary | ICD-10-CM | POA: Diagnosis not present

## 2024-04-15 ENCOUNTER — Other Ambulatory Visit: Payer: Self-pay

## 2024-04-16 DIAGNOSIS — M5459 Other low back pain: Secondary | ICD-10-CM | POA: Diagnosis not present

## 2024-04-19 ENCOUNTER — Other Ambulatory Visit: Payer: Self-pay

## 2024-04-19 NOTE — Progress Notes (Signed)
 Specialty Pharmacy Refill Coordination Note  Bobby [name]. is a 73 y.o. male contacted today regarding refills of specialty medication(s) Abiraterone  Acetate (ZYTIGA )   Patient requested Delivery   Delivery date: 04/21/24   Verified address: [address], [place], [place]   Medication will be filled on 04/20/24.

## 2024-04-26 DIAGNOSIS — M4326 Fusion of spine, lumbar region: Secondary | ICD-10-CM | POA: Diagnosis not present

## 2024-04-26 DIAGNOSIS — M5416 Radiculopathy, lumbar region: Secondary | ICD-10-CM | POA: Diagnosis not present

## 2024-04-27 DIAGNOSIS — M5459 Other low back pain: Secondary | ICD-10-CM | POA: Diagnosis not present

## 2024-04-29 ENCOUNTER — Ambulatory Visit: Payer: Self-pay | Admitting: Oncology

## 2024-04-29 ENCOUNTER — Telehealth: Payer: Self-pay | Admitting: *Deleted

## 2024-04-29 ENCOUNTER — Inpatient Hospital Stay: Attending: Oncology

## 2024-04-29 ENCOUNTER — Ambulatory Visit (HOSPITAL_BASED_OUTPATIENT_CLINIC_OR_DEPARTMENT_OTHER): Admitting: Oncology

## 2024-04-29 ENCOUNTER — Ambulatory Visit

## 2024-04-29 VITALS — BP 114/56 | HR 72 | Temp 97.8°F | Resp 18 | Ht 66.0 in | Wt 236.0 lb

## 2024-04-29 DIAGNOSIS — C775 Secondary and unspecified malignant neoplasm of intrapelvic lymph nodes: Secondary | ICD-10-CM | POA: Insufficient documentation

## 2024-04-29 DIAGNOSIS — Z5111 Encounter for antineoplastic chemotherapy: Secondary | ICD-10-CM | POA: Insufficient documentation

## 2024-04-29 DIAGNOSIS — C61 Malignant neoplasm of prostate: Secondary | ICD-10-CM

## 2024-04-29 DIAGNOSIS — D509 Iron deficiency anemia, unspecified: Secondary | ICD-10-CM | POA: Insufficient documentation

## 2024-04-29 LAB — CBC WITH DIFFERENTIAL (CANCER CENTER ONLY)
Abs Immature Granulocytes: 0.03 K/uL (ref 0.00–0.07)
Basophils Absolute: 0.1 K/uL (ref 0.0–0.1)
Basophils Relative: 1 %
Eosinophils Absolute: 0.1 K/uL (ref 0.0–0.5)
Eosinophils Relative: 1 %
HCT: 44.4 % (ref 39.0–52.0)
Hemoglobin: 14.2 g/dL (ref 13.0–17.0)
Immature Granulocytes: 0 %
Lymphocytes Relative: 6 %
Lymphs Abs: 0.7 K/uL (ref 0.7–4.0)
MCH: 25.4 pg — ABNORMAL LOW (ref 26.0–34.0)
MCHC: 32 g/dL (ref 30.0–36.0)
MCV: 79.4 fL — ABNORMAL LOW (ref 80.0–100.0)
Monocytes Absolute: 0.4 K/uL (ref 0.1–1.0)
Monocytes Relative: 4 %
Neutro Abs: 8.9 K/uL — ABNORMAL HIGH (ref 1.7–7.7)
Neutrophils Relative %: 88 %
Platelet Count: 230 K/uL (ref 150–400)
RBC: 5.59 MIL/uL (ref 4.22–5.81)
RDW: 16.5 % — ABNORMAL HIGH (ref 11.5–15.5)
WBC Count: 10.2 K/uL (ref 4.0–10.5)
nRBC: 0 % (ref 0.0–0.2)

## 2024-04-29 LAB — CMP (CANCER CENTER ONLY)
ALT: 12 U/L (ref 0–44)
AST: 15 U/L (ref 15–41)
Albumin: 4.4 g/dL (ref 3.5–5.0)
Alkaline Phosphatase: 122 U/L (ref 38–126)
Anion gap: 14 (ref 5–15)
BUN: 17 mg/dL (ref 8–23)
CO2: 21 mmol/L — ABNORMAL LOW (ref 22–32)
Calcium: 10.1 mg/dL (ref 8.9–10.3)
Chloride: 105 mmol/L (ref 98–111)
Creatinine: 1.01 mg/dL (ref 0.61–1.24)
GFR, Estimated: >60 mL/min (ref >60)
Glucose, Bld: 134 mg/dL — ABNORMAL HIGH (ref 70–99)
Potassium: 4.4 mmol/L (ref 3.5–5.1)
Sodium: 141 mmol/L (ref 135–145)
Total Bilirubin: 0.6 mg/dL (ref 0.0–1.2)
Total Protein: 7 g/dL (ref 6.5–8.1)

## 2024-04-29 LAB — FERRITIN: Ferritin: 16 ng/mL — ABNORMAL LOW (ref 24–336)

## 2024-04-29 MED ORDER — LEUPROLIDE ACETATE (4 MONTH) 30 MG ~~LOC~~ KIT
30.0000 mg | PACK | Freq: Once | SUBCUTANEOUS | Status: AC
  Administered 2024-04-29: 30 mg via SUBCUTANEOUS
  Filled 2024-04-29: qty 30

## 2024-04-29 NOTE — Patient Instructions (Signed)
 Leuprolide Emulsion for Injection What is this medication? LEUPROLIDE (loo PROE lide) reduces the symptoms of prostate cancer. It works by decreasing levels of the hormone testosterone in the body. This prevents prostate cancer cells from spreading or growing. This medicine may be used for other purposes; ask your health care provider or pharmacist if you have questions. COMMON BRAND NAME(S): CAMCEVI What should I tell my care team before I take this medication? They need to know if you have any of these conditions: Diabetes Heart disease Heart failure High or low levels of electrolytes, such as magnesium, potassium, or sodium in your blood Irregular heartbeat or rhythm Seizures An unusual or allergic reaction to leuprolide, other medications, foods, dyes, or preservatives Pregnant or trying to get pregnant Breastfeeding How should I use this medication? This medication is injected under the skin. It is given by your care team in a hospital or clinic setting. Talk to your care team about the use of this medication in children. Special care may be needed. Overdosage: If you think you have taken too much of this medicine contact a poison control center or emergency room at once. NOTE: This medicine is only for you. Do not share this medicine with others. What if I miss a dose? Keep appointments for follow-up doses. It is important not to miss your dose. Call your care team if you are unable to keep an appointment. What may interact with this medication? Do not take this medication with any of the following: Cisapride Dronedarone Ketoconazole Levoketoconazole Pimozide Thioridazine This medication may also interact with the following: Other medications that cause heart rhythm changes This list may not describe all possible interactions. Give your health care provider a list of all the medicines, herbs, non-prescription drugs, or dietary supplements you use. Also tell them if you smoke,  drink alcohol, or use illegal drugs. Some items may interact with your medicine. What should I watch for while using this medication? Visit your care team for regular checks on your progress. Tell your care team if your symptoms do not start to get better or if they get worse. This medication may increase blood sugar. The risk may be higher in patients who already have diabetes. Ask your care team what you can do to lower the risk of diabetes while taking this medication. This medication may cause infertility. Talk to your care team if you are concerned about your fertility. Heart attacks and strokes have been reported with the use of this medication. Get emergency help if you develop signs or symptoms of a heart attack or stroke. Talk to your care team about the risks and benefits of this medication. What side effects may I notice from receiving this medication? Side effects that you should report to your care team as soon as possible: Allergic reactions--skin rash, itching, hives, swelling of the face, lips, tongue, or throat Heart attack--pain or tightness in the chest, shoulders, arms, or jaw, nausea, shortness of breath, cold or clammy skin, feeling faint or lightheaded Heart rhythm changes--fast or irregular heartbeat, dizziness, feeling faint or lightheaded, chest pain, trouble breathing High blood sugar (hyperglycemia)--increased thirst or amount of urine, unusual weakness or fatigue, blurry vision New or worsening seizures Redness, blistering, peeling, or loosening of the skin, including inside the mouth Stroke--sudden numbness or weakness of the face, arm, or leg, trouble speaking, confusion, trouble walking, loss of balance or coordination, dizziness, severe headache, change in vision Swelling and pain of the tumor site or lymph nodes Side effects that  usually do not require medical attention (report these to your care team if they continue or are bothersome): Change in sex drive or  performance Hot flashes Joint pain Pain, redness, or irritation at injection site Swelling of the ankles, hands, or feet Unusual weakness or fatigue This list may not describe all possible side effects. Call your doctor for medical advice about side effects. You may report side effects to FDA at 1-800-FDA-1088. Where should I keep my medication? This medication is given in a hospital or clinic. It will not be stored at home. NOTE: This sheet is a summary. It may not cover all possible information. If you have questions about this medicine, talk to your doctor, pharmacist, or health care provider.  2024 Elsevier/Gold Standard (2023-08-08 00:00:00)

## 2024-04-29 NOTE — Telephone Encounter (Signed)
 Patient gave verbal understanding and had no further questions or concerns

## 2024-04-29 NOTE — Telephone Encounter (Signed)
 Informed Bobby [name] that his ferritin is still low and Dr. Cloretta suggests he reach out to GI to be seen to evaluate. He reports going to Dr. Lennard w/[facility] last in 2018. He reports always having low iron. Told him that this is Dr. Andriette recommendation. Placed new referral for [facility] and faxed records. Per Dr. Cloretta also needs referral to RadOnc to consider prostate/pelvic RT. Referral order placed for Dr. Patrcia to see.

## 2024-04-29 NOTE — Progress Notes (Signed)
  [facility] OFFICE PROGRESS NOTE   Diagnosis: Prostate cancer  INTERVAL HISTORY:   Bobby [name] returns as scheduled.  He reports malaise.  Back pain has improved.  He is participating in physical therapy.  He continues abiraterone .  He is due for a Lupron  injection today.  No hot flashes.  Objective:  Vital signs in last 24 hours:  Blood pressure (!) 114/56, pulse 72, temperature 97.8 F (36.6 C), temperature source Temporal, resp. rate 18, height 5' 6 (1.676 m), weight 236 lb (107 kg), SpO2 100%.    HEENT: No thrush Lymphatics: No cervical, supraclavicular, axillary, or inguinal nodes Resp: Lungs clear bilaterally Cardio: Regular rate and rhythm, 2/6 systolic murmur GI: No hepatosplenomegaly Vascular: Trace edema to left lower leg with pretibial discoloration (he reports this is a chronic finding following a left leg fracture)   Lab Results:  Lab Results  Component Value Date   WBC 10.2 04/29/2024   HGB 14.2 04/29/2024   HCT 44.4 04/29/2024   MCV 79.4 (L) 04/29/2024   PLT 230 04/29/2024   NEUTROABS 8.9 (H) 04/29/2024    CMP  Lab Results  Component Value Date   NA 141 04/29/2024   K 4.4 04/29/2024   CL 105 04/29/2024   CO2 21 (L) 04/29/2024   GLUCOSE 134 (H) 04/29/2024   BUN 17 04/29/2024   CREATININE 1.01 04/29/2024   CALCIUM  10.1 04/29/2024   PROT 7.0 04/29/2024   ALBUMIN 4.4 04/29/2024   AST 15 04/29/2024   ALT 12 04/29/2024   ALKPHOS 122 04/29/2024   BILITOT 0.6 04/29/2024   GFRNONAA >60 04/29/2024   GFRAA 81 09/29/2020    No results found for: CEA1, CEA, CAN199, CA125  Lab Results  Component Value Date   INR 1.0 05/06/2020   LABPROT 13.1 05/06/2020    Imaging:  No results found.  Medications: I have reviewed the patient's current medications.   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  CHF 3.  Hyperlipidemia 4.  Lumbar disc disease 5.  Iron deficiency anemia August 2023 Low ferritin 12/20/2022, 02/05/2023 01/20/2023-stool Hemoccult cards negative Colonoscopy 07/09/2018-polyps removed from the ascending and transverse colon-tubular adenomas 6.  History of Kidney stones 7.  Hypertension 8.  L4-S1 laminectomy and fusion 03/03/2023       Disposition: Bobby [name] is in clinical remission from prostate cancer.  He continues treatment with abiraterone /leuprolide .  He is due for a leuprolide  injection today.  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  He agrees to a consultation with Dr. Patrcia to see whether he may be a candidate for definitive radiation to the prostate and pelvic lymph nodes.  A PSMA PET was on 01/15/2024.  He will return for an office visit and leuprolide  in 4 months.  He has a history of iron deficiency anemia.  He continues to have Red cell microcytosis.  We will follow-up on the ferritin level from today.  He has not scheduled an appointment with gastroenterology.  I recommend he schedule a follow-up visit to evaluate the iron deficiency.  Arley Hof, MD  04/29/2024  12:15 PM

## 2024-04-29 NOTE — Telephone Encounter (Signed)
-----   Message from Arley Hof sent at 04/29/2024  2:01 PM EDT ----- Please call patient, the iron level is still low, he should follow-up with gastroenterology for evaluation of iron deficiency  ----- Message ----- From: Rebecka, Lab In [place] Sent: 04/29/2024  11:49 AM EDT To: Arley KATHEE Hof, MD

## 2024-04-30 LAB — PROSTATE-SPECIFIC AG, SERUM (LABCORP): Prostate Specific Ag, Serum: <0.1 ng/mL (ref 0.0–4.0)

## 2024-05-04 DIAGNOSIS — M5459 Other low back pain: Secondary | ICD-10-CM | POA: Diagnosis not present

## 2024-05-07 DIAGNOSIS — M5459 Other low back pain: Secondary | ICD-10-CM | POA: Diagnosis not present

## 2024-05-11 DIAGNOSIS — M5459 Other low back pain: Secondary | ICD-10-CM | POA: Diagnosis not present

## 2024-05-12 NOTE — Progress Notes (Signed)
 GU Location of Tumor / Histology: Prostate Ca  If Prostate Cancer, Gleason Score is (4 + 5) and PSA is (<0.1 on 04/29/2024 )  PSA 14.7 on 04/2017  Elsie Donnel Abigail Mickey. presented as referral from Dr. Arley Hof ([facility])  Biopsies      01/15/2024 Dr. Arley Hof NM PET (PSMA) Skull to Mid Thigh CLINICAL DATA:  Initial treatment strategy for metastatic prostate cancer. Gleason 7. PSA less than 0.141725. IMPRESSION: No areas of abnormal radiotracer uptake to suggest recurrent or metastatic disease.  Past/Anticipated interventions by urology, if any: NA  Past/Anticipated interventions by medical oncology, if any:  Dr. Arley Hof   Weight changes, if any: No  IPSS:  9 SHIM: 0 no sexual activity  Bowel/Bladder complaints, if any:  No  Nausea/Vomiting, if any: No  Pain issues, if any:  0/10  SAFETY ISSUES: Prior radiation?  No Pacemaker/ICD? No Possible current pregnancy? Male Is the patient on methotrexate? No  Current Complaints / other details:  None  30 minutes spent total, including time for meaningful use questions, reviewing medication, as well as spent in face-to-face time in nurse evaluation with the patient.

## 2024-05-14 ENCOUNTER — Encounter (INDEPENDENT_AMBULATORY_CARE_PROVIDER_SITE_OTHER): Payer: Self-pay

## 2024-05-14 ENCOUNTER — Other Ambulatory Visit: Payer: Self-pay

## 2024-05-14 ENCOUNTER — Other Ambulatory Visit: Payer: Self-pay | Admitting: Pharmacy Technician

## 2024-05-14 NOTE — Progress Notes (Signed)
 Specialty Pharmacy Refill Coordination Note  Shown Dissinger. is a 73 y.o. male contacted today regarding refills of specialty medication(s) Abiraterone  Acetate (ZYTIGA )   Patient requested (Patient-Rptd) Delivery   Delivery date: 05/18/24 Verified address: (Patient-Rptd) [address], [place], [place]   Medication will be filled on 05/17/24.

## 2024-05-16 NOTE — Progress Notes (Signed)
 Radiation Oncology         [phone]) [phone] ________________________________  Initial outpatient Consultation - Conducted via telephone  Name: Bobby [name]. MRN: [id number]  Date of Service: 05/17/2024 DOB: 1951-04-12  RR:Dnluy, Garnette, MD  Cloretta Arley NOVAK, MD   REFERRING PHYSICIAN: Cloretta Arley NOVAK, MD  DIAGNOSIS: 73 y.o. man with metastatic prostate cancer***  No diagnosis found.  HISTORY OF PRESENT ILLNESS: Bobby Christopher. is a 73 y.o. male seen at the request of Dr. Cloretta. He has a history of metastatic prostate cancer. In summary, he was initially referred to Dr. Watt back in 04/2017 for an elevated PSA of 14.7. He was subsequently diagnosed with Gleason 4+5 prostate cancer on 04/24/17. Staging CT A/P on 05/21/17 revealed significant pelvic lymphadenopathy. He was evaluated in our multidisciplinary prostate cancer clinic on 05/23/17. The recommendation was to proceed with systemic therapy, with no indication for radiation therapy at that time due to the amount of metastatic disease. He was subsequently started on ADT with leuprolide  and abiraterone  with prednisone  in 06/2017 by Dr. Amadeo.   Since that time, he has continued ADT and abiraterone , currently under the care of Dr. Cloretta. His PSA has remained undetectable over the years. He recently underwent a restaging PMSA PET scan on 01/15/24 showing no evidence of active disease.  ***  PREVIOUS RADIATION THERAPY: No  PAST MEDICAL HISTORY:  Past Medical History:  Diagnosis Date   Bronchitis    CHF (congestive heart failure) (HCC) 04/2020   A/C HFpEF   Coronary artery calcification of native artery    Family history of pancreatic cancer    History of kidney stones    Hyperlipidemia    Hypertension    Pneumonia    Prostate cancer (HCC)       PAST SURGICAL HISTORY: Past Surgical History:  Procedure Laterality Date   EXTRACORPOREAL SHOCK WAVE LITHOTRIPSY Right 04/17/2017   Procedure: RIGHT EXTRACORPOREAL SHOCK  WAVE LITHOTRIPSY (ESWL);  Surgeon: Ottelin, Mark, MD;  Location: [facility];  Service: Urology;  Laterality: Right;   HERNIA REPAIR     age 53   LEFT HEART CATH AND CORONARY ANGIOGRAPHY N/A 05/08/2020   Procedure: LEFT HEART CATH AND CORONARY ANGIOGRAPHY;  Surgeon: Ladona Heinz, MD;  Location: [facility] INVASIVE [facility];  Service: Cardiovascular;  Laterality: N/A;   PROSTATE BIOPSY      FAMILY HISTORY:  Family History  Problem Relation Age of Onset   Breast cancer Sister 47       negative genetic testing   Pancreatic cancer Brother 91   Breast cancer Maternal Aunt    Kidney cancer Maternal Aunt        dx 30s    SOCIAL HISTORY:  Social History   Socioeconomic History   Marital status: Married    Spouse name: Bobby [name]   Number of children: 2   Years of education: Not on file   Highest education level: Not on file  Occupational History   Not on file  Tobacco Use   Smoking status: Former    Current packs/day: 0.00    Average packs/day: 0.3 packs/day for 10.0 years (2.5 ttl pk-yrs)    Types: Cigarettes    Start date: 01/19/2002    Quit date: 01/20/2012    Years since quitting: 12.3   Smokeless tobacco: Never  Vaping Use   Vaping status: Never Used  Substance and Sexual Activity   Alcohol use: Yes    Comment: occ   Drug use: No  Sexual activity: Yes  Other Topics Concern   Not on file  Social History Narrative   Not on file   Social Drivers of Health   Financial Resource Strain: Low Risk  (03/01/2024)   Received [facility]   Overall Financial Resource Strain (CARDIA)    Difficulty of Paying Living Expenses: Not hard at all  Food Insecurity: No Food Insecurity (03/01/2024)   Received from [facility]   Hunger Vital Sign    Within the past 12 months, you worried that your food would run out before you got the money to buy more.: Never true    Within the past 12 months, the food you bought just didn't last and you didn't have money to get more.: Never true  Transportation  Needs: No Transportation Needs (03/01/2024)   Received from [facility] - Transportation    Lack of Transportation (Medical): No    Lack of Transportation (Non-Medical): No  Physical Activity: Not on file  Stress: Not on file  Social Connections: Not on file  Intimate Partner Violence: Not At Risk (12/20/2022)   Humiliation, Afraid, Rape, and Kick questionnaire    Fear of Current or Ex-Partner: No    Emotionally Abused: No    Physically Abused: No    Sexually Abused: No    ALLERGIES: Patient has no known allergies.  MEDICATIONS:  Current Outpatient Medications  Medication Sig Dispense Refill   abiraterone  acetate (ZYTIGA ) 250 MG tablet Take 4 tablets (1,000 mg total) by mouth daily. Take on an empty stomach 1 hour before or 2 hours after a meal 120 tablet 2   aspirin  EC 81 MG tablet Take 1 tablet (81 mg total) by mouth daily. 90 tablet 3   atorvastatin  (LIPITOR ) 40 MG tablet TAKE ONE TABLET BY MOUTH (40 MG total) DAILY 90 tablet 2   diltiazem  (CARDIZEM  CD) 360 MG 24 hr capsule TAKE ONE CAPSULE BY MOUTH ONCE DAILY 90 capsule PRN   empagliflozin  (JARDIANCE ) 10 MG TABS tablet Take 1 tablet (10 mg total) by mouth daily before breakfast. 90 tablet 3   HYDROcodone -acetaminophen  (NORCO/VICODIN) 5-325 MG tablet Take 1 tablet by mouth every 4 (four) hours as needed.     lidocaine  (LIDODERM ) 5 % Place 1 patch onto the skin as needed.     predniSONE  (DELTASONE ) 5 MG tablet TAKE 1 Tablet BY MOUTH ONCE DAILY with BREAKFAST 90 tablet 3   pregabalin (LYRICA) 75 MG capsule Take 75 mg by mouth daily.     sacubitril -valsartan  (ENTRESTO ) 49-51 MG Take 1 tablet by mouth 2 (two) times daily. 180 tablet 2   solifenacin  (VESICARE ) 5 MG tablet Take 5 mg by mouth daily.     tamsulosin  (FLOMAX ) 0.4 MG CAPS capsule Take 1 capsule (0.4 mg total) by mouth daily after supper. 30 capsule 0   No current facility-administered medications for this encounter.    REVIEW OF SYSTEMS:  On review of systems,  the patient reports that he is doing well overall. He denies any chest pain, shortness of breath, cough, fevers, chills, night sweats, unintended weight changes. He denies any bowel or bladder disturbances, and denies abdominal pain, nausea or vomiting. He denies any new musculoskeletal or joint aches or pains.*** A complete review of systems is obtained and is otherwise negative.    PHYSICAL EXAM:  Wt Readings from Last 3 Encounters:  04/29/24 236 lb (107 kg)  01/16/24 223 lb (101.2 kg)  12/25/23 225 lb 8 oz (102.3 kg)   Temp  Readings from Last 3 Encounters:  04/29/24 97.8 F (36.6 C) (Temporal)  01/16/24 97.9 F (36.6 C) (Temporal)  12/25/23 98.1 F (36.7 C) (Temporal)   BP Readings from Last 3 Encounters:  04/29/24 (!) 114/56  01/16/24 130/68  12/25/23 130/61   Pulse Readings from Last 3 Encounters:  04/29/24 72  01/16/24 68  12/25/23 71    /10  In general this is a well appearing *** man in no acute distress. He's alert and oriented x4 and appropriate throughout the examination. Cardiopulmonary assessment is negative for acute distress and he exhibits normal effort.     KPS = ***  100 - Normal; no complaints; no evidence of disease. 90   - Able to carry on normal activity; minor signs or symptoms of disease. 80   - Normal activity with effort; some signs or symptoms of disease. 49   - Cares for self; unable to carry on normal activity or to do active work. 60   - Requires occasional assistance, but is able to care for most of his personal needs. 50   - Requires considerable assistance and frequent medical care. 40   - Disabled; requires special care and assistance. 30   - Severely disabled; hospital admission is indicated although death not imminent. 20   - Very sick; hospital admission necessary; active supportive treatment necessary. 10   - Moribund; fatal processes progressing rapidly. 0     - Dead  Karnofsky DA, Abelmann WH, Craver LS and Burchenal [facility] [phone]) The  use of the nitrogen mustards in the palliative treatment of carcinoma: with particular reference to bronchogenic carcinoma Cancer 1 634-56  LABORATORY DATA:  Lab Results  Component Value Date   WBC 10.2 04/29/2024   HGB 14.2 04/29/2024   HCT 44.4 04/29/2024   MCV 79.4 (L) 04/29/2024   PLT 230 04/29/2024   Lab Results  Component Value Date   NA 141 04/29/2024   K 4.4 04/29/2024   CL 105 04/29/2024   CO2 21 (L) 04/29/2024   Lab Results  Component Value Date   ALT 12 04/29/2024   AST 15 04/29/2024   ALKPHOS 122 04/29/2024   BILITOT 0.6 04/29/2024     RADIOGRAPHY: No results found.    IMPRESSION/PLAN: 1. 73 y.o. man with metastatic prostate cancer***  Today, we talked to the patient and family about the findings and workup thus far. We discussed the natural history of metastatic prostate cancer and general treatment, highlighting the role of radiotherapy in the management. We discussed the available radiation techniques, and focused on the details and logistics of delivery. We reviewed the anticipated acute and late sequelae associated with radiation in this setting. The patient was encouraged to ask questions that were answered to his satisfaction.  At the end of our discussion, the patient ***  Given current concerns for patient exposure during the COVID-19 pandemic, this encounter was conducted via telephone. The patient was notified in advance and was offered a WebEX meeting to allow for face to face communication but unfortunately reported that he did not have the appropriate resources/technology to support such a visit and instead preferred to proceed with telephone consult. The patient has given verbal consent for this type of encounter. The time spent during this encounter was *** minutes. The attendants for this meeting include Donnice Barge MD, Caragh Gasper PA-C, patient Bobby [name] {and ***.} During the encounter, Donnice Barge MD and Sabra Rusk  PA-C were located at [facility] Radiation  Oncology Department.  Patient Bobby [name]. {and *** were} was located at home.     Sabra MICAEL Rusk, PA-C    Donnice Barge, MD  [facility]  Radiation Oncology Direct Dial: [phone]  Fax: [phone] [email or web address]  Skype  LinkedIn   This document serves as a record of services personally performed by Donnice Barge, MD and Sabra Rusk, PA-C. It was created on their behalf by Izetta Neither, a trained medical scribe. The creation of this record is based on the scribe's personal observations and the provider's statements to them. This document has been checked and approved by the attending provider.

## 2024-05-17 ENCOUNTER — Ambulatory Visit
Admission: RE | Admit: 2024-05-17 | Discharge: 2024-05-17 | Disposition: A | Discharge status: Home / Self Care | Source: Ambulatory Visit | Attending: Radiation Oncology | Admitting: Radiation Oncology

## 2024-05-17 ENCOUNTER — Encounter: Payer: Self-pay | Admitting: Radiation Oncology

## 2024-05-17 ENCOUNTER — Other Ambulatory Visit: Payer: Self-pay

## 2024-05-17 DIAGNOSIS — C61 Malignant neoplasm of prostate: Secondary | ICD-10-CM

## 2024-05-17 DIAGNOSIS — M47816 Spondylosis without myelopathy or radiculopathy, lumbar region: Secondary | ICD-10-CM | POA: Insufficient documentation

## 2024-05-17 DIAGNOSIS — Z191 Hormone sensitive malignancy status: Secondary | ICD-10-CM | POA: Diagnosis not present

## 2024-05-17 DIAGNOSIS — M47817 Spondylosis without myelopathy or radiculopathy, lumbosacral region: Secondary | ICD-10-CM | POA: Insufficient documentation

## 2024-05-17 DIAGNOSIS — J449 Chronic obstructive pulmonary disease, unspecified: Secondary | ICD-10-CM | POA: Insufficient documentation

## 2024-05-18 DIAGNOSIS — M5459 Other low back pain: Secondary | ICD-10-CM | POA: Diagnosis not present

## 2024-05-20 ENCOUNTER — Telehealth: Payer: Self-pay | Admitting: Cardiology

## 2024-05-20 MED ORDER — SACUBITRIL-VALSARTAN 49-51 MG PO TABS: 1.0000 | ORAL_TABLET | Freq: Two times a day (BID) | ORAL | 0 refills | Status: DC

## 2024-05-20 NOTE — Telephone Encounter (Signed)
*  STAT* If patient is at the pharmacy, call can be transferred to refill team.   1. Which medications need to be refilled? (please list name of each medication and dose if known)   sacubitril -valsartan  (ENTRESTO ) 49-51 MG   NEW PHARMACY   4. Which pharmacy/location (including street and city if local pharmacy) is medication to be sent to? [facility] - [place], [place] - [address]. Phone: [phone]  Fax: [phone]       5. Do they need a 30 day or 90 day supply? 90

## 2024-05-20 NOTE — Telephone Encounter (Signed)
 Pt's medication was sent to pt's pharmacy as requested. Confirmation received.

## 2024-05-21 ENCOUNTER — Other Ambulatory Visit (HOSPITAL_COMMUNITY): Payer: Self-pay

## 2024-05-21 ENCOUNTER — Telehealth: Payer: Self-pay | Admitting: Pharmacy Technician

## 2024-05-21 NOTE — Telephone Encounter (Signed)
 Pharmacy Patient Advocate Encounter  Received notification from [facility] that Prior Authorization for Entresto  Bobby [name] has been DENIED.  Full denial letter will be uploaded to the media tab. See denial reason below.   PA #/Case ID/Reference #: [id number]

## 2024-05-21 NOTE — Telephone Encounter (Signed)
 Pharmacy Patient Advocate Encounter   Received notification from Fax that prior authorization for Entresto   is required/requested.   Insurance verification completed.   The patient is insured through [employer] .   Per test claim: PA required; PA submitted to above mentioned insurance via Latent Key/confirmation #/EOC [id number] Status is pending

## 2024-05-25 NOTE — Progress Notes (Signed)
 RN spoke with patient and introduced myself and my role.  Patient is still taking some times to consider his options, and wants to speak with his PCP before finalizing.  RN provided my direct line for any follow up questions or treatment decisions.

## 2024-05-26 ENCOUNTER — Encounter: Payer: Self-pay | Admitting: *Deleted

## 2024-05-26 NOTE — Progress Notes (Signed)
 Received notification from [facility] that they have left VM and sent letter to patient re: referral placed by Dr. Cloretta. MD made aware.

## 2024-05-27 ENCOUNTER — Other Ambulatory Visit: Payer: Self-pay | Admitting: Oncology

## 2024-06-07 DIAGNOSIS — C61 Malignant neoplasm of prostate: Secondary | ICD-10-CM | POA: Diagnosis not present

## 2024-06-07 DIAGNOSIS — R3121 Asymptomatic microscopic hematuria: Secondary | ICD-10-CM | POA: Diagnosis not present

## 2024-06-09 ENCOUNTER — Other Ambulatory Visit: Payer: Self-pay | Admitting: Oncology

## 2024-06-09 ENCOUNTER — Other Ambulatory Visit: Payer: Self-pay

## 2024-06-09 ENCOUNTER — Other Ambulatory Visit (HOSPITAL_COMMUNITY): Payer: Self-pay

## 2024-06-09 DIAGNOSIS — C61 Malignant neoplasm of prostate: Secondary | ICD-10-CM

## 2024-06-09 MED ORDER — ABIRATERONE ACETATE 250 MG PO TABS
1000.0000 mg | ORAL_TABLET | Freq: Every day | ORAL | 2 refills | Status: DC
  Filled 2024-06-10 – 2024-06-11 (×3): qty 120, 30d supply, fill #0
  Filled 2024-07-08 – 2024-07-15 (×2): qty 120, 30d supply, fill #1
  Filled 2024-08-17 – 2024-08-24 (×2): qty 120, 30d supply, fill #2

## 2024-06-10 ENCOUNTER — Other Ambulatory Visit (HOSPITAL_COMMUNITY): Payer: Self-pay

## 2024-06-10 ENCOUNTER — Other Ambulatory Visit: Payer: Self-pay

## 2024-06-10 NOTE — Progress Notes (Signed)
 RN spoke with patient to follow up after rad onc consult.   Patient recently had follow up with urology, and would like to continue with ADT therapy for now.  He is recovering from a recent back surgery and voiced he may consider radiation next year.  He does have an active follow up with his oncologist, Dr. Cloretta.   RN encouraged patient to call with any questions, or if he decide to pursue radiation treatment.  Verbalized understanding.

## 2024-06-11 ENCOUNTER — Other Ambulatory Visit: Payer: Self-pay

## 2024-06-11 ENCOUNTER — Telehealth: Payer: Self-pay | Admitting: *Deleted

## 2024-06-11 NOTE — Telephone Encounter (Signed)
 Called Mr. Olivera to f/u on referral to [facility]. He reports they did contact him, but he declined appointment at this time. Wants to get some other health issues taken care of and then he will call them back.

## 2024-06-11 NOTE — Progress Notes (Signed)
 Specialty Pharmacy Refill Coordination Note  Bobby [name]. is a 73 y.o. male contacted today regarding refills of specialty medication(s) Abiraterone  Acetate (ZYTIGA )   Patient requested Delivery   Delivery date: 06/16/24   Verified address: [address], [place], [address]   Medication will be filled on 06/15/24.

## 2024-06-15 ENCOUNTER — Other Ambulatory Visit: Payer: Self-pay

## 2024-06-15 ENCOUNTER — Other Ambulatory Visit (HOSPITAL_COMMUNITY): Payer: Self-pay

## 2024-06-15 ENCOUNTER — Encounter (INDEPENDENT_AMBULATORY_CARE_PROVIDER_SITE_OTHER): Payer: Self-pay

## 2024-06-15 NOTE — Telephone Encounter (Signed)
 Patient refill has already been established with Bobby [name] on 06/11/24. Patient's medication to be shipped on 06/15/24 for delivery on 06/16/24

## 2024-06-23 ENCOUNTER — Other Ambulatory Visit (HOSPITAL_COMMUNITY): Payer: Self-pay

## 2024-06-25 DIAGNOSIS — M5416 Radiculopathy, lumbar region: Secondary | ICD-10-CM | POA: Diagnosis not present

## 2024-06-25 DIAGNOSIS — M47816 Spondylosis without myelopathy or radiculopathy, lumbar region: Secondary | ICD-10-CM | POA: Diagnosis not present

## 2024-06-25 DIAGNOSIS — M4326 Fusion of spine, lumbar region: Secondary | ICD-10-CM | POA: Diagnosis not present

## 2024-06-30 DIAGNOSIS — U071 COVID-19: Secondary | ICD-10-CM | POA: Diagnosis not present

## 2024-06-30 DIAGNOSIS — R058 Other specified cough: Secondary | ICD-10-CM | POA: Diagnosis not present

## 2024-07-08 ENCOUNTER — Other Ambulatory Visit (HOSPITAL_COMMUNITY): Payer: Self-pay

## 2024-07-13 ENCOUNTER — Telehealth: Payer: Self-pay

## 2024-07-13 NOTE — Telephone Encounter (Signed)
 RN returned call to Bobby [name] whom had some questions about radiation treatment and billing.  RN advised Bobby [name] to call the billing department for billing questions.  Bobby [name] was given the number to the billing department to find out the cost of radiation treatment.  Bobby [name] did have some questions about side effects and what will he be dealing with when or if he starts radiation treatment the hold up is going to be the finances he reports at this time.  Bobby [name] was very appreciative of the call and the answers to the questions about the radiation side effects.  RN gave extension to Bobby [name] for any follow-up questions he may have in the future.

## 2024-07-15 ENCOUNTER — Other Ambulatory Visit: Payer: Self-pay

## 2024-07-20 ENCOUNTER — Other Ambulatory Visit: Payer: Self-pay

## 2024-07-20 NOTE — Progress Notes (Signed)
 Specialty Pharmacy Refill Coordination Note  Bobby Lefever. is a 73 y.o. male contacted today regarding refills of specialty medication(s) Abiraterone  Acetate (ZYTIGA )   Patient requested Delivery   Delivery date: 07/27/24   Verified address: [address] [place] [address]   Medication will be filled on: 07/26/24

## 2024-07-22 ENCOUNTER — Other Ambulatory Visit: Payer: Self-pay | Admitting: Cardiology

## 2024-07-26 ENCOUNTER — Other Ambulatory Visit: Payer: Self-pay

## 2024-07-28 ENCOUNTER — Encounter: Payer: Self-pay | Admitting: Cardiology

## 2024-07-28 ENCOUNTER — Ambulatory Visit: Attending: Cardiology | Admitting: Cardiology

## 2024-07-28 VITALS — BP 120/58 | HR 67 | Resp 16 | Ht 66.0 in | Wt 238.8 lb

## 2024-07-28 DIAGNOSIS — E782 Mixed hyperlipidemia: Secondary | ICD-10-CM

## 2024-07-28 DIAGNOSIS — I5032 Chronic diastolic (congestive) heart failure: Secondary | ICD-10-CM

## 2024-07-28 DIAGNOSIS — G4733 Obstructive sleep apnea (adult) (pediatric): Secondary | ICD-10-CM

## 2024-07-28 DIAGNOSIS — I2510 Atherosclerotic heart disease of native coronary artery without angina pectoris: Secondary | ICD-10-CM

## 2024-07-28 DIAGNOSIS — I10 Essential (primary) hypertension: Secondary | ICD-10-CM | POA: Diagnosis not present

## 2024-07-28 NOTE — Progress Notes (Signed)
 Cardiology Office Note:  .   Date:  07/28/2024  ID:  Bobby [name]., DOB 04-20-1951, MRN [id number] PCP:  Nichole Senior, MD  Former Cardiology Providers: None [facility] Providers Cardiologist:  Madonna Large, DO , [facility] (established care 01/21/2020) Electrophysiologist:  None  Click to update primary MD,subspecialty MD or APP then REFRESH:1}    Chief Complaint  Patient presents with   Chronic heart failure with preserved ejection fraction   Follow-up    History of Present Illness: .   Bobby [name]. is a 73 y.o. Caucasian male whose past medical history and cardiovascular risk factors includes: Nonobstructive coronary artery disease, severe coronary artery calcification 1101 AU, chronic HFpEF/stage C/NYHA class II, COVID infection (08/2021), prediabetes, hyperlipidemia, hypertension, advanced age, former smoker, obesity due to excess calories.   Patient is being followed in the practice given his history of severe CAC.  He is accompanied by his wife [name] at today's office visit.   Patient was last seen in October 2024 at that time he was stable from cardiovascular standpoint and had plans to undergo redo back surgery.  Patient was recommended to continue medical therapy he was given holding parameters for Entresto .  Patient presents today for 1 year follow-up visit  Since last office visit Mr. Dasan Hardman. denies any anginal chest pain or heart failure symptoms.   No hospitalizations or urgent care visits for cardiovascular reasons.   He has been compliant with his medical therapy.    Physical endurance remains stable, no structured exercise but remains independent, limited by back pain.  Home blood pressures are well-controlled. Patient and wife considering starting GLP-1 agonist to help with weight loss. Compliant with medications, endorses no issues.    Review of Systems: .   Review of Systems  Cardiovascular:  Negative for chest pain,  claudication, irregular heartbeat, leg swelling, near-syncope, orthopnea, palpitations, paroxysmal nocturnal dyspnea and syncope.  Respiratory:  Negative for shortness of breath.   Hematologic/Lymphatic: Negative for bleeding problem.    Studies Reviewed:   EKG Interpretation Date/Time:  Wednesday July 28 2024 14:07:20 EST Ventricular Rate:  69 PR Interval:  242 QRS Duration:  90 QT Interval:  382 QTC Calculation: 409 R Axis:   -8  Text Interpretation: Sinus rhythm with 1st degree A-V block with Premature atrial complexes Minimal voltage criteria for LVH, may be normal variant ( R in aVL ) Cannot rule out Inferior infarct (cited on or before 11-May-2020) When compared with ECG of 11-May-2020 15:04, Premature atrial complexes are now Present PR interval has increased Confirmed by Large Madonna [phone]) on 07/28/2024 2:22:15 PM   Echocardiogram: 09/04/2022:  Normal LV systolic function with visual EF 60-65%. Left ventricle cavity is normal in size. Normal left ventricular wall thickness. Normal global wall motion. Doppler evidence of grade II (pseudonormal) diastolic dysfunction, elevated LAP.  Mild tricuspid regurgitation. Mild pulmonary hypertension. RVSP measures 37 mmHg.  Proximal ascending aorta upper limit of normal, 37 mm.  Compared to 05/06/2020 otherwise no significant change.    Stress Testing: Lexiscan /modified Bruce Tetrofosmin stress test 02/23/2020: Stress EKG showed sinus tachycardia, inferolateral T wave inversion.  SPECT images show small sized, medium intensity, mid to basal inferolateral perfusion defect with mild reversibility. Stress LVEF 82%. Low risk study.   Coronary CTA 04/26/2020: 1. Coronary calcium  score of 1101. This was 89th percentile for age and sex matched control. 2. Normal coronary origin with right dominance. 3.  Minimal calcified plaque within the left main artery. Moderate  to severe stenosis within the proximal/mid LAD segment to calcified  plaque. Severe stenosis at the ostial segment of the 1st diagonal branch due calcified plaque. Severe stenosis within the proximal segment due to eccentric calcified plaque within the LCX. Moderate stenosis within the distal RCA due to calcified plaque. Of note, severity of the stenosis may be overestimated due to blooming artifact caused by calcified plaque. 4. CADRADS = 4. Cardiac catheterization or CT FFR is recommended. Consider symptom-guided anti-ischemic pharmacotherapy as well as risk factor modification per guideline directed care. Invasive coronary angiography recommended with revascularization per published guideline statements. 4. Study is sent for CT-FFR findings will be performed and reported    CT-FFR 04/27/2020: CT FFR analysis showed no significant stenosis.   Heart Catheterization: Left Heart Catheterization 05/08/20:  Hyperdynamic LVEF, EF 65 to 70%.  Mildly elevated LV EDP. Mild diffuse coronary artery disease with mild coronary calcification involving LAD, circumflex and dominant RCA.  LAD is very small and gives origin to a very large LAD: D1 which reaches the apex. Recommendation: Patient symptoms are related to acute diastolic heart failure with elevated LVEDP.  RADIOLOGY: NA  Risk Assessment/Calculations:   NA   Labs:       Latest Ref Rng & Units 04/29/2024   11:36 AM 12/25/2023   11:29 AM 08/25/2023    9:53 AM  CBC  WBC 4.0 - 10.5 K/uL 10.2  8.9  6.8   Hemoglobin 13.0 - 17.0 g/dL 85.7  86.2  87.1   Hematocrit 39.0 - 52.0 % 44.4  42.1  41.5   Platelets 150 - 400 K/uL 230  250  249        Latest Ref Rng & Units 04/29/2024   11:36 AM 04/23/2023   11:19 AM 12/20/2022    1:09 PM  BMP  Glucose 70 - 99 mg/dL 865  896  876   BUN 8 - 23 mg/dL 17  19  15    Creatinine 0.61 - 1.24 mg/dL 8.98  9.00  9.08   Sodium 135 - 145 mmol/L 141  140  139   Potassium 3.5 - 5.1 mmol/L 4.4  3.7  4.4   Chloride 98 - 111 mmol/L 105  106  105   CO2 22 - 32 mmol/L 21  25  24     Calcium  8.9 - 10.3 mg/dL 89.8  9.2  89.8       Latest Ref Rng & Units 04/29/2024   11:36 AM 04/23/2023   11:19 AM 12/20/2022    1:09 PM  CMP  Glucose 70 - 99 mg/dL 865  896  876   BUN 8 - 23 mg/dL 17  19  15    Creatinine 0.61 - 1.24 mg/dL 8.98  9.00  9.08   Sodium 135 - 145 mmol/L 141  140  139   Potassium 3.5 - 5.1 mmol/L 4.4  3.7  4.4   Chloride 98 - 111 mmol/L 105  106  105   CO2 22 - 32 mmol/L 21  25  24    Calcium  8.9 - 10.3 mg/dL 89.8  9.2  89.8   Total Protein 6.5 - 8.1 g/dL 7.0  6.6  7.4   Total Bilirubin 0.0 - 1.2 mg/dL 0.6  0.4  0.5   Alkaline Phos 38 - 126 U/L 122  108  95   AST 15 - 41 U/L 15  9  13    ALT 0 - 44 U/L 12  6  11      Lab Results  Component Value Date   CHOL 122 08/30/2022   HDL 63 08/30/2022   LDLCALC 41 08/30/2022   LDLDIRECT 48 08/30/2022   TRIG 99 08/30/2022   No results for input(s): LIPOA in the last 8760 hours. No components found for: NTPROBNP No results for input(s): PROBNP in the last 8760 hours. No results for input(s): TSH in the last 8760 hours.   Physical Exam:    Today's Vitals   07/28/24 1409  BP: (!) 120/58  Pulse: 67  Resp: 16  SpO2: 97%  Weight: 238 lb 12.8 oz (108.3 kg)  Height: 5' 6 (1.676 m)   Body mass index is 38.54 kg/m. Wt Readings from Last 3 Encounters:  07/28/24 238 lb 12.8 oz (108.3 kg)  04/29/24 236 lb (107 kg)  01/16/24 223 lb (101.2 kg)    Physical Exam  Constitutional: No distress.  Age appropriate, hemodynamically stable.   Neck: No JVD present.  Cardiovascular: Normal rate, regular rhythm, S1 normal, S2 normal, intact distal pulses and normal pulses. Exam reveals no gallop, no S3 and no S4.  No murmur heard. Pulmonary/Chest: Effort normal and breath sounds normal. No stridor. He has no wheezes. He has no rales.  Abdominal: Soft. Bowel sounds are normal. He exhibits no distension. There is no abdominal tenderness.  Abdominal obesity  Musculoskeletal:        General: No edema.      Cervical back: Neck supple.  Neurological: He is alert and oriented to person, place, and time. He has intact cranial nerves (2-12).  Skin: Skin is warm and moist.    Impression & Recommendation(s):  Impression:   ICD-10-CM   1. Chronic heart failure with preserved ejection fraction (HFpEF) (HCC)  I50.32 EKG 12-Lead    ECHOCARDIOGRAM COMPLETE    2. Nonobstructive atherosclerosis of coronary artery  I25.10     3. Mixed hyperlipidemia  E78.2     4. Benign hypertension  I10     5. Obstructive sleep apnea  G47.33        Recommendation(s):  Chronic heart failure with preserved ejection fraction (HFpEF) (HCC) Stage C, NYHA class II Hospitalizations for congestive heart failure since last office visit. Weight remains relatively stable for the last 1 year. Continue Entresto  49/51 mg p.o. twice daily. Continue Jardiance  10 mg p.o. daily Patient was considering starting GLP-1 agonist, he asked for my opinion given his comorbidities he would benefit from pharmacological therapy to help facilitate weight loss as long as there is no absolute contraindications.  I have asked him to discuss this further with PCP or consider weight loss clinic as he will need close follow-up for uptitration of medical therapy  Nonobstructive atherosclerosis of coronary artery Denies anginal chest pain. EKG nonischemic. Currently on aspirin  81 mg p.o. daily. Continue Lipitor  40 mg p.o. daily Will repeat echocardiogram prior to the next office visit to reevaluate LVEF Reemphasized the importance of secondary prevention with focus on improving the modifiable cardiovascular risk factors such as glycemic control, lipid management, blood pressure control, weight loss.  Mixed hyperlipidemia Continue atorvastatin  40 mg p.o. nightly. Recommended that he have repeat labs with PCP. LDL 39 mg/dL as of December 2024  Benign hypertension Office and home blood pressures are well-controlled. Medications as mentioned  above  Obstructive sleep apnea Reports compliance with his CPAP.  Orders Placed:  Orders Placed This Encounter  Procedures   EKG 12-Lead   ECHOCARDIOGRAM COMPLETE    Standing Status:   Future    Expected Date:   06/09/2025  Expiration Date:   09/07/2025    Where should this test be performed[facility]    Does the patient weigh less than or greater than 250 lbs?:   Patient weighs less than 250 lbs    Perflutren DEFINITY (image enhancing agent) should be administered unless hypersensitivity or allergy exist:   Administer Perflutren    Reason for exam-Echo:   Other-Full Diagnosis List    Full ICD-10/Reason for Exam:   (HFpEF) heart failure with preserved ejection fraction (HCC) [[id number]]   Final Medication List:   No orders of the defined types were placed in this encounter.   Medications Discontinued During This Encounter  Medication Reason   pregabalin (LYRICA) 75 MG capsule Patient Preference     Current Outpatient Medications:    abiraterone  acetate (ZYTIGA ) 250 MG tablet, Take 4 tablets (1,000 mg total) by mouth daily. Take on an empty stomach 1 hour before or 2 hours after a meal, Disp: 120 tablet, Rfl: 2   aspirin  EC 81 MG tablet, Take 1 tablet (81 mg total) by mouth daily., Disp: 90 tablet, Rfl: 3   atorvastatin  (LIPITOR ) 40 MG tablet, TAKE ONE TABLET BY MOUTH (40 MG total) DAILY, Disp: 30 tablet, Rfl: 0   diltiazem  (CARDIZEM  CD) 360 MG 24 hr capsule, TAKE ONE CAPSULE BY MOUTH ONCE DAILY, Disp: 90 capsule, Rfl: PRN   empagliflozin  (JARDIANCE ) 10 MG TABS tablet, Take 1 tablet (10 mg total) by mouth daily before breakfast., Disp: 90 tablet, Rfl: 3   HYDROcodone -acetaminophen  (NORCO/VICODIN) 5-325 MG tablet, Take 1 tablet by mouth every 4 (four) hours as needed., Disp: , Rfl:    lidocaine  (LIDODERM ) 5 %, Place 1 patch onto the skin as needed., Disp: , Rfl:    predniSONE  (DELTASONE ) 5 MG tablet, TAKE 1 Tablet BY MOUTH ONCE DAILY with BREAKFAST, Disp: 90 tablet, Rfl:  3   sacubitril -valsartan  (ENTRESTO ) 49-51 MG, Take 1 tablet by mouth 2 (two) times daily., Disp: 180 tablet, Rfl: 0   solifenacin  (VESICARE ) 5 MG tablet, Take 5 mg by mouth daily., Disp: , Rfl:    tamsulosin  (FLOMAX ) 0.4 MG CAPS capsule, Take 1 capsule (0.4 mg total) by mouth daily after supper., Disp: 30 capsule, Rfl: 0  Consent:   N/A  Disposition:   1 year follow-up sooner if needed   Signed, Madonna Michele HAS, [facility]  A Division of [facility] [address], [place], [place] [address]  07/28/2024 7:20 PM

## 2024-07-28 NOTE — Patient Instructions (Signed)
 Medication Instructions:  Your physician recommends that you continue on your current medications as directed. Please refer to the Current Medication list given to you today.  *If you need a refill on your cardiac medications before your next appointment, please call your pharmacy*  Lab Work: None ordered If you have labs (blood work) drawn today and your tests are completely normal, you will receive your results only by: MyChart Message (if you have MyChart) OR A paper copy in the mail If you have any lab test that is abnormal or we need to change your treatment, we will call you to review the results.  Testing/Procedures: ECHOCARDIOGRAM in OCT 2026  Follow-Up: At [facility], you and your health needs are our priority.  As part of our continuing mission to provide you with exceptional heart care, our providers are all part of one team.  This team includes your primary Cardiologist (physician) and Advanced Practice Providers or APPs (Physician Assistants and Nurse Practitioners) who all work together to provide you with the care you need, when you need it.  Your next appointment:   1 year(s)  Provider:   Madonna Large, DO    We recommend signing up for the patient portal called MyChart.  Sign up information is provided on this After Visit Summary.  MyChart is used to connect with patients for Virtual Visits (Telemedicine).  Patients are able to view lab/test results, encounter notes, upcoming appointments, etc.  Non-urgent messages can be sent to your provider as well.   To learn more about what you can do with MyChart, go to [email or web address].

## 2024-08-08 ENCOUNTER — Other Ambulatory Visit: Payer: Self-pay | Admitting: Cardiology

## 2024-08-08 DIAGNOSIS — R0609 Other forms of dyspnea: Secondary | ICD-10-CM

## 2024-08-08 DIAGNOSIS — I5032 Chronic diastolic (congestive) heart failure: Secondary | ICD-10-CM

## 2024-08-09 ENCOUNTER — Telehealth: Payer: Self-pay | Admitting: Radiation Oncology

## 2024-08-09 NOTE — Telephone Encounter (Signed)
 Was asked by [facility] to call patient to give estimate for radiation therapy treatment.  I called and left him a voicemail that his out of pocket max remaining for his insurance is [phone] and that is what he would have to pay towards treatment.

## 2024-08-12 ENCOUNTER — Other Ambulatory Visit: Payer: Self-pay | Admitting: Cardiology

## 2024-08-17 ENCOUNTER — Other Ambulatory Visit (HOSPITAL_COMMUNITY): Payer: Self-pay

## 2024-08-18 ENCOUNTER — Other Ambulatory Visit: Payer: Self-pay | Admitting: Cardiology

## 2024-08-20 ENCOUNTER — Other Ambulatory Visit: Payer: Self-pay

## 2024-08-20 MED ORDER — SACUBITRIL-VALSARTAN 49-51 MG PO TABS: 1.0000 | ORAL_TABLET | Freq: Two times a day (BID) | ORAL | 3 refills | Status: AC

## 2024-08-24 ENCOUNTER — Other Ambulatory Visit (HOSPITAL_COMMUNITY): Payer: Self-pay

## 2024-08-24 ENCOUNTER — Other Ambulatory Visit: Payer: Self-pay

## 2024-08-24 NOTE — Progress Notes (Signed)
 Specialty Pharmacy Refill Coordination Note  Bobby [name]. is a 73 y.o. male contacted today regarding refills of specialty medication(s) Abiraterone  Acetate (ZYTIGA )   Patient requested Delivery   Delivery date: 08/27/24   Verified address: [address] [place] [address]   Medication will be filled on: 08/26/24

## 2024-08-26 ENCOUNTER — Inpatient Hospital Stay: Attending: Oncology | Admitting: Oncology

## 2024-08-26 ENCOUNTER — Inpatient Hospital Stay

## 2024-08-26 ENCOUNTER — Ambulatory Visit

## 2024-08-26 VITALS — BP 136/68 | HR 65 | Temp 98.0°F | Resp 16 | Wt 239.2 lb

## 2024-08-26 DIAGNOSIS — C61 Malignant neoplasm of prostate: Secondary | ICD-10-CM | POA: Diagnosis not present

## 2024-08-26 DIAGNOSIS — D509 Iron deficiency anemia, unspecified: Secondary | ICD-10-CM

## 2024-08-26 DIAGNOSIS — Z5111 Encounter for antineoplastic chemotherapy: Secondary | ICD-10-CM | POA: Insufficient documentation

## 2024-08-26 DIAGNOSIS — C775 Secondary and unspecified malignant neoplasm of intrapelvic lymph nodes: Secondary | ICD-10-CM | POA: Insufficient documentation

## 2024-08-26 LAB — CMP (CANCER CENTER ONLY)
ALT: 10 U/L (ref 0–44)
AST: 15 U/L (ref 15–41)
Albumin: 4.6 g/dL (ref 3.5–5.0)
Alkaline Phosphatase: 113 U/L (ref 38–126)
Anion gap: 12 (ref 5–15)
BUN: 18 mg/dL (ref 8–23)
CO2: 23 mmol/L (ref 22–32)
Calcium: 10.1 mg/dL (ref 8.9–10.3)
Chloride: 104 mmol/L (ref 98–111)
Creatinine: 0.94 mg/dL (ref 0.61–1.24)
GFR, Estimated: >60 mL/min (ref >60)
Glucose, Bld: 119 mg/dL — ABNORMAL HIGH (ref 70–99)
Potassium: 4.2 mmol/L (ref 3.5–5.1)
Sodium: 140 mmol/L (ref 135–145)
Total Bilirubin: 0.5 mg/dL (ref 0.0–1.2)
Total Protein: 7.3 g/dL (ref 6.5–8.1)

## 2024-08-26 LAB — CBC WITH DIFFERENTIAL (CANCER CENTER ONLY)
Abs Immature Granulocytes: 0.04 K/uL (ref 0.00–0.07)
Basophils Absolute: 0 K/uL (ref 0.0–0.1)
Basophils Relative: 0 %
Eosinophils Absolute: 0.1 K/uL (ref 0.0–0.5)
Eosinophils Relative: 1 %
HCT: 44.4 % (ref 39.0–52.0)
Hemoglobin: 14.3 g/dL (ref 13.0–17.0)
Immature Granulocytes: 0 %
Lymphocytes Relative: 7 %
Lymphs Abs: 0.8 K/uL (ref 0.7–4.0)
MCH: 26.2 pg (ref 26.0–34.0)
MCHC: 32.2 g/dL (ref 30.0–36.0)
MCV: 81.5 fL (ref 80.0–100.0)
Monocytes Absolute: 0.4 K/uL (ref 0.1–1.0)
Monocytes Relative: 4 %
Neutro Abs: 9 K/uL — ABNORMAL HIGH (ref 1.7–7.7)
Neutrophils Relative %: 88 %
Platelet Count: 236 K/uL (ref 150–400)
RBC: 5.45 MIL/uL (ref 4.22–5.81)
RDW: 17.4 % — ABNORMAL HIGH (ref 11.5–15.5)
WBC Count: 10.4 K/uL (ref 4.0–10.5)
nRBC: 0 % (ref 0.0–0.2)

## 2024-08-26 LAB — FERRITIN: Ferritin: 24 ng/mL (ref 24–336)

## 2024-08-26 MED ORDER — LEUPROLIDE ACETATE (4 MONTH) 30 MG ~~LOC~~ KIT
30.0000 mg | PACK | Freq: Once | SUBCUTANEOUS | Status: AC
  Administered 2024-08-26: 13:00:00 30 mg via SUBCUTANEOUS
  Filled 2024-08-26: qty 30

## 2024-08-26 NOTE — Progress Notes (Signed)
°  [facility] OFFICE PROGRESS NOTE   Diagnosis: Prostate cancer  INTERVAL HISTORY:   Mr. Meckel returns as scheduled.  He continues abiraterone  and every 75-month leuprolide .  No hot flashes.  He saw Dr. Patrcia and plans to complete radiation to the prostate and pelvic lymph nodes.  He does not wish to begin this treatment until May or June of next year. He has a good appetite.  He continues to have back pain. He did not see gastroenterology to evaluate the iron deficiency.  He is not taking iron. Objective:  Vital signs in last 24 hours:  Blood pressure 136/68, pulse 65, temperature 98 F (36.7 C), temperature source Temporal, resp. rate 16, weight 239 lb 3.2 oz (108.5 kg), SpO2 98%.    Lymphatics: No cervical, supraclavicular, axillary, or inguinal nodes Resp: Lungs clear bilaterally Cardio: Regular rate and rhythm GI: No hepatosplenomegaly Vascular: No leg edema   Lab Results:  Lab Results  Component Value Date   WBC 10.2 04/29/2024   HGB 14.2 04/29/2024   HCT 44.4 04/29/2024   MCV 79.4 (L) 04/29/2024   PLT 230 04/29/2024   NEUTROABS 8.9 (H) 04/29/2024    CMP  Lab Results  Component Value Date   NA 141 04/29/2024   K 4.4 04/29/2024   CL 105 04/29/2024   CO2 21 (L) 04/29/2024   GLUCOSE 134 (H) 04/29/2024   BUN 17 04/29/2024   CREATININE 1.01 04/29/2024   CALCIUM  10.1 04/29/2024   PROT 7.0 04/29/2024   ALBUMIN 4.4 04/29/2024   AST 15 04/29/2024   ALT 12 04/29/2024   ALKPHOS 122 04/29/2024   BILITOT 0.6 04/29/2024   GFRNONAA >60 04/29/2024   GFRAA 81 09/29/2020    No results found for: CEA1, CEA, CAN199, CA125  Lab Results  Component Value Date   INR 1.0 05/06/2020   LABPROT 13.1 05/06/2020    Imaging:  No results found.  Medications: I have reviewed the patient's current medications.   Assessment/Plan: Prostate cancer-Gleason 4+5 = 9, PSA 14.15 April 2017 Negative bone scan September 2018 CT September  2018-diverticulitis in the sigmoid colon, enlarged retroperitoneal and pelvic lymph nodes Evaluated in the multidisciplinary prostate cancer clinic and considered to have metastatic disease, radiation not recommended Started treatment with leuprolide  and abiraterone /prednisone  (October 2018) CT abdomen/pelvis 12/20/2021- acute sigmoid diverticulitis, no adenopathy 01/15/2024: PSMA PET-no evidence of abnormal tracer uptake  2.  CHF 3.  Hyperlipidemia 4.  Lumbar disc disease 5.  Iron deficiency anemia August 2023 Low ferritin 12/20/2022, 02/05/2023 01/20/2023-stool Hemoccult cards negative Colonoscopy 07/09/2018-polyps removed from the ascending and transverse colon-tubular adenomas 6.  History of Kidney stones 7.  Hypertension 8.  L4-S1 laminectomy and fusion 03/03/2023        Disposition: Bobby [name] has a history of prostate cancer.  He has been maintained on leuprolide  and abiraterone  since October 2018.  There is no clinical evidence of disease progression.  He saw Dr. Patrcia and plans to initiate radiation to the prostate/pelvic lymph nodes in May or June of next year.  He will then taper off of antiandrogen therapy.  We will check a CBC and ferritin today.  He will return for an office visit and leuprolide  in 4 months.  Arley Hof, MD  08/26/2024  12:52 PM

## 2024-08-26 NOTE — Patient Instructions (Signed)
 [facility] - A DEPT OF [facility]  Discharge Instructions: Thank you for choosing [facility] to provide your oncology and hematology care.   If you have a lab appointment with the Cancer Center, please go directly to the Cancer Center and check in at the registration area.   Wear comfortable clothing and clothing appropriate for easy access to any Portacath or PICC line.   We strive to give you quality time with your provider. You may need to reschedule your appointment if you arrive late (15 or more minutes).  Arriving late affects you and other patients whose appointments are after yours.  Also, if you miss three or more appointments without notifying the office, you may be dismissed from the clinic at the providers discretion.      For prescription refill requests, have your pharmacy contact our office and allow 72 hours for refills to be completed.    Today you received the following Eligard  injection.  Leuprolide  Suspension for Injection (Prostate Cancer) What is this medication? LEUPROLIDE  (loo PROE lide) reduces the symptoms of prostate cancer. It works by decreasing levels of the hormone testosterone  in the body. This prevents prostate cancer cells from spreading or growing. This medicine may be used for other purposes; ask your health care provider or pharmacist if you have questions. COMMON BRAND NAME(S): Eligard , Lupron  Depot, Lutrate Depot  What should I tell my care team before I take this medication? They need to know if you have any of these conditions: Diabetes Heart disease Heart failure High or low levels of electrolytes, such as magnesium , potassium, or sodium in your blood Irregular heartbeat or rhythm Seizures An unusual or allergic reaction to leuprolide , other medications, foods, dyes, or preservatives Pregnant or trying to get pregnant Breast-feeding How should I use this medication? This medication is injected  under the skin or into a muscle. It is given by your care team in a hospital or clinic setting. Talk to your care team about the use of this medication in children. Special care may be needed. Overdosage: If you think you have taken too much of this medicine contact a poison control center or emergency room at once. NOTE: This medicine is only for you. Do not share this medicine with others. What if I miss a dose? Keep appointments for follow-up doses. It is important not to miss your dose. Call your care team if you are unable to keep an appointment. What may interact with this medication? Do not take this medication with any of the following: Cisapride Dronedarone Ketoconazole Levoketoconazole Pimozide Thioridazine This medication may also interact with the following: Other medications that cause heart rhythm changes This list may not describe all possible interactions. Give your health care provider a list of all the medicines, herbs, non-prescription drugs, or dietary supplements you use. Also tell them if you smoke, drink alcohol, or use illegal drugs. Some items may interact with your medicine. What should I watch for while using this medication? Your condition will be monitored carefully while you are receiving this medication. Tell your care team if your symptoms do not start to get better or if they get worse. Heart attacks and strokes have been reported with the use of this medication. Get emergency help if you develop signs or symptoms of a heart attack or stroke. Talk to your care team about the risks and benefits of this medication. This medication may cause serious skin reactions. They can happen weeks to  months after starting the medication. Talk to your care team right away if you have fevers or flu-like symptoms with a rash. The rash may be red or purple and then turn into blisters or peeling of the skin. Or you might notice a red rash with swelling of the face, lips, or lymph  nodes in your neck or under your arms. This medication may increase blood sugar. The risk may be higher in patients who already have diabetes. Ask your care team what you can do to lower your risk of diabetes while taking this medication. This medication may cause infertility. Talk to your care team if you are concerned about your fertility. What side effects may I notice from receiving this medication? Side effects that you should report to your care team as soon as possible: Allergic reactions--skin rash, itching, hives, swelling of the face, lips, tongue, or throat Heart attack--pain or tightness in the chest, shoulders, arms, or jaw, nausea, shortness of breath, cold or clammy skin, feeling faint or lightheaded Heart rhythm changes--fast or irregular heartbeat, dizziness, feeling faint or lightheaded, chest pain, trouble breathing High blood sugar (hyperglycemia)--increased thirst or amount of urine, unusual weakness or fatigue, blurry vision New or worsening seizures Redness, blistering, peeling, or loosening of the skin, including inside the mouth Stroke--sudden numbness or weakness of the face, arm, or leg, trouble speaking, confusion, trouble walking, loss of balance or coordination, dizziness, severe headache, change in vision Swelling and pain of the tumor site or lymph nodes Side effects that usually do not require medical attention (report these to your care team if they continue or are bothersome): Change in sex drive or performance Hot flashes Joint pain Pain, redness, or irritation at injection site Swelling of the ankles, hands, or feet Unusual weakness or fatigue This list may not describe all possible side effects. Call your doctor for medical advice about side effects. You may report side effects to FDA at 1-800-FDA-1088. Where should I keep my medication? This medication is given in a hospital or clinic. It will not be stored at home. NOTE: This sheet is a summary. It may  not cover all possible information. If you have questions about this medicine, talk to your doctor, pharmacist, or health care provider.  2025 Elsevier/Gold Standard (2023-11-24 00:00:00)   To help prevent nausea and vomiting after your treatment, we encourage you to take your nausea medication as directed.  BELOW ARE SYMPTOMS THAT SHOULD BE REPORTED IMMEDIATELY: *FEVER GREATER THAN 100.4 F (38 C) OR HIGHER *CHILLS OR SWEATING *NAUSEA AND VOMITING THAT IS NOT CONTROLLED WITH YOUR NAUSEA MEDICATION *UNUSUAL SHORTNESS OF BREATH *UNUSUAL BRUISING OR BLEEDING *URINARY PROBLEMS (pain or burning when urinating, or frequent urination) *BOWEL PROBLEMS (unusual diarrhea, constipation, pain near the anus) TENDERNESS IN MOUTH AND THROAT WITH OR WITHOUT PRESENCE OF ULCERS (sore throat, sores in mouth, or a toothache) UNUSUAL RASH, SWELLING OR PAIN  UNUSUAL VAGINAL DISCHARGE OR ITCHING   Items with * indicate a potential emergency and should be followed up as soon as possible or go to the Emergency Department if any problems should occur.  Please show the CHEMOTHERAPY ALERT CARD or IMMUNOTHERAPY ALERT CARD at check-in to the Emergency Department and triage nurse.  Should you have questions after your visit or need to cancel or reschedule your appointment, please contact [facility] - A DEPT OF [facility]  Dept: [phone]  and follow the prompts.  Office hours are 8:00 a.m. to 4:30 p.m. Monday -  Friday. Please note that voicemails left after 4:00 p.m. may not be returned until the following business day.  We are closed weekends and major holidays. You have access to a nurse at all times for urgent questions. Please call the main number to the clinic Dept: [phone] and follow the prompts.   For any non-urgent questions, you may also contact your provider using MyChart. We now offer e-Visits for anyone 74 and older to request care online for non-urgent symptoms. For  details visit [email or web address].   Also download the MyChart app! Go to the app store, search MyChart, open the app, select [facility], and log in with your MyChart username and password.

## 2024-08-26 NOTE — Progress Notes (Signed)
 Patient presents today for Eligard  injection. Patient tolerated injection in right upper abd with no complaints voiced.  Site clean and dry with no bruising or swelling noted.  No complaints of pain.  Discharged with vital signs stable and no signs or symptoms of distress noted.

## 2024-08-27 ENCOUNTER — Other Ambulatory Visit: Payer: Self-pay

## 2024-08-27 LAB — PROSTATE-SPECIFIC AG, SERUM (LABCORP): Prostate Specific Ag, Serum: <0.1 ng/mL (ref 0.0–4.0)

## 2024-09-03 ENCOUNTER — Other Ambulatory Visit: Payer: Self-pay

## 2024-09-04 ENCOUNTER — Emergency Department (HOSPITAL_COMMUNITY)
Admission: EM | Admit: 2024-09-04 | Discharge: 2024-09-04 | Disposition: A | Discharge status: Home / Self Care | Attending: Emergency Medicine | Admitting: Emergency Medicine

## 2024-09-04 ENCOUNTER — Emergency Department (HOSPITAL_COMMUNITY)

## 2024-09-04 ENCOUNTER — Other Ambulatory Visit: Payer: Self-pay

## 2024-09-04 ENCOUNTER — Encounter (HOSPITAL_COMMUNITY): Payer: Self-pay | Admitting: Emergency Medicine

## 2024-09-04 DIAGNOSIS — Z79899 Other long term (current) drug therapy: Secondary | ICD-10-CM | POA: Diagnosis not present

## 2024-09-04 DIAGNOSIS — S6992XA Unspecified injury of left wrist, hand and finger(s), initial encounter: Secondary | ICD-10-CM | POA: Diagnosis present

## 2024-09-04 DIAGNOSIS — S0081XA Abrasion of other part of head, initial encounter: Secondary | ICD-10-CM | POA: Diagnosis not present

## 2024-09-04 DIAGNOSIS — I509 Heart failure, unspecified: Secondary | ICD-10-CM | POA: Insufficient documentation

## 2024-09-04 DIAGNOSIS — S62112A Displaced fracture of triquetrum [cuneiform] bone, left wrist, initial encounter for closed fracture: Secondary | ICD-10-CM | POA: Insufficient documentation

## 2024-09-04 DIAGNOSIS — I110 Hypertensive heart disease with heart failure: Secondary | ICD-10-CM | POA: Diagnosis not present

## 2024-09-04 DIAGNOSIS — W01190A Fall on same level from slipping, tripping and stumbling with subsequent striking against furniture, initial encounter: Secondary | ICD-10-CM | POA: Diagnosis not present

## 2024-09-04 DIAGNOSIS — I2510 Atherosclerotic heart disease of native coronary artery without angina pectoris: Secondary | ICD-10-CM | POA: Diagnosis not present

## 2024-09-04 DIAGNOSIS — R519 Headache, unspecified: Secondary | ICD-10-CM | POA: Insufficient documentation

## 2024-09-04 DIAGNOSIS — Z7982 Long term (current) use of aspirin: Secondary | ICD-10-CM | POA: Insufficient documentation

## 2024-09-04 MED ORDER — HYDROCODONE-ACETAMINOPHEN 5-325 MG PO TABS: 1.0000 | ORAL_TABLET | ORAL | 0 refills | Status: DC | PRN

## 2024-09-04 MED ORDER — HYDROCODONE-ACETAMINOPHEN 5-325 MG PO TABS: 1.0000 | ORAL_TABLET | ORAL | 0 refills | Status: AC | PRN

## 2024-09-04 MED ORDER — HYDROCODONE-ACETAMINOPHEN 5-325 MG PO TABS
1.0000 | ORAL_TABLET | Freq: Once | ORAL | Status: AC
  Administered 2024-09-04: 1 via ORAL
  Filled 2024-09-04: qty 1

## 2024-09-04 NOTE — ED Provider Notes (Signed)
 " [place] EMERGENCY DEPARTMENT AT [facility] Provider Note   CSN: [id number] Arrival date & time: 09/04/24  1035     Patient presents with: Wrist Pain  HPI Bobby Valent. is a 73 y.o. male with CAD, CHF, hypertension, HLD presenting for fall.  Occurred yesterday while chasing his grandson.  States he tripped and fell forward with his left arm outstretched.  Also states he hit his head on the furniture.  Has a abrasion to the right forehead.  Endorsing left wrist pain primarily.  He states he has noticed more swelling and pain is primarily about the lateral aspect of the wrist.  Denies weakness or numbness in his fingers.  Denies any open wounds.    Wrist Pain       Prior to Admission medications  Medication Sig Start Date End Date Taking? Authorizing Provider  abiraterone  acetate (ZYTIGA ) 250 MG tablet Take 4 tablets (1,000 mg total) by mouth daily. Take on an empty stomach 1 hour before or 2 hours after a meal 06/09/24   Cloretta Arley NOVAK, MD  aspirin  EC 81 MG tablet Take 1 tablet (81 mg total) by mouth daily. 01/21/20   Tolia, Sunit, DO  atorvastatin  (LIPITOR ) 40 MG tablet Take 1 tablet (40 mg total) by mouth daily. 08/12/24   Tolia, Sunit, DO  diltiazem  (CARDIZEM  CD) 360 MG 24 hr capsule Take 1 capsule (360 mg total) by mouth daily. 08/12/24   Tolia, Sunit, DO  empagliflozin  (JARDIANCE ) 10 MG TABS tablet Take 1 tablet (10 mg total) by mouth daily before breakfast. 08/06/21   Tolia, Sunit, DO  HYDROcodone -acetaminophen  (NORCO/VICODIN) 5-325 MG tablet Take 1 tablet by mouth every 4 (four) hours as needed for up to 6 doses. 09/04/24   Cecila Satcher K, PA-C  lidocaine  (LIDODERM ) 5 % Place 1 patch onto the skin as needed. 12/14/21   [provider]  predniSONE  (DELTASONE ) 5 MG tablet TAKE 1 Tablet BY MOUTH ONCE DAILY with BREAKFAST 05/27/24   Cloretta Arley NOVAK, MD  sacubitril -valsartan  (ENTRESTO ) 49-51 MG Take 1 tablet by mouth 2 (two) times daily. 08/20/24    Tolia, Sunit, DO  solifenacin (VESICARE) 5 MG tablet Take 5 mg by mouth daily. 04/03/20   [provider]  tamsulosin  (FLOMAX ) 0.4 MG CAPS capsule Take 1 capsule (0.4 mg total) by mouth daily after supper. 04/17/17   Ottelin, Mark, MD    Allergies: Patient has no known allergies.    Review of Systems See HPI   Physical Exam   Vitals:   09/04/24 1054  BP: (!) 141/72  Pulse: 68  Resp: 16  Temp: 98.4 F (36.9 C)  SpO2: 97%    CONSTITUTIONAL:  well-appearing, NAD NEURO:  Alert and oriented x 3, CN 3-12 grossly intact Head: Small abrasion to the right forehead, no raccoon eyes Battle sign or rhinorrhea EYES:  eyes equal and reactive ENT/NECK:  Supple, no stridor, atraumatic,  no posterior or midline tenderness CARDIO:  regular rate and rhythm, appears well-perfused, radial pulses 2+ bilaterally PULM:  No respiratory distress, CTAB GI/GU:  non-distended MSK/SPINE:  No gross deformities, moving all extremities.  Edema noted in the left wrist generally, there is tenderness in the lateral aspect of the wrist joint.  No open wounds or obvious deformity. SKIN:  no rash, atraumatic   *Additional and/or pertinent findings included in MDM below    (all labs ordered are listed, but only abnormal results are displayed) Labs Reviewed - No data to display  EKG:  None  Radiology: CT Head Wo Contrast Result Date: 09/04/2024 EXAM: CT HEAD WITHOUT CONTRAST 09/04/2024 11:41:46 AM TECHNIQUE: CT of the head was performed without the administration of intravenous contrast. Automated exposure control, iterative reconstruction, and/or weight based adjustment of the mA/kV was utilized to reduce the radiation dose to as low as reasonably achievable. COMPARISON: None available. CLINICAL HISTORY: Head trauma, minor (Age >= 65y). FINDINGS: BRAIN AND VENTRICLES: No acute hemorrhage. No evidence of acute infarct. No hydrocephalus. No extra-axial collection. No mass effect or midline shift. Calcified  atherosclerotic plaque within cavernous/supraclinoid internal carotid arteries. ORBITS: No acute abnormality. SINUSES: No acute abnormality. SOFT TISSUES AND SKULL: No acute soft tissue abnormality. No skull fracture. IMPRESSION: 1. No acute intracranial abnormality. Electronically signed by: Donnice Mania MD 09/04/2024 11:59 AM EST RP Workstation: [id number]   DG Wrist Complete Left Result Date: 09/04/2024 EXAM: 3 OR MORE VIEW(S) XRAY OF THE LEFT WRIST 09/04/2024 11:35:32 AM COMPARISON: None available. CLINICAL HISTORY: fall FINDINGS: BONES AND JOINTS: Mildly displaced triquetral fracture. No malalignment. SOFT TISSUES: Dorsal soft tissue swelling. IMPRESSION: 1. Mildly displaced triquetral fracture. 2. Dorsal soft tissue swelling. Electronically signed by: Donnice Mania MD 09/04/2024 11:57 AM EST RP Workstation: [id number]   CT Cervical Spine Wo Contrast Result Date: 09/04/2024 EXAM: CT CERVICAL SPINE WITHOUT CONTRAST 09/04/2024 11:41:46 AM TECHNIQUE: CT of the cervical spine was performed without the administration of intravenous contrast. Multiplanar reformatted images are provided for review. Automated exposure control, iterative reconstruction, and/or weight based adjustment of the mA/kV was utilized to reduce the radiation dose to as low as reasonably achievable. COMPARISON: None available. CLINICAL HISTORY: Neck trauma (Age >= 65y) FINDINGS: BONES AND ALIGNMENT: No acute fracture or traumatic malalignment. DEGENERATIVE CHANGES: Mild degenerative endplate osteophytes at multiple levels. 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 Disc bulges at multiple levels. Disc osteophyte complex at C5-C6 eccentric to the right contributing to mild spinal canal stenosis. Facet arthrosis and uncovertebral hypertrophy at multiple levels. SOFT TISSUES: No prevertebral soft tissue swelling. IMPRESSION: 1. No evidence of acute traumatic injury. Electronically signed by:  Donnice Mania MD 09/04/2024 11:55 AM EST RP Workstation: [id number]     Procedures   Medications Ordered in the ED  HYDROcodone -acetaminophen  (NORCO/VICODIN) 5-325 MG per tablet 1 tablet (1 tablet Oral Given 09/04/24 1309)                                    Medical Decision Making Amount and/or Complexity of Data Reviewed Radiology: ordered.  Risk Prescription drug management.   73 year old well-appearing male presenting for left wrist pain after a fall yesterday.  Exam notable for abrasion to the right forehead and left wrist swelling and tenderness about the ulnar aspect.  Left wrist neurovascularly intact.  DDx includes skull fracture, ICH, subdural hematoma, wrist fracture or dislocation, other.  CT head and cervical spine were negative for acute injuries and evidence of trauma was minimal.  X-ray of the left wrist showed mildly displaced triquetral fracture.  Discussed patient with PA Heather Brought of hand surgery who advised short arm volar splint and hand follow-up.  Splint placed by [facility].  On reassessment pain was improved and wrist remained neurovascularly intact.  A few tablets of Norco to his pharmacy but otherwise recommended Tylenol  and ibuprofen.  Advised to follow-up with hand surgery.  Discussed return precautions. Discharged.     Final diagnoses:  Closed displaced fracture of triquetrum  of left wrist, initial encounter    ED Discharge Orders          Ordered    HYDROcodone -acetaminophen  (NORCO/VICODIN) 5-325 MG tablet  Every 4 hours PRN,   Status:  Discontinued        09/04/24 1322    HYDROcodone -acetaminophen  (NORCO/VICODIN) 5-325 MG tablet  Every 4 hours PRN        09/04/24 1323               Khalila Buechner K, PA-C 09/04/24 1323  "

## 2024-09-04 NOTE — ED Triage Notes (Signed)
 Pt POV for left wrist pain. Pt states he fell yesterday chasing grandson. Pt states he fell and hit right side of forehead and braced with left wrist. Denies N/V/D.

## 2024-09-04 NOTE — Progress Notes (Signed)
 Orthopedic Tech Progress Note Patient Details:  Bobby [name]. [birth year] [id number]  Ortho Devices Type of Ortho Device: Sling immobilizer, Post (short arm) splint Ortho Device/Splint Location: Volar LUE Ortho Device/Splint Interventions: Ordered, Application, Adjustment   Post Interventions Patient Tolerated: Well Instructions Provided: Care of device, Adjustment of device  Bobby [name] 09/04/2024, 1:05 PM

## 2024-09-04 NOTE — Discharge Instructions (Signed)
 Evaluation revealed that you have a left wrist triquetral fracture which is likely causing your symptoms.  Please maintain the wrist immobilized in the splint until you are evaluated by hand surgery.  Recommend that you call their office early Monday morning.  I sent a few tablets of Norco to your pharmacy but otherwise recommend Tylenol  and ibuprofen.  If you start to experience numbness or your hand becomes cold or any other concerning symptom please return to ED for further evaluation.

## 2024-09-07 ENCOUNTER — Other Ambulatory Visit (HOSPITAL_COMMUNITY): Payer: Self-pay

## 2024-09-07 ENCOUNTER — Other Ambulatory Visit: Payer: Self-pay

## 2024-09-07 NOTE — Progress Notes (Signed)
 Specialty Pharmacy Ongoing Clinical Assessment Note  Bill Theran Vandergrift. is a 73 y.o. male who is being followed by the specialty pharmacy service for RxSp Oncology   Patient's specialty medication(s) reviewed today: Abiraterone  Acetate (ZYTIGA )   Missed doses in the last 4 weeks: 0   Patient/Caregiver did not have any additional questions or concerns.   Therapeutic benefit summary: Patient is achieving benefit   Adverse events/side effects summary: No adverse events/side effects   Patient's therapy is appropriate to: Continue    Goals Addressed             This Visit's Progress    Slow Disease Progression   On track    Patient is on track. Patient will maintain adherence. PSA remains undetectable.          Follow up: 6 months  [facility]

## 2024-09-17 ENCOUNTER — Other Ambulatory Visit: Payer: Self-pay | Admitting: Oncology

## 2024-09-17 ENCOUNTER — Other Ambulatory Visit: Payer: Self-pay

## 2024-09-17 DIAGNOSIS — C61 Malignant neoplasm of prostate: Secondary | ICD-10-CM

## 2024-09-17 MED ORDER — ABIRATERONE ACETATE 250 MG PO TABS
1000.0000 mg | ORAL_TABLET | Freq: Every day | ORAL | 3 refills | Status: AC
  Filled 2024-09-20 – 2024-09-22 (×4): qty 120, 30d supply, fill #0
  Filled 2024-10-15: qty 120, 30d supply, fill #1

## 2024-09-20 ENCOUNTER — Other Ambulatory Visit: Payer: Self-pay | Admitting: Pharmacy Technician

## 2024-09-20 ENCOUNTER — Encounter: Payer: Self-pay | Admitting: Oncology

## 2024-09-20 ENCOUNTER — Other Ambulatory Visit: Payer: Self-pay

## 2024-09-21 ENCOUNTER — Other Ambulatory Visit: Payer: Self-pay

## 2024-09-21 NOTE — Progress Notes (Signed)
 Specialty Pharmacy Refill Coordination Note  Bobby Heid. is a 74 y.o. male contacted today regarding refills of specialty medication(s) Abiraterone  Acetate (ZYTIGA )   Patient requested Delivery   Delivery date: 09/24/24   Verified address: [address] [place] [address]   Medication will be filled on: 09/23/24  Patient wants to pay $84.00 cash price for zytiga  rather than $150 copay on insurance. Patient provided updated cc. Patient notified cash price does not contribute to insurance deductible. Advised patient look at mychart from [place] for additional information.

## 2024-09-22 ENCOUNTER — Telehealth: Payer: Self-pay | Admitting: Pharmacy Technician

## 2024-09-22 ENCOUNTER — Other Ambulatory Visit: Payer: Self-pay

## 2024-09-22 ENCOUNTER — Encounter: Payer: Self-pay | Admitting: Oncology

## 2024-09-22 ENCOUNTER — Other Ambulatory Visit (HOSPITAL_COMMUNITY): Payer: Self-pay

## 2024-09-22 NOTE — Telephone Encounter (Addendum)
 Oral Oncology Patient Advocate Encounter  Member ID: [id number]  After completing a benefits investigation, prior authorization for abieraterone is not required at this time through [facility].    Jeryl Wilbourn (Patty) Chet Burnet, CPhT  [facility], [place], [facility] - Oral Chemotherapy Patient Advocate Specialist III Phone: [phone]  Fax: [phone]

## 2024-09-22 NOTE — Telephone Encounter (Signed)
 Oral Oncology Patient Advocate Encounter  Was successful in securing patient a $6,000 grant from [facility] to provide copayment coverage for abiraterone .  This will keep the out of pocket expense at $0.     [employer] ID: [id number]   The billing information is as follows and has been shared with [facility].    RxBin: [id number] PCN: [id number] Member ID: [id number] Group ID: [id number] Dates of Eligibility: 08/15/2024 through 08/14/2025  Fund:  Prostate Cancer - Medicare Access  [place] (Patty) Chet Burnet, CPhT  [facility], [place], [facility] - Oral Chemotherapy Patient Advocate Specialist III Phone: [phone]  Fax: [phone]

## 2024-09-23 ENCOUNTER — Other Ambulatory Visit: Payer: Self-pay

## 2024-10-15 ENCOUNTER — Other Ambulatory Visit: Payer: Self-pay

## 2024-12-30 ENCOUNTER — Inpatient Hospital Stay

## 2024-12-30 ENCOUNTER — Inpatient Hospital Stay: Admitting: Oncology

## 2025-06-14 ENCOUNTER — Ambulatory Visit (HOSPITAL_COMMUNITY)
